# Patient Record
Sex: Male | Born: 1965 | Race: White | Hispanic: No | Marital: Married | Smoking: Light tobacco smoker
Health system: Southern US, Community
[De-identification: ages and names within clinical notes are randomized; demographics above are authoritative.]

## PROBLEM LIST (undated history)

## (undated) ENCOUNTER — Emergency Department: Admission: EM | Disposition: A | Discharge status: Still In Hospital | Payer: Self-pay

## (undated) DIAGNOSIS — E119 Type 2 diabetes mellitus without complications: Secondary | ICD-10-CM

## (undated) DIAGNOSIS — J302 Other seasonal allergic rhinitis: Secondary | ICD-10-CM

## (undated) DIAGNOSIS — N179 Acute kidney failure, unspecified: Secondary | ICD-10-CM

## (undated) DIAGNOSIS — Z452 Encounter for adjustment and management of vascular access device: Secondary | ICD-10-CM

## (undated) DIAGNOSIS — J329 Chronic sinusitis, unspecified: Secondary | ICD-10-CM

## (undated) DIAGNOSIS — G8929 Other chronic pain: Secondary | ICD-10-CM

## (undated) DIAGNOSIS — R519 Headache, unspecified: Secondary | ICD-10-CM

## (undated) DIAGNOSIS — E785 Hyperlipidemia, unspecified: Secondary | ICD-10-CM

## (undated) HISTORY — DX: Acute kidney failure, unspecified: N17.9

## (undated) HISTORY — DX: Type 2 diabetes mellitus without complications: E11.9

## (undated) HISTORY — DX: Headache, unspecified: R51.9

---

## 1997-10-27 DIAGNOSIS — Z8669 Personal history of other diseases of the nervous system and sense organs: Secondary | ICD-10-CM

## 1997-10-27 HISTORY — DX: Personal history of other diseases of the nervous system and sense organs: Z86.69

## 1998-10-27 DIAGNOSIS — G4730 Sleep apnea, unspecified: Secondary | ICD-10-CM

## 1998-10-27 HISTORY — DX: Sleep apnea, unspecified: G47.30

## 2013-09-30 ENCOUNTER — Other Ambulatory Visit: Payer: Self-pay

## 2013-09-30 ENCOUNTER — Inpatient Hospital Stay: Payer: BLUE CROSS/BLUE SHIELD | Admitting: Anesthesiology

## 2013-09-30 ENCOUNTER — Encounter: Payer: Self-pay | Admitting: Anesthesiology

## 2013-09-30 ENCOUNTER — Inpatient Hospital Stay: Payer: BLUE CROSS/BLUE SHIELD | Admitting: Critical Care Medicine

## 2013-09-30 ENCOUNTER — Encounter
Admission: AD | Disposition: A | Discharge status: Rehabilitation Facility | Payer: Self-pay | Source: Other Acute Inpatient Hospital | Attending: Critical Care Medicine

## 2013-09-30 ENCOUNTER — Ambulatory Visit: Payer: Self-pay

## 2013-09-30 ENCOUNTER — Ambulatory Visit: Payer: BLUE CROSS/BLUE SHIELD

## 2013-09-30 ENCOUNTER — Inpatient Hospital Stay
Admission: AD | Admit: 2013-09-30 | Discharge: 2013-10-07 | DRG: 023 | Disposition: A | Discharge status: Rehabilitation Facility | Payer: BLUE CROSS/BLUE SHIELD | Source: Other Acute Inpatient Hospital | Attending: Internal Medicine | Admitting: Internal Medicine

## 2013-09-30 DIAGNOSIS — G4733 Obstructive sleep apnea (adult) (pediatric): Secondary | ICD-10-CM | POA: Diagnosis present

## 2013-09-30 DIAGNOSIS — A4901 Methicillin susceptible Staphylococcus aureus infection, unspecified site: Secondary | ICD-10-CM | POA: Diagnosis present

## 2013-09-30 DIAGNOSIS — J322 Chronic ethmoidal sinusitis: Secondary | ICD-10-CM | POA: Diagnosis present

## 2013-09-30 DIAGNOSIS — G935 Compression of brain: Secondary | ICD-10-CM

## 2013-09-30 DIAGNOSIS — IMO0001 Reserved for inherently not codable concepts without codable children: Secondary | ICD-10-CM | POA: Diagnosis present

## 2013-09-30 DIAGNOSIS — G9340 Encephalopathy, unspecified: Secondary | ICD-10-CM

## 2013-09-30 DIAGNOSIS — E785 Hyperlipidemia, unspecified: Secondary | ICD-10-CM

## 2013-09-30 DIAGNOSIS — G936 Cerebral edema: Secondary | ICD-10-CM

## 2013-09-30 DIAGNOSIS — R001 Bradycardia, unspecified: Secondary | ICD-10-CM

## 2013-09-30 DIAGNOSIS — G911 Obstructive hydrocephalus: Secondary | ICD-10-CM | POA: Diagnosis present

## 2013-09-30 DIAGNOSIS — J0120 Acute ethmoidal sinusitis, unspecified: Secondary | ICD-10-CM | POA: Diagnosis present

## 2013-09-30 DIAGNOSIS — J9600 Acute respiratory failure, unspecified whether with hypoxia or hypercapnia: Secondary | ICD-10-CM

## 2013-09-30 DIAGNOSIS — R079 Chest pain, unspecified: Secondary | ICD-10-CM

## 2013-09-30 DIAGNOSIS — F17200 Nicotine dependence, unspecified, uncomplicated: Secondary | ICD-10-CM | POA: Diagnosis present

## 2013-09-30 DIAGNOSIS — J0121 Acute recurrent ethmoidal sinusitis: Secondary | ICD-10-CM

## 2013-09-30 DIAGNOSIS — G060 Intracranial abscess and granuloma: Principal | ICD-10-CM

## 2013-09-30 DIAGNOSIS — E119 Type 2 diabetes mellitus without complications: Secondary | ICD-10-CM

## 2013-09-30 DIAGNOSIS — Z880 Allergy status to penicillin: Secondary | ICD-10-CM

## 2013-09-30 DIAGNOSIS — G988 Other disorders of nervous system: Secondary | ICD-10-CM

## 2013-09-30 DIAGNOSIS — Z794 Long term (current) use of insulin: Secondary | ICD-10-CM

## 2013-09-30 HISTORY — DX: Other seasonal allergic rhinitis: J30.2

## 2013-09-30 HISTORY — PX: CRANIOTOMY, REMOVAL HEMATOMA, SUBDURAL: SHX3519

## 2013-09-30 HISTORY — DX: Type 2 diabetes mellitus without complications: E11.9

## 2013-09-30 LAB — URINALYSIS WITH MICROSCOPIC
Bilirubin, UA: NEGATIVE
Blood, UA: NEGATIVE
Glucose, UA: >1000 — AB
Ketones UA: 10
Leukocyte Esterase, UA: NEGATIVE
Nitrite, UA: NEGATIVE
Specific Gravity UA: >1.035 — AB (ref 1.001–1.035)
Urine pH: 7 (ref 5.0–8.0)
Urobilinogen, UA: NORMAL mg/dL

## 2013-09-30 LAB — CBC
Hematocrit: 44.8 % (ref 42.0–52.0)
Hgb: 15.1 g/dL (ref 13.0–17.0)
MCH: 29.8 pg (ref 28.0–32.0)
MCHC: 33.7 g/dL (ref 32.0–36.0)
MCV: 88.4 fL (ref 80.0–100.0)
MPV: 10.3 fL (ref 9.4–12.3)
Nucleated RBC: 0 (ref 0–1)
Platelets: 197 (ref 140–400)
RBC: 5.07 (ref 4.70–6.00)
RDW: 13 % (ref 12–15)
WBC: 15.92 — ABNORMAL HIGH (ref 3.50–10.80)

## 2013-09-30 LAB — CVOR1 AGNKC GL THGB CLWBL
Arterial Total CO2: 23.1 mEq/L — ABNORMAL LOW (ref 24.0–30.0)
Base Excess, Arterial: 0.1 mEq/L (ref <=2.0)
Calcium, Ionized: 2.28 mEq/L — ABNORMAL LOW (ref 2.30–2.58)
Chloride, WB: 107 mEq/L — ABNORMAL HIGH (ref 98–106)
HCO3, Arterial: 22.2 mEq/L — ABNORMAL LOW (ref 23.0–29.0)
Hematocrit Total Calculated: 48.6 % (ref 40.0–54.0)
Hemoglobin Total: 15.9 g/dL (ref 13.0–17.0)
O2 Sat, Arterial: 99.9 % (ref 95.0–100.0)
Temperature: 37
Whole Blood Glucose: 298 mg/dL — ABNORMAL HIGH (ref 70–100)
Whole Blood Potassium: 4.1 mEq/L (ref 3.5–5.3)
Whole Blood Sodium: 141 mEq/L (ref 136–146)
pCO2, Arterial: 30.5 — ABNORMAL LOW (ref 35.0–45.0)
pH, Arterial: 7.475 — ABNORMAL HIGH (ref 7.350–7.450)
pO2, Arterial: 258 — ABNORMAL HIGH (ref 80.0–90.0)

## 2013-09-30 LAB — BLOOD GAS, ARTERIAL
Arterial Total CO2: 21.5 mEq/L — ABNORMAL LOW (ref 24.0–30.0)
Base Excess, Arterial: 0 mEq/L (ref <=2.0)
FIO2: 100 %
HCO3, Arterial: 20.7 mEq/L — ABNORMAL LOW (ref 23.0–29.0)
Minute Ventilation: 12.7
O2 Sat, Arterial: 100 % (ref 95.0–100.0)
PEEP: 5
Pressure Control: 25
Rate: 18
Temperature: 37.5
Tidal vol.: 660
pCO2, Arterial: 25.3 — ABNORMAL LOW (ref 35.0–45.0)
pH, Arterial: 7.526 — ABNORMAL HIGH (ref 7.350–7.450)
pO2, Arterial: 271 — ABNORMAL HIGH (ref 80.0–90.0)

## 2013-09-30 LAB — BASIC METABOLIC PANEL
BUN: 24 mg/dL — ABNORMAL HIGH (ref 9.0–21.0)
CO2: 19 mEq/L — ABNORMAL LOW (ref 22–29)
Calcium: 9 mg/dL (ref 8.5–10.5)
Chloride: 101 mEq/L (ref 98–107)
Creatinine: 0.8 mg/dL (ref 0.7–1.3)
Glucose: 362 mg/dL — ABNORMAL HIGH (ref 70–100)
Potassium: 4.8 mEq/L (ref 3.5–5.1)
Sodium: 134 mEq/L — ABNORMAL LOW (ref 136–145)

## 2013-09-30 LAB — GLUCOSE WHOLE BLOOD - POCT
Whole Blood Glucose POCT: 316 mg/dL — ABNORMAL HIGH (ref 70–100)
Whole Blood Glucose POCT: 300 mg/dL — ABNORMAL HIGH (ref 70–100)

## 2013-09-30 LAB — CK: Creatine Kinase (CK): 54 U/L (ref 30–200)

## 2013-09-30 LAB — PT/INR
PT INR: 1 (ref 0.9–1.1)
PT: 12.9 (ref 12.6–15.0)

## 2013-09-30 LAB — TYPE AND SCREEN
AB Screen Gel: NEGATIVE
ABO Rh: A NEG

## 2013-09-30 LAB — TROPONIN I: Troponin I: 0.02 ng/mL (ref 0.00–0.09)

## 2013-09-30 LAB — GFR: EGFR: >60

## 2013-09-30 SURGERY — CRANIOTOMY, REMOVAL HEMATOMA, SUBDURAL
Anesthesia: Anesthesia General | Wound class: Dirty or Infected

## 2013-09-30 MED ORDER — LIDOCAINE-EPINEPHRINE 1 %-1:100000 IJ SOLN
INTRAMUSCULAR | Status: DC | PRN
Start: 2013-09-30 — End: 2013-09-30
  Administered 2013-09-30: 10 mL

## 2013-09-30 MED ORDER — SODIUM CHLORIDE 0.9 % IV SOLN
INTRAVENOUS | Status: DC | PRN
Start: 2013-09-30 — End: 2013-09-30

## 2013-09-30 MED ORDER — DEXAMETHASONE SODIUM PHOSPHATE 4 MG/ML IJ SOLN (WRAP)
6.0000 mg | Freq: Four times a day (QID) | INTRAMUSCULAR | Status: DC
Start: 2013-09-30 — End: 2013-10-02
  Administered 2013-09-30 – 2013-10-02 (×8): 6 mg via INTRAVENOUS
  Filled 2013-09-30 (×8): qty 2

## 2013-09-30 MED ORDER — VECURONIUM BROMIDE 10 MG IV SOLR
INTRAVENOUS | Status: DC | PRN
Start: 2013-09-30 — End: 2013-09-30
  Administered 2013-09-30 (×2): 10 mg via INTRAVENOUS

## 2013-09-30 MED ORDER — CHLORHEXIDINE GLUCONATE 0.12 % MT SOLN
15.0000 mL | Freq: Two times a day (BID) | OROMUCOSAL | Status: DC
Start: 2013-09-30 — End: 2013-10-03
  Administered 2013-10-01: 15 mL via OROMUCOSAL
  Filled 2013-09-30: qty 15

## 2013-09-30 MED ORDER — FENTANYL CITRATE 0.05 MG/ML IJ SOLN
INTRAMUSCULAR | Status: AC
Start: 2013-09-30 — End: ?
  Filled 2013-09-30: qty 4

## 2013-09-30 MED ORDER — SODIUM CHLORIDE 0.9 % IV SOLN
INTRAVENOUS | Status: DC
Start: 2013-09-30 — End: 2013-10-02

## 2013-09-30 MED ORDER — VECURONIUM BROMIDE 10 MG IV SOLR
INTRAVENOUS | Status: AC
Start: 2013-09-30 — End: ?
  Filled 2013-09-30: qty 10

## 2013-09-30 MED ORDER — SODIUM CHLORIDE 0.9 % IJ SOLN
INTRAMUSCULAR | Status: AC
Start: 2013-09-30 — End: ?
  Filled 2013-09-30: qty 10

## 2013-09-30 MED ORDER — SODIUM CHLORIDE 0.9 % IR SOLN
Status: DC | PRN
Start: 2013-09-30 — End: 2013-09-30
  Administered 2013-09-30: 2000 mL

## 2013-09-30 MED ORDER — PROPOFOL INFUSION 10 MG/ML
3.0000 mg/kg/h | INTRAVENOUS | Status: DC
Start: 2013-09-30 — End: 2013-10-01
  Administered 2013-10-01 (×2): 3 mg/kg/h via INTRAVENOUS
  Filled 2013-09-30 (×3): qty 100

## 2013-09-30 MED ORDER — SODIUM CHLORIDE 0.9 % IV MBP
2.0000 g | Freq: Three times a day (TID) | INTRAVENOUS | Status: DC
Start: 2013-09-30 — End: 2013-10-03
  Administered 2013-09-30 – 2013-10-03 (×8): 2 g via INTRAVENOUS
  Filled 2013-09-30 (×10): qty 2

## 2013-09-30 MED ORDER — SIMVASTATIN 40 MG PO TABS
40.0000 mg | ORAL_TABLET | Freq: Every day | ORAL | Status: DC
Start: 2013-09-30 — End: 2013-10-07
  Administered 2013-09-30 – 2013-10-07 (×8): 40 mg via OROGASTRIC
  Filled 2013-09-30 (×8): qty 1

## 2013-09-30 MED ORDER — VANCOMYCIN HCL 1000 MG IV SOLR
2250.0000 mg | Freq: Two times a day (BID) | INTRAVENOUS | Status: DC
Start: 2013-09-30 — End: 2013-10-03
  Administered 2013-09-30 – 2013-10-02 (×5): 2250 mg via INTRAVENOUS
  Filled 2013-09-30 (×6): qty 2250

## 2013-09-30 MED ORDER — SODIUM CHLORIDE 0.9 % IV SOLN
0.0000 [IU]/h | INTRAVENOUS | Status: DC
Start: 2013-09-30 — End: 2013-10-01
  Administered 2013-09-30 – 2013-10-01 (×2): 4 [IU]/h via INTRAVENOUS
  Administered 2013-10-01: 6 [IU]/h via INTRAVENOUS
  Filled 2013-09-30 (×2): qty 1

## 2013-09-30 MED ORDER — BACITRACIN 50000 UNITS IM SOLR
INTRAMUSCULAR | Status: DC | PRN
Start: 2013-09-30 — End: 2013-09-30
  Administered 2013-09-30: 50000 [IU]

## 2013-09-30 MED ORDER — PROPOFOL 10 MG/ML IV EMUL
INTRAVENOUS | Status: AC
Start: 2013-09-30 — End: ?
  Filled 2013-09-30: qty 100

## 2013-09-30 MED ORDER — PROPOFOL 10 MG/ML IV EMUL
INTRAVENOUS | Status: AC
Start: 2013-09-30 — End: 2013-09-30
  Administered 2013-09-30: 2.999 mg/kg/h via INTRAVENOUS
  Filled 2013-09-30: qty 100

## 2013-09-30 MED ORDER — ONDANSETRON HCL 4 MG/2ML IJ SOLN
4.0000 mg | Freq: Four times a day (QID) | INTRAMUSCULAR | Status: DC | PRN
Start: 2013-09-30 — End: 2013-10-07

## 2013-09-30 MED ORDER — NICARDIPINE IV BOLUS SYRINGE (ANESTHESIA)
INTRAVENOUS | Status: AC
Start: 2013-09-30 — End: ?
  Filled 2013-09-30: qty 5

## 2013-09-30 MED ORDER — AZTREONAM 1 G IJ SOLR
1.0000 g | Freq: Three times a day (TID) | INTRAMUSCULAR | Status: DC
Start: 2013-09-30 — End: 2013-09-30

## 2013-09-30 MED ORDER — DEXTROSE 50 % IV SOLN
50.0000 mL | INTRAVENOUS | Status: DC | PRN
Start: 2013-09-30 — End: 2013-10-07

## 2013-09-30 MED ORDER — VANCOMYCIN 1250 MG IN 250 ML NS (CNR)
1250.0000 mg | Freq: Two times a day (BID) | Status: DC
Start: 2013-09-30 — End: 2013-09-30

## 2013-09-30 MED ORDER — INSULIN REGULAR HUMAN 100 UNIT/ML IJ SOLN
0.0000 [IU] | Freq: Once | INTRAMUSCULAR | Status: AC
Start: 2013-09-30 — End: 2013-09-30
  Administered 2013-09-30: 5 [IU] via INTRAVENOUS
  Filled 2013-09-30: qty 10

## 2013-09-30 MED ORDER — METRONIDAZOLE IN NACL 500 MG/100 ML IV SOLN
500.0000 mg | Freq: Three times a day (TID) | INTRAVENOUS | Status: DC
Start: 2013-09-30 — End: 2013-10-03
  Administered 2013-09-30 – 2013-10-03 (×8): 500 mg via INTRAVENOUS
  Filled 2013-09-30 (×7): qty 100

## 2013-09-30 MED ORDER — FENTANYL CITRATE 0.05 MG/ML IJ SOLN
50.0000 ug | INTRAMUSCULAR | Status: DC | PRN
Start: 2013-09-30 — End: 2013-10-01
  Administered 2013-09-30 (×2): 50 ug via INTRAVENOUS
  Administered 2013-09-30: 100 ug via INTRAVENOUS
  Administered 2013-09-30 (×4): 50 ug via INTRAVENOUS

## 2013-09-30 MED ORDER — GELATIN ABSORBABLE 100 EX MISC
CUTANEOUS | Status: DC | PRN
Start: 2013-09-30 — End: 2013-09-30
  Administered 2013-09-30: 1 via TOPICAL

## 2013-09-30 MED ORDER — MICROFIBRILLAR COLL HEMOSTAT EX PADS
MEDICATED_PAD | CUTANEOUS | Status: DC | PRN
Start: 2013-09-30 — End: 2013-09-30
  Administered 2013-09-30: 1 via TOPICAL

## 2013-09-30 MED ORDER — PROPOFOL INFUSION 10 MG/ML
INTRAVENOUS | Status: DC | PRN
Start: 2013-09-30 — End: 2013-09-30
  Administered 2013-09-30: 50 mg via INTRAVENOUS

## 2013-09-30 MED ORDER — LUBRIFRESH P.M. OP OINT
TOPICAL_OINTMENT | OPHTHALMIC | Status: AC
Start: 2013-09-30 — End: ?
  Filled 2013-09-30: qty 3.5

## 2013-09-30 MED ORDER — THROMBIN 5000 UNITS EX SOLR
CUTANEOUS | Status: DC | PRN
Start: 2013-09-30 — End: 2013-09-30
  Administered 2013-09-30: 5000 [IU] via TOPICAL

## 2013-09-30 MED ORDER — PHENYLEPHRINE 100 MCG/ML IV BOLUS (ANESTHESIA)
PREFILLED_SYRINGE | INTRAVENOUS | Status: AC
Start: 2013-09-30 — End: ?
  Filled 2013-09-30: qty 5

## 2013-09-30 MED ORDER — PROPOFOL 10 MG/ML IV EMUL
INTRAVENOUS | Status: AC
Start: 2013-09-30 — End: ?
  Filled 2013-09-30: qty 20

## 2013-09-30 SURGICAL SUPPLY — 51 items
BLADE SURGICAL DISPOSABLE (Blade) ×2 IMPLANT
CATH BARDEX FOLEY 2WAY 16FR (Catheter Urine) ×2 IMPLANT
CLIP EXTERNAL CARTRIDGE SERRATE EDGE JAW (Clips) ×3
CLIP EXTERNAL CARTRIDGE SERRATE EDGE JAW HEAVY CLAMP FORCE ACRACLIP (Clips) ×3 IMPLANT
CLIP XTRN ACRACLIP STRL CRTDG SRR EDG (Clips) ×3
CONTAINER SPEC 8OZ NS SNPON LID TRNLU (Suction) ×2 IMPLANT
COTTON BALL STERILE .75IN (Dressing) ×2 IMPLANT
COTTONOID 1/2X1" 80-1402" (Dressing) ×2 IMPLANT
COTTONOID 1X1" 80-1403" (Dressing) ×2 IMPLANT
COVER BUR HL TI MATRIXNEURO 12MM CRNL (Cover) ×4 IMPLANT
COVER BURR HOLE CRANIAL OD12 MM SYNTHES (Cover) ×4 IMPLANT
COVER BURR HOLE CRANIAL OD12 MM TITANIUM BLUE (Cover) ×4 IMPLANT
DRAPE LIECA MICROSCOPE 54X150 (Drape) ×2 IMPLANT
DRESSING FLEXZAN 4X4 (Dressing) ×2 IMPLANT
ELECTRODE BLADE (Cautery) ×2 IMPLANT
ELECTRODE ELECTROSURGICAL BLADE L4 IN (Cautery) ×1
ELECTRODE ELECTROSURGICAL BLADE L4 IN OD3/32 IN EDGE L.2 IN INSULATE (Cautery) ×1 IMPLANT
ELECTRODE ESURG BLDE EDG 3/32IN 4IN LF (Cautery) ×1
GAUZE KERLIX 4.5X4YDS (Dressing) ×2 IMPLANT
GLOVE BIOGEL SURG PF SZ 8.5 (Glove) ×4 IMPLANT
GLOVE SRG NTR RBR 8 INDCTR BGL 299X103MM (Glove) ×2
GLOVE SURGICAL 8 INDICATOR BIOGEL POWDER (Glove) ×2
GLOVE SURGICAL 8 INDICATOR BIOGEL POWDER FREE SMOOTH BEAD CUFF (Glove) ×2 IMPLANT
GOWN SMART IMPERVIOUS LARGE (Gown) ×4 IMPLANT
KIT CRANIOTOMY FFX (Tray) ×2 IMPLANT
NEEDLE 25GA 1 1/2 (Needles) ×2 IMPLANT
NEEDLE REG BEVEL 19GX1.5IN (Needles) ×6 IMPLANT
PAD ELECTROSRG GRND REM W CRD (Procedure Accessories) ×2 IMPLANT
PATTIES 1X3 USE LAWSON 2442 (Sponge) ×2 IMPLANT
PATTIES COTTONOID 1/2X2 (Sponge) ×2 IMPLANT
SCREW BN TI MATRIXNEURO 1.5MM 2.5MM 4MM (Screw) ×20 IMPLANT
SCREW L4 MM OD1.5 MM ODSEC2.5 MM (Screw) ×20 IMPLANT
SCREW L4 MM OD1.5 MM ODSEC2.5 MM TITANIUM CRANIOMAXILLOFACIAL SELF (Screw) ×20 IMPLANT
SLEEVE SEQUEN COMP KNEE REG (Procedure Accessories) ×2 IMPLANT
SOL NACL INJ 0.9% 30ML BACTER (IV Solutions) ×2 IMPLANT
SOLUTION IRR 0.9% NACL 1000ML LF STRL (Irrigation Solutions) ×1
SOLUTION IRRIGATION 0.9% SODIUM CHLORIDE (Irrigation Solutions) ×1
SOLUTION IRRIGATION 0.9% SODIUM CHLORIDE 1000 ML PLASTIC POUR BOTTLE (Irrigation Solutions) ×1 IMPLANT
SPNG ABSORBABLE GELATIN (Hemostat) ×2 IMPLANT
SPONGE GAUZE 12PLY STRL 4X4IN (Sponge) ×2 IMPLANT
SUTURE ETHILON BLACK 3-0 PS-1 L18 IN (Suture) ×2
SUTURE ETHILON BLACK 3-0 PS-1 L18 IN MONOFILAMENT NONABSORBABLE (Suture) ×2 IMPLANT
SUTURE NABSB 3-0 PS1 ETH MTPS 18IN MFL (Suture) ×2
SUTURE VICRYL 3-0 CP2 8X18IN (Suture) ×12 IMPLANT
SYRINGE 20 ML BD LUER-LOK MEDICAL (Syringes, Needles) ×2 IMPLANT
SYRINGE MED 20ML LL LF STRL (Syringes, Needles) ×4
TOOL DISSECTING L9 CM ACORN FLUTE OD7.5 (Burr) ×1
TOOL DISSECTING L9 CM ACORN FLUTE OD7.5 MM MIDAS REX LEGEND (Burr) ×1 IMPLANT
TOOL DSCT ACRN MDSRX LGND 7.5MM 9CM STRL (Burr) ×1
TOWEL STERILE REUSABLE 8PK (Procedure Accessories) ×4 IMPLANT
TRAY PH SKIN SCRUB GEL TRAY (Tray) ×2 IMPLANT

## 2013-09-30 NOTE — Brief Op Note (Signed)
NEUROSURGERY BRIEF OP NOTE    Date Time: 09/30/2013 11:13 PM    Patient Name:   [name] Brown    Date of Operation:   09/30/2013    Providers Performing:   Surgeon(s):  Madaline Savage, MD    Assistant (s):    Operative Procedure:   Procedure(s):  CRANIOTOMY, REMOVAL HEMATOMA, SUBDURAL    Preoperative Diagnosis:   Pre-Op Diagnosis Codes:     * Abscess of brain [324.0]    Postoperative Diagnosis:   * No post-op diagnosis entered *    Anesthesia:   General    Estimated Blood Loss:        Implants:     Implant Name Type Inv. Item Serial No. Manufacturer Lot No. LRB No. Used Action   SCREW MATRX SLF DRLG TITN - [id number] Screw SCREW MATRX SLF DRLG TITN  SYNTH[employer]  N/A 20 Implanted   BUR HL COVER TITN - [id number] Cover BUR HL COVER TITN   SYNTH[employer] TRAUMA   N/A 4 Implanted       Drains:   Drains: none    Specimens:   Cultures for bacterial, fungal, TB  and permanent sent     SPECIMENS (last 24 hours)      Pathology Specimens     Row Name 09/30/13 2200             Additional Information    Send final report to: J. Georgi Tuel     Specimen Information    Specimen Testing Required Routine Pathology     Specimen ID  [id number]     Specimen Description RIGHT FRONTAL BRAIN ABSCESS WALL         Findings:   Yellowish purulent fluid drained and cultured.    Complications:   none    Condition:   stable      Signed by: Madaline Savage, MD                                                                              [facility]

## 2013-09-30 NOTE — Anesthesia Preprocedure Evaluation (Addendum)
Anesthesia Evaluation    AIRWAY           CARDIOVASCULAR    cardiovascular exam normal, regular and normal       DENTAL           PULMONARY    pulmonary exam normal and clear to auscultation     OTHER FINDINGS    Allergies:   -- Morphine    -- Penicillins   All Rx:  Scheduled Meds:     Continuous Infusions:     PRN Meds:.     Problem List:  Patient Active Problem List    Brain abscess         Date Noted: 09/30/2013      History:  Past Medical History:    Seasonal allergic rhinitis                                    Diabetes mellitus                                           No past surgical history on file.  Labs    WBC      15.92   09/30/2013  HGB       15.1   09/30/2013  HCT       44.8   09/30/2013  PLT        197   09/30/2013  NA         134   09/30/2013  K          4.8   09/30/2013  CL         101   09/30/2013  CO2         19   09/30/2013  CREAT      0.8   09/30/2013  BUN       24.0   09/30/2013  PT        12.9   09/30/2013  INR        1.0   09/30/2013  GLU        362   09/30/2013      Enzo Bi, MD                    Anesthesia Plan    ASA 3     general                     intravenous and inhalational induction   Detailed anesthesia plan: general endotracheal  Monitors/Adjuncts: arterial line    Post Op: ICU      informed consent obtained    Plan discussed with CRNA.

## 2013-09-30 NOTE — Consults (Signed)
NEUROSURGERY CONSULTATION    Date Time: 09/30/2013 5:43 PM  Patient Name: [name] Brown  Requesting Physician: Dinescu, Doristine Bosworth, MD  Consulting Physician: Dr. Vivia Budge  Covered By: Neurosurgery PA/pager [phone]          Reason for Consultation:   Large right frontal abscess    History:   [name] Brown is a 47 y.o. unknown handed male who presents to the hospital on 09/30/2013 with a large right frontal abscess with history of multiple sinus surgeries and recent sinus infection per report. Patient presented to [facility] today with complaints of AMS x 1 day. HCT obtained shows a large right frontal abscess with ventricular effacement and 2 cm anterior leftward midline shift. Neurosurgical consultation requested.   Patient was intubated prior to transport, limited neuro exam. Wife is en route. History obtained per report and review of [facility] ED records.    Past Medical History:   No past medical history on file.    Past Surgical History:   No past surgical history on file.    Family History:   No family history on file.    Social History:     History     Social History   . Marital Status: N/A     Spouse Name: N/A     Number of Children: N/A   . Years of Education: N/A     Social History Main Topics   . Smoking status: Not on file   . Smokeless tobacco: Not on file   . Alcohol Use: Not on file   . Drug Use: Not on file   . Sexually Active: Not on file     Other Topics Concern   . Not on file     Social History Narrative   . No narrative on file       Allergies:   Penicillins    Medications:     Current Facility-Administered Medications   Medication Dose Route Frequency   . aztreonam  1 g Intramuscular Q8H SCH   . dexamethasone  6 mg Intravenous Q6H SCH   . metroNIDAZOLE  500 mg Intravenous Q8H SCH   . vancomycin  1,250 mg Intravenous Q12H       Review of Systems:   A comprehensive review of systems was: unobtainable    Physical Exam:     Filed Vitals:    09/30/13 1738   SpO2: 100%       Intake and Output Summary (Last 24  hours) at Date Time  No intake or output data in the 24 hours ending 09/30/13 1743      Neuro exam:  Intubated, propofol held for exam  No eye opening  PERRL 3R brisk  Following commands BUE: grip, thumbs up bilaterally  BUE brisk localization  BLE brisk TF  Spontaneous extensor posturing BUE noted  Toes down-going bilaterally  Neg Clonus      GCS: E1 V1-T M6 = 8-T       Labs Reviewed:     No results found for this basename: WBC, HGB, HCT, MCV, PLT     No results found for this basename: NA, K, CL, CO2     No results found for this basename: INR, PT       Rads:   HCT obtained at [facility] - Large right frontal lesion measuring 4.7 x 5.9 cm with large amount of air and fluid layering suggestive of abscess with history of acute-on-chronic sinusitis and multiple prior sinus surgeries, cerebral edema with  anterior leftward midline shift of 2 cm with lateral ventricular effacement and uncal herniation.    Assessment:   47 yo male presented with AMS x 1 day found to have a large right frontal lesion measuring 4.7 x 5.9 cm with large amount of air and fluid layering suggestive of abscess with history of acute-on-chronic sinusitis and multiple prior sinus surgeries, cerebral edema with anterior leftward midline shift of 2 cm with lateral ventricular effacement and uncal herniation.    Plan:   Patient has been started on Flagyl, Vancomycin, Aztreonam, Decadron, received Mannitol 100 mg per [facility]  Dr. Jaynie Collins is aware of patient, recs pending  Pre-op labs ordered in anticipation of OR  NPO      Signed by: Karma Lew PA-C  Date/Time: 09/30/2013 5:43 PM      Further history obtained per discussion with Wife  Recently moved here from [place]. Last sinus surgery was performed in [place] in February 2014 due to chronic sinusitis.  Patient has history of chronic headaches. Complained of severe headache last Saturday 09/24/13. They presented to ED at that time, presumed sinusitis, prescribed Abx, steroid and pain medication.  Headache had improved on Sunday. He returned to work on Tuesday. Has complained of lethargy since.  Yesterday morning he noted difficulty walking which became progressively worse. AMS noted this morning.    ID has consulted, Aztreonam discontinued, changed to Cefepime  Urgent ENT consult requested  Plan for OR likely tonight, appreciate ENT input, may perform surgery with abscess drainage in conjunction with ENT pending their recs  Pre-op labs have been reviewed, T&S pending  Dr. Jaynie Collins will come evaluate patient in NSICU  Fine cut facial CT with coronal recon    Melanie Crazier, PA-C

## 2013-09-30 NOTE — Consults (Signed)
oCONSULTATION    Date Time: 09/30/2013 6:22 PM  Patient Name: [name]  Requesting Physician: Dinescu, Doristine Bosworth, MD       Reason for Consultation:   Brain abscess    Problem List:   Acute Problem List:   Brain abscess; CT with large  Frontal brain abscess with midline shift  Hx multiple sinus surgeries craniotomy in 2003; 2008 for sinusitis with purulence  Allergy PCN but not known by wife what type    Chronic:  DM  HLD  Sleep apnea      Assessment:   Large brain abscess; will need drainage and cultures; Neurosurgery to see    Recommendations:   Adjust antibiotics; Continue Vancomycin but stop Aztreonam; Start Cefepime 2 gm IV Q 8 hr; Continue Flagyl  Await Neurosurgery recommendations; likely grainage tonight    D/W wife at bedside  D/ W Dr Jearld Shines in NSICU    _______________________________________________________________________  I have discussed my thoughts with the patient and with relevant medical team members I have considered the potential drug interactions between antimicrobial agents I have recommended and other medications required by the patient and adjusted appropriately for renal function   Thank you for allowing me to participate in the care of this very interesting and pleasant patient    History:   [name] Brown is a 47 y.o. male who presents to the hospital on 09/30/2013 with a large frontal brain abscess. He was transferred from [facility] where he presented witn change in mental status , headache.  transferred to [facility] for surgical management and more intensive care.    Past Medical History:   No past medical history on file.    Past Surgical History:   No past surgical history on file.    Family History:   No family history on file.    Social History:     History     Social History   . Marital Status: N/A     Spouse Name: N/A     Number of Children: N/A   . Years of Education: N/A     Social History Main Topics   . Smoking status: Not on file   . Smokeless tobacco: Not on file   . Alcohol  Use: Not on file   . Drug Use: Not on file   . Sexually Active: Not on file     Other Topics Concern   . Not on file     Social History Narrative   . No narrative on file       Allergies:     Allergies   Allergen Reactions   . Morphine    . Penicillins        Lines:   No central lines    Medications:     Current Facility-Administered Medications   Medication Dose Route Frequency   . aztreonam  1 g Intramuscular Q8H SCH   . chlorhexidine  15 mL Mouth/Throat Q12H SCH   . dexamethasone  6 mg Intravenous Q6H SCH   . insulin regular  0-7 Units Intravenous Once   . metroNIDAZOLE  500 mg Intravenous Q8H   . simvastatin  40 mg per OG tube Daily with dinner   . vancomycin  1,250 mg Intravenous Q12H       Antibiotics:   #1 Vancomycin 1.25 gm IV Q 12 hr  #1 Flagyl 500 mg IV Q 8 hr  #1 Aztreonam 1 gm Iv Q 8 hr stop  #1 Cefepime 2 gm Iv Q  8 hr    Review of Systems:   Intubated; cannot obtain most history from chart and wife  NG tube  Mainly Headache/ lethargy/ change in behavior  No reported diarhea or abd pain  No dysuria  No cough/ SOB  No visual changes        Physical Exam:     Filed Vitals:    09/30/13 1738   SpO2: 100%       General Appearance: intubated; sedated  \HEENT: PERRL  Neck: supple, no significant adenopathy   Lungs: clear to auscultation, no wheezes, rales or rhonchi, symmetric air entry   Cardiac: normal rate, regular rhythm, normal S1, S2,   Abdomen: soft, non-tender, non-distended, normal active bowel sounds  Extremities: no pedal edema  Skin: no rash    Labs/Microbiology Reviewed:   WBC 15.92  BUN/ Cr nl    Rads:   Radiological Procedure reviewed.     Signed by: Dorena Cookey, MD

## 2013-09-30 NOTE — H&P (Signed)
Patient Type: I     ATTENDING PHYSICIAN: Leonette Monarch, MD     HISTORY OF PRESENT ILLNESS:  This is a 47 year old male sent from [facility] for brain abscess.   The patient has a past medical history of diabetes, multiple episodes of  sinus infection and sinus surgery, hyperlipidemia, and obstructive sleep  apnea.  He has been having sinus infection for the last couple of weeks,  and he has been treated as an outpatient with antibiotics and steroids.   Today, he presented to the outside hospital with changes in mental status  that started one day prior.  The patient was complaining of frontal  headache, not feeling well, seemed very fatigued.  He started becoming  increasingly lethargic with decreased responsiveness.  There are no reports  of any seizure activity as per the chart review.  Since in hospital, the  patient was intubated for airway protection.  A stat head CT shows a large  4 x 5 cm collection into the right frontal lobe with vasogenic edema, mass  effect about 2 cm of midline shift.  There is prominence of the temporal  course suggesting early mild hydrocephalus.  Also there is subfalcine  herniation and medial temporal lobe prominent suggesting early uncal  herniation.  There is compression of the suprasellar cistern.  There are no  signs of downward herniation.  The facial bone CT shows bilateral acute  ethmoidal sinusitis and fibrosis and chronic changes of the nose and  ethmoid and sphenoid sinuses, status post multiple infections and prior  surgeries.  There is destruction of the lamina papyracea.  The patient was  transferred to [facility] for further management.  The [facility]  service was consulted over the phone by the ER physician from the outside  hospital.  Due to the CAT scan findings, 100 g of mannitol and moderate  hyperventilation were recommended.  The patient already had received 1 dose  of vancomycin and aztreonam at the outside hospital.  We recommended the  addition of  Flagyl for extended anaerobic coverage.  The patient has  allergy to PENICILLIN.  The patient was transferred intubated and stable  hemodynamically to [facility].     REVIEW OF SYSTEMS:  Unavailable as the patient is intubated and encephalopathic.     PAST MEDICAL HISTORY:  As above.     PAST SURGICAL HISTORY:  As above, plus multiple sinus surgeries and question of resection of  frontal lobe during one of those surgeries.     ALLERGIES:  MORPHINE and PENICILLIN.     MEDICATIONS AT HOME:  Glyburide, Zocor, Cipro, dexamethasone 4 mg by mouth every 6 hours and  Percocet.     FAMILY HISTORY:  Noncontributory.     SOCIAL HISTORY:  He lives at home, married, no history of smoking, alcohol abuse, or IV drug  use.     PHYSICAL EXAMINATION:  GENERAL:  The patient intubated, off sedation, encephalopathic.  VITAL SIGNS:  Reviewed and stable.  He saturates 100% on 100% FIO2.    NEUROLOGIC:  The patient is not opening eyes to pain.  Pupils 3 to 4 mm and  reactive, positive cough, positive gag, positive corneals.  The patient is  following 2-step commands intermittently.  He is brisk localizing with left  upper extremity and less brisk with the right upper extremity.  He  withdraws to pain bilateral lower extremities.  HEAD AND NECK:  Thick neck, intubated, no JVD.  CARDIOVASCULAR:  Regular S1,  S2, no clicks or murmurs.  LUNGS:  Clear to auscultation.  ABDOMEN:  Soft, nontender, obese, distended, positive bowel sounds.  EXTREMITIES:  No edema, clubbing, or cyanosis.  SKIN:  No obvious rashes.     LABORATORY DATA:  Outside head CT was personally reviewed and reported as above.  The blood  work at [facility] is not available.  From the blood work  performed at the outside hospital, white count is 22.5.  Sodium is 136.   There is normal renal function and blood sugar was 368.  Chest x-ray  performed at [facility] shows no active infiltrate.     IMPRESSION AND PLAN:  1.  Right frontal brain  abscess with large amount of brain edema and  brain compression with subfalcine herniation and early uncal herniation.   The patient is admitted to neurologic intensive care unit.  We will start  him on steroids, Decadron 6 mg intravenous every 6 hours. We are going to  continue the broad-spectrum antibiotics, vancomycin, aztreonam, and Flagyl.   We will obtain ID consult.  Neurosurgery service was already consulted and  is evaluating the patient for abscess drainage.  Based on neurosurgery  recommendations the patient will need ENT evaluation for drainage of the  sphenoidale sinusitis.  We will keep the sodium within normal limits.   Blood pressure: less than 160, temperature less than 99.  2.  Cardiovascular:  The patient is stable hemodynamically.  Start him on  intravenous fluids.  We are going to rule out neurocardiogenic injury.  For  his hyperlipidemia, I will restart statin.  3.  Acute respiratory failure.  Check ABG and provide mild hyperventilation  to avoid worsening of the mass effect.  The goal pCO2 would be 30 to 35  until OR.   4.  Gastrointestinal:  Nothing by mouth and prophylaxis.  5.  Renal: Recheck BMP, daily sodium checks, keep sodium within normal  limits.  6.  Hematologic:  Follow up white count.  Check coagulations preoperative.  7.  Diabetes, uncontrolled start insulin drip.  8.  Deep venous thrombosis prophylaxis:  Sequential compression devices,  hold low molecular weight heparin due to possible emergent OR.  We are  going to resume 24 to 48 hours after the surgery.  9.  Disposition:  Full code, neurologic ICU admission for brain abscess,  brain edema, brain compression and encephalopathy, acute respiratory  failure, uncontrolled diabetes, life saving interventions, antibiotics,  steroids, mechanical ventilation and insulin drip.     Critical care time spent for this patient 75 minutes, excluding procedures.           D:  09/30/2013 18:13 PM by Dr. Doristine Bosworth. [place] MD ([id number])  T:   09/30/2013 19:25 PM by       Everlean Cherry: [id number]) (Doc ID: [id number])

## 2013-09-30 NOTE — Transfer of Care (Signed)
Anesthesia Transfer of Care Note    Patient: [name] Brown    Procedures performed: Procedure(s) with comments:  CRANIOTOMY, REMOVAL HEMATOMA, SUBDURAL -  BI-FRONTAL CRANIOTOMY, FOR  EVACUATION OF RIGHT FRONTAL ABSCESS    Anesthesia type: General ETT    Patient location:ICU    Last vitals:   Filed Vitals:    09/30/13 2350   BP: 120/65   Pulse: 79   Temp:    Resp: 18   SpO2: 98%       Post pain: intubated and sedated     Mental Status:unresponsive    Respiratory Function: intubated    Cardiovascular: stable    Nausea/Vomiting: intubated and sedated    Hydration Status: adequate    Post assessment: no apparent anesthetic complications and no reportable events

## 2013-09-30 NOTE — Consults (Signed)
Date Time: 09/30/2013 8:43 PM   Patient Name: [facility]   Requesting Physician: Dinescu, Doristine Bosworth, MD   Consulting Physician: Dr. Vivia Budge   Covered By: Neurosurgery PA/pager [phone]   Reason for Consultation:    Large right frontal abscess   History:    Patient is a 47 y.o. unknown handed male who was transferred from [facility] with a suspected large right frontal abscess. He has a history of multiple sinus surgeries and recent sinus infection per report. Patient presented to [facility] today with complaints of AMS x 1 day. HCT obtained shows a large right frontal abscess with ventricular effacement and 2 cm anterior leftward midline shift. Neurosurgical consultation requested.   Patient was intubated prior to transport, limited neuro exam. Wife is en route. History obtained per report and review of [facility] ED records.   Past Medical History:    No past medical history on file.   Past Surgical History:    No past surgical history on file.   Family History:    No family history on file.   Social History:      History      Social History    .  Marital Status:  N/A      Spouse Name:  N/A      Number of Children:  N/A    .  Years of Education:  N/A      Social History Main Topics    .  Smoking status:  Not on file    .  Smokeless tobacco:  Not on file    .  Alcohol Use:  Not on file    .  Drug Use:  Not on file    .  Sexually Active:  Not on file      Other Topics  Concern    .  Not on file      Social History Narrative    .  No narrative on file      Allergies:    Penicillins   Medications:      Current Facility-Administered Medications    Medication  Dose  Route  Frequency    .  aztreonam  1 g  Intramuscular  Q8H SCH    .  dexamethasone  6 mg  Intravenous  Q6H SCH    .  metroNIDAZOLE  500 mg  Intravenous  Q8H SCH    .  vancomycin  1,250 mg  Intravenous  Q12H      Review of Systems:    A comprehensive review of systems was: unobtainable        O:   Filed Vitals:    09/30/13 2000   BP: 126/76   Pulse: 70   Temp: 99.5  F (37.5 C)   Resp: 18   SpO2: 100%         PHYSICAL EXAM:  Intubated, propofol held for exam   No eye opening   PERRL 3R brisk   Following commands BUE: grip, thumbs up bilaterally   BUE brisk localization   BLE brisk TF   Spontaneous extensor posturing BUE noted   Toes down-going bilaterally   Neg Clonus      Head CT shows R anterior frontal lesion with air/fluid level and surrounding edema concerning for abscess. No frontal sinus is seen with metallic artifact just above the nasion.        A/P:   Neuro exam is compromised by intubation/sedation but appears to be nonfocal.  Given concerning CT  findings, however, we will proceed to OR.  I discussed case with Dr. Cheyenne Adas who is on call for ENT.  He agreed with my plan to proceed to OR.   He will see patient in the morning.   Consent was obtained from his wife.       Rella Larve, MD

## 2013-10-01 ENCOUNTER — Inpatient Hospital Stay: Payer: BLUE CROSS/BLUE SHIELD

## 2013-10-01 ENCOUNTER — Encounter: Payer: Self-pay | Admitting: Neurological Surgery

## 2013-10-01 DIAGNOSIS — G060 Intracranial abscess and granuloma: Principal | ICD-10-CM

## 2013-10-01 LAB — BLOOD GAS, ARTERIAL
Arterial Total CO2: 20.5 mEq/L — ABNORMAL LOW (ref 24.0–30.0)
Arterial Total CO2: 22.5 mEq/L — ABNORMAL LOW (ref 24.0–30.0)
Base Excess, Arterial: -1.1 mEq/L (ref <=2.0)
Base Excess, Arterial: -1.8 mEq/L (ref <=2.0)
FIO2: 50 %
FIO2: 40 %
HCO3, Arterial: 19.8 mEq/L — ABNORMAL LOW (ref 23.0–29.0)
HCO3, Arterial: 21.5 mEq/L — ABNORMAL LOW (ref 23.0–29.0)
Minute Ventilation: 12.7
Minute Ventilation: 12.7
O2 Sat, Arterial: 99.4 % (ref 95.0–100.0)
O2 Sat, Arterial: 99.4 % (ref 95.0–100.0)
PEEP: 5
PEEP: 5
Pressure Control: 23
Pressure Control: 23
Pressure Support: 5
Rate: 12
Temperature: 37.6
Temperature: 36.9
Tidal vol.: 660
Tidal vol.: 660
pCO2, Arterial: 24.7 — ABNORMAL LOW (ref 35.0–45.0)
pCO2, Arterial: 33.7 — ABNORMAL LOW (ref 35.0–45.0)
pH, Arterial: 7.516 — ABNORMAL HIGH (ref 7.350–7.450)
pH, Arterial: 7.42 (ref 7.350–7.450)
pO2, Arterial: 137 — ABNORMAL HIGH (ref 80.0–90.0)
pO2, Arterial: 157 — ABNORMAL HIGH (ref 80.0–90.0)

## 2013-10-01 LAB — GLUCOSE WHOLE BLOOD - POCT
Whole Blood Glucose POCT: 121 mg/dL — ABNORMAL HIGH (ref 70–100)
Whole Blood Glucose POCT: 124 mg/dL — ABNORMAL HIGH (ref 70–100)
Whole Blood Glucose POCT: 129 mg/dL — ABNORMAL HIGH (ref 70–100)
Whole Blood Glucose POCT: 171 mg/dL — ABNORMAL HIGH (ref 70–100)
Whole Blood Glucose POCT: 191 mg/dL — ABNORMAL HIGH (ref 70–100)
Whole Blood Glucose POCT: 235 mg/dL — ABNORMAL HIGH (ref 70–100)
Whole Blood Glucose POCT: 252 mg/dL — ABNORMAL HIGH (ref 70–100)
Whole Blood Glucose POCT: 281 mg/dL — ABNORMAL HIGH (ref 70–100)
Whole Blood Glucose POCT: 311 mg/dL — ABNORMAL HIGH (ref 70–100)

## 2013-10-01 LAB — BASIC METABOLIC PANEL
BUN: 25 mg/dL — ABNORMAL HIGH (ref 9.0–21.0)
CO2: 20 mEq/L — ABNORMAL LOW (ref 22–29)
Calcium: 8.5 mg/dL (ref 8.5–10.5)
Chloride: 110 mEq/L — ABNORMAL HIGH (ref 98–107)
Creatinine: 0.7 mg/dL (ref 0.7–1.3)
Glucose: 138 mg/dL — ABNORMAL HIGH (ref 70–100)
Potassium: 3.8 mEq/L (ref 3.5–5.1)
Sodium: 144 mEq/L (ref 136–145)

## 2013-10-01 LAB — GFR: EGFR: >60

## 2013-10-01 LAB — CBC
Hematocrit: 43 % (ref 42.0–52.0)
Hgb: 14.3 g/dL (ref 13.0–17.0)
MCH: 29.7 pg (ref 28.0–32.0)
MCHC: 33.3 g/dL (ref 32.0–36.0)
MCV: 89.4 fL (ref 80.0–100.0)
MPV: 10.5 fL (ref 9.4–12.3)
Nucleated RBC: 0 (ref 0–1)
Platelets: 374 (ref 140–400)
RBC: 4.81 (ref 4.70–6.00)
RDW: 13 % (ref 12–15)
WBC: 22.03 — ABNORMAL HIGH (ref 3.50–10.80)

## 2013-10-01 MED ORDER — INSULIN REGULAR HUMAN 100 UNIT/ML IJ SOLN
1.0000 [IU] | INTRAMUSCULAR | Status: DC | PRN
Start: 2013-10-01 — End: 2013-10-07
  Administered 2013-10-01: 7 [IU] via SUBCUTANEOUS
  Administered 2013-10-01: 10 [IU] via SUBCUTANEOUS
  Administered 2013-10-01 – 2013-10-02 (×5): 7 [IU] via SUBCUTANEOUS
  Administered 2013-10-03 (×2): 4 [IU] via SUBCUTANEOUS
  Administered 2013-10-03: 7 [IU] via SUBCUTANEOUS
  Administered 2013-10-03: 10 [IU] via SUBCUTANEOUS
  Administered 2013-10-04 (×4): 4 [IU] via SUBCUTANEOUS
  Administered 2013-10-05: 7 [IU] via SUBCUTANEOUS
  Administered 2013-10-05 (×2): 4 [IU] via SUBCUTANEOUS
  Administered 2013-10-06: 10 [IU] via SUBCUTANEOUS
  Administered 2013-10-06: 4 [IU] via SUBCUTANEOUS
  Administered 2013-10-06: 7 [IU] via SUBCUTANEOUS
  Administered 2013-10-06: 4 [IU] via SUBCUTANEOUS
  Filled 2013-10-01: qty 40
  Filled 2013-10-01 (×2): qty 70
  Filled 2013-10-01: qty 10
  Filled 2013-10-01: qty 100
  Filled 2013-10-01: qty 40
  Filled 2013-10-01: qty 70
  Filled 2013-10-01: qty 10
  Filled 2013-10-01: qty 40
  Filled 2013-10-01: qty 70
  Filled 2013-10-01: qty 40
  Filled 2013-10-01 (×2): qty 70
  Filled 2013-10-01: qty 40
  Filled 2013-10-01: qty 70
  Filled 2013-10-01: qty 10
  Filled 2013-10-01: qty 70
  Filled 2013-10-01: qty 40

## 2013-10-01 MED ORDER — GLUCOSE 40 % PO GEL
15.0000 g | ORAL | Status: DC | PRN
Start: 2013-10-01 — End: 2013-10-07

## 2013-10-01 MED ORDER — SENNOSIDES-DOCUSATE SODIUM 8.6-50 MG PO TABS
2.0000 | ORAL_TABLET | Freq: Every evening | ORAL | Status: DC
Start: 2013-10-01 — End: 2013-10-07
  Administered 2013-10-01 – 2013-10-06 (×6): 2 via ORAL
  Filled 2013-10-01 (×6): qty 2

## 2013-10-01 MED ORDER — HYDROMORPHONE HCL PF 1 MG/ML IJ SOLN
1.0000 mg | INTRAMUSCULAR | Status: DC | PRN
Start: 2013-10-01 — End: 2013-10-04
  Administered 2013-10-01 – 2013-10-04 (×15): 1 mg via INTRAVENOUS
  Filled 2013-10-01 (×15): qty 1

## 2013-10-01 MED ORDER — FUROSEMIDE 10 MG/ML IJ SOLN
20.0000 mg | Freq: Once | INTRAMUSCULAR | Status: AC
Start: 2013-10-01 — End: 2013-10-01

## 2013-10-01 MED ORDER — HYDROMORPHONE HCL PF 1 MG/ML IJ SOLN
0.5000 mg | INTRAMUSCULAR | Status: DC | PRN
Start: 2013-10-01 — End: 2013-10-01
  Administered 2013-10-01: 0.5 mg via INTRAVENOUS
  Filled 2013-10-01: qty 1

## 2013-10-01 MED ORDER — FENTANYL CITRATE 0.05 MG/ML IJ SOLN
50.0000 ug | INTRAMUSCULAR | Status: DC | PRN
Start: 2013-10-01 — End: 2013-10-02
  Administered 2013-10-01 (×7): 50 ug via INTRAVENOUS
  Filled 2013-10-01 (×7): qty 2

## 2013-10-01 MED ORDER — FAMOTIDINE 10 MG/ML IV SOLN (WRAP)
20.0000 mg | Freq: Two times a day (BID) | INTRAVENOUS | Status: DC
Start: 2013-10-01 — End: 2013-10-03
  Administered 2013-10-01 – 2013-10-02 (×5): 20 mg via INTRAVENOUS
  Filled 2013-10-01 (×5): qty 2

## 2013-10-01 MED ORDER — DEXTROSE 50 % IV SOLN
25.0000 mL | INTRAVENOUS | Status: DC | PRN
Start: 2013-10-01 — End: 2013-10-07

## 2013-10-01 MED ORDER — SALINE SPRAY 0.65 % NA SOLN
2.0000 | NASAL | Status: DC | PRN
Start: 2013-10-01 — End: 2013-10-07
  Filled 2013-10-01: qty 44

## 2013-10-01 MED ORDER — FUROSEMIDE 10 MG/ML IJ SOLN
INTRAMUSCULAR | Status: AC
Start: 2013-10-01 — End: 2013-10-01
  Administered 2013-10-01: 20 mg via INTRAVENOUS
  Filled 2013-10-01: qty 4

## 2013-10-01 MED ORDER — GLUCAGON HCL (RDNA) 1 MG IJ SOLR
1.0000 mg | INTRAMUSCULAR | Status: DC | PRN
Start: 2013-10-01 — End: 2013-10-07

## 2013-10-01 MED ORDER — HYDROMORPHONE HCL 1 MG/ML PO LIQD
1.0000 mg | Freq: Once | ORAL | Status: AC
Start: 2013-10-01 — End: 2013-10-01
  Administered 2013-10-01: 1 mg via ORAL

## 2013-10-01 NOTE — Plan of Care (Signed)
Patient went to OR 09/30/13 for frontal craniotomy abscess evacuation.      Neuro:  Off propofol when he returned from the OR:  Pupils 2/3 round and brisk bilaterally.  Positive gag, cough, corneal reactions. Localized to painful stimuli with bilateral upper extremities.  Withdraws with bilateral lower extremities.  At 0750, with propofol off for 20 minutes, patient followed commands with all extremities.  Moving thumbs and toes when asked.  Weak grip on left upper extremity.    CV:  NSR with HR consistently in the 70's.  TMax 99.7.    Respiratory:  Ventilator at this time with propofol sedation.      GI:  NPO    GU:  Foley, urine output q1h, 35-50 mL, clear/green tint.    Skin:  Scattered bruising from neuro assessments, otherwise intact.

## 2013-10-01 NOTE — Progress Notes (Signed)
Neurosurgery Progress Note  POD 1  S:   Doing better    O:   Filed Vitals:    10/01/13 1200   BP:    Pulse: 72   Temp:    Resp: 15   SpO2: 97%         PHYSICAL EXAM:  Extubated  Awake  Conversant  Dressing clean and intact  Moves all extremities well  Follows commands        A/P:   Neuro improved  Follow cultures  Antibiotics per ID      Rella Larve, MD

## 2013-10-01 NOTE — Consults (Signed)
Pt is a 7y M with hx of chronic sinusitis and underdeveloped sinuses who has had 2 FESS in the past.  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  Extubated this AM.        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  May require endonasal skull base repair at a later time.  Discussed case with Dr. Juanda Chance who will see pt this afternoon.  Anticipate referral to Dr. Thedore Mins for follow up.     Medications:     Current Facility-Administered Medications   Medication Dose Route Frequency   . cefepime  2 g Intravenous Q8H SCH   . chlorhexidine  15 mL Mouth/Throat Q12H SCH   . dexamethasone  6 mg Intravenous Q6H SCH   . famotidine  20 mg Intravenous Q12H SCH   . furosemide       . furosemide  20 mg Intravenous Once   . [COMPLETED] insulin regular  0-7 Units Intravenous Once   . metroNIDAZOLE  500 mg Intravenous Q8H   . simvastatin  40 mg per OG tube Daily with dinner   . vancomycin  2,250 mg Intravenous Q12H   . [DISCONTINUED] aztreonam  1 g Intramuscular Q8H SCH   . [DISCONTINUED] vancomycin  1,250 mg Intravenous Q12H       Review of Systems:   A comprehensive review of systems was: History obtained from wife  ENT ROS: positive for - headaches, nasal congestion and nasal discharge    Physical Exam:     Filed Vitals:    10/01/13 0904   BP:    Pulse:    Temp:    Resp:    SpO2: 98%       Intake and Output Summary (Last 24 hours) at Date Time    Intake/Output Summary (Last 24 hours) at 10/01/13 1610  Last data filed at 10/01/13 0800   Gross per 24 hour   Intake 2669.4 ml   Output   2855 ml   Net -185.6 ml       General appearance - alert, well appearing, and in no distress  and oriented to person, place, and time  Mental status - drowsy  Nose - normal and patent, no erythema, discharge or polyps  Mouth - mucous membranes moist, pharynx normal without lesions  Neck - supple, no significant adenopathy    Labs:     Results     Procedure Component Value Units Date/Time    AFB culture and smear [[id number]] Collected:09/30/13 2230    Specimen Information:Sputum / Abscess Updated:10/01/13 0933    Narrative:    ORDER#: [id number]                                    ORDERED BY: LIM, JAE  SOURCE: Abscess brain                                COLLECTED:  09/30/13 22:30  ANTIBIOTICS AT COLL.:  RECEIVED :  10/01/13 00:45  Stain, Acid Fast                           FINAL       10/01/13 09:33  10/01/13   No Acid Fast Bacillus Seen  Culture Acid Fast Bacillus (AFB)           PENDING      AFB culture and smear [[id number]] Collected:09/30/13 2230    Specimen Information:Sputum / Tissue Updated:10/01/13 0933    Narrative:    ORDER#: [id number]                                    ORDERED BY: LIM, JAE  SOURCE: Tissue right frontal brain abscess wal       COLLECTED:  09/30/13 22:30  ANTIBIOTICS AT COLL.:                                RECEIVED :  10/01/13 00:42  Stain, Acid Fast                           FINAL       10/01/13 09:33  10/01/13   No Acid Fast Bacillus Seen  Culture Acid Fast Bacillus (AFB)           PENDING      Culture Blood, Aerobic [[id number]] Collected:10/01/13 0834    Specimen Information:Blood / Blood, Venipuncture Updated:10/01/13 0931    Narrative:    8ml required    Culture Blood, Aerobic [[id number]] Collected:10/01/13 0834    Specimen Information:Blood / Blood, Venipuncture Updated:10/01/13 0931    Narrative:    8ml required    Respiratory Culture [[id number]] Collected:10/01/13 0834    Specimen Information:Sputum / Sputum, Suctioned Updated:10/01/13 0855    Glucose Whole Blood - POCT [[id number]]  (Abnormal) Collected:10/01/13 0755     POCT - Glucose Whole  blood 121 (H) mg/dL IRJJOAC:16/60/63 0160    Glucose Whole Blood - POCT [[id number]]  (Abnormal) Collected:10/01/13 0649     POCT - Glucose Whole blood 124 (H) mg/dL DUKGURK:27/06/23 7628    Glucose Whole Blood - POCT [[id number]]  (Abnormal) Collected:10/01/13 0542     POCT - Glucose Whole blood 129 (H) mg/dL VPXTGGY:69/48/54 6270    Blood gas, arterial [[id number]]  (Abnormal) Collected:10/01/13 0521    Specimen Information:Blood, Arterial Updated:10/01/13 0543     pH, Arterial 7.420      pCO2, Arterial 33.7 (L)      pO2, Arterial 157.0 (H)      HCO3, Arterial 21.5 (L) mEq/L      Arterial Total CO2 22.5 (L) mEq/L      Base Excess, Arterial -1.8 mEq/L      O2 Sat, Arterial 99.4 %      ABG CollectionSite Right Radl      Temperature 36.9      FIO2 40 %      O2 Delivery Ventilator      Rate 12      Mode: PRVC      Pressure Control 23      Minute Ventilation 12.7      PEEP 5      Tidal vol. 660     Basic Metabolic Panel [[id number]]  (Abnormal) Collected:10/01/13 0347    Specimen Information:Blood Updated:10/01/13  0529     Glucose 138 (H) mg/dL      BUN 91.4 (H) mg/dL      Creatinine 0.7 mg/dL      CALCIUM 8.5 mg/dL      Sodium 782 mEq/L      Potassium 3.8 mEq/L      Chloride 110 (H) mEq/L      CO2 20 (L) mEq/L     GFR [[id number]] Collected:10/01/13 0347     EGFR >60.0 Updated:10/01/13 0529    CBC without differential [[id number]]  (Abnormal) Collected:10/01/13 0347    Specimen Information:Blood / Blood Updated:10/01/13 0455     WBC 22.03 (H)      RBC 4.81      Hgb 14.3 g/dL      Hematocrit 52.8 %      MCV 89.4 fL      MCH 29.7 pg      MCHC 33.3 g/dL      RDW 13 %      Platelets 374      MPV 10.5 fL      Nucleated RBC 0     Glucose Whole Blood - POCT [[id number]]  (Abnormal) Collected:10/01/13 0242     POCT - Glucose Whole blood 171 (H) mg/dL UVOZDGU:44/03/47 4259    Wound culture & gram stain [[id number]] Collected:09/30/13 2230    Specimen Information:Wound / Tissue Updated:10/01/13 0153    Narrative:    ORDER#: [id number]                                     ORDERED BY: LIM, JAE  SOURCE: Tissue right frontal brain abscess wal       COLLECTED:  09/30/13 22:30  ANTIBIOTICS AT COLL.:                                RECEIVED :  10/01/13 00:42  Stain, Gram                                FINAL       10/01/13 01:53  10/01/13   Moderate WBCs             No Squamous epithelial cells seen             No organisms seen  Culture Wound                              PENDING      Wound culture & gram stain [[id number]] Collected:09/30/13 2230    Specimen Information:Wound / Abscess Updated:10/01/13 0151    Narrative:    ORDER#: [id number]                                    ORDERED BY: LIM, JAE  SOURCE: Abscess brain                                COLLECTED:  09/30/13 22:30  ANTIBIOTICS AT COLL.:  RECEIVED :  10/01/13 00:46  Stain, Gram                                FINAL       10/01/13 01:51  10/01/13   Many WBCs             No Squamous epithelial cells seen             Few Gram negative rods  Culture Wound                              PENDING      Blood gas, arterial [[id number]]  (Abnormal) Collected:10/01/13 0121    Specimen Information:Blood, Arterial Updated:10/01/13 0149     pH, Arterial 7.516 (H)      pCO2, Arterial 24.7 (L)      pO2, Arterial 137.0 (H)      HCO3, Arterial 19.8 (L) mEq/L      Arterial Total CO2 20.5 (L) mEq/L      Base Excess, Arterial -1.1 mEq/L      O2 Sat, Arterial 99.4 %      ABG CollectionSite Right Radl      Temperature 37.6      FIO2 50 %      O2 Delivery Ventilator      Mode: PRVC      Pressure Control 23      Minute Ventilation 12.7      PEEP 5      Pressure Support 5      Tidal vol. 660     Glucose Whole Blood - POCT [[id number]]  (Abnormal) Collected:10/01/13 0142     POCT - Glucose Whole blood 191 (H) mg/dL OZHYQMV:78/46/96 2952    Anaerobic culture [[id number]] Collected:09/30/13 2230    Specimen Information:Other / Abscess Updated:10/01/13 0046    Fungus culture [[id number]]  Collected:09/30/13 2230    Specimen Information:Other / Fluid Updated:10/01/13 0045    Anaerobic culture [[id number]] Collected:09/30/13 2230    Specimen Information:Other / Tissue Updated:10/01/13 0042    Fungus culture [[id number]] Collected:09/30/13 2230    Specimen Information:Other / Tissue Updated:10/01/13 0042    Glucose Whole Blood - POCT [[id number]]  (Abnormal) Collected:10/01/13 0002     POCT - Glucose Whole blood 235 (H) mg/dL YSAYTKZ:60/10/93 2355    CVOR1 AGNKC GL THGB CLWBL [[id number]]  (Abnormal) Collected:09/30/13 2212     pH, Arterial 7.475 (H) Updated:09/30/13 2223     pCO2, Arterial 30.5 (L)      pO2, Arterial 258.0 (H)      HCO3, Arterial 22.2 (L) mEq/L      Arterial Total CO2 23.1 (L) mEq/L      Base Excess, Arterial 0.1 mEq/L      O2 Sat, Arterial 99.9 %      ABG CollectionSite Other      Temperature 37.0      FIO2 OR %      Sodium Whole Blood 141 mEq/L      Potassium Whole Blood 4.1 mEq/L      Calcium, Ionized 2.28 (L) mEq/L      Glucose Whole Blood 298 (H) mg/dL      Chloride, WB 706 (H) mEq/L      Total Hemoglobin 15.9 g/dL      Total Hematocrit Calculated 48.6 %     MRSA culture [[id number]] Collected:09/30/13 1823    Specimen Information:Body Fluid /  Nasal/Throat Swab-ASC ICU Admission Updated:09/30/13 2044    Blood gas, arterial [[id number]]  (Abnormal) Collected:09/30/13 2014    Specimen Information:Blood, Arterial Updated:09/30/13 2027     pH, Arterial 7.526 (H)      pCO2, Arterial 25.3 (L)      pO2, Arterial 271.0 (H)      HCO3, Arterial 20.7 (L) mEq/L      Arterial Total CO2 21.5 (L) mEq/L      Base Excess, Arterial 0.0 mEq/L      O2 Sat, Arterial 100.0 %      ABG CollectionSite Right Radl      Temperature 37.5      FIO2 100 %      Rate 18      Mode: PRVC      Pressure Control 25      Minute Ventilation 12.7      PEEP 5      Tidal vol. 660     Urinalysis with microscopic [[id number]]  (Abnormal) Collected:09/30/13 1953    Specimen Information:Urine Updated:09/30/13 2016     Urine Type  Catheterized, F      Color, UA Yellow      Clarity, UA Clear      Specific Gravity UA >1.035 (A)      Urine pH 7.0      Leukocyte Esterase, UA Negative      Nitrite, UA Negative      Protein, UR Trace (A)      Glucose, UA >1000 (A)      Ketones UA 10      Urobilinogen, UA Normal mg/dL      Bilirubin, UA Negative      Blood, UA Negative      RBC, UA 0 - 5      WBC, UA 0 - 5     Glucose Whole Blood - POCT [[id number]]  (Abnormal) Collected:09/30/13 2007     POCT - Glucose Whole blood 300 (H) mg/dL YQMVHQI:69/62/95 2841    Type and Screen [[id number]] Collected:09/30/13 1824    Specimen Information:Blood Updated:09/30/13 1941     ABO Rh A NEG      AB Screen Gel NEG     Troponin I [[id number]] Collected:09/30/13 1823    Specimen Information:Blood Updated:09/30/13 1859     Troponin I 0.02 ng/mL     Creatine Kinase (CK) [[id number]] Collected:09/30/13 1824    Specimen Information:Blood Updated:09/30/13 1852     Creatine Kinase (CK) 54 U/L     Basic Metabolic Panel [[id number]]  (Abnormal) Collected:09/30/13 1824    Specimen Information:Blood Updated:09/30/13 1851     Glucose 362 (H) mg/dL      BUN 51.8 (H) mg/dL      Creatinine 0.8 mg/dL      CALCIUM 9.0 mg/dL      Sodium 841 (L) mEq/L      Potassium 4.8 mEq/L      Chloride 101 mEq/L      CO2 19 (L) mEq/L     GFR [[id number]] Collected:09/30/13 1824     EGFR >60.0 Updated:09/30/13 1851    Prothrombin time/INR [[id number]] Collected:09/30/13 1824    Specimen Information:Blood Updated:09/30/13 1846     PT 12.9      PT INR 1.0      PT Anticoag. Given Within 48 hrs. None     Glucose Whole Blood - POCT [[id number]]  (Abnormal) Collected:09/30/13 1834     POCT - Glucose Whole blood 316 (H) mg/dL CWCBJSE:83/15/17 6160    CBC without differential [[id number]]  (  Abnormal) Collected:09/30/13 1824    Specimen Information:Blood / Blood Updated:09/30/13 1840     WBC 15.92 (H)      RBC 5.07      Hgb 15.1 g/dL      Hematocrit 16.1 %      MCV 88.4 fL      MCH 29.8 pg      MCHC 33.7 g/dL      RDW  13 %      Platelets 197      MPV 10.3 fL      Nucleated RBC 0           Recent CBC   Recent Labs   Basename 10/01/13 0347    RBC 4.81    HGB 14.3    HCT 43.0    MCV 89.4    MCH 29.7    MCHC 33.3    RDW 13    MPV 10.5    LABPLAT --       Rads:   Radiological Procedure reviewed.    Signed by: Dillon Bjork

## 2013-10-01 NOTE — SLP Eval Note (Signed)
[facility]   Speech and Language Therapy Bedside Swallow Evaluation     Patient: [name] Brown    MRN#: [id number]     Consult received for [name] Brown for SLP Bedside Swallow Evaluation and Treatment.    Plan/Recommendations:   Diet Solid Recommendations: dysphagia/mechanically altered   Liquid Recommendations:  thin consistency  Administration of Medications: crushed;with liquid;with puree  Precautions/Compensations: Awake/alert;Upright 90 degrees for all oral intake;Eat/feed slowly  Aspiration Precautions posted at bedside: yes     Follow up treatments: diet tolerance monitoring;strategies training  Recommendation Discussed With: : Patient;Caregiver;Nurse;Posted bedside       SLP Frequency Recommended: 3x per week  SLP - Next Visit Recommended: 10/03/13    Assessment:   Pt presents with mild oropharyngeal dysphagia characterized by delayed oral manipulation and A/P transit with delayed swallow initiation, but once elicited, no s/s of aspiration and/or penetration. Laryngeal elevation adequate on palpation and voice remained clear throughout. Decreased ROM of oral articulators with low volume noted, but pt just had surgery and was recently extubated which contributed to fatigue. SLP will follow for swallow and will complete Speech/language eval.     History of Present Illness:   [name] Brown is a  31y M with hx of chronic sinusitis and underdeveloped sinuses who has had 2 FESS in the past. 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 Extubated this AM.     Medical Diagnosis: Abscess of brain [324.0]  Brain abscess [324.0] (Brain abscess)    Past Medical/Surgical History:  diabetes, multiple episodes of sinus infection and sinus surgery, hyperlipidemia, and obstructive sleep apnea.    SOCIAL HISTORY:   He lives at home, married, no history of smoking, alcohol abuse, or IV drug    use.    History/Current Status:  Respiratory Status: O2 via nasal cannula  Date extubated: 10/01/13  Behavior/Mental Status: Awake/alert;Able to follow directions;Cooperative;Requires prompting (delayed initiation; slow to respond. )  Nutrition: NPO    Subjective:   Patient is agreeable to participation in the therapy session. Family and/or guardian are agreeable to patient's participation in the therapy session. Nursing clears patient for therapy. Patient's medical condition is appropriate for Speech therapy intervention at this time.      Objective:   Observation of Patient/Vital Signs:  Patient is in bed with peripheral IV, O2 at 4 liters/minute via nasal cannula in place.    Oral Motor Skills:  Engineer, maintenance (IT) Skills: exceptions to [facility]  Oral Motor Impairments: ROM;strength (general weakness; surgery in early AM. Low volume)    Deglutition Skills:  Deglutition Skills  Position: upright 90 degrees  Food(s) Tested: ice chips;thin liquid;puree;solid  Oral Stage: chewing reduced, slow but effective;AP propulsion reduced  Pharyngeal Stage: delayed response;adequate  Esophageal Stage: appears within functional limits    Goals:  Pt will tolerate a dysphagia mech altered  diet with thin liquids with no s/s of aspiration x3-5 days.   Pt will masticate and clear regular trials with no s/s of aspiration 5/5xs.     Time of Treatment:  SLP Received On: 10/01/13  Start Time: 1135  Stop Time: 1155  Time Calculation (min): 20 min  Merton Border, M.A., CCC-SLP [phone]

## 2013-10-01 NOTE — Anesthesia Postprocedure Evaluation (Signed)
Anesthesia Post Evaluation    Patient: [name] Brown    Procedures performed: Procedure(s) with comments:  CRANIOTOMY, REMOVAL HEMATOMA, SUBDURAL -  BI-FRONTAL CRANIOTOMY, FOR  EVACUATION OF RIGHT FRONTAL ABSCESS    Anesthesia type: General ETT    Patient location:ICU    Last vitals:   Filed Vitals:    10/01/13 0700   BP: 117/63   Pulse: 72   Temp:    Resp: 12   SpO2: 99%       Post pain: Patient not complaining of pain, continue current therapy      Mental Status:sedated    Respiratory Function: intubated with 40% FiO2    Cardiovascular: stable    Nausea/Vomiting: patient not complaining of nausea or vomiting    Hydration Status: adequate    Post assessment: no apparent anesthetic complications

## 2013-10-01 NOTE — Op Note (Signed)
Procedure Date: 09/30/2013     Patient Type: I     SURGEON: Rella Larve MD  ASSISTANT:       PREOPERATIVE DIAGNOSES:  1.  Intracerebral abscess.  2.  Chronic sinusitis.     POSTOPERATIVE DIAGNOSES:  1.  Intracerebral abscess.  2.  Chronic sinusitis.     TITLE OF PROCEDURE:  1.  Reexploration of bicoronal exposure for craniotomy for evacuation of  right frontal intracerebral abscess.  2.  Intraoperative magnification.     ANESTHESIA:  General, local.     INDICATIONS:  The patient is a 47 year old male who presents with an acute change in  mental status to an outside hospital.  He was intubated and transferred to  [facility] for neurosurgical intervention after head CT scan showed  evidence of an intracerebral abscess with edema and mass effect.  The  patient had a history of chronic sinusitis with several surgeries in the  past.  Imaging studies revealed prior surgery at the anterior fossa floor  including probably colonization of his frontal sinus.  However, there is no  evidence of a craniotomy even though he did have a bicoronal incision.   Given his CT scan findings and neurologic examination, it was felt that  emergent operative intervention was indicated.  Risks, benefits, and  alternatives were discussed with the patient's wife, and she agreed to  proceed with surgery.  The case was also discussed with the ENT attending  on-call.     DESCRIPTION OF PROCEDURE:  After informed consent was obtained, the patient was emergently taken to  the operating room.  After general anesthesia was induced, he was placed on  the operating room table.  His head was placed in a neutral position in 3  point fixation using a [place] head holder.  Hair was shaven in order to  expose his prior bicoronal incision.  This was prepped and draped in the  usual sterile fashion.  His bicoronal incision was then reopened sharply.   Raney clips were used for galeal bleeding.  Temporalis muscle and fascia  were preserved.  I then  advanced the scalp flap with a 10 blade anteriorly  in order to dissect out the underlying pericranium.       Once the flap was exposed and held reflected anteriorly, the pericranium  was cut and dissected in a subperiosteal fashion and also held reflected  anteriorly.  We then used a high-speed drill to turn a right-sided frontal  bone flap.  Once the bone flap was lifted off, the dura was opened over the  anterior pole.  Underlying brain was extremely swollen and edematous.  The  corticectomy was then made in the anterior pole, and after dissection of  approximately 2 cm, we encountered the abscess cavity and yellowish  purulent material expressed itself under pressure.  This was cultured as  well as the surrounding abscess cavity.  It was sent for permanent and  culture as well.       After the purulent material was fully irrigated copiously, underlying brain  parenchyma became more relaxed.  Hemostasis was confirmed.  Because  overlying dura was friable, we laid the pericranial flap over the dural  opening and placed some retention sutures.  Bone flap was then resecured  using Synthes titanium mini plates.  Central tack ups were placed as well.   The scalp flap was then closed in layers.  Sterile dressings were applied.   The patient was kept intubated and  transferred to the neuro ICU in stable  condition.     ESTIMATED BLOOD LOSS:  100 mL.     SPECIMEN:    Sent include bacterial cultures as well as TB and fungal.           D:  10/01/2013 13:16 PM by Dr. Rella Larve, MD ([id number])  T:  10/01/2013 15:49 PM by       Everlean Cherry: [id number]) (Doc ID: [id number])

## 2013-10-01 NOTE — Progress Notes (Signed)
10/01/2013 7:45 AM  Brown,[name]      Updated problem List   Acute Problem List:   Frontal brain abscess with midline shift   --drainage of brain abscess 09/30/13  -------------------------------------------  Hx multiple sinus surgeries craniotomy in 2003; 2008 for sinusitis with purulence   Allergy PCN   --------------------------------------------  Chronic:   DM   HLD   Sleep apnea    Assessment:   Just 10 hours post drainage of brain abscess.  WBCs noted on gram stain with one sample showing gram negative rods.  This could be aerobic or anaerobic.  On good empiric IV antibiotic levels.  Good kidney function.  Vancomycin dosed aggressively.  Will want to watch his levels closely.  Leucocytosis post op.  Neuro function will be watched as he awakens.    Recommendations:   Continue antibiotics  Follow up on culture results  Watch vanco levels and renal function, especially if no MRSA is isolated from brain abscess    Antibiotics:   #2 Vancomycin 2.25 gm IV Q 12 hr   #2 Flagyl 500 mg IV Q 8 hr   #2 Cefepime 2 gm Iv Q 8 hr    IV lines:   R RAL 12/5    A risk-benefit assessment was entertained and the ongoing use of the catheter is warranted for IV access and the infusion of intravenous medications.    Family History:   No family history on file.    Social History:     History     Social History   . Marital Status: Married     Spouse Name: N/A     Number of Children: N/A   . Years of Education: N/A     Social History Main Topics   . Smoking status: Light Tobacco Smoker   . Smokeless tobacco: Never Used      Comment: Occasional Cigars   . Alcohol Use: 0.0 oz/week     4-5 Cans of beer per week   . Drug Use: No   . Sexually Active:      Other Topics Concern   . Not on file     Social History Narrative   . No narrative on file       Allergies:     Allergies   Allergen Reactions   . Morphine    . Penicillins        Review of Systems:   Intubated, still poorly responsive post op.  Sedation just stopped, making urine,  oxygenating well on 40% head wrapped, lines in place    Physical Exam:   BP 117/63  Pulse 72  Temp 98.5 F (36.9 C) (Oral)  Resp 12  Ht 1.778 m (5\' 10" )  Wt 124.7 kg (274 lb 14.6 oz)  BMI 39.45 kg/m2  SpO2 99%  Asleep after sedation just removed. Beginning to follow commands with all extremities, skin dry, line sites ok, lungs with coarse breath sounds, CV normal, abdomen obese soft, extrem normal  Select labs:   Brain abscess gram stain with WBC and few gram negative rods seen    Lab 10/01/13 0347   WBC 22.03*   HGB 14.3   HCT 43.0   PLT 374       Lab 10/01/13 0347   NA 144   K 3.8   CL 110*   CO2 20*   BUN 25.0*   CREAT 0.7   CA 8.5   ALB --   PROT --   BILITOTAL --  ALKPHOS --   ALT --   AST --   GLU 138*        Rads:   CXR- left basilar haziness    Attestations:     I certify that I personally reviewed the interim medical record, interviewed the patient, performed a physical examination, as documented above, reviewed the medications being actively administered, and reviewed the referenced laboratory and radiographic studies.  I have discussed my thoughts with the patient and with relevant medical team members.  I have given thought to the complex medical conditions present and have endeavored to balance the interventions required by the acute conditions with the potential toxicities of the medications and procedures on the patient's well being and on the status of the other chronic conditions. I have considered the potential drug interactions between anti-microbial agents we have recommended and other medications required by the patient.   I have documented my recommendations for the primary attending, the medical trainees and the other medical and/or surgical consultants involved in caring for the patient.  A total of 35  minutes were spent in the care of this patient.    Signed by: Guadalupe Maple, MD

## 2013-10-01 NOTE — Progress Notes (Signed)
Neurocritical Care Daily Progress Note    Patient Name: [name] Brown, [name] Brown  Room:F-7/.05 Date Time: 10/01/2013 12:04 AM   Admit Date: 09/30/2013    Hospital LOS# 1    24 hr Events: s/p craniotomy for drainage of abscess    Presentation/Hospital course:   47 yo M PMH DM, prior sinus infections and sinus surgery, HLD, OSA, on antibiotics and steroids for sinus infection admitted with MS change to [facility] on 12/5.  The patient c/o HA, fatigue, lethargy.  Head CT showed abscess R frontal lobe, vasogenic edema, mass effect, early mild hydrocephalus, and subfalcine herniation.  Facial bone CT showed bilateral acute ethmoidal sinusitis and chronic changes to the nose and ethmoid and sphenoid sinuses.    12/6: Craniotomy for drainage of yellow, purulent material      ASSESSMENT                                 PLAN  Frontal cerebral abscess  Vasogenic edema  Brain herniation  Chronic sinusitis  DM  HLD  OSA     NEURO: Frontal cerebral abscess s/p craniotomy for drainage.  Decadron  Broad spectrum abx  Will need to reexamine after vecuronium has worn off  Nsurg and ENT following    -Goals:   Perfusion goals: SBP < 160 mm Hg  Na+ goal: 140-145, will need to repeat labs  Sedation: propofol, fentanyl prn  EVD    PT/OT/Speech/swallow eval: ordered    CV: perfusion goals as above    PULM: respiratory alkalosis, will repeat ABG postop and adjust ventilation as required.  F/u daily CXR.  -Daily SBT    GI:   GI PPX: pepcid  Nutrition: npo    RENAL: maint IVF  I/O Goal: even    ID:  Appreciate ID recs.  On cefepime, flagyl, and vancomycin.  Cultures pending    HEME: repeat CBC pending, EBL minimal  SCD's  Pharmacologic ppx: not safe secondary to acute neurologic injury     ENDO/RHEUM: Hx DM  Glucose: goal 100 -180, on insulin gtt     Lines/Foley: arterial line, Foley  Daily labs/imaging: yes  Family Meeting: Wife at bedside and updated    VITALS, LINES, IMAGING, LABS, MICRO, MEDS, GOALS reviewed and d/w bedside nurse                    Code status Full Code  DISPO:ICU      Physical Exam:   Vitals reviewed in EPIC  Filed Vitals:    09/30/13 1900 09/30/13 2000 09/30/13 2350 09/30/13 2358   BP: 112/66 126/76 120/65    Pulse: 71 70 79    Temp:  99.5 F (37.5 C)     TempSrc:  Oral     Resp: 18 18 18     Height:    1.778 m (5\' 10" )   Weight:       SpO2: 100% 100% 98% 98%       General Appearance: intubated, sedated  Vent: PRVC       Lines:arterial line        EVD @  ICP     CSF  Drain:   Foley:yes       Surgical sites:     NEURO EXAM:  Mental Status:  Sedation: propofol held, vecuronium administered 1 hour prior                Opens eyes: none  Orientation: intubated          Language:  intubated          Cranial Nerves  Pupils  OS: size/reactivity: 2R              OD: size/reactivity: 2R                    EOM(III/IV/VI): unable to assess                               Visual field: unable to assess        Corneal(V/VII): intact        Face(VII):   Shoulder shrug/head turn(XI):          Cough/gag(IX/X): intact                     Uvula (IX/X):                  Tongue(XII):         Motor: does not f/c  UE   Deltoid  Bicep  Tricep  WE  WF  Grip    R  0         L 0           LE   Hip Flex  Hip Ext  Knee F  Knee E  DF  PF    R  0         L 0           Drift:     Sensory:   Cerebellar:   Reflexes/Babinki:    Other:   Bedside ultrasound:      Heart: regular nl S1 S2 no murmurs       Lungs:CTAB        Abd: soft NT ND nl bs        Ext: no edema, palp periph pulses        Medications:   Scheduled Meds: reviewed     Labs:   reviewed      Rads:   Radiological Imaging personally reviewed:    Condition(s) with high probability of imminent deterioration or death include:   Cerebral edema  Brain herniation    Life saving interventions include:  Intravenous blood pressure medication  Hyperosmolar therapy    I have personally reviewed the patient's history and 24 hour interval events, along with vitals, labs, radiology images and ventilator  settings. So far today I have spent ___47_____ minutes providing care for this patient excluding teaching and billable procedures, and not overlapping with any other providers.    Signed by: Frederik Pear, MD     Date/Time: 10/01/2013 12:04 AM      Neuro Critical Care.   Attending MD [phone] (24hr)  Midlevel Provider [phone] (7a-7p)

## 2013-10-02 ENCOUNTER — Inpatient Hospital Stay: Payer: BLUE CROSS/BLUE SHIELD

## 2013-10-02 DIAGNOSIS — I498 Other specified cardiac arrhythmias: Secondary | ICD-10-CM

## 2013-10-02 DIAGNOSIS — G936 Cerebral edema: Secondary | ICD-10-CM

## 2013-10-02 DIAGNOSIS — E785 Hyperlipidemia, unspecified: Secondary | ICD-10-CM

## 2013-10-02 DIAGNOSIS — G9340 Encephalopathy, unspecified: Secondary | ICD-10-CM

## 2013-10-02 DIAGNOSIS — R079 Chest pain, unspecified: Secondary | ICD-10-CM

## 2013-10-02 DIAGNOSIS — E119 Type 2 diabetes mellitus without complications: Secondary | ICD-10-CM

## 2013-10-02 LAB — BASIC METABOLIC PANEL
BUN: 28 mg/dL — ABNORMAL HIGH (ref 9.0–21.0)
CO2: 20 mEq/L — ABNORMAL LOW (ref 22–29)
Calcium: 8.6 mg/dL (ref 8.5–10.5)
Chloride: 107 mEq/L (ref 98–107)
Creatinine: 0.7 mg/dL (ref 0.7–1.3)
Glucose: 300 mg/dL — ABNORMAL HIGH (ref 70–100)
Potassium: 4.5 mEq/L (ref 3.5–5.1)
Sodium: 139 mEq/L (ref 136–145)

## 2013-10-02 LAB — CBC
Hematocrit: 42.6 % (ref 42.0–52.0)
Hgb: 13.9 g/dL (ref 13.0–17.0)
MCH: 29.4 pg (ref 28.0–32.0)
MCHC: 32.6 g/dL (ref 32.0–36.0)
MCV: 90.3 fL (ref 80.0–100.0)
MPV: 10.4 fL (ref 9.4–12.3)
Nucleated RBC: 0 (ref 0–1)
Platelets: 293 (ref 140–400)
RBC: 4.72 (ref 4.70–6.00)
RDW: 13 % (ref 12–15)
WBC: 22.05 — ABNORMAL HIGH (ref 3.50–10.80)

## 2013-10-02 LAB — VANCOMYCIN, TROUGH: Vancomycin Trough: 7.5 ug/mL — ABNORMAL LOW (ref 10.0–20.0)

## 2013-10-02 LAB — GLUCOSE WHOLE BLOOD - POCT
Whole Blood Glucose POCT: 289 mg/dL — ABNORMAL HIGH (ref 70–100)
Whole Blood Glucose POCT: 268 mg/dL — ABNORMAL HIGH (ref 70–100)
Whole Blood Glucose POCT: 286 mg/dL — ABNORMAL HIGH (ref 70–100)

## 2013-10-02 LAB — TROPONIN I: Troponin I: 0.01 ng/mL (ref 0.00–0.09)

## 2013-10-02 LAB — GFR: EGFR: >60

## 2013-10-02 MED ORDER — ALUM & MAG HYDROXIDE-SIMETH 200-200-20 MG/5ML PO SUSP
30.0000 mL | Freq: Once | ORAL | Status: AC
Start: 2013-10-02 — End: 2013-10-02
  Administered 2013-10-02: 30 mL via ORAL
  Filled 2013-10-02: qty 30

## 2013-10-02 MED ORDER — DEXAMETHASONE SODIUM PHOSPHATE 4 MG/ML IJ SOLN (WRAP)
4.0000 mg | Freq: Four times a day (QID) | INTRAMUSCULAR | Status: DC
Start: 2013-10-02 — End: 2013-10-03
  Administered 2013-10-02 – 2013-10-03 (×2): 4 mg via INTRAVENOUS
  Filled 2013-10-02 (×3): qty 1

## 2013-10-02 MED ORDER — OXYCODONE HCL 5 MG PO TABS
5.0000 mg | ORAL_TABLET | Freq: Four times a day (QID) | ORAL | Status: DC | PRN
Start: 2013-10-02 — End: 2013-10-04
  Administered 2013-10-02 – 2013-10-04 (×5): 5 mg via ORAL
  Filled 2013-10-02 (×5): qty 1

## 2013-10-02 MED ORDER — SODIUM CHLORIDE 0.9 % IV SOLN
25.0000 mg | Freq: Three times a day (TID) | INTRAVENOUS | Status: DC | PRN
Start: 2013-10-02 — End: 2013-10-07
  Administered 2013-10-03: 25 mg via INTRAVENOUS
  Filled 2013-10-02 (×4): qty 1

## 2013-10-02 MED ORDER — SODIUM CHLORIDE 0.9 % IV SOLN
INTRAVENOUS | Status: DC
Start: 2013-10-02 — End: 2013-10-03

## 2013-10-02 MED ORDER — LIDOCAINE VISCOUS 2 % MT SOLN
10.0000 mL | Freq: Once | OROMUCOSAL | Status: AC
Start: 2013-10-02 — End: 2013-10-02
  Administered 2013-10-02: 10 mL via OROMUCOSAL
  Filled 2013-10-02 (×2): qty 15

## 2013-10-02 MED ORDER — INSULIN NPH (HUMAN) (ISOPHANE) 100 UNIT/ML SC SUSP
15.0000 [IU] | Freq: Once | SUBCUTANEOUS | Status: DC
Start: 2013-10-02 — End: 2013-10-03
  Filled 2013-10-02: qty 0.15

## 2013-10-02 MED ORDER — INSULIN GLARGINE 100 UNIT/ML SC SOLN
20.0000 [IU] | Freq: Every evening | SUBCUTANEOUS | Status: DC
Start: 2013-10-02 — End: 2013-10-03
  Administered 2013-10-02: 20 [IU] via SUBCUTANEOUS
  Filled 2013-10-02: qty 200

## 2013-10-02 NOTE — Progress Notes (Signed)
Stiffness. MD notified, troponin+ EKG obtained,magic mouthwash administered

## 2013-10-02 NOTE — Progress Notes (Signed)
Neurosurgery Progress Note  POD 2  S:   Feels better but complains of headache    O:   Filed Vitals:    10/02/13 1000   BP: 125/64   Pulse: 62   Temp:    Resp: 20   SpO2: 95%         PHYSICAL EXAM:  Awake, alert  Dressing intact  Face is symmetric  Speech is fluent  Moves all extremities well        A/P:   Neuro improved  Continue medical management  OOB from my standpoint  May transfer out of ICU from my standpoint      Rella Larve, MD

## 2013-10-02 NOTE — Progress Notes (Signed)
Neurocritical Care Daily Progress Note    Patient Name: [name] Brown  Room:F-7/.05 Date Time: 10/02/2013 7:02 AM   Admit Date: 09/30/2013    Hospital LOS# 2    24 hr Events:  Wound cultures from 12/05 growing Staph Aureus. Exam stable, FU head CT today then transfer to floor    Presentation/Hospital course:   47 yo M PMH DM, prior sinus infections and sinus surgery, HLD, OSA, on antibiotics and steroids for sinus infection admitted with MS change to [facility] on 12/5.  The patient c/o HA, fatigue, lethargy.  Head CT showed abscess R frontal lobe, vasogenic edema, mass effect, early mild hydrocephalus, and subfalcine herniation.  Facial bone CT showed bilateral acute ethmoidal sinusitis and chronic changes to the nose and ethmoid and sphenoid sinuses.    12/6: Craniotomy for drainage of yellow, purulent material      ASSESSMENT                                 PLAN  Frontal cerebral abscess  Vasogenic edema  Brain herniation  Chronic sinusitis  DM  HLD  OSA  Bradycardia NEURO: Frontal cerebral abscess s/p craniotomy for drainage.  Decadron taper started  Broad spectrum abx  Nsurg and ENT following  FU head CT today    -Goals:   Perfusion goals: SBP < 160 mm Hg  Na+ goal: 140-145, will need to repeat labs  Sedation: PO pain meds and PRN dilaudid  EVD    PT/OT/Speech/swallow eval: advance activity    CV: bradycardic at times, Sinus Brady, stable BP  CP- troponin negative, EKG SB, gi cocktail x1    PULM: extubated, protecting airway  IS, uses bipap at night at home    GI: mechanical altered  GI PPX: pepcid  Nutrition: PO    RENAL: continue IVF due to hyperglycemia and risk of dehydration  I/O Goal: even, remove foley    ID:  Afebrile, stable leukocytosis  Appreciate ID recs.  On cefepime, flagyl, and vancomycin. Trough at 2130.  Cultures- staph aureus, sensitivities pending    HEME: start Lovenox today if head CT stable  SCD's  Pharmacologic ppx: not safe secondary to acute neurologic injury     ENDO/RHEUM:  hyperglycemia- uncontrolled   15units NPH now and start Lantus 20units, high SSI  Glucose: goal 100 -180,     Lines/Foley: remove Foley  Daily labs/imaging: yes  Family Meeting: Wife at bedside and updated    VITALS, LINES, IMAGING, LABS, MICRO, MEDS, GOALS reviewed and d/w bedside nurse                   Code status Full Code  DISPO:transfer to floor if CT stable    Physical Exam:   Vitals reviewed in EPIC  Filed Vitals:    10/02/13 0200 10/02/13 0300 10/02/13 0400 10/02/13 0500   BP:       Pulse: 48 47 61 58   Temp:       TempSrc:       Resp:   35 12   Height:       Weight:       SpO2: 96% 95% 94% 96%       General Appearance:   Vent:   Lines:  Foley:yes     - remove today  Surgical sites: c/d/i    NEURO EXAM:  Mental Status:  Sedation: none  Opens eyes: spont  Orientation: x3 FC  Language:  Fluent, knows place, time, president    Cranial Nerves  Pupils  OS: size/reactivity: 2R              OD: size/reactivity: 2R                    EOM(III/IV/VI): intact                          Visual field: full         Corneal(V/VII): intact        Face(VII): left facial droop  Shoulder shrug/head turn(XI):          Cough/gag(IX/X): intact                     Uvula (IX/X):      upgoing         Tongue(XII):     ML    Motor:    UE   Deltoid  Bicep  Tricep  WE  WF  Grip    R  5/5         L 5/5           LE   Hip Flex  Hip Ext  Knee F  Knee E  DF  PF    R  5/5         L 5/5           Drift:  none    Sensory:  Intact LT all ext  Cerebellar: no dysmetria  Reflexes/Babinki:    Other:   Bedside ultrasound:      Heart: regular nl S1 S2 no murmurs       Lungs:CTAB        Abd: soft NT ND nl bs        Ext: no edema, palp periph pulses        Medications:   Scheduled Meds: reviewed     Labs:   reviewed      Rads:   Radiological Imaging personally reviewed: head CT this am    Above plan reviewed with Dr Jackelyn Poling.    I have personally reviewed the patient's history and 24 hour interval events, along with vitals, labs, radiology  images and ventilator settings. So far today I have spent ___41_____ minutes providing care for this patient excluding teaching and billable procedures, and not overlapping with any other providers.    Signed by: Renita Papa, MPAS, PA-C     Date/Time: 10/02/2013 7:02 AM      Neuro Critical Care.   Attending MD [phone] (24hr)  Midlevel Provider [phone] (7a-7p)

## 2013-10-02 NOTE — Consults (Signed)
Peripherally Inserted Central Catheter (PICC) PROCEDURE NOTE    Roop,Edan  10/02/2013    INDICATIONS: Compromised venous status    CONSENT: The procedure, risks, benefits and alternatives were discussed with the patient.  All questions were answered, and informed written consent was obtained from the patient.  Informed consent and Procedure Boarding Pass were completed.     PROCEDURE DETAILS:      Ultrasound was used to confirm patency of the Right Basilic vein prior to obtaining venous access. The area to be punctured was  cleansed with chlorhexidine 2% for more than 30 seconds and the site draped using maximal sterile barrier precautions.    After 1% lidocaine injected followed by puncture of the Right Basilic with a 21-gauge single-wall needle under direct sonographic guidance. Guidewire was advanced and the needle was removed and a peel-away sheath was placed. The catheter was then trimmed and advanced through the peel-away sheath using electronic navigation.  The sheath was removed, brisk blood return confirmed, the catheter was flushed with normal saline and capped.      Maximal sterile barrier was used: cap, mask, sterile gloves, sterile gown and body drape.  All guide wires were removed with tip intact by visual inspection.  The catheter was stabilized on the skin using a securement device.  Antimicrobial disc and sterile transparent occlusive dressing applied using aseptic technique.     Patient did tolerate procedure well.        Catheter Type: Power picc 84f double lumen  Insertion Site (vein): Right Basilic  Total Length: 50 cm  Internal Length: 49 cm  External Length: 1 cm  UAC: 38 cm    PICC Reference #: [id number]  PICC Kit Lot #: [id number]  PICC Kit expiration date: 12/2014    FINDINGS/CONCLUSIONS:   No signs of bleeding or symptoms of nerve irritation noted.  Final tip location was confirmed utilizing ECG tip confirmation.  PICC is ready for use  PICC education / instructions provided to  patient.  Ping-Chin Dora Sims, RN

## 2013-10-02 NOTE — Progress Notes (Signed)
10/02/2013 8:19 AM  Brown,[name]      Updated problem List   Acute Problem List:   Frontal brain abscess with midline shift   --drainage of brain abscess 09/30/13   -------------------------------------------   Hx multiple sinus surgeries craniotomy in 2003; 2008 for sinusitis with purulence   Allergy PCN   --------------------------------------------   Chronic:   DM   HLD   Sleep apnea    Assessment:   Improving neurologically as he has awoken from drainage of brain abscess.  The abscess fluid is growing S aureus. Nasal MRSA swab is negative, but we must provide presumptive coverage for MRSA in abscess until we have sensitivity results back.  Tolerating current combination of meds.  Renal function stable.    Recommendations:   Continue current antibiotics  We will design an antibiotic regimen for him, for long term administration, after sensitivity results are back.  Check vanco trough level prior to evening dose today    Antibiotics:   #3 Vancomycin 2.25 gm IV Q 12 hr   #3 Flagyl 500 mg IV Q 8 hr   #3 Cefepime 2 gm Iv Q 8 hr    IV lines:   PIV.    Family History:   No family history on file.    Social History:     History     Social History   . Marital Status: Married     Spouse Name: N/A     Number of Children: N/A   . Years of Education: N/A     Social History Main Topics   . Smoking status: Light Tobacco Smoker   . Smokeless tobacco: Never Used      Comment: Occasional Cigars   . Alcohol Use: 0.0 oz/week     4-5 Cans of beer per week   . Drug Use: No   . Sexually Active:      Other Topics Concern   . Not on file     Social History Narrative   . No narrative on file       Allergies:     Allergies   Allergen Reactions   . Morphine    . Penicillins        Review of Systems:   Eyes open, cooperative, but little conversation, admits to pain in head and sinuses, Denies dental pain, dysphagia, dyspnea, diarrhea, dyspepsia, dysuria, dermatitis, dystonia, distress    Physical Exam:   BP 114/60  Pulse 51  Temp 97.9 F  (36.6 C) (Oral)  Resp 12  Ht 1.778 m (5\' 10" )  Wt 124.7 kg (274 lb 14.6 oz)  BMI 39.45 kg/m2  SpO2 95%  Awake alert, cooperative but paucity of speech or movement, able to move all extrem well., head with clean dressing, face symmetric, lungs clear, CV normal, abdomen obese, soft notender, extrem normal  Select labs:   Abscess fluid growing S aureus (S pending)  AFB and fungal smears negative  Nasal MRSA swab negative    Lab 10/02/13 0434   WBC 22.05*   HGB 13.9   HCT 42.6   PLT 293       Lab 10/02/13 0434   NA 139   K 4.5   CL 107   CO2 20*   BUN 28.0*   CREAT 0.7   CA 8.6   ALB --   PROT --   BILITOTAL --   ALKPHOS --   ALT --   AST --   GLU 300*  Rads:   none    Attestations:     I certify that I personally reviewed the interim medical record, interviewed the patient, performed a physical examination, as documented above, reviewed the medications being actively administered, and reviewed the referenced laboratory and radiographic studies.  I have discussed my thoughts with the patient and with relevant medical team members.  I have given thought to the complex medical conditions present and have endeavored to balance the interventions required by the acute conditions with the potential toxicities of the medications and procedures on the patient's well being and on the status of the other chronic conditions. I have considered the potential drug interactions between anti-microbial agents we have recommended and other medications required by the patient.   I have documented my recommendations for the primary attending, the medical trainees and the other medical and/or surgical consultants involved in caring for the patient.  A total of 35  minutes were spent in the care of this patient.    Signed by: Guadalupe Maple, MD

## 2013-10-02 NOTE — Progress Notes (Signed)
Pt cont to refuse activity despite freq and stern encouragement

## 2013-10-03 ENCOUNTER — Inpatient Hospital Stay: Payer: BLUE CROSS/BLUE SHIELD

## 2013-10-03 DIAGNOSIS — G935 Compression of brain: Secondary | ICD-10-CM

## 2013-10-03 LAB — CBC
Hematocrit: 38.8 % — ABNORMAL LOW (ref 42.0–52.0)
Hgb: 12.4 g/dL — ABNORMAL LOW (ref 13.0–17.0)
MCH: 28.8 pg (ref 28.0–32.0)
MCHC: 32 g/dL (ref 32.0–36.0)
MCV: 90.2 fL (ref 80.0–100.0)
MPV: 10.7 fL (ref 9.4–12.3)
Nucleated RBC: 0 (ref 0–1)
Platelets: 252 (ref 140–400)
RBC: 4.3 — ABNORMAL LOW (ref 4.70–6.00)
RDW: 13 % (ref 12–15)
WBC: 13.99 — ABNORMAL HIGH (ref 3.50–10.80)

## 2013-10-03 LAB — BASIC METABOLIC PANEL
BUN: 31 mg/dL — ABNORMAL HIGH (ref 9.0–21.0)
BUN: 30 mg/dL — ABNORMAL HIGH (ref 9.0–21.0)
CO2: 21 mEq/L — ABNORMAL LOW (ref 22–29)
CO2: 21 mEq/L — ABNORMAL LOW (ref 22–29)
Calcium: 7.5 mg/dL — ABNORMAL LOW (ref 8.5–10.5)
Calcium: 8.2 mg/dL — ABNORMAL LOW (ref 8.5–10.5)
Chloride: 109 mEq/L — ABNORMAL HIGH (ref 98–107)
Chloride: 109 mEq/L — ABNORMAL HIGH (ref 98–107)
Creatinine: 0.6 mg/dL — ABNORMAL LOW (ref 0.7–1.3)
Creatinine: 0.7 mg/dL (ref 0.7–1.3)
Glucose: 278 mg/dL — ABNORMAL HIGH (ref 70–100)
Glucose: 277 mg/dL — ABNORMAL HIGH (ref 70–100)
Potassium: 3.9 mEq/L (ref 3.5–5.1)
Potassium: 4.2 mEq/L (ref 3.5–5.1)
Sodium: 136 mEq/L (ref 136–145)
Sodium: 140 mEq/L (ref 136–145)

## 2013-10-03 LAB — I-STAT E3 CARTRIDGE
Hematocrit I-Stat: 37 % — ABNORMAL LOW (ref 42.0–52.0)
Hematocrit I-Stat: 39 % — ABNORMAL LOW (ref 42.0–52.0)
Hemoglobin I-Stat: 12.6 g/dL — ABNORMAL LOW (ref 13.0–17.0)
Hemoglobin I-Stat: 13.3 g/dL (ref 13.0–17.0)
Potassium I-Stat: 3.5 mEq/L (ref 3.5–5.3)
Potassium I-Stat: 3.9 mEq/L (ref 3.5–5.3)
Sodium I-Stat: 143 mEq/L (ref 136–146)
Sodium I-Stat: 157 mEq/L — ABNORMAL HIGH (ref 136–146)

## 2013-10-03 LAB — GLUCOSE WHOLE BLOOD - POCT
Whole Blood Glucose POCT: 265 mg/dL — ABNORMAL HIGH (ref 70–100)
Whole Blood Glucose POCT: 309 mg/dL — ABNORMAL HIGH (ref 70–100)
Whole Blood Glucose POCT: 243 mg/dL — ABNORMAL HIGH (ref 70–100)
Whole Blood Glucose POCT: 233 mg/dL — ABNORMAL HIGH (ref 70–100)

## 2013-10-03 LAB — ECG 12-LEAD
Atrial Rate: 44 {beats}/min
P Axis: 3 degrees
P-R Interval: 142 ms
Q-T Interval: 474 ms
QRS Duration: 106 ms
QTC Calculation (Bezet): 405 ms
R Axis: 5 degrees
T Axis: 18 degrees
Ventricular Rate: 44 {beats}/min

## 2013-10-03 LAB — C-REACTIVE PROTEIN: C-Reactive Protein: 2.9 mg/dL — ABNORMAL HIGH (ref 0.0–0.8)

## 2013-10-03 LAB — GFR
EGFR: >60
EGFR: >60

## 2013-10-03 LAB — SODIUM: Sodium: 150 mEq/L — ABNORMAL HIGH (ref 136–145)

## 2013-10-03 LAB — SEDIMENTATION RATE: Sed Rate: 32 — ABNORMAL HIGH (ref 0–10)

## 2013-10-03 MED ORDER — FAMOTIDINE 20 MG PO TABS
20.0000 mg | ORAL_TABLET | Freq: Two times a day (BID) | ORAL | Status: DC
Start: 2013-10-03 — End: 2013-10-07
  Administered 2013-10-03 – 2013-10-07 (×9): 20 mg via ORAL
  Filled 2013-10-03 (×9): qty 1

## 2013-10-03 MED ORDER — SODIUM CHLORIDE 3 % IV SOLN
INTRAVENOUS | Status: AC
Start: 2013-10-03 — End: 2013-10-03
  Filled 2013-10-03: qty 500

## 2013-10-03 MED ORDER — SODIUM CHLORIDE 3 % IV SOLN
INTRAVENOUS | Status: DC
Start: 2013-10-03 — End: 2013-10-04
  Administered 2013-10-03 – 2013-10-04 (×2): 30 mL/h via INTRAVENOUS
  Filled 2013-10-03: qty 500

## 2013-10-03 MED ORDER — SODIUM CHLORIDE 1 G PO TABS
2.0000 g | ORAL_TABLET | Freq: Three times a day (TID) | ORAL | Status: DC
Start: 2013-10-03 — End: 2013-10-06
  Administered 2013-10-03 – 2013-10-06 (×10): 2 g via ORAL
  Filled 2013-10-03 (×9): qty 2

## 2013-10-03 MED ORDER — ENOXAPARIN SODIUM 40 MG/0.4ML SC SOLN
40.0000 mg | Freq: Every day | SUBCUTANEOUS | Status: DC
Start: 2013-10-03 — End: 2013-10-07
  Administered 2013-10-03 – 2013-10-07 (×5): 40 mg via SUBCUTANEOUS
  Filled 2013-10-03 (×5): qty 0.4

## 2013-10-03 MED ORDER — SODIUM CHLORIDE 0.9 % IV BOLUS
1000.0000 mL | Freq: Once | INTRAVENOUS | Status: AC
Start: 2013-10-03 — End: 2013-10-03
  Administered 2013-10-03: 1000 mL via INTRAVENOUS

## 2013-10-03 MED ORDER — DEXAMETHASONE 4 MG PO TABS
4.0000 mg | ORAL_TABLET | Freq: Three times a day (TID) | ORAL | Status: DC
Start: 2013-10-03 — End: 2013-10-04
  Administered 2013-10-03 – 2013-10-04 (×3): 4 mg via ORAL
  Filled 2013-10-03 (×5): qty 1

## 2013-10-03 MED ORDER — INSULIN NPH (HUMAN) (ISOPHANE) 100 UNIT/ML SC SUSP
15.0000 [IU] | Freq: Once | SUBCUTANEOUS | Status: AC
Start: 2013-10-03 — End: 2013-10-03
  Administered 2013-10-03: 15 [IU] via SUBCUTANEOUS
  Filled 2013-10-03: qty 0.15

## 2013-10-03 MED ORDER — INSULIN GLARGINE 100 UNIT/ML SC SOLN
30.0000 [IU] | Freq: Every evening | SUBCUTANEOUS | Status: DC
Start: 2013-10-03 — End: 2013-10-04
  Administered 2013-10-03: 30 [IU] via SUBCUTANEOUS
  Filled 2013-10-03: qty 300

## 2013-10-03 MED ORDER — SODIUM CHLORIDE 0.9 % IV MBP
2.0000 g | Freq: Two times a day (BID) | INTRAVENOUS | Status: DC
Start: 2013-10-03 — End: 2013-10-07
  Administered 2013-10-03 – 2013-10-07 (×9): 2 g via INTRAVENOUS
  Filled 2013-10-03 (×2): qty 2000
  Filled 2013-10-03: qty 50
  Filled 2013-10-03 (×7): qty 2000

## 2013-10-03 MED ORDER — DEXAMETHASONE 4 MG PO TABS
4.0000 mg | ORAL_TABLET | Freq: Four times a day (QID) | ORAL | Status: DC
Start: 2013-10-03 — End: 2013-10-03
  Administered 2013-10-03: 4 mg via ORAL

## 2013-10-03 NOTE — Progress Notes (Signed)
[name] Brown  48 y.o.     male    NUTRITION:  Reason for assessment: Screen    Past Medical History   Diagnosis Date   . Seasonal allergic rhinitis    . Diabetes mellitus      Active Hospital Problems    Diagnosis   . Brain abscess       Social/Diet History:  GI Sxs: None noted at this time  Skin: Braden scale 17; head wound clean, dry . Intact    Pertinent Meds:  ceftriaxone; decadron; lovenox; insulin; zocor      Pertinent Labs:    Lab 10/03/13 0446   NA 136   K 3.9   CL 109*   CO2 21*   BUN 31.0*   CREAT 0.6*   CA 7.5*   ALB --   PROT --   BILITOTAL --   ALKPHOS --   ALT --   AST --   GLU 278*      CRP pending       Anthropometrics  Height: 177.8 cm (5\' 10" )  Weight: 124.7 kg (274 lb 14.6 oz)  Weight Change: 0   IBW/kg (Calculated) Male: 75.48 kg  IBW/kg (Calculated) Male: 68.16 kg  BMI (calculated): 39.5       Allergies   Allergen Reactions   . Morphine    . Penicillins          Current Diet Order  Diet dysphagia MECHANICALLY ALTERED Liquid consistency:: Thin    Learning Needs: Not at this time      Nutrition Diagnosis:     Clinical Swallowing Difficulty related to mild oropharyngeal dysphagia   as evidenced by SLP eval.      Intervention:  Continue to provide diet as ordered and tolerated. Adjust per SLP recs/ MD order. If po < 50-75% add nutrition supplement such as Glucerna.  Recommend checking HBA1C if not recently checked in out-pt setting.     Goals: Pt will tolerate >75 % of meals by next RD intervention.    M/E:    Remain available to monitor po; GI; SLP evals and intervene per clinical course.      Madison Hickman, MS RD CSO  [phone]

## 2013-10-03 NOTE — Progress Notes (Signed)
Pt has order for picc line placement. Pt had new picc placed yesterday 10/02/13. RN and attending MD aware. Picc order d/c'd.  Mickeal Skinner, RN

## 2013-10-03 NOTE — Progress Notes (Signed)
Neurocritical Care Daily Progress Note    Patient Name: JACKSTON, OAXACA  Room:F-7/.05 Date Time: 10/03/2013 7:33 AM   Admit Date: 09/30/2013    Hospital LOS# 3        Presentation/Hospital course:   47 yo M PMH DM, prior sinus infections and sinus surgery, HLD, OSA, on antibiotics and steroids for sinus infection admitted with MS change to [facility] on 12/5.  The patient c/o HA, fatigue, lethargy.  Head CT showed abscess R frontal lobe, vasogenic edema, mass effect, early mild hydrocephalus, and subfalcine herniation.  Facial bone CT showed bilateral acute ethmoidal sinusitis and chronic changes to the nose and ethmoid and sphenoid sinuses.    12/6: Craniotomy for drainage of yellow, purulent material  12/8: CSF with MSSA, Vancomycin d/c. CT with inc cerebral edema, 3% started      ASSESSMENT                                 PLAN  Frontal cerebral abscess  Vasogenic edema  Brain herniation  Chronic sinusitis  DM  HLD  OSA  Bradycardia           NEURO: Frontal cerebral abscess s/p craniotomy for drainage. Aside from affect, exam unremarkable. However, CT with sig mass effect from edema a/declining na.    -start 3% to get na to goal, salt tabs serial na  Decadron taper   CSF MSSA, antibiotics  Nsurg and ENT following  FU head CT - significant cerebral edema, small hemorrhage at surgical site    -Goals:   Perfusion goals: SBP < 160 mm Hg  Na+ goal: 140-145,   Sedation: PO pain meds and PRN dilaudid  EVD    PT/OT/Speech/swallow eval: advance activity    CV: bradycardic at times, Sinus Brady, stable BP    PULM:   IS, uses bipap at night at home    GI: mechanical altered  GI PPX: pepcid  Nutrition: PO    RENAL: IVF bolus for dehydration,   I/O Goal: even    ID:  Afebrile, stable leukocytosis  Appreciate ID recs.  Cultures- MSSA, de-escalated to Ceftriaxone 2g q12h    HEME: start Lovenox    SCD's  Pharmacologic ppx: not safe secondary to acute neurologic injury     ENDO/RHEUM: hyperglycemia-   Insulin am dose not given  yesterday by RN.   15units NPH now and increase Lantus 30units, high SSI  Glucose: goal 100 -180,     Lines/Foley: PICC 12/7  Daily labs/imaging: yes  Family Meeting: Wife at bedside and updated    VITALS, LINES, IMAGING, LABS, MICRO, MEDS, GOALS reviewed and d/w bedside nurse                   Code status Full Code  DISPO NSICU- keep patient in ICU due to significant cerebral  edema     Physical Exam:   Vitals reviewed in EPIC  Filed Vitals:    10/03/13 0200 10/03/13 0300 10/03/13 0400 10/03/13 0500   BP: 120/60 121/65 141/67 127/64   Pulse: 47 45 43 44   Temp:       TempSrc:       Resp:  9 10 9    Height:       Weight:       SpO2: 96% 96% 97% 95%       General Appearance: flat affect  Vent:   Lines: PICC 12/7  Foley:  Surgical sites: head wrap    NEURO EXAM:  Mental Status:  Sedation: none              Opens eyes: spont  Orientation: x3   Language:  Fluent,fc    Cranial Nerves  Pupils  OS: size/reactivity: 2R              OD: size/reactivity: 2R                    EOM(III/IV/VI): intact                          Visual field: full         Corneal(V/VII): intact        Face(VII): left facial droop  Shoulder shrug/head turn(XI):          Cough/gag(IX/X): intact                     Uvula (IX/X):      upgoing         Tongue(XII):     ML    Motor:    UE   Deltoid  Bicep  Tricep  WE  WF  Grip    R  5/5         L 5/5           LE   Hip Flex  Hip Ext  Knee F  Knee E  DF  PF    R  5/5         L 5/5           Drift:  none    Sensory:  Intact LT all ext  Cerebellar: no dysmetria  Reflexes/Babinki:    Other:   Bedside ultrasound:      Heart: regular nl S1 S2 no murmurs       Lungs:CTAB        Abd: soft NT ND nl bs        Ext: no edema, palp periph pulses        Medications:   Scheduled Meds: reviewed     Labs:   reviewed      Rads:   Radiological Imaging personally reviewed: head CT this am    Above plan reviewed with Dr Jackelyn Poling.    I have personally reviewed the patient's history and 24 hour interval events, along with  vitals, labs, radiology images and ventilator settings. So far today I have spent   47 minutes providing care for this patient excluding teaching and billable procedures, and not overlapping with any other providers.    Signed by: Renita Papa, MPAS, PA-C     Date/Time: 10/03/2013 7:33 AM      Neuro Critical Care.   Attending MD [phone] (24hr)  Midlevel Provider [phone] (7a-7p)

## 2013-10-03 NOTE — Plan of Care (Addendum)
Patient transfer put on hold due to repeat head CT. [facility] monitoring patient.  Patient is q2 neuro checks, vitals, and Is&Os.   Patient OOB to chair for most of the day. PT saw patient.     Will continue to monitor vital signs, neuro status, education, ability to transfer, intake and output, and safety.

## 2013-10-03 NOTE — Progress Notes (Signed)
NEUROSURGERY PROGRESS NOTE    Date Time: 10/03/2013 12:56 PM  Patient Name: [name] Brown  Consulting Attending Physician: Dr. Vivia Budge  Covered By: Neurosurgery [facility], [id number]    Interim History:   POD 3  Pt reports feeling better.  Reports minimal headache/incisional pain. No new neurological complaints.    Medications:     Current Facility-Administered Medications   Medication Dose Route Frequency   . [COMPLETED] alum & mag hydroxide-simethicone  30 mL Oral Once   . cefTRIAXone  2 g Intravenous Q12H SCH   . dexamethasone  4 mg Oral Q8H SCH   . enoxaparin  40 mg Subcutaneous Daily   . famotidine  20 mg Oral Q12H SCH   . insulin glargine  30 Units Subcutaneous QHS   . [COMPLETED] insulin NPH  15 Units Subcutaneous Once   . [COMPLETED] lidocaine viscous  10 mL Mouth/Throat Once   . senna-docusate  2 tablet Oral QHS   . simvastatin  40 mg per OG tube Daily with dinner   . [COMPLETED] sodium chloride  1,000 mL Intravenous Once   . sodium chloride  2 g Oral TID MEALS   . [DISCONTINUED] cefepime  2 g Intravenous Q8H SCH   . [DISCONTINUED] chlorhexidine  15 mL Mouth/Throat Q12H SCH   . [DISCONTINUED] dexamethasone  4 mg Intravenous Q6H SCH   . [DISCONTINUED] dexamethasone  4 mg Oral Q6H   . [DISCONTINUED] famotidine  20 mg Intravenous Q12H SCH   . [DISCONTINUED] insulin glargine  20 Units Subcutaneous QHS   . [DISCONTINUED] insulin NPH  15 Units Subcutaneous Once   . [DISCONTINUED] metroNIDAZOLE  500 mg Intravenous Q8H   . [DISCONTINUED] vancomycin  2,250 mg Intravenous Q12H         Physical Exam:     Filed Vitals:    10/03/13 1200   BP: 127/68   Pulse: 63   Temp: 98.2 F (36.8 C)   Resp: 17   SpO2: 96%       Intake and Output Summary (Last 24 hours) at Date Time    Intake/Output Summary (Last 24 hours) at 10/03/13 1256  Last data filed at 10/03/13 0800   Gross per 24 hour   Intake   1725 ml   Output    875 ml   Net    850 ml     Neuro exam:  Awake, alert, somewhat drowsy during exam, oriented x3  Speech fluent, follows  commands 100%  PERRL, OU 2mm, TML  Slight left facial droop.  MAE, BUE and BLE 5/5 strength  SILT  (-) pronator drift    Incision: head staples are c/d/i.     Labs:     Lab Results   Component Value Date    WBC 13.99* 10/03/2013    HGB 12.4* 10/03/2013    HCT 38.8* 10/03/2013    MCV 90.2 10/03/2013    PLT 252 10/03/2013     Lab Results   Component Value Date    NA 136 10/03/2013    K 3.9 10/03/2013    CL 109* 10/03/2013    CO2 21* 10/03/2013     Lab Results   Component Value Date    INR 1.0 09/30/2013    PT 12.9 09/30/2013       Rads:     Radiology Results (24 Hour)     Procedure Component Value Units Date/Time    CT Head WO Contrast [[id number]] Collected:10/03/13 0639    Order Status:Completed  Updated:10/03/13 5621  Narrative:    INDICATION:  Intracerebral abscess.  History of sinus surgery.    COMPARISON: No available prior study with which to compare.     TECHNIQUE:   Axial imaging of the brain was performed from base to  vertex. Scans were performed without IV contrast.     FINDINGS:  There is evidence of prior bicoronal craniotomy.  There are  postsurgical changes from recent right frontal craniotomy with scalp  soft tissue swelling, a scalp hematoma and subcutaneous emphysema. Deep  to the craniotomy site there is an extra axial collection containing  air, hemorrhage and fluid measuring 7 mm in greatest diameter. The  previously demonstrated air and fluid filled cavity within the medial  inferior right frontal lobe has decreased in size  now measuring approximately 4.4 x 3.0 cm with a small focus of air  located ventrally within the cavity measuring 1 cm.  There is extensive  surrounding hypoattenuation within the right frontal lobe extending into  the right basal ganglia as well as posteriorly abutting the corpus  callosum. There remains mass effect upon the frontal horn of the right  lateral ventricle as well as hypoattenuation within the medial left  frontal lobe.  There remains 5 mm of right-to-left midline shift  at the  level of the septum pellucidum ventrally.   There is diffuse cerebral  edema.           There is no detectable acute infarction.             The previously demonstrated air and fluid filled cavity within the  medial inferior right frontal lobe has decreased in size  now measuring approximately 4.4 x 3.0 cm with a small focus of air  located ventrally within the cavity measuring 1 cm.    There is evidence of prior extensive sinus surgery including septoplasty  and ethmoidectomy. Additionally, there is chronic mucoperiosteal  thickening of the maxillary sinuses.        Impression:      1.   Postsurgical changes from recent evacuation of right frontal  intracerebral abscess.   Evaluation is limited secondary to noncontrast  CT technique; however a residual collection containing air, fluid and  hemorrhage may be present within the right frontal lobe measuring  approximately 4.4 x 3.0 cm.       [name] Mc, MD   10/03/2013 6:52 AM          Assessment:   47 yo male presented with AMS x 1 day found to have a large right frontal lesion measuring 4.7 x 5.9 cm with large amount of air and fluid layering suggestive of abscess with history of acute-on-chronic sinusitis and multiple prior sinus surgeries, cerebral edema with anterior leftward midline shift of 2 cm with lateral ventricular effacement and uncal herniation s/p craniotomy for removal of SDH is now POD #3.    Head CT 12/7 showing post-surgical changes with expected cerebral edema.    Plan:   Neuro improved   May change dressing with crani cap.   Continue medical management   OOB as tolerated  May transfer out of ICU       Signed by: Rolinda Roan PA-C  Date/Time: 10/03/2013 12:56 PM

## 2013-10-03 NOTE — Progress Notes (Signed)
Updated problem List    Acute Problem List:   Frontal brain abscess with midline shift   --drainage of brain abscess 09/30/13 ; Culture +MSSA  -------------------------------------------   Hx multiple sinus surgeries craniotomy in 2003; 2008 for sinusitis with purulence   Allergy PCN   --------------------------------------------   Chronic:   DM   HLD   Sleep apnea   Assessment:    Improving neurologically as he has awoken from drainage of brain abscess. The abscess fluid is +MSSA  Recommendations:    Antibiotics streamlined to Ceftriaxone 2 gm IV Q12H  PCN allergic but tolerating Cefepime for days  Will require lengthy course; duration to be determined  Antibiotics:    #4 Vancomycin 2.25 gm IV Q 12 hr :  D/C  #4 Flagyl 500 mg IV Q 8 hr :  D/C  #4 Cefepime 2 gm Iv Q 8H  : D/C  D#4 (D#1 Ceftriaxone 2 gm IV Q12H)  Baseline ESR/CRP ordered  IV lines:    R PICC ; 10/02/13  Family History:    No family history on file.   Social History:      History      Social History    .  Marital Status:  Married      Spouse Name:  N/A      Number of Children:  N/A    .  Years of Education:  N/A      Social History Main Topics    .  Smoking status:  Light Tobacco Smoker    .  Smokeless tobacco:  Never Used       Comment: Occasional Cigars    .  Alcohol Use:  0.0 oz/week      4-5 Cans of beer per week    .  Drug Use:  No    .  Sexually Active:       Other Topics  Concern    .  Not on file      Social History Narrative    .  No narrative on file      Allergies:      Allergies    Allergen  Reactions    .  Morphine     .  Penicillins       Review of Systems:    Eyes open, cooperative, but little conversation, admits to pain in head and sinuses, Denies dental pain, dysphagia, dyspnea, diarrhea, dyspepsia, dysuria, dermatitis, dystonia, distress    Exam/Data:  Arouseable, moving all extremities  Afebrile, WBC decreased (22-13K), Cr=0.6  Brain abscess culture + MSSA  Sputum Culture + S. Aureus  MRSA Screen negative  Blood Cultures  negative  Lengthy D/W Wife at the bedside with many questions answered to her satisfaction    Will plan lengthy course of Ceftriaxone        35 minutes of Critical decision making required today

## 2013-10-03 NOTE — PT Eval Note (Signed)
[name]   Physical Therapy Evaluation   Patient: [name]    MRN#: [id number]   Unit: [facility]  Bed: F-7/.05    Discharge Recommendations:   Discharge Recommendation:  (home with assist as needed)   DME Recommendation: DME Recommended for Discharge:  (ongoing assessment)        Assessment:   [name] Brown is a 47 y.o. male admitted 09/30/2013.  Pt presents with flat affect and delay vs behavioral with following commands.  Exhibits good strength, but needs much encouragement for minimal mobility.  May benefit from PT to maximize functional independence.      Impairments: Assessment: Decreased endurance/activity tolerance;Decreased safety/judgement during functional mobility;Decreased functional mobility;Gait impairment.     Therapy Diagnosis: decreased independence with functional mobility and gait      Rehabilitation Potential: Prognosis: Good    Treatment Activities: functional mobility and gait training  Educated the patient to role of physical therapy, plan of care, goals of therapy and safety with mobility and ADLs.    Plan:   Treatment/Interventions: Exercise;Gait training;Functional transfer training;Patient/family training;Bed mobility     PT Frequency: 3-4x/wk   Risks/Benefits/POC Discussed with Pt/Family: With patient        Precautions and Contraindications:   Weight Bearing Status: no restrictions  Other Precautions: Fall    Consult received for Odette Horns for PT Evaluation and Treatment.  Patient's medical condition is appropriate for Physical therapy intervention at this time.    Medical Diagnosis: Abscess of brain [324.0]  Brain abscess [324.0] (Brain abscess)      History of Present Illness:   [name] Brown is a 47 y.o. male admitted on 09/30/2013 with AMS x 1 day prior to admission.  Found large R frontal abscess.  Underwent crani for removal of brain abscess 12/5.        Past Medical/Surgical History:  DM  Multiple sinus infections  HLD  Sleep  apnea    X-Rays/Tests/Labs:      CT head from outside hospital:  large   4 x 5 cm collection into the right frontal lobe with vasogenic edema, mass   effect about 2 cm of midline shift. There is prominence of the temporal   course suggesting early mild hydrocephalus. Also there is subfalcine   herniation and medial temporal lobe prominent suggesting early uncal   herniation. There is compression of the suprasellar cistern. There are no   signs of downward herniation. The facial bone CT shows bilateral acute   ethmoidal sinusitis and fibrosis and chronic changes of the nose and   ethmoid and sphenoid sinuses, status post multiple infections and prior   surgeries. There is destruction of the lamina papyracea      CT head post op:  IMPRESSION:   1. Postsurgical changes from recent evacuation of right frontal   intracerebral abscess. Evaluation is limited secondary to noncontrast   CT technique; however a residual collection containing air, fluid and   hemorrhage may be present within the right frontal lobe measuring   approximately 4.4 x 3.0 cm.     Social History:   Prior Level of Function:  Prior level of function: Ambulates / Performs ADL's independently  Baseline Activity Level: Community ambulation  DME Currently at Home:  (none)    Home Living Arrangements:  Living Arrangements: Spouse/significant other  Type of Home:  (currently in hotel with no stairs to maneuver)  DME Currently at Home:  (none)  Home Living - Notes / Comments: attempting to  purchase 3 level home    Subjective:   Patient is agreeable to participation in the therapy session. Family and/or guardian are agreeable to patient's participation in the therapy session. Nursing clears patient for therapy.     Patient Goal: to sleep    Pain Assessment  Pain Assessment: Numeric Scale (0-10)  Pain Score: 7-severe pain (headache, RN medicated)    Objective:   Observation of Patient/Vital Signs:  Patient is in bed with dressings, telemetry and peripheral IV,  PICC line in place.    Observation of Patient/Vital signs:  Inspection/Posture: scalp dressed    Cognition  Arousal/Alertness:  (Flat affect with delayed response to commands)  Attention Span: Attends to task with redirection  Following Commands: Follows one step commands with increased time;Follows one step commands with repetition    Neuro Status  Coordination: intact (grossly, but states L more difficult)  Cognitive Deficit(s):  (O x 3)  Hand Dominance: right handed      Sensation: denies numbness/tingling    Musculoskeletal Examination:  Gross ROM  Right Upper Extremity ROM: within functional limits  Left Upper Extremity ROM: within functional limits  Right Lower Extremity ROM: within functional limits  Left Lower Extremity ROM: within functional limits    Gross Strength  Right Upper Extremity Strength: within functional limits  Left Upper Extremity Strength: within functional limits  Right Lower Extremity Strength: within functional limits  Left Lower Extremity Strength: within functional limits    Tone  Tone: within functional limits    Functional Mobility:  Supine to Sit:  (CGA)  Scooting:  (CGA)  Sit to Stand: Min assist  Stand to Sit:  (close CGA)    Transfers  Bed to Chair: Min assist to right    Ambulation:  Ambulation: minimal assistance (without device)  Ambulation Distance (Feet): 3 Feet  Pattern:  (decreased step length)     Balance:  Balance:  (grossly, single leg stance Min A)    Participation and Activity Tolerance:  Participation Effort: fair (need significant encouragement)  Endurance:  (HR in high 40s (has been pt's norm per RN))      Patient left with call bell within reach, all needs met and all questions answered, RN notified of session outcome and patient response.      Goals:   Goals  Goal Formulation: With patient/family  Time for Goal Acheivement: 3 visits  Goals: Select goal  Pt Will Go Supine To Sit: Independently  Pt Will Transfer Bed/Chair: Independently  Pt Will Ambulate: > 200  feet;Independently  Pt Will Go Up / Down Stairs: 3-5 stairs;With stand by assist       Felipa Furnace, PT, DPT  Pager [phone]      Time of treatment:   PT Received On: 10/03/13  Start Time: 0830  Stop Time: 0915  Time Calculation (min): 45 min

## 2013-10-04 LAB — BASIC METABOLIC PANEL
BUN: 29 mg/dL — ABNORMAL HIGH (ref 9.0–21.0)
CO2: 21 mEq/L — ABNORMAL LOW (ref 22–29)
Calcium: 8.3 mg/dL — ABNORMAL LOW (ref 8.5–10.5)
Chloride: 109 mEq/L — ABNORMAL HIGH (ref 98–107)
Creatinine: 0.7 mg/dL (ref 0.7–1.3)
Glucose: 230 mg/dL — ABNORMAL HIGH (ref 70–100)
Potassium: 4.1 mEq/L (ref 3.5–5.1)
Sodium: 141 mEq/L (ref 136–145)

## 2013-10-04 LAB — CBC
Hematocrit: 41.8 % — ABNORMAL LOW (ref 42.0–52.0)
Hgb: 13.6 g/dL (ref 13.0–17.0)
MCH: 29.1 pg (ref 28.0–32.0)
MCHC: 32.5 g/dL (ref 32.0–36.0)
MCV: 89.3 fL (ref 80.0–100.0)
MPV: 10.7 fL (ref 9.4–12.3)
Nucleated RBC: 0 (ref 0–1)
Platelets: 300 (ref 140–400)
RBC: 4.68 — ABNORMAL LOW (ref 4.70–6.00)
RDW: 13 % (ref 12–15)
WBC: 17.42 — ABNORMAL HIGH (ref 3.50–10.80)

## 2013-10-04 LAB — I-STAT E3 CARTRIDGE
Hematocrit I-Stat: 40 % — ABNORMAL LOW (ref 42.0–52.0)
Hematocrit I-Stat: 33 % — ABNORMAL LOW (ref 42.0–52.0)
Hemoglobin I-Stat: 13.6 g/dL (ref 13.0–17.0)
Hemoglobin I-Stat: 11.2 g/dL — ABNORMAL LOW (ref 13.0–17.0)
Potassium I-Stat: 3.9 mEq/L (ref 3.5–5.3)
Potassium I-Stat: 3.6 mEq/L (ref 3.5–5.3)
Sodium I-Stat: 143 mEq/L (ref 136–146)
Sodium I-Stat: 143 mEq/L (ref 136–146)

## 2013-10-04 LAB — GFR: EGFR: >60

## 2013-10-04 LAB — GLUCOSE WHOLE BLOOD - POCT
Whole Blood Glucose POCT: 186 mg/dL — ABNORMAL HIGH (ref 70–100)
Whole Blood Glucose POCT: 220 mg/dL — ABNORMAL HIGH (ref 70–100)
Whole Blood Glucose POCT: 250 mg/dL — ABNORMAL HIGH (ref 70–100)
Whole Blood Glucose POCT: 244 mg/dL — ABNORMAL HIGH (ref 70–100)

## 2013-10-04 LAB — LAB USE ONLY - HISTORICAL SURGICAL PATHOLOGY

## 2013-10-04 MED ORDER — FENTANYL CITRATE 0.05 MG/ML IJ SOLN
INTRAMUSCULAR | Status: AC
Start: 2013-10-04 — End: 2013-10-05
  Filled 2013-10-04: qty 2

## 2013-10-04 MED ORDER — DEXAMETHASONE 4 MG PO TABS
4.0000 mg | ORAL_TABLET | Freq: Two times a day (BID) | ORAL | Status: DC
Start: 2013-10-04 — End: 2013-10-06
  Administered 2013-10-04 – 2013-10-06 (×4): 4 mg via ORAL
  Filled 2013-10-04 (×4): qty 1

## 2013-10-04 MED ORDER — OXYCODONE HCL 5 MG PO TABS
10.0000 mg | ORAL_TABLET | Freq: Four times a day (QID) | ORAL | Status: DC | PRN
Start: 2013-10-04 — End: 2013-10-05
  Administered 2013-10-04 – 2013-10-05 (×4): 10 mg via ORAL
  Filled 2013-10-04 (×4): qty 2

## 2013-10-04 MED ORDER — FENTANYL CITRATE 0.05 MG/ML IJ SOLN
25.0000 ug | Freq: Once | INTRAMUSCULAR | Status: AC
Start: 2013-10-04 — End: 2013-10-04
  Administered 2013-10-04: 25 ug via INTRAVENOUS

## 2013-10-04 MED ORDER — ACETAMINOPHEN 500 MG PO TABS
500.0000 mg | ORAL_TABLET | ORAL | Status: DC | PRN
Start: 2013-10-04 — End: 2013-10-07

## 2013-10-04 MED ORDER — INSULIN GLARGINE 100 UNIT/ML SC SOLN
40.0000 [IU] | Freq: Every morning | SUBCUTANEOUS | Status: DC
Start: 2013-10-05 — End: 2013-10-06
  Administered 2013-10-05 – 2013-10-06 (×2): 40 [IU] via SUBCUTANEOUS
  Filled 2013-10-04 (×2): qty 400

## 2013-10-04 MED ORDER — BUTALBITAL-APAP-CAFFEINE 50-325-40 MG PO TABS
2.0000 | ORAL_TABLET | Freq: Four times a day (QID) | ORAL | Status: DC | PRN
Start: 2013-10-04 — End: 2013-10-07
  Administered 2013-10-04 – 2013-10-07 (×9): 2 via ORAL
  Filled 2013-10-04 (×9): qty 2

## 2013-10-04 NOTE — Plan of Care (Signed)
Problem: Pain  Goal: Patient's pain/discomfort is manageable  Outcome: Progressing   Educated family and patient.

## 2013-10-04 NOTE — Plan of Care (Signed)
Problem: Neurological Deficit  Goal: Neurological status is stable or improving  Outcome: Progressing   Please see assesment . conitnue to monitor.  Educated the family and patient about fluid restriction and pain management.

## 2013-10-04 NOTE — SLP Progress Note (Signed)
[facility]   SLP Treatment Note  Patient: [name] Brown    MRN#: [id number]     Treatment Type: dysphagia follow up    Recommendations/Plan:   - Diet Recommendations: mechanical soft/thins, crush meds  - Aspiration Precautions: Awake/alert;Upright 90 degrees for all oral intake;Eat/feed slowly      SLP Frequency Recommended: 2x per week  SLP - Next Visit Recommended: 10/07/13    Assessment:   Pt presents with slight improvement in swallow function since last seen. Pt presents with mild oral and suspected pharyngeal dysphagia. Recommend upgrading pt's diet to mechanical soft/thins with aspiration precautions. SLP will continue to follow for diet tolerance and candidacy for diet upgrade.     Subjective:   Pain: denies  Current Diet:mechanically altered/thins   Respiratory Status: room air  Precautions: aspiration  Resume orders received. RN cleared pt for tx. Daughter and wife present throughout session. RN present at end of session. Pt agreeable to PO trials. Decreased vocal intensity throughout session.   Objective:   Objective:   Pt accepted PO trials of ice, thins, puree and regular solids. Pt with prolonged oral phase. Prolonged mastication with bilateral oral residuals with regular solids. Residuals partially cleared with liquid wash. Increased AP transfer time. Weak throat clear intermittently following regular solids. No other instance of overt s/s of aspiration observed.       Patient Education: verbally educated pt/family members/RN. All verbalized understanding      Goals:   Pt will tolerate a dysphagia mech altered diet with thin liquids with no s/s of aspiration x3-5 days. Goal met  Pt will masticate and clear regular trials with no s/s of aspiration 5/5xs. ongoing  Pt will tolerate a mechanical soft/thin liquid diet x72 hours without overt s/s of aspiration. New goal   [name], [place] CCC-SLP, [id number]    Time of Treatment:  SLP Received On: 10/04/13  Start Time: 1150  Stop Time: 1218  Time  Calculation (min): 28 min

## 2013-10-04 NOTE — Progress Notes (Signed)
PROGRESS NOTE    Date Time: 10/04/2013 11:32 AM  Patient Name: [facility]      Problem List:    Acute Problem List:   Frontal brain abscess with midline shift   --drainage of brain abscess 09/30/13 ; Culture +MSSA  -------------------------------------------   Hx multiple sinus surgeries craniotomy in 2003; 2008 for sinusitis with purulence   Allergy PCN   --------------------------------------------   Chronic:   DM   HLD   Sleep apnea       Assessment:   Improving clinically.  On appropriate coverage.  WIll likely need a 4-6 week course of ABx.  The appearance and location of the abscess as well as Hx of sinus surgeries are suggestive of direct extension from a sinus source.  Would have ENT weigh in on need for additional imaging or procedures in the future to prevent further episodes.      Antibiotics:   D#5 (D#2 Ceftriaxone 2 gm IV Q12H)    Plan:   Continue Ceftriaxone, pt appears to be tolerating this well.  ENT input regarding the possibility of direct extension  Discussed with pt and wife in detail    Lines:   R PICC ; 10/02/13    *I have performed a risk-benefit analysis and the patient needs a central line for access and IV medications    Family History:   Non-contributory to current care    Social History:     History     Social History   . Marital Status: Married     Social History Main Topics   . Smoking status: Light Tobacco Smoker   . Smokeless tobacco: Never Used      Comment: Occasional Cigars   . Alcohol Use: 0.0 oz/week     4-5 Cans of beer per week   . Drug Use: No   . Sexually Active:        Allergies:     Allergies   Allergen Reactions   . Morphine    . Penicillins        Review of Systems:   General ROS: negative for - chills, fevers, night sweats, weight loss   HEENT: negative for - blurry vision, sore throat, thrush   Respiratory ROS: negative for cough, SOB  Cardiovascular ROS: negative for - chest pain, palpitations   Gastrointestinal ROS: negative for - abdominal pain, nausea, vomiting,  diarrhea  Genito-Urinary ROS: negative for - dysuria, urinary frequency/urgency   Musculoskeletal ROS: negative for - joint pain, joint stiffness or muscle pain   Dermatological ROS: negative for - rash and skin lesion changes   Neurological ROS: negative for - confusion, +headache, dizziness  Hematological ROS: negative for - bruising, bleeding   Psychological ROS: negative for - changes in mood    Physical Exam:     Filed Vitals:    10/04/13 1000   BP: 119/72   Pulse: 57   Temp: 98.0   Resp: 14   SpO2: 96%       General Appearance: alert and appropriate, non-toxic  Neuro: alert, oriented, normal speech, normal attention and cognition  HEENT: no scleral icterus, pupils round and reactive, OP clear, NCAT, EOMI.  Dressing over scalp  Lungs: clear to auscultation, no wheezes, rales or rhonchi, symmetric air entry   Cardiac: normal rate, regular rhythm, normal S1, S2, No m/r/g  Abdomen: soft, non-tender, non-distended, normal active bowel sounds, No masses or organomegaly  Extremities: no pedal edema, No c/c  Skin: no rash  Psych: normal  mood and affect    Labs/Microbiology:     Recent CBC   Recent Labs   Basename 10/04/13 0400    RBC 4.68*    HGB 13.6    HCT 41.8*    MCV 89.3    MCH 29.1    MCHC 32.5    RDW 13    MPV 10.7    LABPLAT --     Recent CMP   Recent Labs   Basename 10/04/13 0400    GLU 230*    BUN 29.0*    CREAT 0.7    NA 141    CK --    CO2 21*    CA 8.3*    ALB --    AST --    ALT --    GLOB --     WBC:  17.4    ORDER#: [id number] ORDERED BY: LIM, JAE  SOURCE: Abscess brain COLLECTED: 09/30/13 22:30  ANTIBIOTICS AT COLL.: RECEIVED : 10/01/13 00:46  Stain, Gram FINAL 10/01/13 01:51  10/01/13 Many WBCs  No Squamous epithelial cells seen  Few Gram negative rods  Culture Wound FINAL 10/03/13 16:39 +  10/03/13 Gram negative rods seen on original Gram stain are non-viable for  aerobic culture.  10/02/13 Moderate growth of Staphylococcus  aureus      _____________________________________________________________________________  S. aureus   ANTIBIOTICS MIC INTRP   _____________________________________________________________________________  Clindamycin <=0.5 S   Erythromycin <=0.5 S   Gentamicin <=2 S   Levofloxacin <=1 S   Oxacillin 0.5 S   Penicillin >1 R   Rifampin <=0.5 S   Tetracycline <=0.5 S   Trimethoprim/Sulfamethoxazole <=0.5/9 S   Vancomycin 1 S     Rads:   Radiological Procedure reviewed.    Signed by: Micheline Rough, MD

## 2013-10-04 NOTE — PT Progress Note (Signed)
[facility]   Physical Therapy Treatment  Patient:  [name] Brown MRN#:  [id number]  Unit: [facility]  Bed: F-7/.05    Discharge Recommendations:   D/C Recommendations:  (likely home with assist as needed)   DME Recommendations: ongoing assessment        Assessment:   Pt more interactive with therapist today, but continues with mild delay in following commands (vs behavioral). Cleared to ambulate in hallway with multiple rest breaks, Min A for staggered gait.  Light sensitive.     Treatment Activities: functional mobility, gait and balance training    Educated the patient to role of physical therapy, plan of care, goals of therapy and safety with mobility and ADLs.    Plan:   PT Frequency: 3-4x/wk    Continue plan of care.       Precautions and Contraindications:   Fall      Updated Medical Status/Imaging/Labs: chart reviewed    Subjective:   Patient's medical condition is appropriate for Physical Therapy intervention at this time.  Patient is agreeable to participation in the therapy session. Nursing clears patient for therapy.    Pain:   Scale: 6-7/10  Location: headache  Intervention: RN medicated after ambulation      Objective:   Patient received in bed with IV access, telemetry in place.    Cognition  A and O x 3. Follows commands with delay (processing vs behavioral).  Mildly impulsive with mobility, especially when fatigued.    Functional Mobility  Rolling: with bedrail  Supine to Sit: CGA to Min A  Scooting: CGA  Sit to Stand: Min A  Stand to Sit: Min A  Transfers: Min A    Ambulation  Level of Assistance required: Min A  Ambulation Distance: 2' with 8 standing rest breaks  Pattern: narrowed base of support, flexed trunk, staggered gait  Device Used: with HHA  Weightbearing Status: no restrictions      Balance  Static Sitting: good  Dynamic Sitting: good  Static Standing: fair  Dynamic Standing: fair-      Patient Participation: good  Patient Endurance: fair, multiple rest  breaks    Patient left with call bell within reach, all needs met and all questions answered, RN notified of session outcome and patient response.     Goals:  Goals  Goal Formulation: With patient/family  Time for Goal Acheivement: 3 visits  Goals: Select goal  Pt Will Go Supine To Sit: Independently  Pt Will Transfer Bed/Chair: Independently  Pt Will Ambulate: > 200 feet;Independently  Pt Will Go Up / Down Stairs: 3-5 stairs;With stand by assist      Felipa Furnace, PT, DPT  Pager [phone]      Time of Treatment:  PT Received On: 10/04/13  Start Time: 1230  Stop Time: 1255  Time Calculation (min): 25 min  Treatment # 1 out of 3 visits

## 2013-10-05 LAB — BASIC METABOLIC PANEL
BUN: 21 mg/dL (ref 9.0–21.0)
CO2: 25 mEq/L (ref 22–29)
Calcium: 8 mg/dL — ABNORMAL LOW (ref 8.5–10.5)
Chloride: 107 mEq/L (ref 98–107)
Creatinine: 0.7 mg/dL (ref 0.7–1.3)
Glucose: 230 mg/dL — ABNORMAL HIGH (ref 70–100)
Potassium: 4.2 mEq/L (ref 3.5–5.1)
Sodium: 139 mEq/L (ref 136–145)

## 2013-10-05 LAB — CBC
Hematocrit: 38.6 % — ABNORMAL LOW (ref 42.0–52.0)
Hgb: 12.7 g/dL — ABNORMAL LOW (ref 13.0–17.0)
MCH: 29.3 pg (ref 28.0–32.0)
MCHC: 32.9 g/dL (ref 32.0–36.0)
MCV: 88.9 fL (ref 80.0–100.0)
MPV: 10.4 fL (ref 9.4–12.3)
Nucleated RBC: 0 (ref 0–1)
Platelets: 241 (ref 140–400)
RBC: 4.34 — ABNORMAL LOW (ref 4.70–6.00)
RDW: 13 % (ref 12–15)
WBC: 11.97 — ABNORMAL HIGH (ref 3.50–10.80)

## 2013-10-05 LAB — GLUCOSE WHOLE BLOOD - POCT
Whole Blood Glucose POCT: 187 mg/dL — ABNORMAL HIGH (ref 70–100)
Whole Blood Glucose POCT: 210 mg/dL — ABNORMAL HIGH (ref 70–100)
Whole Blood Glucose POCT: 253 mg/dL — ABNORMAL HIGH (ref 70–100)

## 2013-10-05 LAB — SODIUM
Sodium: 138 mEq/L (ref 136–145)
Sodium: 139 mEq/L (ref 136–145)
Sodium: 136 mEq/L (ref 136–145)

## 2013-10-05 LAB — GFR: EGFR: >60

## 2013-10-05 MED ORDER — OXYCODONE HCL 5 MG PO TABS
10.0000 mg | ORAL_TABLET | ORAL | Status: DC | PRN
Start: 2013-10-05 — End: 2013-10-07
  Administered 2013-10-05 – 2013-10-07 (×6): 10 mg via ORAL
  Filled 2013-10-05 (×6): qty 2

## 2013-10-05 NOTE — Progress Notes (Signed)
Case Management Initial Discharge Planning Assessment    Psychosocial/Demographic Information   Name of interviewee: Wife: Kenneth Cuaresma - [phone]   Healthcare Decision Maker (HDM) (if other than the patient) Patient    HDM - Relationship to Patient Self    HDM - Contact Information [phone]   Pt lives with Wife    Type of residence where patient lives Renting a hotel at the moment    DME / Assistive devices at home Very old CPAP machine   Prior level of functioning (ambulation & ADLs) Fully independent and working    Correct Insurance listed on face sheet - verified with the patient/HDM FEP BCBS      Any additional emergency contacts? Wife: Mylo Choi - [phone]   Does the patient have an Advance Directive? Yes   Is the POA/Guardianship documentation in shadow chart? (if applicable)  No    Source of Income (SSDI. SSI. Social Security, pension, employment, Catering manager) Employed      E[employer]ist in [employer]  Name of Primary Care Physician verified in patient banner (update in patient banner if not listed).  If no PCP, call [phone] with patient in room to get them connected with a PCP. None currently as the patient just relocated from [place].     What DME does the patient currently own? (rolling walker, hospital bed, home O2, BiPAP/CPAP, bedside commode, cane, hoyer lift) None    Has the patient been to an Acute Rehab or SNF in the past?  If so, where? No    Does the patient currently have home health or hospice/palliative services in place?  If so, list agency name. No    Does the patient already have community dialysis set up?  If so, where? No      Readmission Assessment  Current LACE Score Reviewed? Yes/No Yes   Is this patient an inpatient to inpatient 30 day readmission? No    Does the patient have difficulty obtaining his/her medications? No    Does the patient have difficulty getting to his/her physician appointments? No      Anticipated Discharge Plan  Discussed Anticipated  Discharge Date and Discharge Disposition Possibilities with: _x_Patient   ___Healthcare Decision Maker  ___Other   Anticipated Disposition: Option A TBD: Likely home with home health    Anticipated Disposition: Option B    Who will transport the patient when ready for discharge? (offer wheelchair Zenaida Niece service if patient/family cannot identify transport plan) Family    If applicable, were SNF or Hospice choices provided? N/A   Palliative Care Consult needed? (if yes, contact attending MD)  N/A   Geriatrics Consult needed? (if yes, contact attending MD) N/A   Elderlink Referral needed? (if yes, refer through [facility]) N/A   TCM Referral needed? (if yes, refer through [facility]) N/A   PACE Referral needed? (if yes, refer through [facility]) N/A   Are there any potential barriers to discharge identified?      ___Lack of Insurance  ___Lack of Health Literacy  ___Undocumented  ___No resources for meds or medical care  ___Transportation issues  ___Language/Cultural/Spiritual  ___Cognitive level / capacity  ___Psychiatric or substance abuse issues  ___Co-morbidities  ___Potential abuse or neglect  ___Safety issues in the home  ___Potential placement issues  ___Pt / family disagreement with d/c plan  ___Lack of family support  ___Lack of extended family / friend support  ___Home Estate agent (multi-level home/access          issues)   _X_ NONE  Inpatient Medicare/Medicare HMO Patients Only  Was an initial IMM signed within 24 hours of admission?  (Look in Media Tab, Documents Table or Shadow Chart) None      Uninsured Patients Only  If patient has a spouse, does your spouse have insurance under his/her place of employment? N/A   Did the patient sign up for insurance through the Affordable Care Act? N/A     Baltazar Apo, MSW  Supervisee in Social Work   PRN Social Worker/Hospital Discharge Planner - Mining engineer [facility]  Ph. [phone]

## 2013-10-05 NOTE — Plan of Care (Signed)
SB, vss, afebrile throughout shift. HR 40s-60s, asymptomatic. OOB to chair and ambulated with PT today. FC, MAE. Pupils 3 round brisk. Vitals and neuro checks q4h- neuro floor boarder. RA, lung sounds diminished posteriorly. fluid restriction- see I&O. Accucheck ACHS, daily lantus, requiring SSI. RUE PICC SL. C/o headache relieved with fiorcet and oxycodone. Voids and toilet. Denies BM today. Transfer to general floor when available.

## 2013-10-05 NOTE — PT Progress Note (Signed)
[facility]   Physical Therapy Treatment  Patient:  [name] [name] MRN#:  [id number]  Unit: [facility]  Bed: F-7/.05    Discharge Recommendations:   D/C Recommendations:  (home with assist as needed) vs acute rehab given balance and cognitive issues  DME Recommendations: ongoing assessment      Assessment:   Pt with staggered gait and intermittent loss of balance with mobility. Flat affect with limited participation (question behavioral vs deficit).  Pt needs much encouragement to participate with therapist.  In discussion with wife, pt was working and independent with mobility and pleasant.  Currently very contrary and easily agitated.  Will continue mobility training.      Treatment Activities: gait training, balance training, functional mobility    Educated the patient to role of physical therapy, plan of care, goals of therapy and safety with mobility and ADLs.    Plan:   PT Frequency: 3-4x/wk    Continue plan of care.       Precautions and Contraindications:   Fall    Updated Medical Status/Imaging/Labs: chart reviewed    Subjective:   Patient's medical condition is appropriate for Physical Therapy intervention at this time.  Patient is agreeable to participation in the therapy session. Nursing clears patient for therapy.    Pain:   Scale: 7/10, increased with mobility  Location: headache  Intervention: RN to medicate      Objective:   Patient received reclined in bedside chair with IV access and telemetry in place.    Cognition  A and O x 3.  Flat affect and easily agitated.    Functional Mobility  Sit to Stand: Min A  Stand to Sit: Min A  Transfers: Min A    Ambulation  Level of Assistance required: Min A  Ambulation Distance: 54' x 2 with multiple standing rest breaks  Pattern: forward pitch, staggered gait  Device Used: no device  Weightbearing Status: no restrictions      Balance  Static Sitting: good  Dynamic Sitting: good  Static Standing: fair  Dynamic Standing: fair-    Therapeutic  Exercises  Worked on balance standing both static and dynamic    Patient Participation: fair  Patient Endurance: good    Patient left with call bell within reach, all needs met and all questions answered, RN notified of session outcome and patient response.     Goals:  Goals  Goal Formulation: With patient/family  Time for Goal Acheivement: 3 visits  Goals: Select goal  Pt Will Go Supine To Sit: Independently  Pt Will Transfer Bed/Chair: Independently  Pt Will Ambulate: > 200 feet;Independently  Pt Will Go Up / Down Stairs: 3-5 stairs;With stand by assist      Mitchel Honour) [place], PT, DPT  Pager [phone]      Time of Treatment:  PT Received On: 10/05/13  Start Time: 0925  Stop Time: 0950  Time Calculation (min): 25 min  Treatment # 2 out of 3 visits

## 2013-10-05 NOTE — Progress Notes (Signed)
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 CX-MSSA  # Vasogenic edema  # Brain herniation  # Chronic sinusitis  # DM  # HLD  # OSA      Plan:     -cont ceftriaxone 2g IV Q12, vanc d/c  -cont decadron taper at 4mg  Q12  -maintain SBP<160  -Na goal 140-145  -1500cc fluid restriction  -cont NaCl tabs  -nocturnal BiPAP ordered  -cont lantus 40 units (dose increased today) may need adjustment however decadron being tapered  -ISS with accuchecks QAC  -pt/ot  -transfer to neurotele      Safety Checklist:     DVT prophylaxis:  CHEST guideline (See page [id number]) Chemical   Foley:  [place] Rn Foley protocol Not present   IVs:  Central IV: Present; Indication:  Long term medication(s)   PT/OT: Ordered   Daily CBC & or Chem ordered:  SHM/ABIM guidelines (see #5) Yes, due to clinical and lab instability   Reference for approximate charges of common labs: CBC auto diff - $76  BMP - $99  Mg - $79    Lines:     Active PICC Line / CVC Line / PIV Line / Drain / Airway / Intraosseous Line / Epidural Line / ART Line / Line / Wound / Pressure Ulcer / NG/OG Tube     Name   Placement date   Placement time   Site   Days    PICC Double Lumen 10/02/13 Right Basilic  10/02/13   1743   Basilic   2    ETT  8 mm          --    Incision Site 09/30/13 Head  09/30/13   2300     4           Disposition:     Today's date: 10/05/2013  Length of Stay: 5  Anticipated medical stability for discharge: TBD  Reason for ongoing hospitalization: brain abscess  Anticipated discharge needs: tbd    Subjective     CC: brain abscess    HPI/Subjective: 47 yo M PMH DM, prior sinus infections and sinus surgery, HLD, OSA, on antibiotics and steroids for sinus infection admitted with MS change to [name] on 12/5. The patient c/o HA, fatigue, lethargy. Head CT showed abscess R frontal lobe, vasogenic  edema, mass effect, early mild hydrocephalus, and subfalcine herniation. Facial bone CT showed bilateral acute ethmoidal sinusitis and chronic changes to the nose and ethmoid and sphenoid sinuses. He underwent craniotomy on 12/6 and cx revealed MSSA and antibiotics narrowed to ceftriaxone.  Head CT revealed increased cerebral edema. On 12/9 3% d/c'd and started on free water restriction.      As per family, pt has been lethargic in the am and improved in the afternoon.  Denies HA, weakness, numbness, tingling.  PO intake fair.  Pt has a hx of OSA but has not been on CPAP during his hospitalization.      Review of Systems:     Negative except as per hpi    Physical Exam:     VITAL SIGNS PHYSICAL EXAM   Temp:  [97.8 F (36.6 C)-98.2 F (36.8 C)] 98.1 F (36.7 C)  Heart Rate:  [45-72] 66   Resp Rate:  [10-29] 29   BP: (98-159)/(57-85) 124/77  mmHg      Telemetry: NSR      Intake/Output Summary (Last 24 hours) at 10/05/13 1722  Last data filed at 10/05/13 1500   Gross per 24 hour   Intake    850 ml   Output      0 ml   Net    850 ml    Physical Exam  General: awake, alert X 3, NAD, slow to respond.  HEENT:  Sutures-c/d/i  Cardiovascular: regular rate and rhythm, no murmurs, rubs or gallops  Lungs: clear to auscultation bilaterally, without wheezing, rhonchi, or rales  Abdomen: soft, non-tender, non-distended; no palpable masses,  normoactive bowel sounds  Neuro: AO*3, motor 5/5, sensation intact  Extremities: no edema      RUE PICC       Meds:     Medications were reviewed:    Labs:     Labs (last 72 hours):      Lab 10/05/13 0431 10/04/13 0400   WBC 11.97* 17.42*   HGB 12.7* 13.6   HCT 38.6* 41.8*   PLT 241 300         Lab 09/30/13 1824   PT 12.9   INR 1.0   PTT --      Lab 10/05/13 1658 10/05/13 1039 10/05/13 0431 10/04/13 0400   NA 139 138 -- --   K -- -- 4.2 4.1   CL -- -- 107 109*   CO2 -- -- 25 21*   BUN -- -- 21.0 29.0*   CREAT -- -- 0.7 0.7   CA -- -- 8.0* 8.3*   ALB -- -- -- --   PROT -- -- -- --   BILITOTAL  -- -- -- --   ALKPHOS -- -- -- --   ALT -- -- -- --   AST -- -- -- --   GLU -- -- 230* 230*                   Microbiology, reviewed and are significant for:  12/5 wound cx-MSSA    Imaging, reviewed and are significant for:  CT head  Postsurgical changes from recent evacuation of right frontal   intracerebral abscess. Evaluation is limited secondary to noncontrast   CT technique; however a residual collection containing air, fluid and   hemorrhage may be present within the right frontal lobe measuring   approximately 4.4 x 3.0 cm.        Signed by: Verdene Lennert, MD

## 2013-10-05 NOTE — Progress Notes (Signed)
For outpatient follow up with ENT in 2-4 weeks after discharge:    Dr. Reina Fuse  [phone]    Wesley Desanctis, PGY5  Staff: Dr. Cheyenne Adas

## 2013-10-05 NOTE — Progress Notes (Signed)
Neurosurgery Progress Note  POD 5  S:   No new complaints    O:   Filed Vitals:    10/05/13 0600   BP: 112/72   Pulse: 51   Temp:    Resp: 11   SpO2: 96%         PHYSICAL EXAM:  Awake  Eyes openo  oriented x3 but response somewhat delayed  Speech fluent, follows commands 100%   PERRL, OU 2mm, TML   Slight left facial droop.   MAE, BUE and BLE 5/5 strength      Head CT yesterday shows postoperative changes with persistent edema and local mass effect    A/P:   Neuro stable  Continue medical treatment  Mobilize        Rella Larve, MD

## 2013-10-05 NOTE — Progress Notes (Signed)
Neurocritical Care Daily Progress Note    Patient Name: [facility]  Room:F-7/.05 Date Time: 10/05/2013 12:34 PM   Admit Date: 09/30/2013    Hospital LOS# 5    24 hr Events: No events overnight. Can likely transfer to floor today.     Presentation/Hospital course:   47 yo M PMH DM, prior sinus infections and sinus surgery, HLD, OSA, on antibiotics and steroids for sinus infection admitted with MS change to [facility] on 12/5.  The patient c/o HA, fatigue, lethargy.  Head CT showed abscess R frontal lobe, vasogenic edema, mass effect, early mild hydrocephalus, and subfalcine herniation.  Facial bone CT showed bilateral acute ethmoidal sinusitis and chronic changes to the nose and ethmoid and sphenoid sinuses.    12/6: Craniotomy for drainage of yellow, purulent material  12/8: CSF with MSSA, Vanco d/c'd. CT with inc cerebral edema.   12/9: 3% d/c'd. 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  CSF with MSSA- IV ceftriazone at meningitis dose  Vanc d/c'd  Decadron taper started, decreased to 4mg  Q12h  Nsurg and ENT following  Follow up ENT outpatient.  FU head CT with significant cerebral edema and small hemorrhage at surgical site    -Goals:   Perfusion goals: SBP < 160 mm Hg  Na+ goal: 140-145. 3% d/c'd'. Salt tabs and free water restriction  Sedation: PO pain meds and PRN dilaudid        PT/OT/Speech/swallow eval: advance activity    CV: bradycardic at times, Sinus Brady, stable BP    PULM: IS, uses bipap at night at home    GI: mechanical altered, tolerating well  GI PPX: pepcid  Nutrition: PO    RENAL: 150cc/day free water restriction  I/O Goal: even, no foley    ID:  Afebrile, leukocytosis, down trending  Appreciate ID recs.  Cultures- MSSA, ceftriaxone 2g  Q12h    HEME:  SCD's  Pharmacologic ppx: Lovenox    ENDO/RHEUM: hyperglycemia- Lantus 40 units, high SSI  Improving with steroid taper and increased lantus  Glucose: goal 100 -180,     Lines/Foley: PICC 12/7  Daily labs/imaging: yes  Family Meeting: Wife at bedside and updated    VITALS, LINES, IMAGING, LABS, MICRO, MEDS, GOALS reviewed and d/w bedside nurse                   Code status Full Code  DISPO: transfer to floor this afternoon.     Physical Exam:   Vitals reviewed in EPIC  Filed Vitals:    10/05/13 0600 10/05/13 0800 10/05/13 1017 10/05/13 1200   BP: 112/72 117/72 117/57 127/73   Pulse: 51 45 59 61   Temp:  98.2 F (36.8 C)     TempSrc:  Oral     Resp: 11 11 21 14    Height:       Weight:       SpO2: 96% 96% 96% 96%       General Appearance:   Vent:   Lines: PICC 12/7  Foley: no  Surgical sites: c/d/i  NEURO EXAM:  Mental Status:  Sedation: none              Opens eyes: spont  Orientation: AAOX3 but slow to respond  Language:  Fluent, knows place, time, president    Cranial Nerves  Pupils  OS: size/reactivity: 2R              OD: size/reactivity: 2R                    EOM(III/IV/VI): intact                          Visual field: full         Corneal(V/VII): intact        Face(VII): mild left facial droop  Shoulder shrug/head turn(XI): intact         Cough/gag(IX/X): intact                     Uvula (IX/X):      upgoing         Tongue(XII):     ML    Motor:    UE   Deltoid  Bicep  Tricep  WE  WF  Grip    R  5/5         L 5/5           LE   Hip Flex  Hip Ext  Knee F  Knee E  DF  PF    R  5/5         L 5/5           Drift:  none    Sensory:  Intact LT all ext  Cerebellar: no dysmetria  Reflexes/Babinki:    Other:   Bedside ultrasound:      Heart: regular nl S1 S2 no murmurs       Lungs:CTAB        Abd: soft NT ND nl bs        Ext: no edema, palp periph pulses        Medications:   Scheduled Meds: reviewed     Labs:   reviewed      Rads:   Radiological Imaging personally reviewed:No new imaging    Above  plans reviewed with [facility] attending Dr. Donn Pierini     I have spent 40 minutes in evaluation, review, and implementation of the above plans excluding procedures.        Signed by: Ether Griffins, MPAS, PA-C     Date/Time: 10/05/2013 12:34 PM

## 2013-10-05 NOTE — Progress Notes (Signed)
10/05/2013 8:43 AM  [name],[name]      Updated problem List   Acute Problem List:   Frontal brain abscess with midline shift   --drainage of brain abscess 09/30/13 ; Culture +MSSA   -------------------------------------------   Hx multiple sinus surgeries craniotomy in 2003; 2008 for sinusitis with purulence   Allergy PCN   --------------------------------------------   Chronic:   DM   HLD   Sleep apnea     Assessment:   Tolerating IV ceftriaxone, seems to be recovering from surgery ok. Affect is flat, hard to know how much is baseline vs. Post op changes.  Infection issues seem to be coming under control. Tolerating simplified IV antibiotics.  Will continue several weeks, with guidance by clinical follow up and head imaging.    Recommendations:   Continue ceftriaxone at meningitis level dosing  Can follow with [place] through office, when ready to go home    Antibiotics:   D#6 (D#3 Ceftriaxone 2 gm IV Q12H)    IV lines:   R PICC ; 10/02/13    A risk-benefit assessment was entertained and the ongoing use of the catheter is warranted for IV access and the infusion of intravenous medications.    Family History:   No family history on file.    Social History:     History     Social History   . Marital Status: Married     Spouse Name: N/A     Number of Children: N/A   . Years of Education: N/A     Social History Main Topics   . Smoking status: Light Tobacco Smoker   . Smokeless tobacco: Never Used      Comment: Occasional Cigars   . Alcohol Use: 0.0 oz/week     4-5 Cans of beer per week   . Drug Use: No   . Sexually Active:      Other Topics Concern   . Not on file     Social History Narrative   . No narrative on file       Allergies:     Allergies   Allergen Reactions   . Morphine    . Penicillins        Review of Systems:   General ROS: negative for - chills, fevers, night sweats, weight loss   HEENT: negative for - blurry vision, sore throat, thrush, admits to headache   Respiratory ROS: negative for cough,  SOB  Cardiovascular ROS: negative for - chest pain, palpitations   Gastrointestinal ROS: negative for - abdominal pain, nausea, vomiting, diarrhea  Genito-Urinary ROS: negative for - dysuria, urinary frequency/urgency   Musculoskeletal ROS: negative for - joint pain, joint stiffness or muscle pain   Dermatological ROS: negative for - rash and skin lesion changes   Neurological ROS: slow movements, headache  Hematological ROS: negative for - bruising, bleeding   Psychological ROS: negative for - withdrawn    Physical Exam:   BP 112/72  Pulse 51  Temp 97.8 F (36.6 C) (Axillary)  Resp 11  Ht 1.778 m (5\' 10" )  Wt 124.7 kg (274 lb 14.6 oz)  BMI 39.45 kg/m2  SpO2 96%  Awake, alert, but withdrawn, slow speech, flat affect, craniotomy incision clean and closed, no drainage or cellulitis. Lungs clear, CV normal, abdomen obese, soft nontender, extrem ok, 1+ edema, line site ok  Select labs:       Lab 10/05/13 0431   WBC 11.97*   HGB 12.7*   HCT 38.6*  PLT 241       Lab 10/05/13 0431   NA 139   K 4.2   CL 107   CO2 25   BUN 21.0   CREAT 0.7   CA 8.0*   ALB --   PROT --   BILITOTAL --   ALKPHOS --   ALT --   AST --   GLU 230*        Rads:   none    Attestations:     I certify that I personally reviewed the interim medical record, interviewed the patient, performed a physical examination, as documented above, reviewed the medications being actively administered, and reviewed the referenced laboratory and radiographic studies.  I have discussed my thoughts with the patient and with relevant medical team members.  I have given thought to the complex medical conditions present and have endeavored to balance the interventions required by the acute conditions with the potential toxicities of the medications and procedures on the patient's well being and on the status of the other chronic conditions. I have considered the potential drug interactions between anti-microbial agents we have recommended and other medications  required by the patient.   I have documented my recommendations for the primary attending, the medical trainees and the other medical and/or surgical consultants involved in caring for the patient.  A total of35  minutes were spent in the care of this patient.    Signed by: Guadalupe Maple, MD

## 2013-10-06 LAB — BASIC METABOLIC PANEL
BUN: 22 mg/dL — ABNORMAL HIGH (ref 9.0–21.0)
CO2: 27 mEq/L (ref 22–29)
Calcium: 8 mg/dL — ABNORMAL LOW (ref 8.5–10.5)
Chloride: 104 mEq/L (ref 98–107)
Creatinine: 0.7 mg/dL (ref 0.7–1.3)
Glucose: 253 mg/dL — ABNORMAL HIGH (ref 70–100)
Potassium: 4 mEq/L (ref 3.5–5.1)
Sodium: 136 mEq/L (ref 136–145)

## 2013-10-06 LAB — GLUCOSE WHOLE BLOOD - POCT
Whole Blood Glucose POCT: 280 mg/dL — ABNORMAL HIGH (ref 70–100)
Whole Blood Glucose POCT: 206 mg/dL — ABNORMAL HIGH (ref 70–100)
Whole Blood Glucose POCT: 234 mg/dL — ABNORMAL HIGH (ref 70–100)
Whole Blood Glucose POCT: 261 mg/dL — ABNORMAL HIGH (ref 70–100)
Whole Blood Glucose POCT: 313 mg/dL — ABNORMAL HIGH (ref 70–100)

## 2013-10-06 LAB — CBC
Hematocrit: 39.4 % — ABNORMAL LOW (ref 42.0–52.0)
Hgb: 13.2 g/dL (ref 13.0–17.0)
MCH: 29.7 pg (ref 28.0–32.0)
MCHC: 33.5 g/dL (ref 32.0–36.0)
MCV: 88.7 fL (ref 80.0–100.0)
MPV: 10.6 fL (ref 9.4–12.3)
Nucleated RBC: 0 (ref 0–1)
Platelets: 230 (ref 140–400)
RBC: 4.44 — ABNORMAL LOW (ref 4.70–6.00)
RDW: 13 % (ref 12–15)
WBC: 13.3 — ABNORMAL HIGH (ref 3.50–10.80)

## 2013-10-06 LAB — SODIUM: Sodium: 136 mEq/L (ref 136–145)

## 2013-10-06 LAB — GFR: EGFR: >60

## 2013-10-06 MED ORDER — METFORMIN HCL 500 MG PO TABS
1000.0000 mg | ORAL_TABLET | Freq: Two times a day (BID) | ORAL | Status: DC
Start: 2013-10-06 — End: 2013-10-07
  Administered 2013-10-06 – 2013-10-07 (×3): 1000 mg via ORAL
  Filled 2013-10-06 (×3): qty 2

## 2013-10-06 MED ORDER — GLIPIZIDE 5 MG PO TABS
2.5000 mg | ORAL_TABLET | Freq: Two times a day (BID) | ORAL | Status: DC
Start: 2013-10-06 — End: 2013-10-07
  Administered 2013-10-06: 2.5 mg via ORAL
  Filled 2013-10-06: qty 1

## 2013-10-06 MED ORDER — DEXAMETHASONE 1 MG PO TABS
2.0000 mg | ORAL_TABLET | Freq: Two times a day (BID) | ORAL | Status: DC
Start: 2013-10-06 — End: 2013-10-07
  Administered 2013-10-06 – 2013-10-07 (×2): 2 mg via ORAL
  Filled 2013-10-06 (×2): qty 2

## 2013-10-06 MED ORDER — HYDROMORPHONE HCL PF 1 MG/ML IJ SOLN
0.5000 mg | INTRAMUSCULAR | Status: DC | PRN
Start: 2013-10-06 — End: 2013-10-07
  Administered 2013-10-06: 0.5 mg via INTRAVENOUS
  Filled 2013-10-06: qty 1

## 2013-10-06 MED ORDER — SODIUM CHLORIDE 1 G PO TABS
1.0000 g | ORAL_TABLET | Freq: Three times a day (TID) | ORAL | Status: DC
Start: 2013-10-06 — End: 2013-10-07
  Administered 2013-10-06: 1 g via ORAL
  Filled 2013-10-06: qty 1

## 2013-10-06 MED ORDER — INSULIN GLARGINE 100 UNIT/ML SC SOLN
30.0000 [IU] | Freq: Every morning | SUBCUTANEOUS | Status: DC
Start: 2013-10-07 — End: 2013-10-07

## 2013-10-06 MED ORDER — OXYCODONE HCL ER 10 MG PO T12A
10.0000 mg | EXTENDED_RELEASE_TABLET | Freq: Two times a day (BID) | ORAL | Status: DC
Start: 2013-10-06 — End: 2013-10-07
  Administered 2013-10-06 – 2013-10-07 (×3): 10 mg via ORAL
  Filled 2013-10-06 (×3): qty 1

## 2013-10-06 NOTE — Progress Notes (Deleted)
[facility] Inpatient Rehab Note:  Following patient for acute inpatient rehab at [facility]  Will follow patient for rehab appropriateness and medical stability.    Interested, but need more information.     CM/SW notified Bayard Hugger, RN  [phone]

## 2013-10-06 NOTE — Progress Notes (Signed)
[facility] HOSPITALIST PROGRESS NOTE    Date Time: 10/06/2013 2:07 PM  Patient Name: [facility]  Attending Physician: Ignacia Felling, MD      Assessment:     Patient Active Problem List   Diagnosis   . Brain abscess   history of sinusitis  DM  OSA  HLD    Plan:   R frontal brain abscess, MSSA s/p craniotomy, removal of SDH. Will need ENT f/u. Will continue to taper steroids. Na tablet taper    Headache- start oxycontin, prn fioricet    Uncontrolled DM- states he was on 2 po meds at home, continue lantus. Will start Metformin and glipizide    OSA- will order home CPAP    ppx- Lovenox    Discussed with pt and RN. Attempted to call wife Lawson Fiscal but went to voice mail    Anticipated discharge disposition and date: 1 day to acute rehab. Will need TCM appointment      CC: brain abscess    Interval History/24 hour events: flat affect. Has been having severe HA when he tries to do anything up to 6-7/10. States he has brown sugar from his pores      Physical Exam:     Filed Vitals:    10/06/13 1106   BP: 121/68   Pulse: 68   Temp: 97 F (36.1 C)   Resp: 16   SpO2: 96%     Intake and Output Summary (Last 24 hours) at Date Time    Intake/Output Summary (Last 24 hours) at 10/06/13 1407  Last data filed at 10/06/13 0528   Gross per 24 hour   Intake    250 ml   Output      0 ml   Net    250 ml       General: awake, alert, ; no acute distress.  HEENT: perrla, eomi, sclera anicteric  oropharynx clear without lesions, mucous membranes moist  Neck: supple  Cardiovascular: regular rate and rhythm, no murmurs, rubs or gallops  Lungs: clear to auscultation bilaterally, without wheezing, rhonchi, or rales  Abdomen: soft, non-tender, non-distended; no palpable masses, no hepatosplenomegaly, normoactive bowel sounds, no rebound or guarding  Extremities: no clubbing, cyanosis, or edema  Neuro: cranial nerves grossly intact, speech fluent, normal tone, strength 5/5 in upper and lower extremities, sensation intact,   Skin: staples c/d/i    Meds:      Current Facility-Administered Medications   Medication Dose Route Frequency   . cefTRIAXone  2 g Intravenous Q12H SCH   . dexamethasone  4 mg Oral Q12H   . enoxaparin  40 mg Subcutaneous Daily   . famotidine  20 mg Oral Q12H SCH   . insulin glargine  40 Units Subcutaneous QAM   . senna-docusate  2 tablet Oral QHS   . simvastatin  40 mg per OG tube Daily with dinner   . sodium chloride  2 g Oral TID MEALS       PRN MEDICATIONS:  acetaminophen, butalbital-acetaminophen-caffeine, chlorproMAZINE IVPB, dextrose, dextrose, dextrose, glucagon (rDNA), HYDROmorphone, insulin regular, ondansetron, oxyCODONE, saline, [DISCONTINUED] oxyCODONE      Labs:     Results     Procedure Component Value Units Date/Time    Sodium [[id number]] Collected:10/06/13 1118    Specimen Information:Blood Updated:10/06/13 1148     Sodium 136 mEq/L     Glucose Whole Blood - POCT [[id number]]  (Abnormal) Collected:10/06/13 1104     POCT - Glucose Whole blood 234 (H) mg/dL NWGNFAO:13/08/65 7846  Anaerobic culture [[id number]] Collected:09/30/13 2230    Specimen Information:Other / Tissue Updated:10/06/13 1112    Narrative:    ORDER#: [id number]                                    ORDERED BY: LIM, JAE  SOURCE: Tissue right frontal brain abscess wal       COLLECTED:  09/30/13 22:30  ANTIBIOTICS AT COLL.:                                RECEIVED :  10/01/13 00:42  Culture Anaerobic                          PRELIM      10/06/13 11:12   +  10/03/13   No growth to date, final report to follow  10/06/13   Very light growth of Anaerobic Gram negative rod               Refer to culture # [id number]        Anaerobic culture [[id number]] Collected:09/30/13 2230    Specimen Information:Other / Abscess Updated:10/06/13 1052    Narrative:    ORDER#: [id number]                                    ORDERED BY: LIM, JAE  SOURCE: Abscess brain                                COLLECTED:  09/30/13 22:30  ANTIBIOTICS AT COLL.:                                RECEIVED :   10/01/13 00:46  Culture Anaerobic                          PRELIM      10/06/13 10:51   +  10/03/13   No growth to date, final report to follow  10/06/13   Light growth of Anaerobic Gram negative rod               Identification to follow        Glucose Whole Blood - POCT [[id number]]  (Abnormal) Collected:10/06/13 0744     POCT - Glucose Whole blood 206 (H) mg/dL VZDGLOV:56/43/32 9518    Basic Metabolic Panel [[id number]]  (Abnormal) Collected:10/06/13 0525    Specimen Information:Blood Updated:10/06/13 0620     Glucose 253 (H) mg/dL      BUN 84.1 (H) mg/dL      Creatinine 0.7 mg/dL      CALCIUM 8.0 (L) mg/dL      Sodium 660 mEq/L      Potassium 4.0 mEq/L      Chloride 104 mEq/L      CO2 27 mEq/L     GFR [[id number]] Collected:10/06/13 0525     EGFR >60.0 Updated:10/06/13 0620    CBC without differential [[id number]]  (Abnormal) Collected:10/06/13 0525    Specimen Information:Blood / Blood Updated:10/06/13 0606     WBC 13.30 (H)      RBC 4.44 (L)  Hgb 13.2 g/dL      Hematocrit 54.0 (L) %      MCV 88.7 fL      MCH 29.7 pg      MCHC 33.5 g/dL      RDW 13 %      Platelets 230      MPV 10.6 fL      Nucleated RBC 0     Glucose Whole Blood - POCT [[id number]]  (Abnormal) Collected:10/06/13 0321     POCT - Glucose Whole blood 280 (H) mg/dL GNFAOZH:08/65/78 4696    Sodium [[id number]] Collected:10/05/13 2322    Specimen Information:Blood Updated:10/05/13 2356     Sodium 136 mEq/L     Sodium [[id number]] Collected:10/05/13 1658    Specimen Information:Blood Updated:10/05/13 1715     Sodium 139 mEq/L     Glucose Whole Blood - POCT [[id number]]  (Abnormal) Collected:10/05/13 1618     POCT - Glucose Whole blood 253 (H) mg/dL QQVZDGL:87/56/43 3295    Culture Blood, Aerobic [[id number]] Collected:10/01/13 0834    Specimen Information:Blood / Blood, Venipuncture Updated:10/05/13 1415    Narrative:    ORDER#: [id number]                                    ORDERED BY: Royetta Asal  SOURCE: Blood, Venipuncture blvp                      COLLECTED:  10/01/13 08:34  ANTIBIOTICS AT COLL.:                                RECEIVED :  10/01/13 13:24  Culture Blood                              PRELIM      10/05/13 14:15  10/02/13   No Growth after 1 day/s of incubation.  10/03/13   No Growth after 2 day/s of incubation.  10/04/13   No Growth after 3 day/s of incubation.  10/05/13   No Growth after 4 day/s of incubation.      Culture Blood, Aerobic [[id number]] Collected:10/01/13 0834    Specimen Information:Blood / Blood, Venipuncture Updated:10/05/13 1415    Narrative:    ORDER#: [id number]                                    ORDERED BY: Royetta Asal  SOURCE: Blood, Venipuncture blvp                     COLLECTED:  10/01/13 08:34  ANTIBIOTICS AT COLL.:                                RECEIVED :  10/01/13 13:24  Culture Blood                              PRELIM      10/05/13 14:15  10/02/13   No Growth after 1 day/s of incubation.  10/03/13   No Growth after 2 day/s of incubation.  10/04/13   No Growth after 3 day/s of incubation.  10/05/13  No Growth after 4 day/s of incubation.              Rads:     Imaging personally reviewed, including:   Radiology Results (24 Hour)     ** No Results found for the last 24 hours. **            All pertinent labs, medications, radiology studies, vitals reviewed.          Signed by: Ignacia Felling, MD  [phone]

## 2013-10-06 NOTE — Progress Notes (Signed)
NEUROSURGERY PROGRESS NOTE    Date Time: 10/06/2013 12:47 PM  Patient Name: [name] Brown  Consulting Attending Physician: Dr. Vivia Budge  Covered By: [facility] [place] [id number]    Interim History:   POD 6  C/o headache 7/10 but no nausea, vomiting, changes in vision. No new neurological complaints.    Medications:     Current Facility-Administered Medications   Medication Dose Route Frequency   . cefTRIAXone  2 g Intravenous Q12H SCH   . dexamethasone  4 mg Oral Q12H   . enoxaparin  40 mg Subcutaneous Daily   . famotidine  20 mg Oral Q12H SCH   . insulin glargine  40 Units Subcutaneous QAM   . senna-docusate  2 tablet Oral QHS   . simvastatin  40 mg per OG tube Daily with dinner   . sodium chloride  2 g Oral TID MEALS         Physical Exam:     Filed Vitals:    10/06/13 1106   BP: 121/68   Pulse: 68   Temp: 97 F (36.1 C)   Resp: 16   SpO2: 96%       Intake and Output Summary (Last 24 hours) at Date Time    Intake/Output Summary (Last 24 hours) at 10/06/13 1247  Last data filed at 10/06/13 0528   Gross per 24 hour   Intake    250 ml   Output      0 ml   Net    250 ml     Neuro exam:  Awake, alert, eating lunch with wife at bedside  oriented x3 but response somewhat delayed   Speech fluent, follows commands 100%   PERRL, OU 2mm, TML   Slight left facial droop.   MAE, BUE and BLE 5/5 strength    Incision: Crani staples are c/d/i    Labs:     Lab Results   Component Value Date    WBC 13.30* 10/06/2013    HGB 13.2 10/06/2013    HCT 39.4* 10/06/2013    MCV 88.7 10/06/2013    PLT 230 10/06/2013     Lab Results   Component Value Date    NA 136 10/06/2013    K 4.0 10/06/2013    CL 104 10/06/2013    CO2 27 10/06/2013     Lab Results   Component Value Date    INR 1.0 09/30/2013    PT 12.9 09/30/2013       Rads:     Radiology Results (24 Hour)     ** No Results found for the last 24 hours. **          Assessment:   47 yo male presented with AMS x 1 day found to have a large right frontal lesion measuring 4.7 x 5.9 cm with large amount  of air and fluid layering suggestive of abscess with history of acute-on-chronic sinusitis and multiple prior sinus surgeries, cerebral edema with anterior leftward midline shift of 2 cm with lateral ventricular effacement and uncal herniation s/p craniotomy for removal of SDH is now POD #6      Plan:   Neuro stable   Continue medical treatment   Mobilize    Signed by: Rolinda Roan PA-C  Date/Time: 10/06/2013 12:47 PM

## 2013-10-06 NOTE — Progress Notes (Addendum)
Pt recommended for acute rehab. Referrals placed into [facility]. Will discuss with pt.      Discharge Clinic Appointment for: Monday, October 31, 2012 9:30am    Bonna Gains, LGSW  Social Worker, Stroke Unit  [phone]

## 2013-10-06 NOTE — SLP Progress Note (Signed)
[facility]   SLP Treatment Note  Patient: [name] Brown    MRN#: [id number]     Treatment Type: dysphagia follow up    Recommendations/Plan:   - Diet Recommendations: regular/thins, crush meds  - Aspiration Precautions: Awake/alert;Upright 90 degrees for all oral intake;Eat/feed slowly      SLP Frequency Recommended: 2x per week  SLP - Next Visit Recommended: 10/11/13    Assessment:   Pt presents with slight improvement in swallow function since last seen. Pt presents with mild oral and suspected pharyngeal dysphagia. Recommend upgrading pt's diet to regular/thins with aspiration precautions. SLP will continue to follow for diet tolerance.     Subjective:   Pain: pt reported head pain near staple, RN notified.   Current Diet:mechanically altered/thins   Respiratory Status: room air  Precautions: aspiration  RN cleared pt for tx. Wwife present throughout session. Pt agreeable to PO trials. Decreased vocal intensity throughout session.   Objective:   Objective:   Pt accepted PO trials of ice, thins, puree and regular solids. Pt with prolonged oral phase. Prolonged mastication with bilateral oral residuals with regular solids. Residuals cleared with liquid wash. Increased AP transfer time. Weak throat clear x1 following sequential swallows of thins. No other instance of overt s/s of aspiration observed.        Patient Education: verbally educated pt/wife/RN. All verbalized understanding      Goals:   Pt will tolerate a dysphagia mech altered diet with thin liquids with no s/s of aspiration x3-5 days. Goal met  Pt will masticate and clear regular trials with no s/s of aspiration 5/5xs. Goal met  Pt will tolerate a mechanical soft/thin liquid diet x72 hours without overt s/s of aspiration. Goal met  Pt will tolerate regular/thin liquid diet x72 hours without overt s/s of aspiration. New goal  [name], [place] CCC-SLP, [id number]    Time of Treatment:  SLP Received On: 10/06/13  Start Time: 1255  Stop Time:  1320  Time Calculation (min): 25 min

## 2013-10-06 NOTE — Plan of Care (Signed)
Pt alert and oriented x 3,follows command, denies any cp or sob.  C/o headache, tolerates meds and diet.  No new complaints at this time. Wife at bedside.  Pt ambulates with assist.   Pt resting in bed,call bell within reach.  Will continue to monitor pt and pt's safety.

## 2013-10-06 NOTE — Progress Notes (Signed)
PROGRESS NOTE    Date Time: 10/06/2013 5:35 PM  Patient Name: [facility]    Updated problem List      Acute Problem List:   Frontal brain abscess with midline shift   --drainage of brain abscess 09/30/13 ; Culture +MSSA   -------------------------------------------   Hx multiple sinus surgeries craniotomy in 2003; 2008 for sinusitis with purulence   Allergy PCN   --------------------------------------------   Chronic:   DM   HLD   Sleep apnea     Allergies:      Allergies    Allergen  Reactions    .  Morphine     .  Penicillins       Review of Systems:    General ROS: negative for - chills, fevers, night sweats, weight loss   HEENT: negative for - blurry vision, sore throat, thrush, admits to headache   Respiratory ROS: negative for cough, SOB   Cardiovascular ROS: negative for - chest pain, palpitations   Gastrointestinal ROS: negative for - abdominal pain, nausea, vomiting, diarrhea   Genito-Urinary ROS: negative for - dysuria, urinary frequency/urgency   Musculoskeletal ROS: negative for - joint pain, joint stiffness or muscle pain   Dermatological ROS: negative for - rash and skin lesion changes   Neurological ROS: slow movements, headache   Hematological ROS: negative for - bruising,       Assessment:   C/O headache    Flat affect    WBC 13,300    Plan:   IV Ceftriaxone at CNS dosage for several weeks which can be continued as an outpatient post discharge    Subjective:   Headache    Antibiotics:   Day 7 total    Day 4 Ceftriaxone 2 Grams q 12 h IV    All other medications reviewed    Lines:   R PICC ; 10/02/13      Physical Exam:     Filed Vitals:    10/06/13 1703   BP: 123/76   Pulse: 65   Temp: 97.5 F (36.4 C)   Resp: 16   SpO2: 97%     Afebrile;  Alert and talkative  Flat affect  Cranial incision clean  Neck supple  Lungs clear  Heart with regular rhythm and no murmur  Abdomen  Soft with active bowel sounds  No leg edema  No skin lesions      Labs:     Results     Procedure Component Value Units Date/Time     Glucose Whole Blood - POCT [[id number]]  (Abnormal) Collected:10/06/13 1704     POCT - Glucose Whole blood 261 (H) mg/dL XBJYNWG:95/62/13 0865    Culture Blood, Aerobic [[id number]] Collected:10/01/13 0834    Specimen Information:Blood / Blood, Venipuncture Updated:10/06/13 1615    Narrative:    ORDER#: [id number]                                    ORDERED BY: Royetta Asal  SOURCE: Blood, Venipuncture blvp                     COLLECTED:  10/01/13 08:34  ANTIBIOTICS AT COLL.:                                RECEIVED :  10/01/13 13:24  Culture Blood  FINAL       10/06/13 16:15  10/06/13   No growth after 5 days of incubation.      Culture Blood, Aerobic [[id number]] Collected:10/01/13 0834    Specimen Information:Blood / Blood, Venipuncture Updated:10/06/13 1615    Narrative:    ORDER#: [id number]                                    ORDERED BY: Royetta Asal  SOURCE: Blood, Venipuncture blvp                     COLLECTED:  10/01/13 08:34  ANTIBIOTICS AT COLL.:                                RECEIVED :  10/01/13 13:24  Culture Blood                              FINAL       10/06/13 16:15  10/06/13   No growth after 5 days of incubation.      Sodium [[id number]] Collected:10/06/13 1118    Specimen Information:Blood Updated:10/06/13 1148     Sodium 136 mEq/L     Glucose Whole Blood - POCT [[id number]]  (Abnormal) Collected:10/06/13 1104     POCT - Glucose Whole blood 234 (H) mg/dL GEXBMWU:13/24/40 1027    Anaerobic culture [[id number]] Collected:09/30/13 2230    Specimen Information:Other / Tissue Updated:10/06/13 1112    Narrative:    ORDER#: [id number]                                    ORDERED BY: LIM, JAE  SOURCE: Tissue right frontal brain abscess wal       COLLECTED:  09/30/13 22:30  ANTIBIOTICS AT COLL.:                                RECEIVED :  10/01/13 00:42  Culture Anaerobic                          PRELIM      10/06/13 11:12   +  10/03/13   No growth to date, final report to  follow  10/06/13   Very light growth of Anaerobic Gram negative rod               Refer to culture # [id number]        Anaerobic culture [[id number]] Collected:09/30/13 2230    Specimen Information:Other / Abscess Updated:10/06/13 1052    Narrative:    ORDER#: [id number]                                    ORDERED BY: LIM, JAE  SOURCE: Abscess brain                                COLLECTED:  09/30/13 22:30  ANTIBIOTICS AT COLL.:  RECEIVED :  10/01/13 00:46  Culture Anaerobic                          PRELIM      10/06/13 10:51   +  10/03/13   No growth to date, final report to follow  10/06/13   Light growth of Anaerobic Gram negative rod               Identification to follow        Glucose Whole Blood - POCT [[id number]]  (Abnormal) Collected:10/06/13 0744     POCT - Glucose Whole blood 206 (H) mg/dL YSAYTKZ:60/10/93 2355    Basic Metabolic Panel [[id number]]  (Abnormal) Collected:10/06/13 0525    Specimen Information:Blood Updated:10/06/13 0620     Glucose 253 (H) mg/dL      BUN 70.6 (H) mg/dL      Creatinine 0.7 mg/dL      CALCIUM 8.0 (L) mg/dL      Sodium 237 mEq/L      Potassium 4.0 mEq/L      Chloride 104 mEq/L      CO2 27 mEq/L     GFR [[id number]] Collected:10/06/13 0525     EGFR >60.0 Updated:10/06/13 0620    CBC without differential [[id number]]  (Abnormal) Collected:10/06/13 0525    Specimen Information:Blood / Blood Updated:10/06/13 0606     WBC 13.30 (H)      RBC 4.44 (L)      Hgb 13.2 g/dL      Hematocrit 26.9 (L) %      MCV 88.7 fL      MCH 29.7 pg      MCHC 33.5 g/dL      RDW 13 %      Platelets 230      MPV 10.6 fL      Nucleated RBC 0     Glucose Whole Blood - POCT [[id number]]  (Abnormal) Collected:10/06/13 0321     POCT - Glucose Whole blood 280 (H) mg/dL JKKXFGH:82/99/37 1696    Sodium [[id number]] Collected:10/05/13 2322    Specimen Information:Blood Updated:10/05/13 2356     Sodium 136 mEq/L             Microbiology:   Reviewed    Rads:   Radiological Procedure  reviewed.    Signed by: Kathrine Haddock, MD

## 2013-10-06 NOTE — PT Progress Note (Signed)
[facility]   Physical Therapy Treatment  Patient:  [name] Brown MRN#:  [id number]  Unit: [facility]  Bed: F-6/.23    Discharge Recommendations:   D/C Recommendations: Acute Rehab   DME Recommendations: ongoing assessment    If Acute Rehab recommended discharge disposition is not available, patient will need hands on assist for all functional mobility, wheeled walker equipment, and  HHPT.        Assessment:   Pt continues with staggered gait, decreased balance, dizziness with head turns, impulsivity and mild intermittent agitation.  Given that pt was previously independent with all aspects of daily life, recommend acute rehab at d/c.    Treatment Activities: neuromuscular re ed, gait training, balance training    Educated the patient to role of physical therapy, plan of care, goals of therapy and HEP, safety with mobility and ADLs.    Plan:   PT Frequency: 3-4x/wk    Continue plan of care.  Goals extended 3 visits       Precautions and Contraindications:   Fall    Updated Medical Status/Imaging/Labs: chart reviewed    Subjective:   Patient's medical condition is appropriate for Physical Therapy intervention at this time.  Patient is agreeable to participation in the therapy session. Nursing clears patient for therapy.    Pain:   Scale: 7/10  Location: headache  Intervention: RN medicated      Objective:   Patient received in bed with IV access, and telemetry in place.    Cognition  A and O x 3.  Follows commands with delay.  Intermittent agitation, impulsivity.  Pt states issues with word finding.    Functional Mobility  Rolling: independent  Supine to Sit: CGA  Scooting: CGA  Sit to Stand: close CGA to Min A  Stand to Sit: CGA to Min A  Transfers: Min A    Ambulation  Level of Assistance required: Min A  Ambulation Distance: 61' x 3 with multiple standing rest breaks  Pattern: staggered gait both L and R, and especially with head turns.  Device Used: none  Weightbearing Status: no  restrictions      Balance  Static Sitting: good  Dynamic Sitting: good  Static Standing: fair  Dynamic Standing: fair-    Therapeutic Exercises  Worked on dynamic balance with narrowed base of support and changing positions of head.    Patient Participation: good  Patient Endurance: good    Patient left with call bell within reach, all needs met and all questions answered, RN notified of session outcome and patient response.     Goals:  Goals  Goal Formulation: With patient/family  Time for Goal Acheivement: 3 visits  Goals: Select goal  Pt Will Go Supine To Sit: Independently  Pt Will Transfer Bed/Chair: Independently  Pt Will Ambulate: > 200 feet;Independently  Pt Will Go Up / Down Stairs: 3-5 stairs;With stand by assist      Mitchel Honour) [place], PT, DPT  Pager [phone]      Time of Treatment:  PT Received On: 10/06/13  Start Time: 1200  Stop Time: 1230  Time Calculation (min): 30 min  Treatment # 3 out of 3 visits  Extended goals for 3 visits

## 2013-10-06 NOTE — Consults (Signed)
CT personally reviewed and surgery discussed with my neurosurgery colleague Dr. Jaynie Collins.  Plan to fu with our Rhinologist/Skullbase Surgeon to address ongoing sinus issues.  Dr. Reina Fuse [phone], is already aware of this gentleman.

## 2013-10-07 ENCOUNTER — Inpatient Hospital Stay: Payer: BLUE CROSS/BLUE SHIELD | Admitting: Physical Medicine & Rehabilitation

## 2013-10-07 ENCOUNTER — Inpatient Hospital Stay
Admission: RE | Admit: 2013-10-07 | Discharge: 2013-10-18 | DRG: 945 | Disposition: A | Discharge status: Home Health | Payer: BLUE CROSS/BLUE SHIELD | Source: Other Acute Inpatient Hospital | Attending: Physical Medicine & Rehabilitation | Admitting: Physical Medicine & Rehabilitation

## 2013-10-07 DIAGNOSIS — J309 Allergic rhinitis, unspecified: Secondary | ICD-10-CM | POA: Diagnosis present

## 2013-10-07 DIAGNOSIS — Z794 Long term (current) use of insulin: Secondary | ICD-10-CM

## 2013-10-07 DIAGNOSIS — G4733 Obstructive sleep apnea (adult) (pediatric): Secondary | ICD-10-CM | POA: Diagnosis present

## 2013-10-07 DIAGNOSIS — R1310 Dysphagia, unspecified: Secondary | ICD-10-CM | POA: Diagnosis present

## 2013-10-07 DIAGNOSIS — Z5189 Encounter for other specified aftercare: Principal | ICD-10-CM

## 2013-10-07 DIAGNOSIS — R51 Headache: Secondary | ICD-10-CM | POA: Diagnosis present

## 2013-10-07 DIAGNOSIS — G9340 Encephalopathy, unspecified: Secondary | ICD-10-CM

## 2013-10-07 DIAGNOSIS — E119 Type 2 diabetes mellitus without complications: Secondary | ICD-10-CM

## 2013-10-07 DIAGNOSIS — IMO0002 Reserved for concepts with insufficient information to code with codable children: Secondary | ICD-10-CM | POA: Diagnosis present

## 2013-10-07 DIAGNOSIS — Z6839 Body mass index (BMI) 39.0-39.9, adult: Secondary | ICD-10-CM

## 2013-10-07 DIAGNOSIS — E785 Hyperlipidemia, unspecified: Secondary | ICD-10-CM | POA: Diagnosis present

## 2013-10-07 DIAGNOSIS — G060 Intracranial abscess and granuloma: Secondary | ICD-10-CM | POA: Diagnosis present

## 2013-10-07 DIAGNOSIS — R4189 Other symptoms and signs involving cognitive functions and awareness: Secondary | ICD-10-CM | POA: Diagnosis present

## 2013-10-07 DIAGNOSIS — A4901 Methicillin susceptible Staphylococcus aureus infection, unspecified site: Secondary | ICD-10-CM | POA: Diagnosis present

## 2013-10-07 LAB — CBC
Hematocrit: 40.6 % — ABNORMAL LOW (ref 42.0–52.0)
Hgb: 13.4 g/dL (ref 13.0–17.0)
MCH: 29.6 pg (ref 28.0–32.0)
MCHC: 33 g/dL (ref 32.0–36.0)
MCV: 89.6 fL (ref 80.0–100.0)
MPV: 11 fL (ref 9.4–12.3)
Nucleated RBC: 0 (ref 0–1)
Platelets: 233 (ref 140–400)
RBC: 4.53 — ABNORMAL LOW (ref 4.70–6.00)
RDW: 13 % (ref 12–15)
WBC: 14 — ABNORMAL HIGH (ref 3.50–10.80)

## 2013-10-07 LAB — GLUCOSE WHOLE BLOOD - POCT
Whole Blood Glucose POCT: 220 mg/dL — ABNORMAL HIGH (ref 70–100)
Whole Blood Glucose POCT: 270 mg/dL — ABNORMAL HIGH (ref 70–100)
Whole Blood Glucose POCT: 241 mg/dL — ABNORMAL HIGH (ref 70–100)
Whole Blood Glucose POCT: 207 mg/dL — ABNORMAL HIGH (ref 70–100)

## 2013-10-07 LAB — SODIUM: Sodium: 138 mEq/L (ref 136–145)

## 2013-10-07 LAB — HEMOGLOBIN A1C: Hemoglobin A1C: 9.8 % — ABNORMAL HIGH (ref 0.0–6.0)

## 2013-10-07 MED ORDER — DEXTROSE 50 % IV SOLN
25.0000 mL | INTRAVENOUS | Status: DC | PRN
Start: 2013-10-07 — End: 2013-10-18

## 2013-10-07 MED ORDER — INSULIN ASPART 100 UNIT/ML SC SOLN
1.0000 [IU] | SUBCUTANEOUS | Status: DC | PRN
Start: 2013-10-07 — End: 2013-10-18
  Administered 2013-10-08 (×2): 4 [IU] via SUBCUTANEOUS
  Administered 2013-10-08: 7 [IU] via SUBCUTANEOUS
  Administered 2013-10-09: 2 [IU] via SUBCUTANEOUS
  Administered 2013-10-09 (×2): 4 [IU] via SUBCUTANEOUS
  Administered 2013-10-09 – 2013-10-11 (×8): 2 [IU] via SUBCUTANEOUS
  Administered 2013-10-12: 4 [IU] via SUBCUTANEOUS
  Administered 2013-10-12 – 2013-10-13 (×3): 2 [IU] via SUBCUTANEOUS
  Administered 2013-10-13: 4 [IU] via SUBCUTANEOUS
  Administered 2013-10-13: 7 [IU] via SUBCUTANEOUS
  Administered 2013-10-14 – 2013-10-18 (×6): 2 [IU] via SUBCUTANEOUS
  Filled 2013-10-07: qty 40
  Filled 2013-10-07 (×7): qty 20
  Filled 2013-10-07 (×2): qty 40
  Filled 2013-10-07: qty 20
  Filled 2013-10-07: qty 40
  Filled 2013-10-07 (×2): qty 20
  Filled 2013-10-07: qty 70
  Filled 2013-10-07: qty 40
  Filled 2013-10-07: qty 20
  Filled 2013-10-07: qty 70
  Filled 2013-10-07 (×3): qty 20
  Filled 2013-10-07: qty 40
  Filled 2013-10-07 (×5): qty 20

## 2013-10-07 MED ORDER — GLUCOSE 40 % PO GEL
15.0000 g | ORAL | Status: DC | PRN
Start: 2013-10-07 — End: 2013-10-07

## 2013-10-07 MED ORDER — SIMVASTATIN 40 MG PO TABS
40.0000 mg | ORAL_TABLET | Freq: Every day | ORAL | Status: DC
Start: 2013-10-08 — End: 2013-10-18
  Administered 2013-10-08 – 2013-10-17 (×10): 40 mg via ORAL
  Filled 2013-10-07 (×10): qty 1

## 2013-10-07 MED ORDER — METFORMIN HCL 500 MG PO TABS
1000.0000 mg | ORAL_TABLET | Freq: Two times a day (BID) | ORAL | Status: DC
Start: 2013-10-08 — End: 2013-10-18
  Administered 2013-10-08 – 2013-10-18 (×21): 1000 mg via ORAL
  Filled 2013-10-07 (×21): qty 2

## 2013-10-07 MED ORDER — OXYCODONE HCL ER 10 MG PO T12A
10.0000 mg | EXTENDED_RELEASE_TABLET | Freq: Two times a day (BID) | ORAL | Status: DC
Start: 2013-10-07 — End: 2013-10-17

## 2013-10-07 MED ORDER — INSULIN GLARGINE 100 UNIT/ML SC SOLN
35.0000 [IU] | Freq: Every morning | SUBCUTANEOUS | Status: DC
Start: 2013-10-08 — End: 2013-10-14
  Administered 2013-10-08 – 2013-10-14 (×7): 35 [IU] via SUBCUTANEOUS
  Filled 2013-10-07 (×7): qty 350

## 2013-10-07 MED ORDER — SALINE SPRAY 0.65 % NA SOLN
2.0000 | NASAL | Status: DC | PRN
Start: 2013-10-07 — End: 2013-10-18
  Filled 2013-10-07: qty 44

## 2013-10-07 MED ORDER — OXYCODONE HCL ER 10 MG PO T12A
10.0000 mg | EXTENDED_RELEASE_TABLET | Freq: Two times a day (BID) | ORAL | Status: DC
Start: 2013-10-07 — End: 2013-10-08
  Administered 2013-10-08 (×2): 10 mg via ORAL
  Filled 2013-10-07 (×2): qty 1

## 2013-10-07 MED ORDER — GLUCOSE 40 % PO GEL
15.0000 g | ORAL | Status: DC | PRN
Start: 2013-10-07 — End: 2013-10-18

## 2013-10-07 MED ORDER — INSULIN ASPART 100 UNIT/ML SC SOLN
1.0000 [IU] | SUBCUTANEOUS | Status: DC | PRN
Start: 2013-10-07 — End: 2013-10-07
  Administered 2013-10-07: 7 [IU] via SUBCUTANEOUS
  Administered 2013-10-07: 4 [IU] via SUBCUTANEOUS

## 2013-10-07 MED ORDER — DOCUSATE SODIUM 100 MG PO CAPS
100.0000 mg | ORAL_CAPSULE | Freq: Two times a day (BID) | ORAL | Status: DC
Start: 2013-10-07 — End: 2013-10-18
  Administered 2013-10-08 – 2013-10-18 (×20): 100 mg via ORAL
  Filled 2013-10-07 (×24): qty 1

## 2013-10-07 MED ORDER — SODIUM CHLORIDE 1 G PO TABS
1.0000 g | ORAL_TABLET | Freq: Two times a day (BID) | ORAL | Status: AC
Start: 2013-10-07 — End: 2013-10-07
  Administered 2013-10-07 (×2): 1 g via ORAL
  Filled 2013-10-07 (×2): qty 1

## 2013-10-07 MED ORDER — BISACODYL 10 MG RE SUPP
10.0000 mg | Freq: Every day | RECTAL | Status: DC | PRN
Start: 2013-10-07 — End: 2013-10-18

## 2013-10-07 MED ORDER — OXYCODONE HCL 5 MG PO TABS
10.0000 mg | ORAL_TABLET | ORAL | Status: DC | PRN
Start: 2013-10-07 — End: 2013-10-08
  Filled 2013-10-07: qty 2

## 2013-10-07 MED ORDER — SODIUM CHLORIDE 0.9 % IV MBP
2.0000 g | Freq: Two times a day (BID) | INTRAVENOUS | Status: DC
Start: 2013-10-07 — End: 2013-10-18
  Administered 2013-10-08 – 2013-10-18 (×22): 2 g via INTRAVENOUS
  Filled 2013-10-07 (×24): qty 2000

## 2013-10-07 MED ORDER — CHLORPROMAZINE HCL 25 MG PO TABS
25.0000 mg | ORAL_TABLET | Freq: Three times a day (TID) | ORAL | Status: DC | PRN
Start: 2013-10-07 — End: 2013-10-18
  Filled 2013-10-07 (×3): qty 1

## 2013-10-07 MED ORDER — INSULIN ASPART 100 UNIT/ML SC SOLN
1.0000 [IU] | SUBCUTANEOUS | Status: DC | PRN
Start: 2013-10-07 — End: 2014-07-08

## 2013-10-07 MED ORDER — GLUCAGON HCL (RDNA) 1 MG IJ SOLR
1.0000 mg | INTRAMUSCULAR | Status: DC | PRN
Start: 2013-10-07 — End: 2013-10-18

## 2013-10-07 MED ORDER — SENNA 8.6 MG PO TABS
17.2000 mg | ORAL_TABLET | Freq: Every evening | ORAL | Status: DC
Start: 2013-10-07 — End: 2013-10-18
  Administered 2013-10-08 – 2013-10-17 (×9): 17.2 mg via ORAL
  Filled 2013-10-07 (×12): qty 2

## 2013-10-07 MED ORDER — FAMOTIDINE 20 MG PO TABS
20.0000 mg | ORAL_TABLET | Freq: Two times a day (BID) | ORAL | Status: DC
Start: 2013-10-07 — End: 2014-07-08

## 2013-10-07 MED ORDER — INSULIN GLARGINE 100 UNIT/ML SC SOLN
35.0000 [IU] | Freq: Every morning | SUBCUTANEOUS | Status: DC
Start: 2013-10-07 — End: 2013-10-07
  Administered 2013-10-07: 35 [IU] via SUBCUTANEOUS

## 2013-10-07 MED ORDER — ONDANSETRON 4 MG PO TBDP
4.0000 mg | ORAL_TABLET | Freq: Three times a day (TID) | ORAL | Status: DC | PRN
Start: 2013-10-07 — End: 2013-10-18

## 2013-10-07 MED ORDER — ENOXAPARIN SODIUM 40 MG/0.4ML SC SOLN
40.0000 mg | Freq: Every day | SUBCUTANEOUS | Status: DC
Start: 2013-10-08 — End: 2013-10-18
  Administered 2013-10-08 – 2013-10-18 (×11): 40 mg via SUBCUTANEOUS
  Filled 2013-10-07 (×11): qty 0.4

## 2013-10-07 MED ORDER — DEXTROSE 50 % IV SOLN
25.0000 mL | INTRAVENOUS | Status: DC | PRN
Start: 2013-10-07 — End: 2013-10-07

## 2013-10-07 MED ORDER — CEFTRIAXONE SODIUM 2 G IJ SOLR
2.0000 g | Freq: Two times a day (BID) | INTRAMUSCULAR | Status: DC
Start: 2013-10-07 — End: 2014-07-08

## 2013-10-07 MED ORDER — ZOLPIDEM TARTRATE 5 MG PO TABS
2.5000 mg | ORAL_TABLET | Freq: Every evening | ORAL | Status: DC | PRN
Start: 2013-10-07 — End: 2013-10-18

## 2013-10-07 MED ORDER — FAMOTIDINE 20 MG PO TABS
20.0000 mg | ORAL_TABLET | Freq: Two times a day (BID) | ORAL | Status: DC
Start: 2013-10-07 — End: 2013-10-18
  Administered 2013-10-08 – 2013-10-18 (×22): 20 mg via ORAL
  Filled 2013-10-07 (×22): qty 1

## 2013-10-07 MED ORDER — GLUCAGON HCL (RDNA) 1 MG IJ SOLR
1.0000 mg | INTRAMUSCULAR | Status: DC | PRN
Start: 2013-10-07 — End: 2013-10-07

## 2013-10-07 MED ORDER — INSULIN GLARGINE 100 UNIT/ML SC SOLN
35.0000 [IU] | Freq: Every morning | SUBCUTANEOUS | Status: DC
Start: 2013-10-07 — End: 2013-10-17

## 2013-10-07 MED ORDER — BUTALBITAL-APAP-CAFFEINE 50-325-40 MG PO TABS
2.0000 | ORAL_TABLET | Freq: Four times a day (QID) | ORAL | Status: DC | PRN
Start: 2013-10-07 — End: 2013-10-18
  Administered 2013-10-08 – 2013-10-12 (×6): 2 via ORAL
  Filled 2013-10-07 (×2): qty 2
  Filled 2013-10-07 (×2): qty 1
  Filled 2013-10-07 (×3): qty 2

## 2013-10-07 MED ORDER — SIMVASTATIN 40 MG PO TABS
40.0000 mg | ORAL_TABLET | Freq: Every day | ORAL | Status: DC
Start: 2013-10-07 — End: 2013-10-17

## 2013-10-07 MED ORDER — SALINE SPRAY 0.65 % NA SOLN
2.0000 | NASAL | Status: DC | PRN
Start: 2013-10-07 — End: 2014-07-08

## 2013-10-07 MED ORDER — ALUM & MAG HYDROXIDE-SIMETH 200-200-20 MG/5ML PO SUSP
30.0000 mL | Freq: Four times a day (QID) | ORAL | Status: DC | PRN
Start: 2013-10-07 — End: 2013-10-18

## 2013-10-07 MED ORDER — ALTEPLASE 2 MG IJ SOLR
2.0000 mg | Freq: Once | INTRAMUSCULAR | Status: AC
Start: 2013-10-07 — End: 2013-10-07
  Administered 2013-10-07: 2 mg
  Filled 2013-10-07: qty 2

## 2013-10-07 MED ORDER — SENNOSIDES-DOCUSATE SODIUM 8.6-50 MG PO TABS
2.0000 | ORAL_TABLET | Freq: Every evening | ORAL | Status: DC
Start: 2013-10-07 — End: 2014-07-08

## 2013-10-07 MED ORDER — ACETAMINOPHEN 325 MG PO TABS
650.0000 mg | ORAL_TABLET | Freq: Four times a day (QID) | ORAL | Status: DC | PRN
Start: 2013-10-07 — End: 2013-10-18
  Administered 2013-10-09 – 2013-10-17 (×10): 650 mg via ORAL
  Filled 2013-10-07 (×10): qty 2

## 2013-10-07 MED ORDER — ENOXAPARIN SODIUM 40 MG/0.4ML SC SOLN
40.0000 mg | Freq: Every day | SUBCUTANEOUS | Status: DC
Start: 2013-10-07 — End: 2013-10-17

## 2013-10-07 MED ORDER — OXYCODONE HCL 5 MG PO TABS
5.0000 mg | ORAL_TABLET | ORAL | Status: DC | PRN
Start: 2013-10-07 — End: 2013-10-08

## 2013-10-07 NOTE — Progress Notes (Signed)
[facility] HOSPITALIST PROGRESS NOTE    Date Time: 10/07/2013 8:36 AM  Patient Name: [name] Brown  Attending Physician: Ignacia Felling, MD      Assessment:     Patient Active Problem List   Diagnosis   . Brain abscess   history of sinusitis  DM  OSA  HLD    Plan:   R frontal brain abscess, MSSA s/p craniotomy, removal of SDH. Will need ENT f/u. Will continue to taper steroids. Continue to taper salt tabs    Headache- started oxycontin, prn fioricet. Prn pain med use has decreased. Will need oxycontin taper    Uncontrolled DM- states he was on 2 po meds at home, continue lantus. FS still high after resuming PO meds and unable to take off insulin. Check A1C, increase lantus to 35units, d/c glipizide, continue Metformin    OSA-  CPAP at night ordered    ppx- Lovenox    Discussed with pt and RN.    Anticipated discharge disposition and date: today to acute rehab. TCM appt made      CC: brain abscess    Interval History/24 hour events: flat affect. Wife at bedside. HA seems better- not needing as much prn meds, states that his DM didn't seem well controlled- was living in [place] prior to this so doesn't have doctors here      Physical Exam:     Filed Vitals:    10/07/13 0810   BP: 127/81   Pulse: 58   Temp: 96.3 F (35.7 C)   Resp: 17   SpO2: 95%     Intake and Output Summary (Last 24 hours) at Date Time  No intake or output data in the 24 hours ending 10/07/13 0836    General: awake, alert, ; no acute distress.  HEENT: perrla, eomi, sclera anicteric  oropharynx clear without lesions, mucous membranes moist  Neck: supple  Cardiovascular: regular rate and rhythm, no murmurs, rubs or gallops  Lungs: clear to auscultation bilaterally, without wheezing, rhonchi, or rales  Abdomen: soft, non-tender, non-distended; no palpable masses, no hepatosplenomegaly, normoactive bowel sounds, no rebound or guarding  Extremities: no clubbing, cyanosis, or edema  Neuro: cranial nerves grossly intact, speech fluent, normal tone, strength 5/5 in  upper and lower extremities, sensation intact,   Skin: staples c/d/i    Meds:     Current Facility-Administered Medications   Medication Dose Route Frequency   . cefTRIAXone  2 g Intravenous Q12H SCH   . dexamethasone  2 mg Oral Q12H   . enoxaparin  40 mg Subcutaneous Daily   . famotidine  20 mg Oral Q12H SCH   . glipiZIDE  2.5 mg Oral BID Meals   . insulin glargine  30 Units Subcutaneous QAM   . metFORMIN  1,000 mg Oral BID Meals   . oxyCODONE  10 mg Oral Q12H SCH   . senna-docusate  2 tablet Oral QHS   . simvastatin  40 mg per OG tube Daily with dinner   . sodium chloride  1 g Oral TID MEALS   . [DISCONTINUED] dexamethasone  4 mg Oral Q12H   . [DISCONTINUED] insulin glargine  40 Units Subcutaneous QAM   . [DISCONTINUED] sodium chloride  2 g Oral TID MEALS       PRN MEDICATIONS:  acetaminophen, butalbital-acetaminophen-caffeine, chlorproMAZINE IVPB, dextrose, dextrose, dextrose, glucagon (rDNA), HYDROmorphone, insulin regular, ondansetron, oxyCODONE, saline      Labs:     Results     Procedure Component Value Units Date/Time  Glucose Whole Blood - POCT [[id number]]  (Abnormal) Collected:10/07/13 0806     POCT - Glucose Whole blood 220 (H) mg/dL ZHYQMVH:84/69/62 9528    Sodium [[id number]] Collected:10/07/13 0517    Specimen Information:Blood Updated:10/07/13 2725     Sodium 138 mEq/L     CBC without differential [[id number]]  (Abnormal) Collected:10/07/13 0517    Specimen Information:Blood / Blood Updated:10/07/13 0557     WBC 14.00 (H)      RBC 4.53 (L)      Hgb 13.4 g/dL      Hematocrit 42.5 (L) %      MCV 89.6 fL      MCH 29.6 pg      MCHC 33.0 g/dL      RDW 13 %      Platelets 233      MPV 11.0 fL      Nucleated RBC 0     Glucose Whole Blood - POCT [[id number]]  (Abnormal) Collected:10/06/13 2133     POCT - Glucose Whole blood 313 (H) mg/dL PPIRJJO:84/16/60 6301    Glucose Whole Blood - POCT [[id number]]  (Abnormal) Collected:10/06/13 1704     POCT - Glucose Whole blood 261 (H) mg/dL TDDUKGU:54/27/06 2376     Culture Blood, Aerobic [[id number]] Collected:10/01/13 0834    Specimen Information:Blood / Blood, Venipuncture Updated:10/06/13 1615    Narrative:    ORDER#: [id number]                                    ORDERED BY: Royetta Asal  SOURCE: Blood, Venipuncture blvp                     COLLECTED:  10/01/13 08:34  ANTIBIOTICS AT COLL.:                                RECEIVED :  10/01/13 13:24  Culture Blood                              FINAL       10/06/13 16:15  10/06/13   No growth after 5 days of incubation.      Culture Blood, Aerobic [[id number]] Collected:10/01/13 0834    Specimen Information:Blood / Blood, Venipuncture Updated:10/06/13 1615    Narrative:    ORDER#: [id number]                                    ORDERED BY: Royetta Asal  SOURCE: Blood, Venipuncture blvp                     COLLECTED:  10/01/13 08:34  ANTIBIOTICS AT COLL.:                                RECEIVED :  10/01/13 13:24  Culture Blood                              FINAL       10/06/13 16:15  10/06/13   No growth after 5 days of incubation.      Sodium [[id number]] Collected:10/06/13 1118    Specimen  Information:Blood Updated:10/06/13 1148     Sodium 136 mEq/L     Glucose Whole Blood - POCT [[id number]]  (Abnormal) Collected:10/06/13 1104     POCT - Glucose Whole blood 234 (H) mg/dL WUJWJXB:14/78/29 5621    Anaerobic culture [[id number]] Collected:09/30/13 2230    Specimen Information:Other / Tissue Updated:10/06/13 1112    Narrative:    ORDER#: [id number]                                    ORDERED BY: LIM, JAE  SOURCE: Tissue right frontal brain abscess wal       COLLECTED:  09/30/13 22:30  ANTIBIOTICS AT COLL.:                                RECEIVED :  10/01/13 00:42  Culture Anaerobic                          PRELIM      10/06/13 11:12   +  10/03/13   No growth to date, final report to follow  10/06/13   Very light growth of Anaerobic Gram negative rod               Refer to culture # [id number]        Anaerobic culture [[id number]]  Collected:09/30/13 2230    Specimen Information:Other / Abscess Updated:10/06/13 1052    Narrative:    ORDER#: [id number]                                    ORDERED BY: LIM, JAE  SOURCE: Abscess brain                                COLLECTED:  09/30/13 22:30  ANTIBIOTICS AT COLL.:                                RECEIVED :  10/01/13 00:46  Culture Anaerobic                          PRELIM      10/06/13 10:51   +  10/03/13   No growth to date, final report to follow  10/06/13   Light growth of Anaerobic Gram negative rod               Identification to follow                Rads:     Imaging personally reviewed, including:   Radiology Results (24 Hour)     ** No Results found for the last 24 hours. **            All pertinent labs, medications, radiology studies, vitals reviewed.          Signed by: Ignacia Felling, MD  [phone]

## 2013-10-07 NOTE — Progress Notes (Signed)
NEUROSURGERY PROGRESS NOTE    Date Time: 10/07/2013 1:12 PM  Patient Name: [name] Brown  Consulting Attending Physician: Dr. Vivia Budge  Covered By: Neurosurgery [facility], [id number]    Interim History:   POD 7  Pt c/o headache, improved compared to yesterday. No new neurological complaints.    Medications:     Current Facility-Administered Medications   Medication Dose Route Frequency   . alteplase  2 mg Intracatheter Once   . cefTRIAXone  2 g Intravenous Q12H SCH   . enoxaparin  40 mg Subcutaneous Daily   . famotidine  20 mg Oral Q12H SCH   . insulin glargine  35 Units Subcutaneous QAM   . metFORMIN  1,000 mg Oral BID Meals   . oxyCODONE  10 mg Oral Q12H SCH   . senna-docusate  2 tablet Oral QHS   . simvastatin  40 mg per OG tube Daily with dinner   . sodium chloride  1 g Oral BID Meals   . [DISCONTINUED] dexamethasone  2 mg Oral Q12H   . [DISCONTINUED] dexamethasone  4 mg Oral Q12H   . [DISCONTINUED] glipiZIDE  2.5 mg Oral BID Meals   . [DISCONTINUED] insulin glargine  30 Units Subcutaneous QAM   . [DISCONTINUED] insulin glargine  40 Units Subcutaneous QAM   . [DISCONTINUED] sodium chloride  1 g Oral TID MEALS   . [DISCONTINUED] sodium chloride  2 g Oral TID MEALS         Physical Exam:     Filed Vitals:    10/07/13 1202   BP: 108/60   Pulse: 70   Temp: 97.3 F (36.3 C)   Resp: 16   SpO2: 98%       Intake and Output Summary (Last 24 hours) at Date Time  No intake or output data in the 24 hours ending 10/07/13 1312    Neuro exam:  Awake, alert.    oriented x3 but response somewhat delayed and flat  Speech fluent, follows commands 100%   PERRL, OU 2mm, TML   Slight left facial droop.   MAE, BUE and BLE 5/5 strength    Incision: staples are c/d/i    Labs:     Lab Results   Component Value Date    WBC 14.00* 10/07/2013    HGB 13.4 10/07/2013    HCT 40.6* 10/07/2013    MCV 89.6 10/07/2013    PLT 233 10/07/2013     Lab Results   Component Value Date    NA 138 10/07/2013    K 4.0 10/06/2013    CL 104 10/06/2013    CO2 27 10/06/2013      Lab Results   Component Value Date    INR 1.0 09/30/2013    PT 12.9 09/30/2013       Rads:     Radiology Results (24 Hour)     ** No Results found for the last 24 hours. **          Assessment:   47 yo male presented with AMS x 1 day found to have a large right frontal lesion measuring 4.7 x 5.9 cm with large amount of air and fluid layering suggestive of abscess with history of acute-on-chronic sinusitis and multiple prior sinus surgeries, cerebral edema with anterior leftward midline shift of 2 cm with lateral ventricular effacement and uncal herniation s/p craniotomy for removal of SDH is now POD #7. Neurologically stable.      Plan:   Continue medical treatment   Mobilize  Taper decadron off  Follow-up with Dr. Duanne Moron office in 1-2 weeks for staple removal       Signed by: Rolinda Roan PA-C  Date/Time: 10/07/2013 1:12 PM

## 2013-10-07 NOTE — PT Progress Note (Signed)
[facility]   Physical Therapy Treatment  Patient:  [name] Brown MRN#:  [id number]  Unit: [facility]  Bed: F-6/.23    Discharge Recommendations:   D/C Recommendations: Acute Rehab   DME Recommendations:  (ongoing assessment)     If Acute Rehab recommended discharge disposition is not available, patient will need hands on assist for all mobility, no equipment, and HHPT.        Assessment:   Pt continues to demonstrate poor dynamic balance and decreased independence.    Assessment: Decreased endurance/activity tolerance;Decreased safety/judgement during functional mobility;Decreased functional mobility;Gait impairment  Progress: Progressing toward goals  Prognosis: Good  Risks/Benefits/POC Discussed with Pt/Family: With patient  Patient left without needs and call bell within reach. RN notified of session outcome.     Treatment Activities: gait training, sitting and standing balance tasks, pt ed    Educated the patient to role of physical therapy, plan of care, goals of therapy and safety with mobility and ADLs, home safety.    Plan:   Treatment/Interventions: Exercise;Gait training;Functional transfer training;Patient/family training;Bed mobility        PT Frequency: 3-4x/wk     Continue plan of care.       Precautions and Contraindications:   Precautions  Other Precautions: falls    Updated Medical Status/Imaging/Labs: none    Activity Orders:  As tolerated    Subjective:   Pain: moderate  Location: HA  Intervention: rest, reposition    Patient's medical condition is appropriate for Physical Therapy intervention at this time.  Patient is agreeable to participation in the therapy session. Nursing clears patient for therapy.    Objective:   Observation of Patient/Vital Signs:  Patient is in bed with PIV, tele in place.  Family at bedside.  Cognition: alert, Ox3, poor insight noted    Functional Mobility:  Sit to Stand: Min assist  Stand to Sit: Stand by assistance       Ambulation:  Ambulation: minimal  assistance  Ambulation Distance (Feet): 10 Feet  Pattern: shuffle;R foot decreased clearance;L foot decreased clearance     Neuro Re-Ed  Standing Balance: minimal assistance       Patient Participation: fair  Patient Endurance: good    Patient left with call bell within reach, all needs met and all questions answered, RN notified of session outcome and patient response.     Goals:  Goals  Goal Formulation: With patient/family  Time for Goal Acheivement: 3 visits  Goals: Select goal  Pt Will Go Supine To Sit: Independently  Pt Will Transfer Bed/Chair: Independently  Pt Will Ambulate: > 200 feet;Independently  Pt Will Go Up / Down Stairs: 3-5 stairs;With stand by assist    Time of Treatment  PT Received On: 10/07/13  Start Time: 0940  Stop Time: 1010  Time Calculation (min): 30 min  Treatment # 1 out of 3 visits    Kaylyn Layer, PT, DPT [phone] 10/07/2013 10:55 AM

## 2013-10-07 NOTE — Progress Notes (Signed)
Updated problem List    Acute Problem List:   Frontal brain abscess with midline shift   --drainage of brain abscess 09/30/13 ; Culture +MSSA   -------------------------------------------   Hx multiple sinus surgeries craniotomy in 2003; 2008 for sinusitis with purulence   Allergy PCN   --------------------------------------------   Chronic:   DM   HLD   Sleep apnea   Allergies:      Allergies    Allergen  Reactions    .  Morphine     .  Penicillins       Review of Systems:    General ROS: negative for - chills, fevers, night sweats, weight loss   HEENT: negative for - blurry vision, sore throat, thrush, admits to headache   Respiratory ROS: negative for cough, SOB   Cardiovascular ROS: negative for - chest pain, palpitations   Gastrointestinal ROS: negative for - abdominal pain, nausea, vomiting, diarrhea   Genito-Urinary ROS: negative for - dysuria, urinary frequency/urgency   Musculoskeletal ROS: negative for - joint pain, joint stiffness or muscle pain   Dermatological ROS: negative for - rash and skin lesion changes   Neurological ROS: slow movements, headache   Hematological ROS: negative for - bruising,   Assessment:      Flat affect     Plan:    IV Ceftriaxone at CNS dosage for several weeks which can be continued as an outpatient post discharge   Subjective:    Headache   Antibiotics:    Day#8 total  (Day #5  Ceftriaxone 2 Grams q 12 h IV)  All other medications reviewed   Lines:    R PICC ; 10/02/13    Exam/Data:  Afebrile, WBC~14K, Cr=0.7  No new culture data  No new imaging data  Notes seen re: possible transfer to [facility] rehab  Steroid taper continues    Continuing antibiotics as ordered    Long D/W Patient and Wife at the bedside with many questions answered at their request

## 2013-10-07 NOTE — Progress Notes (Signed)
Patient discharged to [facility] by wheelchair transport at [phone]. All belongings taken by Sister with discharge. Report called to accepting facility. Patient stated dizziness and HA just before getting in wheelchair. Orthostatic VS were negative, and Oxycodone 10mg  given. Patient stated feeling well enough to leave and was escorted to lobby in wheelchair by Staff.

## 2013-10-07 NOTE — Discharge Instructions (Signed)
You will be on antibiotics for weeks. Follow up with the Infectious Disease doctor in 1 week to monitor progress and determine when you can stop the antibiotics  Remove staples on around 10/15/13 or follow up with Dr. Jaynie Collins in the office    [facility]    An appointment has been scheduled for you at the  [facility] for:      Monday, January 5th @ 9:30AM    At Location    [address]  [address]  [place], [place] [address]  [phone]      Please bring the following with you to your appointment:    Medications in their original bottles    Glucometer/blood sugar log (if diabetic)    Weight log (if heart failure)    Proof of income (to enroll in medication assistance programs-last tax return or last 2 months of pay stubs)    If this is your initial appointment, please come 15 minutes prior to scheduled appointment time to complete initial appointment forms.

## 2013-10-07 NOTE — Progress Notes (Addendum)
SW spoke with pt wife and she stated that she would like to d/c to [facility]. Per conversation with Damian Leavell of [facility] [phone], pt has a bed. Sw notified Okey Regal of [place] to start auth [phone]. Clinicals faxed.    Room# 532 B2   Report# [phone]    Bonna Gains, LGSW  Social Worker, Stroke Unit  [phone]

## 2013-10-07 NOTE — Rehab Liaison Note ([facility]) (Signed)
[name]  MRN: [id number]  Account: [id number]  Session Start: 10/07/2013 12:00:00 AM  Session Stop: 10/07/2013 12:00:00 AM    Clinical Liaison  Inpatient Rehabilitation Pre Admission Screen    Medical Diagnosis:  Intracerebral abscess, S/P  Craniotomy for evacuation of  Right Frontal Intracerebral Abscess  Rehab Diagnosis: Gait Abnormality  Probable Impairment Group Code: 02.9 Other brain  Demographics:   Age: 47Y   Gender: Unknown   Name, phone and relationship of contact person:  Contact Name: [name] Brown  Phone:   [phone]  Relationship:   Spouse.  Contact Name: [name] Brown  Phone:   [phone]  Relationship:   Child   Guardian/Power of Attorney:   It is unknown whether the patient has a Advertising account planner.   [name] Medical Record Number: [id number]  Past Medical History: Past Medical History:  No past medical history on file      PAST SURGICAL HISTORY:  As above, plus multiple sinus surgeries and question of resection of  frontal lobe during one of those surgeries.    Prior Level of Functioning:  Mobility:   Independent with functional mobility  Activities of Daily Living:  Independent with ADL'S  Cognition:  Intact  Communication:  Intact  Swallowing:  Intact    Social History:  Marital Status: married  Children: 1 dtr 1son          Reside:  both are out of the area.  Employment Status:  Recreational Activities/Hobbies:  100 % independent with functional mobility  Pre-hospital Living Environment: The pt and his wife have just relocated to the  area 2 months ago. The are staying in a hotel with no stairs .The discharge plan  is for the pt to return to the hotel. The pt's 22 yr old son will come to assist  after rehab. The pt's wife do work during the day, but her job is very close to  the hotel, and she will be able to stop by if needed.  Language:  English    The following information was gathered for consideration and maintenance in the  medical record to substantiate  medical necessity for an IRF level of care.    This assessment is being completed by Gerrit Halls , CRRN . Patient is currently  at [facility] room 6-23, unit # [id number], cm Versie Starks [phone].    The patient is being referred and recommended by their physician, Dr. Fabio Bering,  [place], to be assessed both medically and functionally in regard to their  premorbid functional capacity to determine whether they can benefit from a  rehabilitation level of care offered by our facility.    The following is information regarding the medical complexity and clinical risk  factors that needs to be considered for the appropriate management of the  patient's care and recovery.  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 CX-MSSA  # Vasogenic edema  # Brain herniation  # Chronic sinusitis  # DM  # HLD  # OSA  History of Present Illness:  [name] Brown is a 47 y.o. unknown handed male who  presents to the hospital on 09/30/2013 with a large right frontal abscess with  history of multiple sinus surgeries and recent sinus infection per report.  Patient presented to [facility] today with complaints of AMS x 1 day. HCT  obtained shows a large right frontal abscess with ventricular effacement and 2  cm anterior leftward midline shift. The  pt was transfered to [facility] on 09/30/13. He  underwent Reexploration of bicoronal exposure for craniotomy for evacuation of  right frontal intracerebral abscess by Dr. Madaline Savage on 09/30/13. As of  10/06/13 the plan of care per the medicine team:  Plan:    R frontal brain abscess, MSSA s/p craniotomy, removal of SDH. Will need ENT f/u.  Will continue to taper steroids. Na tablet taper    Headache- start oxycontin, prn fioricet    Uncontrolled DM- states he was on 2 po meds at home, continue lantus. Will start  Metformin and glipizide    OSA- will order home CPAP    ppx- Lovenox    Anticipated discharge disposition and date: 1 day to acute rehab. Will need  TCM  appointment  Per the medicine team providing the pt remain hemodynamically stable, the pt  will be ready for discharge in the am.   Date of Onset: 09/30/13   Date Admitted to Acute:  09/30/13  Current Precautions:   Risk for falls.   Risk of aspiration/swallowing.  Food Allergies  No known food allergies.  Medication Allergies   Morphine: Unknown reaction.   Penicillin: Unknown reaction.  Other Allergies Not applicable.    Present Systems Summary:  Maximum Temperature (last 48 hrs):  97 degrees.  Pulse:  68 beats per minute.  Respirations:  16 breaths per minute.  Blood Pressure:   121/68 mmHg.  Pulse Ox: 96% ra  Height:   5 ft  Weight:   274 lbs  Hearing: No current issues  Vision: No current issues.  DIET: Type: Diabetic.  Current Diet Texture: Dysphagia Mechanically Altered  Current Liquid Consistency: Thin liquids.  Bladder: Patient is continent of bladder.  Bowel: Patient does not have an ostomy.  Date of Last Bowel Movement:  10/04/13 Patient is continent of bowel.  Integumentary:   Surgical Site. head  Cardiopulmonary:   Room Air.  Dialysis: Patient currently is not receiving dialysis.  Current Medication(s): 10/06/13     cefTRIAXone   2 g  Intravenous  Q12H SCH    dexamethasone   4 mg  Oral  Q12H    enoxaparin   40 mg  Subcutaneous  Daily    famotidine   20 mg  Oral  Q12H SCH    insulin glargine   40 Units  Subcutaneous  QAM    senna-docusate   2 tablet  Oral  QHS    simvastatin   40 mg  per OG tube  Daily with dinner    sodium chloride   2 g  Oral  TID MEALS  Current Alcohol Use:  Patient does not consume alcohol  Current Tobacco Use:  No, patient does not use tobacco  Current Drug Use: No, patient does not use recreational drugs.  Pain: Patient currently without complaints of pain.    Additional medical documentation contributing to the expected care of this  patient may be noted in the following areas:  Laboratory-Chemistry/Hematology:   10/06/2013      Glucose 253 (H) 70 - 100 mg/dL    BUN 66.4 (H) 9.0  - 21.0 mg/dL    Creatinine 0.7 0.7 - 1.3 mg/dL    CALCIUM 8.0 (L) 8.5 - 10.5 mg/dL    Sodium 403 474 - 259 mEq/L    Potassium 4.0 3.5 - 5.1 mEq/L    Chloride 104 98 - 107 mEq/L    CO2 27 22 - 29 mEq/L    10/06/2013  WBC 13.30 (H) 3.50 - 10.80    RBC 4.44 (L) 4.70 - 6.00    Hgb 13.2 13.0 - 17.0 g/dL    Hematocrit 16.1 (L) 42.0 - 52.0 %  Cultures:   Not applicable.  Radiology:   Chest X-Ray: XR Chest AP Portable (QAM  times 2 days) [[id number]] (Order  [id number])   Status: Final result  12/614      Study Result    CLINICAL INFORMATION: Postop craniotomy.        IMPRESSION:   Low lung volumes with essentially stable bilateral  atelectatic type opacities.   CT: CT Head WO Contrast [[id number]] (Order [id number])   Status: Final result  10/03/13    Study Result    INDICATION:  Intracerebral abscess.  History of sinus surgery.      IMPRESSION:    1.   Postsurgical changes from recent evacuation of right frontal  intracerebral abscess.   Evaluation is limited secondary to noncontrast  CT technique; however a residual collection containing air, fluid and  hemorrhage may be present within the right frontal lobe measuring  approximately 4.4 x 3.0 cm.  IVs:  IV Access: No IV access.    Is patient presently participating in rehabilitation? Yes  Adjustment to Present Illness: Patient is coping adequately.   Patient is accepting limitations adequately.   Patient's expectations are realistic.   Patient is motivated.  Activity Tolerance:  Fair. Fair. Fair.  SPECIAL NEEDS: None.    Current Functional Status  Weight-bearing Status:   No Restrictions  Mobility: 10/06/13    Cognition  A and O x 3.  Follows commands with delay.  Intermittent agitation, impulsivity.   Pt states issues with word finding.    Functional Mobility  Rolling: independent  Supine to Sit: CGA  Scooting: CGA  Sit to Stand: close CGA to Min A  Stand to Sit: CGA to Min A  Transfers: Min A    Ambulation  Level of Assistance required: Min A  Ambulation Distance: 71' x 3  with multiple standing rest breaks  Pattern: staggered gait both L and R, and especially with head turns.  Device Used: none  Weightbearing Status: no restrictions    Balance  Static Sitting: good  Dynamic Sitting: good  Static Standing: fair  Dynamic Standing: fair-    Patient Participation: good  Patient Endurance: good    Activities of Daily Living: 10/07/13    Cognitive Status and Neuro Exam:  Cognition  Arousal/Alertness: Delayed responses to stimuli  Attention Span: Attends to task with redirection  Orientation Level: Oriented X4  Following Commands: maximal verbal instruction  Safety Awareness: moderate verbal instruction  Insights: Decreased awareness of deficits (unable to state deficits)  Problem Solving: Assistance required to generate solutions;Assistance required  to implement solutions  Decreased volition      Neuro Status  Behavior: aggitated;distractable;impulsive;flat affect  Motor Planning: decreased processing speed    Activities of Daily Living  Self-care and Home Management  Eating: stand by assistance;Setup  Grooming: stand by assistance  Bathing: stand by assistance  UE Dressing: stand by assistance  LE Dressing: minimal assistance  Functional Transfers: minimal assistance  Additional Comments:  (cues required to attend, initiate, complete task, )    Functional Mobility:  Mobility and Transfers  Sit to Stand: Min assist  Bed to Chair: Min assist  Functional Mobility/Ambulation: Min assist    Balance  Balance  Static Sitting Balance: with supervision  Dyanamic Sitting Balance: with supervision  Static Standing Balance:  (  CGa)  Dynamic Standing Balance: with minimal assist    Participation and Activity Tolerance  Participation and Endurance  Participation Effort: good  Endurance: Tolerates 10 - 20 min exercise with multiple rests    Cognition: 10/06/13 Alert/Oriented. A and O x 3.  Follows commands with delay.  Intermittent agitation, impulsivity.  Pt states issues with word  finding.  Impulsivity.    Communication: 10/06/13 None noted at this time.    Swallowing:  10/06/13   Pt presents with mild oral and suspected pharyngeal dysphagia      Other Impairments: Gait abnormality    Patient is able to understand and make Brown decisions  No, discussed  information with his wife [name] Brown Source: Level of care will be discussed with the following payer sources  if/when applicable.  Primary: BC FEP  Secondary: Not Applicable    Information and Case Discussion:   Rehabilitation risks/benefits were reviewed.  Patient/family/caregiver agrees to/accepts rehabilitation risks/benefits.   Rehab literature/brochure was provided to: 10/06/13 to the pt's wife. .  Case  will be discussed with Physician/Medical Director.    IRF Admission Approval/Non-Approval  Appropriateness for admission to the Inpatient Rehabilitation Facility:  The  patient's condition is sufficiently stable to allow active participation in an  intensive interdisciplinary inpatient rehabilitation program. 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 CX-MSSA  # Vasogenic edema  # Brain herniation  # Chronic sinusitis  # DM  # HLD  # OSA  Recommended services:  The recommended interdisciplinary team will be comprised of the following  services:   Medical Supervision.  24 Hour Rehabilitation Nursing.  Physical Therapy.  Occupational Therapy.  Speech Therapy.  Psychology.  Therapeutic  Recreation.  Registered Dietitian.  Patient's expected intensity and frequency of participation in the  interdisciplinary rehabilitation program: is 3 hours of therapy 5 days/week.  Prognosis and level of expected improvement with inpatient rehabilitation stay  is:   prognosis: good. By discaharge the pt will require mi/s with functional  mobility and ADL'S, will swallow the least restrictive diet safely, will  demonstrate safety measures, caregiver training to be completed.  Estimated date of admission to acute inpatient:   10/07/13  Estimated length of inpatient rehabilitation stay in order to achieve rehab  medical/functional goals:   7 to 10 days  Anticipated destination post discharge from inpatient rehabilitation is:   community discharge with assistance.    Anticipate patient will need the following services post discharge from  inpatient rehabilitation: Outpatient therapy.      Physician Approval Status of Admission: Admission Approval:  The patient's  condition is sufficiently stable to allow active participation in an intensive  interdisciplinary inpatient rehabilitation program. 47 yo M with acquired BI due  to MSSA brain abcess.  Will need intensive inpatient PT, OT, and SLP under close  medical supervision.  Requires medical management for infection and poorly  controlled DM. LTS  12/12  14:33    This assessment denotes that on admission to the inpatient rehabilitation  facility, our physician will provide documentation that demonstrates clinical  rehabilitation complications for which the patient is at risk and a specific  plan to avoid those risks. Further, the medical conditions present create  possible adverse conditions that predictably can be controlled through an  intensive rehabilitation plan of care to be outlined at admission.    Annotated by: Gerrit Halls  Annotated: 10/07/2013 2:26:00 PM  BC FEP: 7 days auth by Okey Regal [phone], cm Misty Stanley [phone], fax  [phone]    Signed by: Gerrit Halls, CRRN 10/07/2013 2:10:00 PM    Physician CoSigned By: Tama Gander 10/07/2013 14:33:18

## 2013-10-07 NOTE — Discharge Summary (Signed)
DISCHARGE SUMMARY    Date Time: 10/07/2013 10:25 AM  Patient Name: [facility]  Attending Physician: Ignacia Felling, MD    Date of Admission:   09/30/2013    Date of Discharge:   10/07/13    Reason for Admission:   Abscess of brain [324.0]  Brain abscess [324.0] (Brain abscess)    Problems:   Lists the present on admission hospital problems  Present on Admission:   . Brain abscess    Hospital Problems:  Principal Problem:   *Brain abscess  Active Problems:   DM (diabetes mellitus)   Encephalopathy      Problem Lists:  Patient Active Problem List   Diagnosis   . Brain abscess   . DM (diabetes mellitus)   . Encephalopathy           Discharge Dx:   1. R frontal brain abscess, growing MSSA  2. S/p Craniotomy  3. Headache  4. Diabetes, uncontrolled  5. OSA  6. Hyperlipidemia    Consultations:   Dr. Vivia Budge, Neurosurgery  Dr. Almira Coaster, Infectious Diseases  Dr. Merri Ray, ENT    Procedures performed:   09/30/13 Procedure(s):   CRANIOTOMY, REMOVAL HEMATOMA, SUBDURAL      Xr Abdomen Ap    09/30/2013   Enteric tube in the stomach.  Genelle Bal, MD  09/30/2013 6:54 PM     Ct Head Wo Contrast    10/03/2013   1.   Postsurgical changes from recent evacuation of right frontal intracerebral abscess.   Evaluation is limited secondary to noncontrast CT technique; however a residual collection containing air, fluid and hemorrhage may be present within the right frontal lobe measuring approximately 4.4 x 3.0 cm.    [name], MD  10/03/2013 6:52 AM     Xr Chest Ap Portable (qam  Times 2 Days)    10/01/2013   Low lung volumes with essentially stable bilateral atelectatic type opacities.  Annabell Sabal, MD  10/01/2013 8:02 AM     Xr Chest Ap Portable    09/30/2013   Right hilar and left basilar atelectasis  Genelle Bal, MD  09/30/2013 6:53 PM       Results     Procedure Component Value Units Date/Time    Hemoglobin A1C [[id number]] Collected:10/07/13 0517    Specimen Information:Blood Updated:10/07/13 1011    Glucose Whole  Blood - POCT [[id number]]  (Abnormal) Collected:10/07/13 0806     POCT - Glucose Whole blood 220 (H) mg/dL FAOZHYQ:65/78/46 9629    Sodium [[id number]] Collected:10/07/13 0517    Specimen Information:Blood Updated:10/07/13 0102     Sodium 138 mEq/L     CBC without differential [[id number]]  (Abnormal) Collected:10/07/13 0517    Specimen Information:Blood / Blood Updated:10/07/13 0557     WBC 14.00 (H)      RBC 4.53 (L)      Hgb 13.4 g/dL      Hematocrit 34.7 (L) %      MCV 89.6 fL      MCH 29.6 pg      MCHC 33.0 g/dL      RDW 13 %      Platelets 233      MPV 11.0 fL      Nucleated RBC 0     Glucose Whole Blood - POCT [[id number]]  (Abnormal) Collected:10/06/13 2133     POCT - Glucose Whole blood 313 (H) mg/dL FIEPPIR:51/88/41 6606    Glucose Whole Blood - POCT [[id number]]  (Abnormal) Collected:10/06/13  1704     POCT - Glucose Whole blood 261 (H) mg/dL ZOXWRUE:45/40/98 1191    Culture Blood, Aerobic [[id number]] Collected:10/01/13 0834    Specimen Information:Blood / Blood, Venipuncture Updated:10/06/13 1615    Narrative:    ORDER#: [id number]                                    ORDERED BY: Royetta Asal  SOURCE: Blood, Venipuncture blvp                     COLLECTED:  10/01/13 08:34  ANTIBIOTICS AT COLL.:                                RECEIVED :  10/01/13 13:24  Culture Blood                              FINAL       10/06/13 16:15  10/06/13   No growth after 5 days of incubation.      Culture Blood, Aerobic [[id number]] Collected:10/01/13 0834    Specimen Information:Blood / Blood, Venipuncture Updated:10/06/13 1615    Narrative:    ORDER#: [id number]                                    ORDERED BY: Royetta Asal  SOURCE: Blood, Venipuncture blvp                     COLLECTED:  10/01/13 08:34  ANTIBIOTICS AT COLL.:                                RECEIVED :  10/01/13 13:24  Culture Blood                              FINAL       10/06/13 16:15  10/06/13   No growth after 5 days of incubation.      Sodium [[id number]]  Collected:10/06/13 1118    Specimen Information:Blood Updated:10/06/13 1148     Sodium 136 mEq/L     Glucose Whole Blood - POCT [[id number]]  (Abnormal) Collected:10/06/13 1104     POCT - Glucose Whole blood 234 (H) mg/dL OVFIEPP:29/51/88 4166    Anaerobic culture [[id number]] Collected:09/30/13 2230    Specimen Information:Other / Tissue Updated:10/06/13 1112    Narrative:    ORDER#: [id number]                                    ORDERED BY: LIM, JAE  SOURCE: Tissue right frontal brain abscess wal       COLLECTED:  09/30/13 22:30  ANTIBIOTICS AT COLL.:                                RECEIVED :  10/01/13 00:42  Culture Anaerobic                          PRELIM  10/06/13 11:12   +  10/03/13   No growth to date, final report to follow  10/06/13   Very light growth of Anaerobic Gram negative rod               Refer to culture # [id number]        Anaerobic culture [[id number]] Collected:09/30/13 2230    Specimen Information:Other / Abscess Updated:10/06/13 1052    Narrative:    ORDER#: [id number]                                    ORDERED BY: LIM, JAE  SOURCE: Abscess brain                                COLLECTED:  09/30/13 22:30  ANTIBIOTICS AT COLL.:                                RECEIVED :  10/01/13 00:46  Culture Anaerobic                          PRELIM      10/06/13 10:51   +  10/03/13   No growth to date, final report to follow  10/06/13   Light growth of Anaerobic Gram negative rod               Identification to follow        Glucose Whole Blood - POCT [[id number]]  (Abnormal) Collected:10/06/13 0744     POCT - Glucose Whole blood 206 (H) mg/dL GEXBMWU:13/24/40 1027    Basic Metabolic Panel [[id number]]  (Abnormal) Collected:10/06/13 0525    Specimen Information:Blood Updated:10/06/13 0620     Glucose 253 (H) mg/dL      BUN 25.3 (H) mg/dL      Creatinine 0.7 mg/dL      CALCIUM 8.0 (L) mg/dL      Sodium 664 mEq/L      Potassium 4.0 mEq/L      Chloride 104 mEq/L      CO2 27 mEq/L     GFR [[id number]]  Collected:10/06/13 0525     EGFR >60.0 Updated:10/06/13 0620    CBC without differential [[id number]]  (Abnormal) Collected:10/06/13 0525    Specimen Information:Blood / Blood Updated:10/06/13 0606     WBC 13.30 (H)      RBC 4.44 (L)      Hgb 13.2 g/dL      Hematocrit 32.9 (L) %      MCV 88.7 fL      MCH 29.7 pg      MCHC 33.5 g/dL      RDW 13 %      Platelets 230      MPV 10.6 fL      Nucleated RBC 0     Glucose Whole Blood - POCT [[id number]]  (Abnormal) Collected:10/06/13 0321     POCT - Glucose Whole blood 280 (H) mg/dL YTKZSWF:09/32/35 5732    Sodium [[id number]] Collected:10/05/13 2322    Specimen Information:Blood Updated:10/05/13 2356     Sodium 136 mEq/L     Sodium [[id number]] Collected:10/05/13 1658    Specimen Information:Blood Updated:10/05/13 1715     Sodium 139 mEq/L     Glucose Whole Blood - POCT [[id number]]  (Abnormal) Collected:10/05/13 1618  POCT - Glucose Whole blood 253 (H) mg/dL ZOXWRUE:45/40/98 1191    Glucose Whole Blood - POCT [[id number]]  (Abnormal) Collected:10/05/13 1152     POCT - Glucose Whole blood 210 (H) mg/dL HYQMVHQ:46/96/29 5284    Sodium [[id number]] Collected:10/05/13 1039    Specimen Information:Blood Updated:10/05/13 1104     Sodium 138 mEq/L     Glucose Whole Blood - POCT [[id number]]  (Abnormal) Collected:10/05/13 0737     POCT - Glucose Whole blood 187 (H) mg/dL HKVQQVZ:56/38/75 6433    Basic Metabolic Panel [[id number]]  (Abnormal) Collected:10/05/13 0431    Specimen Information:Blood Updated:10/05/13 0515     Glucose 230 (H) mg/dL      BUN 60.6 mg/dL      Creatinine 0.7 mg/dL      CALCIUM 8.0 (L) mg/dL      Sodium 301 mEq/L      Potassium 4.2 mEq/L      Chloride 107 mEq/L      CO2 25 mEq/L     GFR [[id number]] Collected:10/05/13 0431     EGFR >60.0 Updated:10/05/13 0515    CBC without differential [[id number]]  (Abnormal) Collected:10/05/13 0431    Specimen Information:Blood / Blood Updated:10/05/13 0501     WBC 11.97 (H)      RBC 4.34 (L)      Hgb 12.7 (L) g/dL      Hematocrit  27.0 (L) %      MCV 88.9 fL      MCH 29.3 pg      MCHC 32.9 g/dL      RDW 13 %      Platelets 241      MPV 10.4 fL      Nucleated RBC 0     Glucose Whole Blood - POCT [[id number]]  (Abnormal) Collected:10/04/13 2218     POCT - Glucose Whole blood 244 (H) mg/dL DVVOHYW:73/71/06 2694    i-Stat E3 CartrIDge [[id number]]  (Abnormal) Collected:10/04/13 1907     i-STAT Sodium 143 mEq/L Updated:10/04/13 1923     i-STAT Potassium 3.6 mEq/L      i-STAT Hematocrit 33.0 (L) %      i-STAT Hemoglobin 11.2 (L) g/dL     Glucose Whole Blood - POCT [[id number]]  (Abnormal) Collected:10/04/13 1713     POCT - Glucose Whole blood 250 (H) mg/dL HBZJIRC:78/93/81 0175    i-Stat E3 CartrIDge [[id number]]  (Abnormal) Collected:10/04/13 1304     i-STAT Sodium 143 mEq/L Updated:10/04/13 1344     i-STAT Potassium 3.9 mEq/L      i-STAT Hematocrit 40.0 (L) %      i-STAT Hemoglobin 13.6 g/dL     Glucose Whole Blood - POCT [[id number]]  (Abnormal) Collected:10/04/13 1130     POCT - Glucose Whole blood 220 (H) mg/dL WERXVQM:08/67/61 9509    Glucose Whole Blood - POCT [[id number]]  (Abnormal) Collected:10/04/13 0755     POCT - Glucose Whole blood 186 (H) mg/dL KDXIPJA:25/05/39 7673    Basic Metabolic Panel [[id number]]  (Abnormal) Collected:10/04/13 0400    Specimen Information:Blood Updated:10/04/13 0505     Glucose 230 (H) mg/dL      BUN 09.7 (H) mg/dL      Creatinine 0.7 mg/dL      CALCIUM 8.3 (L) mg/dL      Sodium 353 mEq/L      Potassium 4.1 mEq/L      Chloride 109 (H) mEq/L      CO2 21 (L) mEq/L     GFR [[id number]] Collected:10/04/13 0400  EGFR >60.0 Updated:10/04/13 0505    CBC without differential [[id number]]  (Abnormal) Collected:10/04/13 0400    Specimen Information:Blood / Blood Updated:10/04/13 0451     WBC 17.42 (H)      RBC 4.68 (L)      Hgb 13.6 g/dL      Hematocrit 78.2 (L) %      MCV 89.3 fL      MCH 29.1 pg      MCHC 32.5 g/dL      RDW 13 %      Platelets 300      MPV 10.7 fL      Nucleated RBC 0     i-Stat E3 CartrIDge [[id number]]   (Abnormal) Collected:10/03/13 2322     i-STAT Sodium 143 mEq/L Updated:10/03/13 2328     i-STAT Potassium 3.9 mEq/L      i-STAT Hematocrit 39.0 (L) %      i-STAT Hemoglobin 13.3 g/dL     i-Stat E3 CartrIDge [[id number]]  (Abnormal) Collected:10/03/13 2253     i-STAT Sodium 157 (H) mEq/L Updated:10/03/13 2328     i-STAT Potassium 3.5 mEq/L      i-STAT Hematocrit 37.0 (L) %      i-STAT Hemoglobin 12.6 (L) g/dL     Glucose Whole Blood - POCT [[id number]]  (Abnormal) Collected:10/03/13 2250     POCT - Glucose Whole blood 233 (H) mg/dL WNUUVOZ:36/64/40 3474    Basic Metabolic Panel [[id number]]  (Abnormal) Collected:10/03/13 1647    Specimen Information:Blood Updated:10/03/13 1720     Glucose 277 (H) mg/dL      BUN 64.3 (H) mg/dL      Creatinine 0.7 mg/dL      CALCIUM 8.2 (L) mg/dL      Sodium 329 mEq/L      Potassium 4.2 mEq/L      Chloride 109 (H) mEq/L      CO2 21 (L) mEq/L     GFR [[id number]] Collected:10/03/13 1647     EGFR >60.0 Updated:10/03/13 1720    Glucose Whole Blood - POCT [[id number]]  (Abnormal) Collected:10/03/13 1638     POCT - Glucose Whole blood 243 (H) mg/dL NATFTDD:22/02/54 2706    Wound culture & gram stain [[id number]] Collected:09/30/13 2230    Specimen Information:Wound / Abscess Updated:10/03/13 1640    Narrative:    ORDER#: [id number]                                    ORDERED BY: LIM, JAE  SOURCE: Abscess brain                                COLLECTED:  09/30/13 22:30  ANTIBIOTICS AT COLL.:                                RECEIVED :  10/01/13 00:46  Stain, Gram                                FINAL       10/01/13 01:51  10/01/13   Many WBCs             No Squamous epithelial cells seen             Few Gram negative rods  Culture  Wound                              FINAL       10/03/13 16:39   +  10/03/13   Gram negative rods seen on original Gram stain are non-viable for             aerobic culture.  10/02/13   Moderate growth of Staphylococcus  aureus      _____________________________________________________________________________                                   S. aureus      ANTIBIOTICS                     MIC  INTRP      _____________________________________________________________________________  Clindamycin                    <=0.5   S        Erythromycin                   <=0.5   S        Gentamicin                      <=2    S        Levofloxacin                    <=1    S        Oxacillin                       0.5    S        Penicillin                      >1     R        Rifampin                       <=0.5   S        Tetracycline                   <=0.5   S        Trimethoprim/Sulfamethoxazole <=0.5/9  S        Vancomycin                       1     S        _____________________________________________________________________________            S=SUSCEPTIBLE     I=INTERMEDIATE     R=RESISTANT      N/R=NOT REPORTED: Unable to determine S,I, and R based on            available MIC dilutions and/or CLSI guidelines.  _____________________________________________________________________________      Sedimentation rate (ESR) [[id number]]  (Abnormal) Collected:10/03/13 1521    Specimen Information:Blood Updated:10/03/13 1555     Sed Rate 32 (H)     Sodium [[id number]]  (Abnormal) Collected:10/03/13 1521    Specimen Information:Blood Updated:10/03/13 1549     Sodium 150 (H) mEq/L     Glucose Whole Blood - POCT [[id number]]  (Abnormal) Collected:10/03/13 1217     POCT - Glucose Whole blood  309 (H) mg/dL BJYNWGN:56/21/30 8657    Glucose Whole Blood - POCT [[id number]]  (Abnormal) Collected:10/03/13 0903     POCT - Glucose Whole blood 265 (H) mg/dL WUXLKGM:10/28/70 5366    Respiratory Culture [[id number]] Collected:10/01/13 0834    Specimen Information:Sputum / Sputum, Suctioned Updated:10/03/13 1039    Narrative:    ORDER#: [id number]                                    ORDERED BY: Royetta Asal  SOURCE: Sputum, Suctioned sp                          COLLECTED:  10/01/13 08:34  ANTIBIOTICS AT COLL.:                                RECEIVED :  10/01/13 10:25  Stain, Gram (Respiratory)                  FINAL       10/01/13 16:20  10/01/13   No Epithelial cells             Few WBC's             No organisms seen  Culture Respiratory                        FINAL       10/03/13 10:39   +  10/02/13   Light growth of mixed upper respiratory flora  10/02/13   Very light growth of Staphylococcus aureus               No Further Workup.            Hospital Course:   47 year old male sent from [facility] for brain abscess. The patient has a past medical history of diabetes, multiple episodes of sinus infection and sinus surgery, hyperlipidemia, and obstructive sleep apnea. He has been having sinus infection for the last couple of weeks, and he has been treated as an outpatient with antibiotics and steroids. Today, he presented to the outside hospital with changes in mental status that started one day prior. The patient was complaining of frontal headache, not feeling well, seemed very fatigued. He started becoming increasingly lethargic with decreased responsiveness. There are no reports of any seizure activity as per the chart review. Since in hospital, the patient was intubated for airway protection. A stat head CT shows a large 4 x 5 cm collection into the right frontal lobe with vasogenic edema, mass effect about 2 cm of midline shift. There is prominence of the temporal course suggesting early mild hydrocephalus. Also there is subfalcine herniation and medial temporal lobe prominent suggesting early uncal herniation. There is compression of the suprasellar cistern. There are no signs of downward herniation. The facial bone CT shows bilateral acute ethmoidal sinusitis and fibrosis and chronic changes of the nose and ethmoid and sphenoid sinuses, status post multiple infections and prior surgeries. There is destruction of the lamina papyracea. The patient was  transferred to [facility] for further management. The [facility] service was consulted over the phone by the ER physician from the outside hospital. Due to the CAT scan findings, 100 g of mannitol and moderate hyperventilation were recommended. The patient already had received 1 dose of vancomycin  and aztreonam at the outside hospital. We recommended the addition of Flagyl for extended anaerobic coverage. The patient has allergy to PENICILLIN. The patient was transferred intubated and stable hemodynamically to [facility]. He underwent craniotomy on 12/6 and cx revealed MSSA and antibiotics narrowed to ceftriaxone.    He was transferred out of the ICU 12/10. He had been seen by ID and ENT. Per ENT he will need outpatient follow up with an ENT Rhinologist on follow up, with Dr. Thedore Mins. He remained on ceftriaxone. A PICC was placed 12/7 and he will need weeks of antibiotics- end date not determined yet. He will need to follow up with ID in 1 week. He had some ongoing encephalopathy. His steroids were tapered off. He will need to follow up with Dr. Jaynie Collins in 1-2 weeks for stable removal.  He was started on oxycontin for persistent severe headache. This should be weaned as tolerated.     He had uncontrolled DM. This was previously managed in [place] and he had no PCP here.  He was referred for TCM and his wife is making PCP follow up. He was on lantus in the hospital for elevated blood sugars. Metformin was added. His sugars should be followed off of steroids and lantus weaned as tolerated. His Hemoglobin A1C was 9.8 so he may be able to be managed with PO meds only.    He was seen by PT/OT and acute rehab was recommended        Discharge Medications:        Medication List       As of 10/07/2013 10:30 AM      START taking these medications           cefTRIAXone 2 G injection    Commonly known as: ROCEPHIN    Inject 2,000 mg (2 g total) into the vein 2 (two) times daily.        enoxaparin 40 MG/0.4ML Soln     Commonly known as: LOVENOX    Inject 0.4 mLs (40 mg total) into the skin daily.        famotidine 20 MG tablet    Commonly known as: PEPCID    Take 1 tablet (20 mg total) by mouth every 12 (twelve) hours.        insulin aspart 100 UNIT/ML injection    Commonly known as: NovoLOG    Inject 1-12 Units into the skin as needed for High Blood Sugar.        insulin glargine 100 UNIT/ML injection    Commonly known as: LANTUS    Inject 35 Units into the skin every morning.        oxyCODONE 10 MG 12 hr tablet    Commonly known as: OxyCONTIN    Take 1 tablet (10 mg total) by mouth every 12 (twelve) hours.        saline 0.65 % Soln    Commonly known as: OCEAN NASAL SPRAY    2 sprays by Each Nare route as needed.        senna-docusate 8.6-50 MG per tablet    Commonly known as: PERICOLACE    Take 2 tablets by mouth nightly.        simvastatin 40 MG tablet    Commonly known as: ZOCOR    Take 1 tablet (40 mg total) by mouth daily with dinner.         CONTINUE taking these medications  metFORMIN 500 MG tablet    Commonly known as: GLUCOPHAGE        oxyCODONE-acetaminophen 5-325 MG per tablet    Commonly known as: PERCOCET         STOP taking these medications           ciprofloxacin 500 MG tablet    Commonly known as: CIPRO        methylPREDNISolone 4 MG tablet    Commonly known as: MEDROL DOSEPACK               Where to get your medications     These are the prescriptions that you need to pick up.    You may get these medications from any pharmacy.           cefTRIAXone 2 G injection             Information on where to get these meds is not yet available. Ask your nurse or doctor.           enoxaparin 40 MG/0.4ML Soln    famotidine 20 MG tablet    insulin aspart 100 UNIT/ML injection    insulin glargine 100 UNIT/ML injection    oxyCODONE 10 MG 12 hr tablet    saline 0.65 % Soln    senna-docusate 8.6-50 MG per tablet    simvastatin 40 MG tablet                   Discharge Instructions:     DISPOSITION:  Acute  rehab    INSTRUCTIONS:  You will be on antibiotics for weeks. Follow up with the Infectious Disease doctor in 1 week to monitor progress and determine when you can stop the antibiotics  Remove staples on around 10/15/13 or follow up with Dr. Jaynie Collins in the office    Diet:ADA    Condition:stable    Studies pending on Discharge:fungal operative cultures    Follow-up with TCM in as scheduled  Dr. Jaynie Collins in 1-2 weeks  Dr. Georgiana Spinner in 1 week  Dr. Thedore Mins (ENT) in 2-3 weeks      Signed by: Ignacia Felling, MD      Time spent on discharge was 35 minutes including discharge planning, reviewing plan, pertinent tests, and medications with the patient.

## 2013-10-07 NOTE — H&P (Signed)
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 year old male with extensive history of sinus problems was admitted to [facility] on 12/4 with altered mental status and headache.  After imaging demonstrated large brain abscess with extensive edema and shift, he was intubated, received mannitol, and transferred to [facility], where he underwent urgent craniotomy with drainage of abscess.  Cultures + for MSSA and he will receive a long course of high dose Ceftriaxone.  Hospital course notable for uncontrolled blood sugars, headaches, and mild dysphagia.    Refused to participate in PT this am, citing headaches.    Functional history:  Premorbidly, was independent with mobility and self-care.  At time of admission, requires min assist with sit to stands, transfers, gait, and ADLs.    Past Medical History:   Past Medical History   Diagnosis Date   . Seasonal allergic rhinitis    . Diabetes mellitus      Medications:      Scheduled Meds:  Current Facility-Administered Medications   Medication Dose Route Frequency   . cefTRIAXone  2 g Intravenous Q12H SCH   . docusate sodium  100 mg Oral BID   . enoxaparin  40 mg Subcutaneous Daily   . famotidine  20 mg Oral Q12H SCH   . insulin glargine  35 Units Subcutaneous QAM   . metFORMIN  1,000 mg Oral BID Meals   . oxyCODONE  10 mg Oral Q12H SCH   . Senna  17.2 mg Oral QHS   . simvastatin  40 mg Oral Daily with dinner     Continuous Infusions:   PRN Meds:.acetaminophen, alum & mag hydroxide-simethicone, bisacodyl, butalbital-acetaminophen-caffeine, chlorproMAZINE, dextrose, dextrose, glucagon (rDNA), insulin aspart, ondansetron, oxyCODONE, oxyCODONE, saline, zolpidem      Allergies:   Allergies   Allergen Reactions   . Morphine    . Penicillins        Social History:       Living arrangements: Lives with wife in a hotel - recently relocated to  this area  Son can assist/supervise on discharge        Family History: Noncontributory to abscess    Review of systems:  Pertinent positives [id number] and Abdominal pain  A full review of systems was obtained and was otherwise negative.    Physical Examination:   Filed Vitals:    10/08/13 0548   BP: 101/67   Pulse: 64   Temp: 96.8 F (36 C)   Resp: 18   SpO2: 95%     Estimated Body mass index is 39.45 kg/(m^2) as calculated from the following:    Height as of 09/30/13: 5\' 10" (1.778 m).    Weight as of 09/30/13: 274 lb 14.6 oz(124.7 kg).    General appearance: Obese. In visible discomfort.  Eyes:Anicteric sclerae. Conjunctivae non-injected.  HENT:Moist mucous membranes.  CV:+S1S2 Heart rate and rhythm are regular. No significant lower limb edema.  Pulm:Lungs are clear to auscultation bilaterally. No wheezes, rales, or rhonchi. Good respiratory effort.  JWJ:XBJY. Non-tender. Normoactive bowel sounds.  Skin:Incision is c/d/i.  Neuro: Bradykinetic. Sensation intact.        Manual muscle strength testing:  Muscle group Left Right   Shoulder abductors 5 5   Elbow flexors 5 5   Elbow extensors 5 5   Finger flexors 5 5   Hip flexors  5 5   Knee flexors 5 5   Knee extensors 5 5   Ankle dorsiflexors 5 5   Ankle plantarflexors 5 5   EHL 5 5     Psych:Alert; slowed deliberate speech, poor initiation    Labs:   Lab Results   Component Value Date    WBC 14.00* 10/07/2013    HGB 13.4 10/07/2013    HCT 40.6* 10/07/2013    MCV 89.6 10/07/2013    PLT 233 10/07/2013     Lab Results   Component Value Date    BUN 22.0* 10/06/2013     Lab Results   Component Value Date    CREAT 0.7 10/06/2013     Lab Results   Component Value Date    NA 138 10/07/2013    K 4.0 10/06/2013    CL 104 10/06/2013    CO2 27 10/06/2013     Lab Results   Component Value Date    ESR 32* 10/03/2013     Lab Results   Component Value Date    CRP 2.9* 10/03/2013     Assessment:   1. 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 Poorly controlled DM  3. Sinus  disease  4. OSA on CPAP  5. Obesity  6. Mild dysphagia    [x]  Requires an intensive inpatient rehabilitation program with multidisciplinary therapies, rehab nursing, and close physician management.    [x]  The following co-morbidities may complicate rehabilitation:  Dysphagia and Uncontrolled diabetes mellitus    [x]  Is at risk for the following complications:  Injurious falls, Venous thromboembolic disease and Hypoglycemia        Individualized Plan of Care:    - REHAB: Begin comprehensive and intensive inpatient rehab program, including:  Physical therapy 60-120 min daily, 5-6 times per week  Occupational therapy  60-120 min daily, 5-6 times per week  Speech therapy  60-120 min daily, 5-6 times per week  Therapeutic recreation  Psychology  Dietician consultation  Case management  Rehabilitation nursing    Will work in an interdisciplinary manner to address the following impairments and issues:   Mobility, ADLs, Cognitive impairments, Communication impairments, Dysphagia, Impaired strength, Impaired endurance, Caregiver training, Adjustment to disability, Community support and resources and Impaired coordination    Requires 24h rehabilitation nursing to address:  Glucose control, Medication teaching and Safety    Anticipate a discharge to:  Home with assistance    Estimated length of stay: 10-14 days      Goals for discharge:  Bed mobility Supervision   Transfers Supervision   Locomotion Supervision   Upper body dressing Supervision   Lower body dressing Supervision   Bathing Supervision   Toileting Supervision   Swallow Tolerate least restrictive oral diet without signs or symptoms of aspiration     Cognition Use compensatory strategies appropriately to compensate       Rehab potential: good    Prognosis: good    - Continue Rocephin  - Continue metformin and Lantus for DM  - Lovenox for DVT prophylaxis  - Nocturnal CPAP for OSA  - prn Fioricet for headaches; will increase roxi to 15 mg and oxycontin to q8h for  severe pain  - Zocor for dyslipidemia  - Will request ID consult for Monday    Review of Pre-admission assessment  [x]  I have reviewed the nurse liaison's pre-admission assessment in [employer].   I do not note any significant changes at this time and agree with patient's appropriateness for  intensive inpatient rehab program provided he agrees to participate in future sessions.

## 2013-10-07 NOTE — Plan of Care (Signed)
Pt a/o x 3 with periods of confusion. States pain level at acceptable level. No chest pain, sob or chest pain throughout shift. Fall precautions in place, bed alarm on, pt assisted oob to restroom as needed. No difficulty swallowing. Will continue to monitor.

## 2013-10-07 NOTE — OT Eval Note (Signed)
[facility]   Occupational Therapy Evaluation     Patient: [name] Brown    MRN#: [id number]   Unit: [facility]  Bed: F-6/.23                                     Discharge Recommendations:   Discharge Recommendation: Acute Rehab        If Acute Rehab recommended discharge disposition is not available, patient will need min-mod assist for self care and mobility, shower seat equipment, and bridge program.       Assessment:   [name] Brown is a 47 y.o. male admitted 09/30/2013 with decreased balance, decreased ability to modulate sensory stimulation, decreased insight and safety all limiting I with adl function    Impairments: Assessment: balance deficits;decreased independence with ADLs;sensory impairment;decreased safety awareness;decreased attention;decreased cognition    Therapy Diagnosis: decreased I with adl function    Rehabilitation Potential: Prognosis: Good;With continued OT s/p acute discharge     Treatment Activities: evaluation and neuro re education, with patient and wife educated re reducing stimulation and monitoring patients level of agitation to stimulation, reducing input to better be able to function  Educated the patient to role of occupational therapy, plan of care, goals of therapy and safety with mobility and ADLs, sensory stimulation.    Plan:   OT Frequency Recommended: 3-4x/wk     Treatment Interventions: ADL retraining;Functional transfer training;Endurance training;Patient/Family training;Equipment eval/education;Neuro muscular reeducation     Risks/benefits/POC discussed          Precautions and Contraindications:   Precautions  Weight Bearing Status: no restrictions  Other Precautions: falls      Consult received for [name] Brown for OT Evaluation and Treatment.  Patient's medical condition is appropriate for Occupational Therapy intervention at this time.    Admitting Diagnosis: Abscess of brain [324.0]  Brain abscess [324.0] (Brain abscess)      History of Present Illness:     [name] Brown is a 47 y.o. male admitted on 09/30/2013 with large right frontal abscess with history of multiple sinus surgeries and recent sinus infection per report. Patient presented to [facility] today with complaints of AMS x 1 day. HCT obtained shows a large right frontal abscess with ventricular effacement and 2 cm anterior leftward midline shift. Neurosurgical consultation requested.    Patient was intubated prior to transport, limited neuro exam. Wife is en route. History obtained per report and review of [facility] ED records.      TITLE OF PROCEDURE:  1.  Reexploration of bicoronal exposure for craniotomy for evacuation of  right frontal intracerebral abscess.  2.  Intraoperative magnification.      Past Medical/Surgical History:  DM  Multiple sinus infections  HLD  Sleep apnea      Imaging/Tests/Labs:  CT head post op:  IMPRESSION:    1. Postsurgical changes from recent evacuation of right frontal    intracerebral abscess. Evaluation is limited secondary to noncontrast    CT technique; however a residual collection containing air, fluid and    hemorrhage may be present within the right frontal lobe measuring    approximately 4.4 x 3.0 cm.       Social History:   Prior Level of Function:  Prior level of function: Ambulates / Performs ADL's independently  Baseline Activity Level: Community ambulation  [place]  Dressing - Upper Body: independent  Dressing -  Lower Body: independent  Feeding: independent  Bathing: independent  Grooming: independent  Toileting: independent  DME Currently at Home:  (none)    Home Living Arrangements:  Living Arrangements: Spouse/significant other  Type of Home:  (staying in a hotel at this time, purchasing a home)  DME Currently at Home:  (none)  Home Living - Notes / Comments: attempting to purchase 3 level home      Subjective:     Patient is agreeable to participation in the therapy session.     Patient states that I don't want to do anything right now,  however with decreased stimulation, time, and gentle encouragement patient completed all requested tasks  Pain:       Pain Assessment  Pain Assessment: Numeric Scale (0-10)  Pain Score: 4-moderate pain  POSS Score: Awake and Alert  Pain Location: Head  Pain Intervention(s): Repositioned      Objective:        Observation of Patient/Vital Signs:  Patient is in bedside chair with dressings in place.    Cognitive Status and Neuro Exam:  Cognition  Arousal/Alertness: Delayed responses to stimuli  Attention Span: Attends to task with redirection  Orientation Level: Oriented X4  Following Commands: maximal verbal instruction  Safety Awareness: moderate verbal instruction  Insights: Decreased awareness of deficits (unable to state deficits)  Problem Solving: Assistance required to generate solutions;Assistance required to implement solutions  Decreased volition    Neuro Status  Behavior: aggitated;distractable;impulsive;flat affect  Motor Planning: decreased processing speed         Musculoskeletal Examination  Gross ROM  Right Upper Extremity ROM: within functional limits  Left Upper Extremity ROM: within functional limits  Right Lower Extremity ROM: within functional limits    Gross Strength  Right Upper Extremity Strength: within functional limits  Left Upper Extremity Strength: within functional limits  Right Lower Extremity Strength: within functional limits  Left Lower Extremity Strength: within functional limits         Sensory/Oculomotor Examination  Sensory  Auditory: intact  Tactile - Light Touch: intact    Vision - Complex Assessment  Ocular Range of Motion: Within Functional Limits  Tracking: Able to track stimulus in all quads without difficulty  Additional Comments:  (decreased attention)    Activities of Daily Living  Self-care and Home Management  Eating: stand by assistance;Setup  Grooming: stand by assistance  Bathing: stand by assistance  UE Dressing: stand by assistance  LE Dressing: minimal  assistance  Functional Transfers: minimal assistance  Additional Comments:  (cues required to attend, initiate, complete task, )    Functional Mobility:  Mobility and Transfers  Sit to Stand: Min assist  Bed to Chair: Min assist  Functional Mobility/Ambulation: Min assist     Consulting civil engineer Sitting Balance: with supervision  Dyanamic Sitting Balance: with supervision  Static Standing Balance:  (CGa)  Dynamic Standing Balance: with minimal assist    Participation and Activity Tolerance  Participation and Endurance  Participation Effort: good  Endurance: Tolerates 10 - 20 min exercise with multiple rests    Patient left with call bell within reach, all needs met and all questions answered, RN notified of session outcome and patient response.      Goals:  Time For Goal Achievement: 5 visits  ADL Goals  Patient will dress lower body: with supervision  Mobility and Transfer Goals  Pt will perform functional transfers: with supervision  Neuro Re-Ed Goals  Pt will perform dynamic standing balance: with  supervision;to complete standing ADLs safely     Executive Fucntion Goals  Pt will demonstrate insight: to increase ability to complete ADLs                Patient left up in the chair and wife present ,RN aware     Time of treatment:   OT Received On: 10/07/13  Start Time: 1100  Stop Time: 1140  Time Calculation (min): 40 min            Luiz Iron OT [phone]

## 2013-10-08 LAB — GLUCOSE WHOLE BLOOD - POCT
Whole Blood Glucose POCT: 277 mg/dL — ABNORMAL HIGH (ref 70–100)
Whole Blood Glucose POCT: 140 mg/dL — ABNORMAL HIGH (ref 70–100)
Whole Blood Glucose POCT: 232 mg/dL — ABNORMAL HIGH (ref 70–100)
Whole Blood Glucose POCT: 149 mg/dL — ABNORMAL HIGH (ref 70–100)

## 2013-10-08 MED ORDER — OXYCODONE HCL 5 MG PO TABS
15.0000 mg | ORAL_TABLET | ORAL | Status: DC | PRN
Start: 2013-10-08 — End: 2013-10-18
  Administered 2013-10-08 – 2013-10-18 (×25): 15 mg via ORAL
  Filled 2013-10-08 (×26): qty 3

## 2013-10-08 MED ORDER — OXYCODONE HCL ER 10 MG PO T12A
10.0000 mg | EXTENDED_RELEASE_TABLET | Freq: Three times a day (TID) | ORAL | Status: DC
Start: 2013-10-08 — End: 2013-10-18
  Administered 2013-10-08 – 2013-10-18 (×30): 10 mg via ORAL
  Filled 2013-10-08 (×29): qty 1

## 2013-10-08 MED ORDER — OXYCODONE HCL 5 MG PO TABS
7.5000 mg | ORAL_TABLET | ORAL | Status: DC | PRN
Start: 2013-10-08 — End: 2013-10-18

## 2013-10-08 NOTE — Rehab Evaluation ([facility]) (Addendum)
Corrected 10/08/2013 11:28:56 AM    NAME: [name] Brown  MRN: [id number]  Account: [id number]  Session Start: 10/08/2013 9:00:00 AM  Session Stop: 10/08/2013 9:30:00 AM    Physical Therapy  Inpatient Rehabilitation Eval    Rehab Diagnosis: Gait Abnormality  Demographics:            Age: 47Y            Gender: Male  Primary Language: [place]  Past Medical History: Past Medical History:  No past medical history on file      PAST SURGICAL HISTORY:  As above, plus multiple sinus surgeries and question of resection of  frontal lobe during one of those surgeries.  History of Present Illness: [name] Brown is a 47 y.o. unknown handed male who  presents to the hospital on 09/30/2013 with a large right frontal abscess with  history of multiple sinus surgeries and recent sinus infection per report.  Patient presented to [facility] today with complaints of AMS x 1 day. HCT  obtained shows a large right frontal abscess with ventricular effacement and 2  cm anterior leftward midline shift. The pt was transfered to [facility] on 09/30/13. He  underwent Reexploration of bicoronal exposure for craniotomy for evacuation of  right frontal intracerebral abscess by Dr. Madaline Savage on 09/30/13. As of  10/06/13 the plan of care per the medicine team:  Plan:    R frontal brain abscess, MSSA s/p craniotomy, removal of SDH. Will need ENT f/u.  Will continue to taper steroids. Na tablet taper    Headache- start oxycontin, prn fioricet    Uncontrolled DM- states he was on 2 po meds at home, continue lantus. Will start  Metformin and glipizide    OSA- will order home CPAP    ppx- Lovenox   Date of Onset: 09/30/13   Date of Admission: 10/07/2013 8:46:00 PM  Premorbid Functional Level: Pt. was independent with mobility and self care.  Social/Education History: Per patient report, he is married and has several  children. Pt. just moved here from [place]. 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 is living in a hotel, as he just relocated here.    Medications and Allergies: Significant rehabilitation considerations:   Refer to Epic  Rehabilitation Precautions/Restrictions:   Risk for falls, Aspiration/swallowing    SUBJECTIVE  Patient/Caregiver Goals:  Patient's functional goals: "to be able to care for  self and not need any assistance when walking"  Pain: Patient currently has pain.  Location: Head ache  Type: Acute  Quality: Aching.  Pain Scale: Numeric.  Patient reports a pain level of 7 out of 10.  Patient's acceptable level of pain 0 out of 10.   Interferes with physical activity. Participation in therapy and participating  in conversation.  Pain is alleviated by: Medication  Pain is exacerbated by: Noise, movement Patient medicated.  Pain Reassessment:  Response to Pain Intervention: Pt. with no response from medication after half  hour of session and continues to refuse to participate due to pain.  Post Intervention Pain Quality:  Aching.  Patient Reports Post Intervention Pain Level of: 7 out of 10  Pain Acceptable: No: Pt. has received pain medication. Suggested patient rest or  lay down, but patient continues to sit at edge of bed reporting "I am resting.  This is me  resting."    OBJECTIVE  General Observations: Pt. received following OT evaluation. RN in room to  administer pain medication and hook patient up to IV antibiotics. Pt. with blunt  affect, apathetic in nature, and increased response time when answering  questions. Pt. with limited answers to questions not engaging in evaluation,  reporting pain is too high to do anything right now.  Understanding of Current Condition: A/O x3, understands information given but  slow to respond    Communication/Cognition: Pt. presents with decreased processing, requiring  increased time when responding in conversation. Pt. has decreased memory noted,  i.e. forgetting that he  had just been given a Lovenox shot 5 minutes prior. Pt.  is withdrawn and apathetic.  Sensation: Intact  Range of Motion: [facility]  Strength/Motor Control: Pt. with good strength in bilateral LEs noted with gross  strength test with resistance. Pt. did not complete formal assessment fully, as  he refused to continue with task. Pt. is slow moving and requires increased  time.  Balance: Patient with good sitting balance static and dynamic. Pt. refusing  mobility and standing during PT evaluation. Per report from OT, pt. able to  stand for ADL with stand by assist/set up. Per inpatient PT note, pt. able to  perform dynamic standing balance with Min A.    Therapeutic/Functional Mobility:            Bed Mobility: Pt. is able to go from sup<>sit without assistance.      Interventions: None provided today.    Interdisciplinary Educational Needs and Learning Preferences:       Learning Preference: The patient's preferred learning method is:  Explanation.  The patient's preferred learning method is: Demonstration.       Barriers to Learning: Desire and motivation.  Cognitive limitations.  Emotions.       Learning Needs: Pain management.  Plan of care.  Rehabilitation techniques and procedures.  Safety.  Nutrition/feeding.  Skin/wound care.  Functional activities/mobility.  Equipment.  Communication/cognition.  Swallowing.  Leisure/vocational.  Medical management.    Education Provided: Plan of care. Rehab techniques and procedures. Purpose of PT         Audience: Patient.       Mode: Explanation.       Response: Needs reinforcement.    ASSESSMENT  Summary of Deficits and Prognosis: Pt. presents with decreased motivation, pain,  and cognitive processing limiting his ability to participate in physical  activity. He has poor activity tolerance and balance limitations. Pt. has good  overall strength, but decreased speed of movement. Pt. will require further  mobility assessment when pain is lessened and pt. is able to participate  more.  Pt. would benefit from skilled physical therapy intervention to increase  activity tolerance and independence with functional mobility.  Rehab Potential: Good premorbid functional status, Good premorbid medical  status, Living in the community premorbidly  Barriers to Progress/Discharge: Poor activity tolerance, Poor pain tolerance,  Limited motivation, Reduced insight    Functional Measures      TRANSFERS BED/CHAIR/WHEELCHAIR: Bed/chair/wheelchair Transfer Score = 4.  Patient performs 75% or more of effort and minimal assistance (little/incidental  help/lifting of one limb/steadying) for transferring to and from the  bed/chair/wheelchair, requiring: Contact guard.  Patient requires the following assistive device(s): No assistive devices were  required. Pt. refused transfer during PT evaluation, but required Min A per  report from OT to stand up.    LOCOMOTION WHEELCHAIR:   Wheelchair did not occur because patient refused.  Pt. in too much pain.    LOCOMOTION WALK:   Walking did not occur because patient refused. Pt. in too much pain.    LOCOMOTION STAIRS: Stairs did not occur because patient refused. Pt. in too much  pain.    PAI Social Interaction (Correction):    SOCIAL INTERACTION: Social Interaction Score = 3, Moderate Direction. Patient  interacts appropriately 50-74% of the time.  Patient requires moderate/some  direction for the following behavior(s): Adjustment/coping, Withdrawn,  Cooperation, Social behaviors    MEMORY: Memory Score = 4, Minimal Prompting. Patient recognizes and remembers  75-90% of the time. Patient requires minimal/occasional prompting for memory for  the following: Remembering immediate information/instructions (after minutes)    COMPREHENSION: Auditory comprehension is the usual mode. Comprehension Score =  6, Modified Independence.  Patient comprehends complex/abstract information in  their primary language, requiring: Additional time.    PAI Expected Mode of Locomotion at  Discharge: The expected mode of most  frequently used locomotion, at discharge, is expected to be walking.    IRFPAI Goals    Bed, Chair, Wheelchair Transfers: 6  Mode of Locomotion: W  Walk: 5  Stairs: 5    Short Term Goals:  Time frame to achieve short term goal(s): 3-5 days from  initial evaluation on 10/08/13       1. Pt. will tolerate gait assessment       2. Pt. will tolerate stair assessment.  Long Term Goals:   Time frame to achieve long term goal(s): 10-14 days from  initial evaluation on 10/08/13       1. Pt. will perform functional transfers Mod I for safety in home  environment.       2. Pt. will ambulate >150 feet with supervision and LRAD in order to access  room in hotel that he is living in.       3. Pt. will negotiate flight of stairs for increased accessibility to  community with supervision.    Risks/Benefits of Rehabilitation Discussed with Patient/Caregiver: Yes.  Recommendations/Goals for Rehabilitation Discussed with Patient/Caregiver: Yes.    PLAN  Physical Therapy Plan: Physical Therapy is recommended.  Recommended Frequency/Duration/Intensity: 60-120 minutes per day; 5-6 days per  week for 10-14 days from initial evaluation on 10/08/13  Activities Contributing Toward Care Plan: transfer training, gait training,  stair training, balance training, ther ex, endurance training, neuromuscular  reeducation, DME procurement as needed, d/c planning, family training.    Team Care Plan  Please review Integrated Patient View Care Plan Flowsheet for Team identified  Problems, Interventions, and Goals    Identified problems from team documentation:      Identified problems from this assessment:     Mobility : Pt. will be able to transfer and ambulate >150 feet with supervision  to return to current living situation in hotel setting.    Discipline:  Physical Therapy    3 Hour Rule Minutes: 30 minutes of PT treatment this session count towards  intensity and duration of therapy requirement. Patient was  not seen for the full  scheduled time of PT treatment this session secondary to:   Patient not able to tolerate due to 7/10 headache pain. Pt. refusing to  participate in any functional tasks.  Will attempt to see patient tomorrow,  as scheduled    Signed by: Chauncey Cruel, PT 10/08/2013 9:30:00 AM

## 2013-10-08 NOTE — Rehab Progress Note ([facility]) (Signed)
NAMEHANNAN HUTMACHER  MRN: [id number]  Account: [id number]  Session Start: 10/08/2013 12:00:00 AM  Session Stop: 10/08/2013 12:00:00 AM    SEVERE SEPSIS SCREEN  INFECTION:  Patient is on antibiotic therapy (not prophylaxis).    SYSTEMATIC IMFLAMMATORY RESPONSE SYNDROME (SIRS):  Patient  has no indication of  systematic inflammatory response syndrome (SIRS). Negative Sepsis Screen.  If you are unable to assess a system's dysfunction because you do not have labs,  or the labs you have are not current (within 24 hours), call physician and  request and order for the lab tests needed.    Signed by: Arlyce Dice, RN 10/08/2013 8:15:00 AM

## 2013-10-08 NOTE — Rehab Progress Note ([facility]) (Signed)
[name] Brown  MRN: [id number]  Account: [id number]  Session Start: 10/08/2013 12:00:00 AM  Session Stop: 10/08/2013 12:00:00 AM    Rehabilitation Nursing  Inpatient Rehabilitation Shift Assessment    Rehab Diagnosis: Gait Abnormality  Demographics:            Age: 47Y            Gender: Male  Primary Language: English    Date of Onset:  09/30/13  Date of Admission: 10/07/2013 8:46:00 PM    Rehabilitation Precautions Restrictions:   Risk for falls, Aspiration/swallowing    Patient Report: My head hurts  Pain: Patient currently has pain.  Location: head  Type: Acute  Quality: Aching.  Pain Scale: Numeric.  Patient reports a pain level of 8 out of 10.  Patient's acceptable level of pain 5 out of 10.   Interferes with physical activity.  Pain is alleviated by: pain medicine  Pain is exacerbated by: movement Patient medicated. Schduled Oxycontin and  Oxycodon PRN admnistered  Pain Reassessment:  Response to Pain Intervention: mild relief reported  Post Intervention Pain Quality:  Aching.  Patient Reports Post Intervention Pain Level of: 4 out of 10  Pain Acceptable: Yes  Patient/Caregiver Goals:  "to be able to care for self"    NEURO  Orientation/Awareness: Alert and Oriented x3.   forgetful  Speech: Clear.  Behavior: Cooperative.  Uncooperative.  Impulsive.    MEDICATIONS  IV Access: Patient has IV access.     Type: Double Lumen PICC  Location: RUE Length of line:  Care Provided:   Flushed after medication with 5ml normal saline followed by 2.64ml (100u heparin  /ml). Using SASH method. Date Inserted:   12/7  Dialysis Access: Patient does not have dialysis access.      Elopement Risk Level Assessment Tool  Patient Criteria: Patient is not capable of leaving the unit.  Assessment is not  applicable.      RISK ASSESSMENT FOR FALLS/INJURY    MENTAL STATUS CRITERIA:   10 - Impaired Judgement/impulsive.   MENTAL STATUS TOTAL: 10    AGE CRITERIA:   21 - < 51 years old  AGE TOTAL: 0    ELIMINATION CRITERIA:   3 - Toileting  with Assistance.   ELIMINATION TOTAL: 3    HISTORY OF FALLS CRITERIA:   2 - Unknown History.  HISTORY OF FALLS TOTAL: 2    MEDICATIONS CRITERIA:   2 or more High Risk Medications (*see list below)   MEDICATIONS TOTAL: 2    PHYSICAL MOBILITY CRITERIA:   3 - Decreased balance reaction.   1 - Weakness/impaired physical mobility.   PHYSICAL MOBILITY TOTAL: 4    FALLS RISK ASSESSMENT TOTAL: 21    Patient's Fall Risk: TOTAL SCORE >10: High Risk    Falls Interventions: Clutter removed and clear path to BR.  Call bell, phone, glasses, etc within reach.  Hourly toileting/safety checks between 6am and 10pm, then every 2 hours.  Initiate Fall care plan and outcome.  Yellow "high risk" patient identification in place: wrist band, socks, chart  sticker, door sign.  Pt and family education.  Reoriented PRN.  Family at bedside.  Bed alarm    NUTRITION  Diet:  Type: Consistent carbohydrate.  Food Consistency: Regular.  Liquid Consistency: Thin.   Fluid restriction of 1500 mL free water daily; 310 mL PO fluid intake this  shift; encouraged increased intake.      CARDIOVASCULAR     Bilateral lower extremities  Nail  Bed Color: Pink.   No edema or redness present.  Pulses:   Apical Pulse: Regular. Strong. Rate is 64 .   Patient does not have a pacemaker.   Patient does not have a defibrillator.    CARDIOPULMONARY  Lung Sounds:   Upper lobes. Clear.   Lower lobes. Clear.  Type of Respirations: Regular.  Cough: No cough noted.  Respiratory Support: The patient does not require any respiratory support.  Respiratory Equipment: 95% oon RA    INTEGUMENTARY  Skin:  Temperature: Warm  Turgor: Normal for age  Moisture: Dry  Color of skin: Normal for Race/Ethnicity  Capillary Refill: Less than 3 seconds  Wounds/Incisions:     Surgical Incision: Cranial incision from ear to ear incision Length: 32  centimeter(s) with sutures.  Drainage: Incision without drainage.  Odor:  No  Incision Care: Per protocol.  Braden Scale for Predicting Pressure Sore  Risk: Sensory Perception: No  impairment  Moisture: Rarely moist  Activity: Walks occasionally  Mobility: Slightly limited  Nutrition: Adequate  Friction and Shear: Potential problem  Braden Score: 19  Level of Risk: No risk (19-23). Will reassess every shift.    GASTROINTESTINAL  Abdomen: Soft. Nontender.  Bowel Sounds:  Active bowel sounds audible in all four quadrants.  Date of Last Bowel Movement:  12/12   No Problems/Complaints with Bowel Elimination Assessed.    GENITOURINARY  Current Bladder Pattern: Continent  Color:  Yellow   Patient denies problems with urination and/or catheter.    MUSCULOSKELETAL  Upper Body: Generalized weakness  Lower Body: Gait abnromality and Generalized weakness    Functional Measures    EATING: Eating Score = 7. Patient is completely independent for eating.  There  are no activity limitations.    TOILETING: Patient requires no physical assistance for adjusting clothing before  using a toilet, commode, bedpan, or urinal. Patient requires no physical  assistance for hygiene. Patient requires moderate assistance for adjusting  clothing after using a toilet, commode, bedpan, or urinal. Patient performs  66.67 % of toileting tasks. Toileting Score = 3, Moderate Assistance.  Patient requires the following assistive device(s):  Grab bar.    BLADDER MANAGEMENT - LEVEL OF ASSIST: Bladder Score = 7. Patient is completely  independent for bladder management. There are no activity limitations.    BLADDER ACCIDENTS THIS SHIFT:  0 . Patient has not had an accident this  assessment and did not require medications or devices.    BOWEL MANAGEMENT - LEVEL OF ASSIST: Bowel Score = 6.  Patient is modified  independent for bowel management.  Patient did not have bowel movement.  Medication/intervention was provided.    BOWEL ACCIDENTS THIS SHIFT: 0 . Patient has not had an accident, but used a  stool softener.    TRANSFER TOILET: Toilet Transfer Score = 4.  Patient performs 75% or more of  effort and  minimal assistance (little/incidental help/steadying) for  transferring to and from the toilet/commode, requiring: Contact guard.  Patient requires the following assistive device(s):  Grab bars.    COMPREHENSION: Auditory comprehension is the usual mode. Comprehension Score =  6, Modified Independence.  Patient comprehends complex/abstract information in  their primary language, requiring: Additional time.    EXPRESSION: Vocal expression is the usual mode. Expression Score = 6, Modified  Independent.  Patient expresses complex/abstract information in their primary  language, requiring: Additional time.    SOCIAL INTERACTION: Social Interaction Score = 7, Independent. Patient is  completely independent for social interaction.  There are no activity  limitations.    PROBLEM SOLVING: Patient does not make appropriate decisions in order to solve  complex problems without assistance from a helper. Problem Solving Score = 5,  Supervision.  Patient makes appropriate decisions in order to solve routine  problems with directing only under stressful or unfamiliar conditions, but no  more than 10% of the time, for the following behavior(s): Attending to tasks,  Processing information, Inhibiting impulsivity, Recognizing risks    Education Provided:    Education Provided: Medication. Name and dosage. Administration. Purpose. Side  Effects. Pain meication.  Rehab schedule and paricipation .       Audience: Patient and significant other.       Mode: Explanation.       Response: Verbalized understanding.    Discharge: Patient is not being discharged at this time.    Long Term Goals:  Short Term Goals: Pt will consistently use call light for assistance prior to  getting OOB/toilet to prevent fall.  Pt will report pain control at/or<3/10 prior to  and during therapy session with  medications and alternative.  Pt will report any s/s of incision/wound infection in order to receive timely  medical intervention.  7 to 10 days from  10/07/13    PROGRESS TOWARD GOALS: Refused participation in therapy sessions this shift d/t  headache rated at level 8 of 10 relieved to 4 to 5 of 10 with scheduled 12 hour  Oxycontin and Oxycodone PRN.  At start of shift, refused bed alarm and non-skid  socks, but with education regarding fall prevention, agreed to both.  Called  appropriately this shift.    PLAN: Nursing Specific Interventions  Diabetic Management. Medical Condition Management. Medication Management. Pain  Management. Skin Management. Wound Management.  Continue with the current Nursing Plan of Care.    TEAM CARE PLAN  Identified problems from team documentation:  Problem: Impaired Mobility  Mobility: Primary Team Goal: Pt. will be able to transfer and ambulate >150 feet  with supervision to return to current living situation in hotel setting. [name]      Add/Update Problems from this assessment:  No updates at this time.    Please review Integrated Patient View Care Plan Flowsheet for Team identified  Problems, Interventions, and Goals.    Signed by: Arlyce Dice, RN 10/08/2013 6:00:00 PM

## 2013-10-08 NOTE — Rehab Progress Note ([facility]) (Signed)
[name]  MRN: [id number]  Account: [id number]  Session Start: 10/08/2013 11:15:00 AM  Session Stop: 10/08/2013 11:15:00 AM    Speech Language Pathology  Inpatient Rehabilitation Exception Note    Patient was unable to complete planned cognitive evaluation. Pt c/o severe  headache with pain 8 out of 10.  Nursing notified. Nursing reports pt received  pain meds approximately 25 minutes ago in addition to schedulled pain meds.  0 minutes of SLP treatment this session count towards intensity and duration of  therapy requirement. Patient was not seen for the full scheduled time of SLP  treatment this session secondary to: Patient refused.  Will attempt to see patient tomorrow,  as scheduled    Signed by: Tildon Husky, M.Ed., CCC-SLP 10/08/2013 11:15:00 AM

## 2013-10-08 NOTE — Rehab Evaluation ([facility]) (Signed)
[name] Brown  MRN: [id number]  Account: [id number]  Session Start: 10/08/2013 12:00:00 AM  Session Stop: 10/08/2013 12:00:00 AM    Nutrition  Inpatient Rehabilitation Initial Assessment    Rehab Diagnosis: Gait Abnormality  Demographics:            Age: 47Y            Gender: Male  Primary Language: English    Past Medical History: Past Medical History:  No past medical history on file      PAST SURGICAL HISTORY:  As above, plus multiple sinus surgeries and question of resection of  frontal lobe during one of those surgeries.  History of Present Illness: [name] Brown is a 47 y.o. unknown handed male who  presents to the hospital on 09/30/2013 with a large right frontal abscess with  history of multiple sinus surgeries and recent sinus infection per report.  Patient presented to [facility] today with complaints of AMS x 1 day. HCT  obtained shows a large right frontal abscess with ventricular effacement and 2  cm anterior leftward midline shift. The pt was transfered to [facility] on 09/30/13. He  underwent Reexploration of bicoronal exposure for craniotomy for evacuation of  right frontal intracerebral abscess by Dr. Madaline Savage on 09/30/13. As of  10/06/13 the plan of care per the medicine team:  Plan:    R frontal brain abscess, MSSA s/p craniotomy, removal of SDH. Will need ENT f/u.  Will continue to taper steroids. Na tablet taper    Headache- start oxycontin, prn fioricet    Uncontrolled DM- states he was on 2 po meds at home, continue lantus. Will start  Metformin and glipizide    OSA- will order home CPAP    ppx- Lovenox    Anticipated disc            Date of Onset: 09/30/13            Date of Admission: 10/07/2013 8:46:00 PM    Medications and Allergies: Significant rehabilitation considerations:   Allergic to morphine and penicillins  Rehabilitation Precautions/Restrictions:   Risk for falls, Aspiration/swallowing    SUBJECTIVE  Patient Reports: No, I don't need it ( refering to nutrition education )  Social  History:  Marital Status: married  Children: 1 dtr 1son          Reside:  both are out of the area.  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    OBJECTIVE  Labs:   Glucose in BMP and Serial Glucose Monitoring: 253   HbA1C: 9.8   BUN: 22  Weight: 260 pounds. 118.18 kilograms.  Height:  70 inches. 1.78 meters.  BMI: 37.3 kilogram per meters squared.  Weight Group:  Obese Class II  Admission Weight: 260    Usual Weight: UNknown pounds.  Ideal Body Weight: 166 pounds. 75.45 kilograms.  Percent Ideal Body Weight: Patient's current weight is 157 % of ideal body  weight.  Weight Change: Patient has had no weight change since admission.    Diet Intake Prior to Admission: Regular  Supplements Prior to Admission: None  Herbals/Vitamins Prior to Admission:  None    Food Allergies/Intolerances: No known food allergies.  Religious/Cultural Food Practices: None  Current Medical/Nutrition Therapy: Diet: 1800 Consistent Carbohydrate Calories.   diet with  thin liquids.    % of Meals Consumed: 75 % Greater than  Feeding Modality and Dentition: Oral, teeth intact  Current Problems/GI Symptoms: No Current GI Problems Noted.  Wounds/Incisions:  Surgical frontal head incision  Energy Needs Calculation: 1900-2200 cal    Estimated Nutrition and Fluids Needs:  1900-2200 calories (24-28 kcal/kg)  79-95 grams of Protein (1-1.2 grams/kg)  2765 ml fluids (35 ml/kg)    ASSESSMENT  Assessment of Nutritional Status:  Current Nutrition Intake: Adequate.  Current Nutrition Status: Moderately compromised.  Nutritional Risk Level:  Moderate  Nutrition Diagnosis:   NC-2.2 Altered Nutrition Related Laboratory Values: Related to non compliance  to diet as evidenced by HgbA1c of 9.8 .  Overall Assessment/Recommendations: >75% po intake;  refused nutrition education;  continue nutrition plan.    Barriers to Progress/Discharge: No potential barriers to progress.    Interventions Provided:   Assessment Completed.   Monitor Progress.  Pain Reassessment: Pain was not reassessed as no pain was reported.    Interdisciplinary Educational Needs and Learning Preferences:  Education not  assessed/provided this session. Pt refusededucation @ this time    Education Provided:  No education provided this session.    Long Term Goals:  Time frame to achieve long term goal(s): 3 weeks       1. Pt maintains 75% or mor epo intake for adequate nutrition  Short Term Goals:  Time frame to achieve short term goal(s): 1 visit       1. pt makes healthy food choices for better FBS control    PLAN  Recommendations for Follow-up: F/U @ 10/14/13  .75% po intake; refused nutrition education; continue nutrition plan.    Care Plan  Identified problems from team documentation:      Identified problems from this assessment:     No problems identified at this time.    Please review Integrated Patient View Care Plan Flowsheet for Team identified  Problems, Interventions, and Goals.    Signed by: Lurline Hare, M.S., RD 10/08/2013 10:56:00 AM

## 2013-10-08 NOTE — Rehab Progress Note ([facility]) (Signed)
NAMELAMARCO GUDIEL  MRN: [id number]  Account: [id number]  Session Start: 10/08/2013 4:15:00 PM  Session Stop: 10/08/2013 4:15:00 PM    Physical Therapy  Inpatient Rehabilitation Exception Note    Patient was unable to complete planned therapy session.  0 minutes of PT treatment this session count towards intensity and duration of  therapy requirement. Patient was not seen for the full scheduled time of PT  treatment this session secondary to:   S:  Pt refusing Rx 2/2 severe H/A  O:  Agreeable to cold wash cloth applied to forehead.  Nsg made aware  A:  Rx deferred 2/2 above  Will attempt to see patient tomorrow,  as scheduled    Signed by: Edrick Oh, PT 10/08/2013 4:15:00 PM

## 2013-10-08 NOTE — Rehab Evaluation ([facility]) (Signed)
[name] Brown  MRN: [id number]  Account: [id number]  Session Start: 10/07/2013 12:00:00 AM  Session Stop: 10/07/2013 12:00:00 AM    Rehabilitation Nursing  Inpatient Rehabilitation Admission Assessment    Rehab Diagnosis: Gait Abnormality  Demographics:            Age: 56Y            Gender: Male  Past Medical History: Past Medical History:  No past medical history on file      PAST SURGICAL HISTORY:  As above, plus multiple sinus surgeries and question of resection of  frontal lobe during one of those surgeries.  History of Infection:   None.  History of Present Illness: [name] Brown is a 47 y.o. unknown handed male who  presents to the hospital on 09/30/2013 with a large right frontal abscess with  history of multiple sinus surgeries and recent sinus infection per report.  Patient presented to [facility] today with complaints of AMS x 1 day. HCT  obtained shows a large right frontal abscess with ventricular effacement and 2  cm anterior leftward midline shift. The pt was transfered to [facility] on 09/30/13. He  underwent Reexploration of bicoronal exposure for craniotomy for evacuation of  right frontal intracerebral abscess by Dr. Madaline Savage on 09/30/13. As of  10/06/13 the plan of care per the medicine team:  Plan:    R frontal brain abscess, MSSA s/p craniotomy, removal of SDH. Will need ENT f/u.  Will continue to taper steroids. Na tablet taper    Headache- start oxycontin, prn fioricet    Uncontrolled DM- states he was on 2 po meds at home, continue lantus. Will start  Metformin and glipizide    OSA- will order home CPAP    ppx- Lovenox    Anticipated disc            Date of Onset: 09/30/13            Date of Admission: 10/07/2013 8:46:00 PM    Rehabilitation Precautions Restrictions:   Risk for falls, Aspiration/swallowing  Social History:  Marital Status: married  Children: 1 dtr 1son          Reside:  both are out of the area.  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 118.18 kilograms. Patient weighed using bed scale.  Height: 70 inches. 1.78 meters.  BMI: 37.3 kilogram per meters squared.    ORIENTATION  Instructions and information: Administered to: Patient and Spouse given  instructions and information on hospital orientation. Orientation was provided  for the following areas: Orientation checklist, Bed controls, Call light,  Chaplain, Daily routine, Meal times, Phone and phone numbers, Visiting hours  Primary Language: English  Armband: Correct armband in place.  Valuables/Personal Items:  Patient and family state that patient has valuables  and/or personal items present.  The following valuables and/or personal items are present: cell phone, clothing  The patient and family received information that facility is not responsible for  valuables brought in or acquired during hospital stay.  Understanding of Current Condition: A/O x3, understands information given but  slow to respond  Patient/Caregiver Goals:  Patient and family's functional goals for patient: "to  be able to care for self"    MEDICATIONS AND ALLERGIES  No B/P or Needle Sticks: Not applicable.  Personal Medications: Patient did not bring a personal supply of medications.  Vaccinations:  Influenza: Refused  IV Access: Patient has IV access.     Type: Double Lumen PICC  Location: [facility] Length of line:  Care Provided:   Flushed before and after medication with 10ml each of NS Date Inserted:  10/02/13  Dialysis Access: Patient does not have dialysis access.    Medication Allergies: Mophine, PCN  Food Allergies: No known food allergies  Other Allergies: None    Pain: Patient currently has pain.  Location: headache  Type: Acute  Quality: Aching.  Pain Scale: Numeric.  Patient reports a pain level of 6 out of 10.  Patient's acceptable level of pain 1 out of 10.   Interferes with sleep. physical activity.  Pain is alleviated by: Med  Pain  is exacerbated by: Patient medicated.  Pain Reassessment:  Response to Pain Intervention:  Post Intervention Pain Quality:  None.  Patient Reports Post Intervention Pain Level of: 0 out of 10  Pain Acceptable: Yes    Elopement Risk Level Assessment Tool  Patient Criteria: Patient is not capable of leaving the unit.  Assessment is not  applicable.      RISK ASSESSMENT FOR FALLS/INJURY    MENTAL STATUS CRITERIA:   10 - Impaired Judgement/impulsive.   MENTAL STATUS TOTAL: 10    AGE CRITERIA:   17 - < 27 years old  AGE TOTAL: 0    ELIMINATION CRITERIA:   3 - Toileting with Assistance.   ELIMINATION TOTAL: 3    HISTORY OF FALLS CRITERIA:   2 - Unknown History.  HISTORY OF FALLS TOTAL: 2    MEDICATIONS CRITERIA:   2 or more High Risk Medications (*see list below)   MEDICATIONS TOTAL: 2    PHYSICAL MOBILITY CRITERIA:   3 - Decreased balance reaction.   1 - Weakness/impaired physical mobility.   PHYSICAL MOBILITY TOTAL: 4    FALLS RISK ASSESSMENT TOTAL: 21    Patient's Fall Risk: TOTAL SCORE >10: High Risk    Falls Interventions: Clutter removed and clear path to BR.  Call bell, phone, glasses, etc within reach.  Hourly toileting/safety checks between 6am and 10pm, then every 2 hours.  Initiate Fall care plan and outcome.  Yellow "high risk" patient identification in place: wrist band, socks, chart  sticker, door sign.  Pt and family education.  Reoriented PRN.  Low bed with mat  Bed alarm      SEVERE SEPSIS SCREEN  INFECTION:  Patient has documented infection. Is on antibiotic therapy (not  prophylaxis).    SYSTEMATIC IMFLAMMATORY RESPONSE SYNDROME (SIRS):  Patient  has no indication of  systematic inflammatory response syndrome (SIRS). Negative Sepsis Screen.  If you are unable to assess a system's dysfunction because you do not have labs,  or the labs you have are not current (within 24 hours), call physician and  request and order for the lab tests needed.      Restraint Initial: Patient does not have any restraints at  this time.    NUTRITION/DIET  IRF-PAI Swallowing Status: Swallowing Status: Regular Food: solids and liquids  swallowed safely without supervision or modified food consistencies.  IRF-PAI Dehydration: Patient does not display clinical signs of dehydration.  Nutrition Screen: Patient's nutrition risk factors include: No risk factors  identified.  Diet: Type: Consistent carbohydrate.  Food Consistency: Regular.  Liquid Consistency: Thin.    PSYCHOSOCIAL  Psychosocial/Abuse Screen: No evidence of neglect/abuse.  Suicide Risk Screen: Patient does not have a primary or secondary behavioral  health diagnosis or complaint.  Current Alcohol Use:  Patient does not consume alcohol.  Current Tobacco Use:  No, patient does not use tobacco.  Current Drug Use: No, patient does not use recreational drugs.    REVIEW OF SYSTEMS  Eyes:   Right Eye:  Reactivity: Brisk  Sclera Color: Clear   Eye is not draining     Left Eye:  Reactivity: Brisk  Sclera Color: Clear   Eye is not draining  Ears: Bilateral Ears: Within normal limits.  Hearing/Communication Device: None.  Mouth: Gums: Moist. Pink.  Tongue: Pink  Teeth: Oral hygiene does not appear to be adequate.    NEURO  Orientation/Awareness: Alert and Oriented x3.   occasinally forgetful  Behavior: Cooperative.  Impulsive.  Speech: Clear.  Verbal.    CARDIOVASCULAR     Bilateral lower extremities  Nail Bed Color: Pink.   No edema or redness present.  Homan's Sign:   Negative bilateral lower extremities  Pulses:   Apical Pulse: Regular. Strong. Rate is 65 .   Patient does not have a pacemaker.   Patient does not have a defibrillator.    CARDIOPULMONARY  Lung Sounds:   Upper lobes. Clear.   Lower lobes. Clear.  Type of Respirations: Regular at rest.  Cough: No cough noted.  Respiratory Support: The patient does not require any respiratory support.  Respiratory Equipment: None. O2 sat 96%    INTEGUMENTARY  Skin:  Temperature: Warm  Turgor: Normal for age  Moisture: Dry  Color of skin:  Normal for Race/Ethnicity  Capillary Refill: Less than 3 seconds  Wound/Incisions:     Surgical Incision: Frontal head incision Length: 32 centimeter(s) with sutures.  No signs of infection.  Drainage: Incision without drainage.  Odor:  No  Incision Care: Per protocol.  Braden Scale for Predicting Pressure Sore Risk: Sensory Perception: No  impairment  Moisture: Rarely moist  Activity: Walks occasionally  Mobility: Slightly limited  Nutrition: Adequate  Friction and Shear: Potential problem  Braden Score: 19  Level of Risk: No risk (19-23). Will reassess every shift.    GENITOURINARY  Current Bladder Pattern: Continent  Color:  Yellow   Patient denies problems with urination and/or catheter.    Sexuality: The patient denies any concerns regarding sexuality.    GASTROINTESTINAL  Abdomen: Soft. Nontender. Obese.  Bowel Sounds:  Bowel sounds audible in all four quadrants.  Date of Last Bowel Movement:  10/07/13   No Problems/Complaints with Bowel Elimination Assessed.  The patient has normal bowel activity.  Bowel Movements: The patient has bowel movements every other day. Patient does  not use laxatives.    MUSCULOSKELETAL  Hand Dominance:  Right.  Upper Extremities  Impairments/Prosthesis/Devices: Generalized weakness  Lower Extremities  Impairments/Prosthesis/Devices: Gait abnromality and Generalized weakness    FUNCTIONAL MEASURES  Bladder Frequency/Number of Accidents (4 days prior to admission):  Bladder  accidents prior to admission:  0 Patient has not had an accident 4 days prior to  admission.  Bowel Frequency/Number of Accidents (4 days prior to admission):  Bowel  accidents prior to admission:  0 Patient has not had an accident, but used a  device/medication 4 days prior to admission requiring: Stool softner .    EATING: Eating Score = 7. Patient is completely independent for eating.  There  are no activity limitations.    GROOMING: Grooming Score = 4.  Patient requires minimal assistance  for  grooming,  requiring steadying for balance only.  Patient requires the following assistive device(s) No assistive devices were  required.    BATHING: Patient bathed in bed. Patient requires moderate assistance for  washing, rinsing, or drying the right arm. Patient requires moderate assistance  for washing, rinsing, or drying the left arm. Patient requires minimal  assistance for washing, rinsing, or drying the chest. Patient requires minimal  assistance for washing, rinsing, or drying the abdomen. Patient requires minimal  assistance for washing, rinsing, or drying the perineal area. Patient requires  moderate assistance for washing, rinsing, or drying the buttocks. Patient  requires moderate assistance for washing, rinsing, or drying the right upper  leg. Patient requires moderate assistance for washing, rinsing, or drying the  left upper leg. Patient requires moderate assistance for washing, rinsing, or  drying the right lower leg, including the foot. Patient requires moderate  assistance for washing, rinsing, or drying the left lower leg, including the  foot. Patient performs 0 -  24% of bathing tasks.  Bathing Score = 1, Total  Assistance.  Patient requires the following assistive device(s):    UPPER BODY DRESSING: Upper Body Dressing Score = 5. Patient is  supervision/set-up for upper body dressing, requiring: Gathering/setting out  clothes.  Patient requires the following assistive device(s):  No assistive devices were  required.    LOWER BODY DRESSING: Patient requires moderate assistance for donning and/or  doffing undergarment threading right leg. Patient requires moderate assistance  for donning and/or doffing undergarment threading left leg. Patient requires  minimal assistance for donning and/or doffing undergarment over hips and  adjusting fasteners. Patient requires moderate assistance for donning and/or  doffing pants/skirt threading right leg. Patient requires moderate assistance  for donning and/or doffing  pants/skirt threading left leg. Patient requires  minimal assistance for donning and/or doffing pants/skirt over hips and  adjusting fastener. Patient requires moderate assistance for donning and/or  doffing right sock. Patient requires moderate assistance for donning and/or  doffing left sock. Patient requires minimal assistance for donning and/or  doffing right shoe. Patient requires minimal assistance for donning and/or  doffing left shoe. Patient performs 60 % of lower body dressing tasks. Lower  Body Dressing Score = 3, Moderate Assistance.  Patient requires the following assistive device(s): No assistive devices were  required.    TOILETING: Toileting Score = 4.  Patient requires minimal assistance for  toileting, such as steadying for balance while cleansing or adjusting clothes.  Patient requires the following assistive device(s):  Grab bar.    BLADDER MANAGEMENT - LEVEL OF ASSIST: Bladder Score = 7. Patient is completely  independent for bladder management. There are no activity limitations.    BLADDER ACCIDENTS THIS SHIFT:  0 . Patient has not had an accident this  assessment and did not require medications or devices.    BOWEL MANAGEMENT - LEVEL OF ASSIST: Bowel Score = 7.  Patient is completely  independent for bowel management.  Patient did not have bowel movement. No  medication/intervention was provided.    BOWEL ACCIDENTS THIS SHIFT: 0 . Patient has not had an accident and did not  require medications or devices.    TRANSFERS BED/CHAIR/WHEELCHAIR: Bed/chair/wheelchair Transfer Score = 3.  Patient performs 50-74% of effort and requires moderate assistance (some  lifting)  for transferring to and from the bed/chair/wheelchair, including  assist lifting both legs.  Patient requires the following assistive device(s): Bed rails.  Elevated head of bed.    TRANSFER TOILET: Toilet Transfer Score = 3.  Patient performs 50-74% of effort  and requires moderate assistance (some lifting) for transferring to and  from the  toilet/commode.  Patient requires the following assistive device(s):  Grab bars.    COMPREHENSION: Both ( auditory and visual) modes of comprehension are used  equally. Patient does not comprehend complex/abstract information in their  primary language without assistance from a helper. Comprehension Score = 4,  Minimal Prompting. Patient comprehends basic daily needs or ideas 75-90% of the  time. Patient requires minimal/occasional prompting.  Patient has the following assistive device(s) or limitations: No assistive  devices were required.    EXPRESSION: Both ( vocal and non-vocal) modes of expression are used equally.  Expression Score = 6,  Modified Independent. Patient expresses complex/abstract  information in their primary language, requiring: Additional time.    SOCIAL INTERACTION: Social Interaction Score = 7, Independent. Patient is  completely independent for social interaction.  There are no activity  limitations.    PROBLEM SOLVING: Patient does not make appropriate decisions in order to solve  complex problems without assistance from a helper. Problem Solving Score = 5,  Supervision.  Patient makes appropriate decisions in order to solve routine  problems with directing only under stressful or unfamiliar conditions, but no  more than 10% of the time, for the following behavior(s): None    MEMORY: Memory Score = 4, Minimal Prompting. Patient recognizes and remembers  75-90% of the time. Patient requires minimal/occasional prompting for memory for  the following: Remembering immediate information/instructions (after minutes)    IRFPAI GOALS    Toileting: 5  Lower Body Dressing: 5  Bathing; 5    FUNCTION  Functional Screen: Patient's functional risk limitations include: Decreased use  of one or both lower extremities.  Requires assistance for ambulation.  Unable to perform Activities of Daily Living.  Generalized weakness. Therapy consults were ordered.  Discharge Planning Screen: Potential  discharge planning issues include: None  Social Services/Case Management consult was ordered.    Interdisciplinary Educational Needs and Learning Preferences:       Learning Preference: The patient's preferred learning method is:  Explanation.  The patient's preferred learning method is: Demonstration.  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       Barriers to Learning: No barriers.       Learning Needs: Precautions.  Pain management.  Plan of care.  Safety.  Skin/wound care.  Medical management.    Education Provided: Precautions. Pain management. Pain scale. Medication  options. Side effects. Plan of care. Safety issues and interventions. Fall  protocol. Impulsivity. Skin/wound care. Signs/symptoms of infection.       Audience: Patient.       Mode: Explanation.       Response: Verbalized understanding.  Needs reinforcement.    ASSESSMENT  Summary of Rehab Nursing-specific Deficits:   Pain (Acute, Chronic, Pain management)   Safety: Risk for fall   Self-care deficits (Feeding, Toileting, Bathing and Hygiene, Medication  management)   Skin Integrity  Rehab Potential: Good family/social support, Motivated  Barriers to Progress/Discharge: No potential barriers to progress.    Long Term Goals:   Not applicable.  Short Term Goals:  Time frame to achieve short term goal(s): 7 to 10 days from  10/07/13       1. Pt will consistently use call light for assistance prior to getting  OOB/toilet to prevent fall.       2. Pt will report pain control at/or<3/10 prior to  and during therapy  session with medications and alternative.       3. Pt will report any s/s  of incision/wound infection in order to receive  timely medical intervention.  Skin/wound, infection risk, fall risk, mobility, cognitive impairment, and  impaired strentgh    PLAN  Nursing-specific Interventions:   Medication Management:   Skin Management:   Diabetic Management:   Pain Management:   Wound Management:   Medical Condition(s)  Management in Collaboration with MD:    TEAM CARE PLAN  Identified problems from team documentation:      Identified problems from this assessment:     No problems identified at this time.    Please review Integrated Patient View Care Plan Flowsheet for Team identified  Problems, Interventions, and Goals.    Signed by: Jacqualyn Posey, RN 10/07/2013 10:45:00 PM

## 2013-10-08 NOTE — Rehab Evaluation ([facility]ilinks) (Addendum)
Corrected 10/08/2013 5:18:40 PM    NAME: [name] Brown  MRN: [id number]  Account: [id number]  Session Start: 10/08/2013 8:00:00 AM  Session Stop: 10/08/2013 9:00:00 AM    Occupational Therapy  Inpatient Rehabilitation Evaluation    Rehab Diagnosis: Gait Abnormality  Demographics:            Age: 47Y            Gender: Male  Primary Language: English    Past Medical History: Past Medical History:  No past medical history on file      PAST SURGICAL HISTORY:  As above, plus multiple sinus surgeries and question of resection of  frontal lobe during one of those surgeries.  History of Present Illness: [name] Brown is a 47 y.o. unknown handed male who  presents to the hospital on 09/30/2013 with a large right frontal abscess with  history of multiple sinus surgeries and recent sinus infection per report.  Patient presented to [facility] today with complaints of AMS x 1 day. HCT  obtained shows a large right frontal abscess with ventricular effacement and 2  cm anterior leftward midline shift. The pt was transfered to [facility] on 09/30/13. He  underwent Reexploration of bicoronal exposure for craniotomy for evacuation of  right frontal intracerebral abscess by Dr. Madaline Savage on 09/30/13. As of  10/06/13 the plan of care per the medicine team:  Plan:    R frontal brain abscess, MSSA s/p craniotomy, removal of SDH. Will need ENT f/u.  Will continue to taper steroids. Na tablet taper    Headache- start oxycontin, prn fioricet    Uncontrolled DM- states he was on 2 po meds at home, continue lantus. Will start  Metformin and glipizide    OSA- will order home CPAP    ppx- Lovenox    Anticipated disc   Date of Onset: 09/30/13   Date of Admission: 10/07/2013 8:46:00 PM  Premorbid Functional Level: per pt report, he was independent with his ADLs  prior to admission  Social/Educational History: Per patient report, he is married and has several  children. Pt. recently relocated [place] the [place] are from [place]. Pt reported  he will be  workig in [place] and plans on living in [place]. Pt works with  numbers.  Home Environment: Pt. is living in a hotel, as he just relocated here.    Medications and Allergies: Significant rehabilitation considerations:   Allergic to morphine and penicillins  Rehabilitation Precautions/Restrictions:   Risk for falls, Aspiration/swallowing    SUBJECTIVE  Patient Report: Pt received supine in bed. Pt stated he has a headache. Pt was  agreeable to OT session. Pt reported he did not want to get up due to his  headache, but later agreed to working with OT on edge of bed.  Patient/Caregiver Goals:  Patient's functional goals: "to be able to care for  self"  Pain: Patient currently has pain.  Location: head  Type: Acute  Quality: Sharp.  Pain Scale: Numeric.  Patient reports a pain level of 7 out of 10.  Patient's acceptable level of pain 4 out of 10.   Interferes with physical activity.  Pain is alleviated by: pain medication  Pain is exacerbated by: sitting up Patient medicated.  Pain Reassessment:  Response to Pain Intervention: pain remains high  Post Intervention Pain Quality:  Sharp.  Patient Reports Post Intervention Pain Level of: 7 out of 10  Pain Acceptable: No: pain medication and rest    OBJECTIVE  General Observation: OT orders received, and reviewed in Epics, notes reviewed  in Med[facility] prior to session. Pt presents with picc to right elbow. stitches  intact to his head, no drainage noted or signs of infection noted  Understanding of Current Condition: A/O x3, understands information given but  slow to respond    Cognition: Pt presents with OandA X 3- name place, situation. Pt attends to task  [facility], perseverates on pain and stated he doesn't want to do anything that causes  his pain to increase. Needs extra time to complete problem solving and cues for  safety  Vision:  WFL  Perception: WFL:pt was able to tell OTR about his sx    Upper Extremity Status   Tone: WFL   Range of Motion: WFL to bilateral UEs   Fine  Motor: WFL- pt anble to oppsose thumb to bilateral digits and button his  shirt   Other:    Strength/Motor Control: PT presents with decreased right UE strength, has  decreases strength and endurance. pt with good postural control, pt able to sit  EOB static and dynamic without asssistance  Sensation: per pt report, sensation was [facility] to bilateral UEs    Interventions: None provided today.    Interdisciplinary Educational Needs and Learning Preferences:       Learning Preference: The patient's preferred learning method is:  Explanation.  The patient's preferred learning method is: Demonstration.       Barriers to Learning: Cognitive limitations.       Learning Needs: Precautions.  Plan of care.  Rehabilitation techniques and procedures.  Functional activities/mobility.    Education Provided: Plan of care. Activities of daily living. Bed mobility.       Audience: Patient.       Mode: Explanation.  Demonstration.       Response: Needs practice.  Needs reinforcement.    ASSESSMENT  Summary of Deficits and Prognosis: PT is a 47 y/o RHD male who is s/p  craniotomy, s/p SDH. Pt presents with decreased motivation due to  pain/headaches. Also with decreased strength and endurace which impact his bed  mobility, functional mobility and ADLs. Pt can benefit from skilled OT  intervetion to address the above mentioned problems. pt is a good rehab  candidate. Cogntion and decreased motivation are barriers to learning  Rehab Potential: Able to participate in an intensive inpatient interdisciplinary  rehabilitation program  Barriers to Progress/Discharge: Architectural barriers in home, Poor activity  tolerance, Limited motivation    Functional Measures    EATING: Eating Score = 5. Patient is supervision/set-up for eating, requiring:  Stand by assistance.  Patient requires the following assistive device(s) No assistive devices were  required.    GROOMING: Grooming Score = 5. Patient is supervision/set-up for grooming,  requiring:  Setting out grooming equipment.  Patient requires the following assistive device(s) No assistive devices were  required.    BATHING: Bathing did not occur because patient refused.    UPPER BODY DRESSING: Upper Body Dressing Score = 5. Patient is  supervision/set-up for upper body dressing, requiring: Setting out upper body  dressing equipment.  Patient requires the following assistive device(s):  No assistive devices were  required.    LOWER BODY DRESSING: Lower Body Dressing Score = 4.  Patient requires minimal  assistance for lower body dressing, requiring steadying for balance only.  Patient requires the following assistive device(s): No assistive devices were  required.    TOILETING: Toileting did not occur because patient refused.  TRANSFERS BED/CHAIR/WHEELCHAIR: Bed/chair/wheelchair Transfer did not occur  because patient refused. .    TRANSFER TOILET: Toilet Transfer did not occur because patient refused. .    TRANSFER TUB: Tub Transfer did not occur because activity was unsafe for  patient.    TRANSFER SHOWER: Shower Transfer did not occur because patient refused.    IRFPAI Goals    Eating: 7  Grooming: 7  Bathing; 6  Upper Body Dressing: 7  Lower Body Dressing: 7  Toileting: 6  Bladder: 7  Bowel: 7  Bed, Chair, Wheelchair Transfers: 6  Mode of Bathing: S  Tub/Shower: 6  Toilet Transfers: 6    Short Term Goals:  Time frame to achieve short term goal(s):   in 3-5 days       1. Pt to complete LB dressing with set up       2. Pt to complete UB/LB bathing with min A       3. Pt to complete toileting task with supervision       4. pt to complete bed mobility with supervision  Long Term Goals:   Time frame to achieve long term goal(s):   in 5-10 days       1. pt to complete full am ADL routine( dressing/bathing), including  gathering ADL items with mod I       2. pt to complete toileting and toilet transfer with mod I       3. pt to complete all functional transfers with mod I    Risks/Benefits of Rehabilitation  Discussed with Patient/Caregiver: Yes.  Recommendations/Goals for Rehabilitation Discussed with Patient/Caregiver: Yes.    PLAN  Occupational Therapy Plan: Occupational Therapy is recommended.  Recommended Frequency/Duration/Intensity: pt to be seen in OT 5-6 days week for  60-120 min sessions individual or group settings  Activities Contributing Toward Care Plan: ADLs, functional transfers, strength,  endurance, cognitions, safety, energy conservation, pt/family education, AE/DME  needs/rec, d/c planning    Team Care Plan  Please review Integrated Patient View Care Plan Flowsheet for Team identified  Problems, Interventions, and Goals.    Identified problems from team documentation:  Problem: Impaired Mobility  Mobility: Primary Team Goal: Pt. will be able to transfer and ambulate >150 feet  with supervision to return to current living situation in hotel setting. [name]      Identified problems from this assessment:     Self Care Management : pt to complete his UB/LB dressing with mod I    Discipline:  Occupational Therapy    3 Hour Rule Minutes: 60 minutes of OT treatment this session count towards  intensity and duration of therapy requirement. Patient was seen for the full  scheduled time of OT treatment this session.    Signed by: Elliot Gurney, OT 10/08/2013 9:00:00 AM

## 2013-10-09 LAB — GLUCOSE WHOLE BLOOD - POCT
Whole Blood Glucose POCT: 190 mg/dL — ABNORMAL HIGH (ref 70–100)
Whole Blood Glucose POCT: 234 mg/dL — ABNORMAL HIGH (ref 70–100)
Whole Blood Glucose POCT: 182 mg/dL — ABNORMAL HIGH (ref 70–100)
Whole Blood Glucose POCT: 213 mg/dL — ABNORMAL HIGH (ref 70–100)

## 2013-10-09 LAB — URINALYSIS WITH MICROSCOPIC
Bilirubin, UA: NEGATIVE
Blood, UA: NEGATIVE
Glucose, UA: NEGATIVE
Ketones UA: NEGATIVE
Leukocyte Esterase, UA: NEGATIVE
Nitrite, UA: NEGATIVE
Protein, UR: NEGATIVE
Specific Gravity UA: 1.023 (ref 1.001–1.035)
Urine pH: 6 (ref 5.0–8.0)
Urobilinogen, UA: NEGATIVE mg/dL

## 2013-10-09 NOTE — Rehab Progress Note ([facility]ilinks) (Signed)
[name]  MRN: [id number]  Account: [id number]  Session Start: 10/08/2013 12:00:00 AM  Session Stop: 10/08/2013 12:00:00 AM    Rehabilitation Nursing  Inpatient Rehabilitation Shift Assessment    Rehab Diagnosis: Gait Abnormality  Demographics:            Age: 47Y            Gender: Male  Primary Language: English    Date of Onset:  09/30/13  Date of Admission: 10/07/2013 8:46:00 PM    Rehabilitation Precautions Restrictions:   Risk for falls, Aspiration/swallowing    Patient Report: I'm okay  Pain: Patient currently has pain.  Location: headache  Type: Acute  Quality: Aching.  Pain Scale: Numeric.  Patient reports a pain level of 8 out of 10.  Patient's acceptable level of pain 5 out of 10.   Interferes with physical activity.  Pain is alleviated by: pain medicine  Pain is exacerbated by: movement Patient medicated. Patient medicated.  Pain Reassessment:  Response to Pain Intervention: mild relief  Post Intervention Pain Quality:  None.  Patient Reports Post Intervention Pain Level of: 4 out of 10  Pain Acceptable: Yes  Patient/Caregiver Goals:  "to be able to care for self"    NEURO  Orientation/Awareness: Alert and Oriented x3.   slow to response, flat affect  Speech: Clear.  Behavior: Cooperative.  Impulsive.    MEDICATIONS  IV Access: Patient has IV access.     Type: Double Lumen PICC  Location: RUE Length of line:  Care Provided:   Flushed after medication with 5ml normal saline followed by 2.29ml (100u heparin  /ml). Using SASH method. Date Inserted:   10/02/13  Dialysis Access: Patient does not have dialysis access.      Elopement Risk Level Assessment Tool  Patient Criteria: Patient is not capable of leaving the unit.  Assessment is not  applicable.      RISK ASSESSMENT FOR FALLS/INJURY    MENTAL STATUS CRITERIA:   10 - Impaired Judgement/impulsive.   MENTAL STATUS TOTAL: 10    AGE CRITERIA:   16 - < 78 years old  AGE TOTAL: 0    ELIMINATION CRITERIA:   3 - Toileting with Assistance.   ELIMINATION  TOTAL: 3    HISTORY OF FALLS CRITERIA:   2 - Unknown History.  HISTORY OF FALLS TOTAL: 2    MEDICATIONS CRITERIA:   2 or more High Risk Medications (*see list below)   MEDICATIONS TOTAL: 2    PHYSICAL MOBILITY CRITERIA:   3 - Decreased balance reaction.   1 - Weakness/impaired physical mobility.   PHYSICAL MOBILITY TOTAL: 4    FALLS RISK ASSESSMENT TOTAL: 21    Patient's Fall Risk: TOTAL SCORE >10: High Risk    Falls Interventions: Clutter removed and clear path to BR.  Call bell, phone, glasses, etc within reach.  Hourly toileting/safety checks between 6am and 10pm, then every 2 hours.  Yellow "high risk" patient identification in place: wrist band, socks, chart  sticker, door sign.  Bed alarm    NUTRITION  Diet:  Type: Consistent carbohydrate.  Food Consistency: Regular.  Liquid Consistency: Thin.   1500 ml free water daily      CARDIOVASCULAR     Bilateral lower extremities  Nail Bed Color: Pink.   No edema or redness present.  Pulses:   Apical Pulse: Regular. Strong. Rate is .   Patient does not have a pacemaker.   Patient does not have a defibrillator.  CARDIOPULMONARY  Lung Sounds:   Upper lobes. Clear.   Lower lobes. Clear.  Type of Respirations: Regular.  Cough: No cough noted.  Respiratory Support: The patient does not require any respiratory support.  Respiratory Equipment: CPAP machine.    INTEGUMENTARY  Skin:  Temperature: Warm  Turgor: Normal for age  Moisture: Dry  Color of skin: Normal for Race/Ethnicity  Capillary Refill: Less than 3 seconds  Wounds/Incisions:     Surgical Incision: Cranial incision. Length: 32 centimeter(s) with sutures. No  signs of infection.  Drainage: Incision without drainage.  Odor:  No  Incision Care: Per protocol.  Braden Scale for Predicting Pressure Sore Risk: Sensory Perception: No  impairment  Moisture: Rarely moist  Activity: Walks occasionally  Mobility: Slightly limited  Nutrition: Adequate  Friction and Shear: Potential problem  Braden Score: 19  Level of Risk: No  risk (19-23). Will reassess every shift.    GASTROINTESTINAL  Abdomen: Soft.  Bowel Sounds:  Active bowel sounds audible in all four quadrants.  Date of Last Bowel Movement:  12/12   No Problems/Complaints with Bowel Elimination Assessed.    GENITOURINARY  Current Bladder Pattern: Continent  Color:  Yellow   Patient denies problems with urination and/or catheter.    MUSCULOSKELETAL  Upper Body: Generalized weakness  Lower Body: Gait abnromality and Generalized weakness    Functional Measures      BLADDER MANAGEMENT - LEVEL OF ASSIST: Bladder Score = 1. Patient performs less  than 25% of tasks and requires total assistance for bladder management. Helper  provides total assist to completely apply and remove brief.    BLADDER ACCIDENTS THIS SHIFT:  0 . Patient has not had an accident but used an  adult brief this assessment. 0    BOWEL MANAGEMENT - LEVEL OF ASSIST: Bowel Score = 6.  Patient is modified  independent for bowel management.  Patient did not have bowel movement.  Medication/intervention was provided.    BOWEL ACCIDENTS THIS SHIFT: 0 . Patient has not had an accident, but used a  stool softener.    Education Provided:    Education Provided: Precautions. Pain management. Plan of care.       Audience: Patient.       Mode: Explanation.       Response: Verbalized understanding.    Discharge: Patient is not being discharged at this time.    Long Term Goals:  Short Term Goals: Pt will consistently use call light for assistance prior to  getting OOB/toilet to prevent fall.  Pt will report pain control at/or<3/10 prior to  and during therapy session with  medications and alternative.  Pt will report any s/s of incision/wound infection in order to receive timely  medical intervention.  7 to 10 days from 10/07/13    PROGRESS TOWARD GOALS: Pt is slow to respond and needs frequent reminders and  cueing with ADLs.    PLAN: Nursing Specific Interventions  Diabetic Management. Medical Condition Management. Medication  Management. Pain  Management. Skin Management. Wound Management.  Continue with the current Nursing Plan of Care.    TEAM CARE PLAN  Identified problems from team documentation:  Problem: Impaired Mobility  Mobility: Primary Team Goal: Pt. will be able to transfer and ambulate >150 feet  with supervision to return to current living situation in hotel setting. [name]    Problem: Impaired Self-care Mgmt/ADL/IADL  Self Care: Primary Team Goal: pt to complete his UB/LB dressing with mod  I/Active    Add/Update Problems  from this assessment:  No updates at this time.    Please review Integrated Patient View Care Plan Flowsheet for Team identified  Problems, Interventions, and Goals.    Signed by: Jacinto Halim, RN 10/08/2013 11:00:00 PM

## 2013-10-09 NOTE — Rehab Progress Note ([facility]) (Signed)
NAMEDEMBA NIGH  MRN: [id number]  Account: [id number]  Session Start: 10/09/2013 12:00:00 AM  Session Stop: 10/09/2013 12:00:00 AM    SEVERE SEPSIS SCREEN  INFECTION:  Patient has documented infection. Is on antibiotic therapy (not  prophylaxis).    SYSTEMATIC IMFLAMMATORY RESPONSE SYNDROME (SIRS):  Patient  has no indication of  systematic inflammatory response syndrome (SIRS). Negative Sepsis Screen.  If you are unable to assess a system's dysfunction because you do not have labs,  or the labs you have are not current (within 24 hours), call physician and  request and order for the lab tests needed.    Signed by: Barrett Shell, RN 10/09/2013 10:00:00 AM

## 2013-10-09 NOTE — Rehab Progress Note ([facility]inks) (Signed)
[name] Brown  MRN: [id number]  Account: [id number]  Session Start: 10/09/2013 12:00:00 AM  Session Stop: 10/09/2013 12:00:00 AM    Rehabilitation Nursing  Inpatient Rehabilitation Shift Assessment    Rehab Diagnosis: Gait Abnormality  Demographics:            Age: 47Y            Gender: Male  Primary Language: English    Date of Onset:  09/30/13  Date of Admission: 10/07/2013 8:46:00 PM    Rehabilitation Precautions Restrictions:   Risk for falls, Aspiration/swallowing    Patient Report: I am fine  Pain: Patient currently has pain.  Location: Head  Type: Acute  Quality: Aching.  Pain Scale: Numeric.  Patient reports a pain level of 5 out of 10.  Patient's acceptable level of pain 0 out of 10.   Interferes with physical activity. sleep.  Pain is alleviated by: medictions  Pain is exacerbated by: Patient medicated.  Pain Reassessment: 30 min post medication  Patient/Caregiver Goals:  "to be able to care for self"    NEURO  Orientation/Awareness: Alert and Oriented x2.  Speech: Slurred.  Behavior: Uncooperative.    MEDICATIONS  IV Access: Patient has IV access.     Type: Double Lumen PICC  Location: RUE Length of line:  Care Provided:   Flushed after medication with 5ml normal saline followed by 2.25ml (100u heparin  /ml). Using SASH method. Date Inserted:  Dialysis Access: Patient does not have dialysis access.      Elopement Risk Level Assessment Tool  Patient Criteria: Patient is not capable of leaving the unit.  Assessment is not  applicable.      RISK ASSESSMENT FOR FALLS/INJURY    MENTAL STATUS CRITERIA:   10 - Impaired memory.   MENTAL STATUS TOTAL: 10    AGE CRITERIA:   48 - < 64 years old  AGE TOTAL: 0    ELIMINATION CRITERIA:   0 - None identified.  ELIMINATION TOTAL: 0    HISTORY OF FALLS CRITERIA:   2 - Unknown History.  HISTORY OF FALLS TOTAL: 2    MEDICATIONS CRITERIA:   2 or more High Risk Medications (*see list below)   MEDICATIONS TOTAL: 2    PHYSICAL MOBILITY CRITERIA:   3 - Decreased balance  reaction.   1 - Weakness/impaired physical mobility.   PHYSICAL MOBILITY TOTAL: 4    FALLS RISK ASSESSMENT TOTAL: 18    Patient's Fall Risk: TOTAL SCORE >10: High Risk    Falls Interventions: Clutter removed and clear path to BR.  Call bell, phone, glasses, etc within reach.  Hourly toileting/safety checks between 6am and 10pm, then every 2 hours.  Initiate Fall care plan and outcome.  Yellow "high risk" patient identification in place: wrist band, socks, chart  sticker, door sign.  Assistive devices at [facility].  Reoriented PRN.  Family at bedside.  Low bed with mat    NUTRITION  Diet:  Type: Regular.  Food Consistency: Regular.  Liquid Consistency: Thin.      CARDIOVASCULAR     Bilateral lower extremities  Nail Bed Color: Pink.   No edema or redness present.  Pulses:   All Pulses: Regular. Strong. Rate is .   Patient does not have a pacemaker.   Patient does not have a defibrillator.    CARDIOPULMONARY  Lung Sounds:  Type of Respirations: Regular.  Cough: No cough noted.  Respiratory Support: The patient does not require any respiratory support.  Respiratory  Equipment: CPAP machine.    INTEGUMENTARY  Skin:  Temperature: Warm  Turgor: Elastic  Moisture: Dry  Color of skin: Normal for Race/Ethnicity  Capillary Refill: Less than 3 seconds  Wounds/Incisions:     Surgical Incision: Cranial incision. Length: 32 centimeter(s) with sutures. No  signs of infection.  Drainage: Incision without drainage.  Odor:  No  Incision Care: Per protocol.  Braden Scale for Predicting Pressure Sore Risk: Sensory Perception: No  impairment  Moisture: Rarely moist  Activity: Walks occasionally  Mobility: Slightly limited  Nutrition: Adequate  Friction and Shear: No apparent problem  Braden Score: 20  Level of Risk: Moderate risk (13-14)   Assist patient to the bathroom every 2 hours    GASTROINTESTINAL  Abdomen: Distended. Soft. Obese.  Bowel Sounds:  Active bowel sounds audible in all four quadrants.  Date of Last Bowel Movement:  10/07/2013    No Problems/Complaints with Bowel Elimination Assessed.    GENITOURINARY  Current Bladder Pattern: Continent  Color:  Yellow Amber   Problems/Complaints with Urination: Retention.    MUSCULOSKELETAL  Upper Body: Generalized weakness  Lower Body: Gait abnromality and Generalized weakness    Functional Measures    EATING: Eating Score = 6. Patient is modified independent for eating, requiring:  More than reasonable time.    BLADDER MANAGEMENT - LEVEL OF ASSIST: Bladder Score = 5.  Patient is  supervision/set-up for bladder management, requiring: Verbal cuing, prompting,  or instructing.  Patient requires the following assistive device(s): Urinal.    BLADDER ACCIDENTS THIS SHIFT:  0 . Patient has not had an accident this  assessment and did not require medications or devices. Patient was not assessed.      Education Provided:    Education Provided: Pain management. Pain scale.       Audience: Patient.       Mode: Explanation.       Response: Verbalized understanding.    Discharge: Patient is not being discharged at this time.    Long Term Goals:  Short Term Goals: Pt will consistently use call light for assistance prior to  getting OOB/toilet to prevent fall.  Pt will report pain control at/or<3/10 prior to  and during therapy session with  medications and alternative.  Pt will report any s/s of incision/wound infection in order to receive timely  medical intervention.  7 to 10 days from 10/07/13    PROGRESS TOWARD GOALS:    PLAN: Nursing Specific Interventions  Diabetic Management. Medical Condition Management. Medication Management. Pain  Management. Skin Management. Wound Management.  Continue with the current Nursing Plan of Care.    TEAM CARE PLAN  Identified problems from team documentation:  Problem: Impaired Mobility  Mobility: Primary Team Goal: Pt. will be able to transfer and ambulate >150 feet  with supervision to return to current living situation in hotel setting. [name]    Problem: Impaired Self-care  Mgmt/ADL/IADL  Self Care: Primary Team Goal: pt to complete his UB/LB dressing with mod  I/Active    Add/Update Problems from this assessment:  No updates at this time.    Please review Integrated Patient View Care Plan Flowsheet for Team identified  Problems, Interventions, and Goals.    Signed by: Barrett Shell, RN 10/09/2013 10:00:00 AM

## 2013-10-09 NOTE — Progress Notes (Signed)
[facility] Physiatry Attending Face to Face Progress Note   Functional Status/Update:   I reviewed patient's therapy notes to assess functional status and ongoing need for therapies. Of note, refused to participate in therapy evals.  Mod assist with LB dressing.    Subjective:   Complains of continued severe headaches.  No other complaints.     Objective:   Filed Vitals:    10/08/13 2022 10/09/13 0126 10/09/13 0602 10/09/13 0603   BP: 114/77 128/71 118/71    Pulse: 63 68 57    Temp: 96.4 F (35.8 C) 97.9 F (36.6 C) 97 F (36.1 C)    TempSrc: Oral Oral Oral    Resp: 20  18    Weight:    117.6 kg (259 lb 4.2 oz)   SpO2: 97% 96% 98%        Physical Examination:   Appears well.  In no acute distress. Morbidly obese.   Conjunctivae non-injected.   Moist mucous membranes.   +S1S2 Heart rate and rhythm are regular. No significant lower limb edema.   Lungs are clear to auscultation bilaterally. No wheezes, rales, or rhonchi. Good respiratory effort.   Soft. Non-tender. Normoactive bowel sounds.   Alert Flat affect     New Labs:  Results     Procedure Component Value Units Date/Time    Urinalysis with microscopic [[id number]] Collected:10/09/13 0934    Specimen Information:Urine Updated:10/09/13 0934    Glucose Whole Blood - POCT [[id number]]  (Abnormal) Collected:10/09/13 0603     POCT - Glucose Whole blood 190 (H) mg/dL YQMVHQI:69/62/95 2841    Glucose Whole Blood - POCT [[id number]]  (Abnormal) Collected:10/08/13 2052     POCT - Glucose Whole blood 149 (H) mg/dL OZDGUYQ:03/47/42 5956    Glucose Whole Blood - POCT [[id number]]  (Abnormal) Collected:10/08/13 1623     POCT - Glucose Whole blood 232 (H) mg/dL RJJOACZ:66/06/30 1601    MRSA culture [[id number]] Collected:10/08/13 0722    Specimen Information:Body Fluid / Nares and Throat Updated:10/08/13 1548    Glucose Whole Blood - POCT [[id number]]  (Abnormal) Collected:10/08/13 1207     POCT - Glucose Whole blood 140 (H) mg/dL WCBJSEG:31/51/76 1607          Current medications:    Scheduled Meds:  Current Facility-Administered Medications   Medication Dose Route Frequency   . cefTRIAXone  2 g Intravenous Q12H SCH   . docusate sodium  100 mg Oral BID   . enoxaparin  40 mg Subcutaneous Daily   . famotidine  20 mg Oral Q12H SCH   . insulin glargine  35 Units Subcutaneous QAM   . metFORMIN  1,000 mg Oral BID Meals   . oxyCODONE  10 mg Oral Q8H SCH   . Senna  17.2 mg Oral QHS   . simvastatin  40 mg Oral Daily with dinner   . [DISCONTINUED] oxyCODONE  10 mg Oral Q12H SCH     PRN Meds:.acetaminophen, alum & mag hydroxide-simethicone, bisacodyl, butalbital-acetaminophen-caffeine, chlorproMAZINE, dextrose, dextrose, glucagon (rDNA), insulin aspart, ondansetron, oxyCODONE, oxyCODONE, saline, zolpidem, [DISCONTINUED] oxyCODONE, [DISCONTINUED] oxyCODONE    Assessment: 47 y.o. male with Brain abscess    Plan:   REHAB: Continue comprehensive and intensive inpatient rehab program, including:   Physical therapy 60-120 min daily, 5-6 times per week, Occupational therapy  60-120 min daily, 5-6 times per week, Speech therapy  60-120 min daily, 5-6 times per week, Case management and Rehabilitation nursing    Will continue to address the following impairments and issues:  Mobility,  ADLs and Cognitive impairments    - will ask rehab psych to see  - ID to consult during the week; ESR and CRP ordered for tomorrow  - continue high dose Rocephin for MSSA brain abscess  - try ice pack for pain in addition to opioid and Fioricet regimen

## 2013-10-09 NOTE — Rehab Progress Note ([facility]) (Signed)
NAMETORIE PRIEBE  MRN: [id number]  Account: [id number]  Session Start: 10/09/2013 12:00:00 AM  Session Stop: 10/09/2013 12:00:00 AM    Rehabilitation Nursing  Inpatient Rehabilitation Interaction Note    Rehab Diagnosis: Gait Abnormality  Demographics:            Age: 86Y            Gender: Male  Primary Language: English    Date of Onset:  09/30/13  Date of Admission: 10/07/2013 8:46:00 PM    Rehabilitation Precautions Restrictions:   Risk for falls, Aspiration/swallowing    Patient Report: No cathing, I will pee on my own    Education Provided:    Education Provided: Precautions. Plan of care. Bowel and bladder programs.  Intermittent catheterization. risks of urinary retention .       Audience: Patient.       Mode: Explanation.       Response: Verbalized understanding.    Interaction: Pt alert, delayed response, follows commands. Pt stated he needed  to urinate in the toilet. It took about 30 min for pt to get OOB, sit at the  edge of the bed and walked to the bathroom with a walker. When he got to the  bathroom, pt sat in the toilet with his pants on and didn't want to do anything.  He then stated that he felt dizzy and didn't want to urinate anymore. VS checked  and was WNL. Max assist with changing his clothing into a gown and pt walked  back to the bed with contact guard. Bladder scan showed , pt refused to be  cathed but risks are given to him and pt agreed to be cathed. When tech started  to insert the catheter, pt refused and said he will urinate in the urinal but pt  never did. Talked to the patient again that he needed to be cathed and educated  about risks of infection again and pt refused catherization completely and  stated that he will urinate on his own.    Signed by: Jacinto Halim, RN 10/09/2013 2:18:00 AM

## 2013-10-09 NOTE — Progress Notes (Signed)
Assumed care of patient from Thompson Caul, RN at 15:00.  Pt was resting comfortably in bed.  Reported pain at level 5 of 10, which he reported was acceptable.  Scheduled Oxycontin was administered, and pt refused PRN pain medicine.  BS at dinner was 182; 2 units of Novalog insulin was administered.  Pt's family brought in food for dinner.  PICC line x2 lumen to RUE was flushed with NS and had good blood return.  350 mL PO fluid intake from 15:00 to 17:00. Pt resting comfortably at end of shift.

## 2013-10-09 NOTE — Rehab Progress Note ([facility]) (Signed)
[name] Brown  MRN: [id number]  Account: [id number]  Session Start: 10/08/2013 12:00:00 AM  Session Stop: 10/08/2013 12:00:00 AM    SEVERE SEPSIS SCREEN  INFECTION:  Patient has documented infection. Is on antibiotic therapy (not  prophylaxis).    SYSTEMATIC IMFLAMMATORY RESPONSE SYNDROME (SIRS):  Patient  has white blood  count greater than or equal to 12000, less than or equal to 4000, or bands  greater than 10%. Negative Sepsis Screen.  If you are unable to assess a system's dysfunction because you do not have labs,  or the labs you have are not current (within 24 hours), call physician and  request and order for the lab tests needed.    Signed by: Jacinto Halim, RN 10/08/2013 8:00:00 PM

## 2013-10-09 NOTE — Progress Notes (Signed)
[name] Brown  MRN: [id number]  Account: [id number]  Session Start: 10/09/2013 12:00:00 AM  Session Stop: 10/09/2013 12:00:00 AM    Physical Medicine and Rehabilitation  Initial Individualized Interdisciplinary Plan Of Care    Rehab Diagnosis: Gait Abnormality  Demographics:            Age: 36Y            Gender: Male    Plan Of Care  Anticipated Discharge Date/Estimated Length of Stay: 10-14 days  Anticipated Discharge Destination: Community discharge with assistance  Discharge Plan : home  Other Team Recommendations: will need home IV antibiotics  Medical Necessity Expected Level Rationale: good potential to reach a  supervision level with mobility and self-care  Intensity and Duration: an average of 3 hours/5 days per week  Medical Supervision and 24 Hour Rehab Nursing: x  Physical Therapy: x  PT Intensity/Duration: 60-120 min daily, 5-6 times per week, approx 2 weeks  Occupational Therapy: x  OT Intensity/Duration: 60-120 min daily, 5-6 times per week, approx 2 weeks  Speech and Language Therapy: x  SLP Intensity/Duration: 60-120 min daily, 5-6 times per week, approx 2 weeks  Therapeutic Recreation: x  Psychology: x  Registered Dietician: x    The following is a list of patient problems that have been identified by the  interdisciplinary team:    Problem: Impaired Mobility  Team Identified Barrier to Discharge: Yes  Interventions:  Transfer training: Active  Gait training: Active  Mobility: Primary Team Goal/Status: Pt. will be able to transfer and ambulate  >150 feet with supervision to return to current living situation in hotel  setting.  / Active    Problem: Impaired Self-care Mgmt/ADL/IADL  Self Care: Primary Team Goal/Status: pt to complete his UB/LB dressing with mod  I / Active    Comments: Requires medical management for severe headaches and MSSA brain  abscess.    Signed by: Tama Gander, MD 10/09/2013 1:02:00 PM    Physician CoSigned By: Tama Gander 10/09/2013 13:02:34

## 2013-10-09 NOTE — Rehab Evaluation ([facility]) (Signed)
[name] Brown  MRN: [id number]  Account: [id number]  Session Start: 10/09/2013 1:05:00 PM  Session Stop: 10/09/2013 2:05:00 PM    Speech Language Pathology  Inpatient Rehabilitation Language Cognitive-Dysphagia Evaluation    Rehab Diagnosis: Gait Abnormality; cognitive-linguistic deficits after brain  abscess (frontal) with craniotomy  Demographics:            Age: 47Y            Gender: Male  Primary Language: [place]    Past Medical History: Past Medical History:  diabetes, multiple episodes of sinus infection and sinus surgery,  hyperlipidemia, and obstructive sleep apnea      PAST SURGICAL HISTORY:  As above, plus multiple sinus surgeries and question of resection of  frontal lobe during one of those surgeries.  History of Present Illness: [name] Brown is a 47 y.o. unknown handed male who  presents to the hospital on 09/30/2013 with a large right frontal abscess with  history of multiple sinus surgeries and recent sinus infection per report.  Patient presented to [facility] today with complaints of AMS x 1 day. HCT  obtained shows a large right frontal abscess with ventricular effacement and 2  cm anterior leftward midline shift. The pt was transfered to [facility] on 09/30/13. He  underwent Reexploration of bicoronal exposure for craniotomy for evacuation of  right frontal intracerebral abscess by Dr. Madaline Savage on 09/30/13. As of  10/06/13 the plan of care per the medicine team:  Plan:    R frontal brain abscess, MSSA s/p craniotomy, removal of SDH. Will need ENT f/u.  Will continue to taper steroids. Na tablet taper    Headache- start oxycontin, prn fioricet    Uncontrolled DM- states he was on 2 po meds at home, continue lantus. Will start  Metformin and glipizide    OSA- will order home CPAP    ppx- Lovenox    Anticipated disc   Date of Onset: 09/30/13   Date of Admission: 10/07/2013 8:46:00 PM  Premorbid Functional Level: Independent. 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 Family lives in hotel in [place]  with an offer on a 3 level townhouse pending.  Social/Educational History: Per patient report, he is married and has several  children. Pt. just moved here from [place]. 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 is living in a hotel, as he just relocated here.    Medications and Allergies: Significant rehabilitation considerations:   Allergic to morphine and penicillins  Rehabilitation Precautions/Restrictions:   Risk for falls, Aspiration/swallowing    SUBJECTIVE  Patient/Caregiver Goals:  Patient's functional goals: "to be able to care for  self"  Pain: Patient currently has pain.  Location: HA  Type: Chronic  Quality: Aching.  Pain Scale: Numeric.  Patient reports a pain level of 6 out of 10.  Patient's acceptable level of pain 0 out of 10.   Interferes with physical activity.  Pain is alleviated by: Meds given by RN  Pain is exacerbated by: Patient medicated. Oral; no difficulty swallowing.  Pain Reassessment:  Response to Pain Intervention: Improved pain but lethargy increased.  Post Intervention Pain Quality:  Aching.  Patient Reports Post Intervention Pain Level of: 4 out of 10  Pain Acceptable: Yes    OBJECTIVE  General Observation:  Pt lethargic but able and willing to participate; frequent  cues to maintain alertness and attention.  Understanding of Current Condition: A/O x3, understands information given but  slow to respond. requires repetition due to poor initiation.  Vision and Hearing: [facility]    Language/Cognitive Tests Administered: RIPA-2 portions; informal assessment.    Communication:   Oral Motor Exam:  SLow to respond but overall WFL. Speech is clear. Family and  pt report vocal quality back to B/L. Low volume at times which is more R/T cog  status.   Motor Speech: Slow movements due to decraesed initiation, but no  overt  asymmetry with adequate ROM. Speech is clear when enough volume produced. Pt  capabale of adequate volume but decreased initiation of this.   Auditory Comprehension:   Mod decreased processing for more complex information and multi-step commands.  Able to follow 1 and 2 step commands and answer basic y/n?s accurately, but  siginificant breakdown with increased complexity or abstract stimuli.   Reading Comprehension: Not tested.   Verbal Expression:  Pt able to express his wants and needs. No aphasia noted.  Speech is fluent and actually verbose at times. Verbal expression is functional  but lacks appropriate use due to cognitive deficits.   Pragmatic Language:  Mod-sev deficits with poor initiation, flat affect,  non-responsiveness , and inappropriate humor. Pt easily sistracted by family  members and displays reactions that indicate that he is "annoyed".   Written Expression:  Not tested.    Cognition:   Attention:       Mod-sev decreased attention; requires repetition and cues to  attend to SLP and not family. Lethargy interferes as well.   Processing: Moderate speecd and qulaity.   Behavior: Moderate: does not respond at times, expresses "annoyance" (mimicking  SLP towards end of session).   Memory:       Moderate: immediate and recent memory; poor recall for multi-step  directions despite repetition.   Problem Solving/Reasoning: mod-sev problem solving, organization and reasoning.  Lacks detail and accuracy.   Awareness/Judgment: mod-sev: doees not appear to have insight to deficits; no  self correction attempted and pt only aske for repetition x1.   Executive Functioning: Mod-sev. Marland Kitchen   [place] [employer] Scale*:  The patient?s cognitive functioning was assessed  using the [facility] Cognitive Scale. Score based on this assessment is:  Not Applicable.    Swallow:   Premorbid Swallow Function/Diet Texture: regular/thin   Current Diet: regular/thin (ADA)   Results of Previous Instrumental Study (if  applicable): none   Clinical Assessment: Pt previously had decreased swallow function in acute  setting but had been upgraded to regular diet with thin liquids. Today, pt  presnetd only with very mild oral dysphagia characterized by slow but effective  mastication. Pt able to transit A/P and elicit swallow response timely with  adequate laryngeal elevation on plapation and no s/s of aspiration and/or  penetration. Suspect delayed oral phase is more r/t cognitive deficits (poor  initiation) versus mechancis of swallow.   Recommended Diet: Regular diet. Thin liquids. SLP will monitor intermittently  to assure PO tolerance.   Swallow Precautions: Upright position, Small bite/sip    Interventions:    Interdisciplinary Educational Needs and Learning Preferences:       Learning Preference: The patient's preferred learning method is:  Explanation.       Barriers to Learning: Cognitive limitations.       Learning Needs: Plan of care.  Rehabilitation techniques and procedures.  Safety.  Communication/cognition.    Education Provided: Cognitive functioning.       Audience: Patient and significant other.       Mode: Explanation.       Response: Verbalized understanding.  Needs reinforcement.    ASSESSMENT  Summary of Deficits and Related Problems:  Pt presents with mod-severe cognitive  linguistic deficits in areas of complex attention, information processing,  memory. organization, and reasoning all of which impact functional problem  solving, ability to acquire and utilixe new information,  and executive  function. Pt noted to have a flat affect and lacks insight to deficits with no  evidence of self monitoring or self correction. deficits impact ability to  manage household and daily tasks effectively  as well as mdeications or safety  precautions. This is most impatced by poor initiation requiring reliance on  repetition from others as pt will not reposnd for an extended amount of time  unless prompted.  Skilled SLP  services are recommended to address above areas of deficit as pt was  independent and working for the [employer] as a Education officer, environmental prior to his  hospitalization.  Prognosis is considered good based upon recent onset, family  support and PLOF.  Rehab Potential: Good family/social support, Good premorbid functional status,  Living in the community premorbidly  Barriers to Progress/Discharge: Poor pain tolerance, Reduced insight    Functional Measures      COMPREHENSION: Auditory comprehension is the usual mode. Patient does not  comprehend complex/abstract information in their primary language without  assistance from a helper. Comprehension Score = 5, Supervision. Patient  comprehends basic daily needs or ideas greater than 90% of the time. Patient  requires stand by/rare prompting.  Patient has the following assistive device(s) or limitations: No assistive  devices were required., Auditory processing, Attention,  Initiation    EXPRESSION: Vocal expression is the usual mode. Patient does not express  complex/abstract information in their primary language without a helper.  Expression Score = 5, Stand By Prompting. Patient expresses basic daily needs or  ideas without prompting.  Patient has the following assistive device(s) or limitations: No assistive  devices were required    SOCIAL INTERACTION: Social Interaction Score = 3, Moderate Direction. Patient  interacts appropriately 50-74% of the time.  Patient requires moderate/some  direction for the following behavior(s): Initiation, Social behaviors,  Appropriateness    PROBLEM SOLVING: Patient does not make appropriate decisions in order to solve  complex problems without assistance from a helper. Problem Solving Score = 3,  Moderate Direction. Patient makes appropriate decisions in order to solve  routine problems 50-74% of the time. Patient requires moderate/some direction  for the following behavior(s): Attending to tasks, Processing information,  Initiating tasks,  Sequencing tasks, Recognizing presence or extent of  disability, Self correcting performance    MEMORY: Memory Score = 3, Moderate Prompting. Patient recognizes and remembers  50-74% of the time. Patient requires moderate/some prompting  for memory for the  following: Remembering immediate information/instructions (after minutes),  Remembering delayed information/instructions (after days), Recalling daily  routine, Using environmental cues    IRFPAI Goals    Mode of Comprehension: A  Comprehension: 5  Mode of Expression: V  Expression: 6  Problem Solving: 5  Social Interaction: 5  Memory: 6    Short Term Goals: Time frame to achieve short term goal(s):  One weeek from eval  on 10/09/2013       1. Pt will utilize memory log to recall daily schedule,  at elast 1 safety  precaution, and recall 1 tx activity per session with min cues/support.       2. Pt will improve attention, processing,  working memory in order to  complete simple functional problem solving tasks (simple self-cares, functional  reading, simple money management) with min-mod cues.       3. Pt will initiate task requested/presented with no more than 1 repetition  needed.  Long Term Goals:  Time frame to achieve long term goal(s):  3 weeks from eval on  10/09/13       1. Pt will utilize external memory aids to recall  daily schedule, tx  activities completed, precautions, medications including schedule, and goals  with supervision/occasional support.       2. Pt will use cognitive compensatory strategies to support attention,  memory and executive function in order to complete/direct routine self-care,  familiar home management and hierarchical cognitive tasks with  supervision/occasional assist.    Risks/Benefits of Rehabilitation Discussed with Patient/Caregiver: Yes.  Recommendations/Goals for Rehabilitation Discussed with Patient/Caregiver: Yes.    PLAN  Speech Pathology Plan: Speech Language Pathology is recommended to address:  Recommended  Frequency/Duration/Intensity: 1:1 and /or group skilled SLP services  for 60-120 minutes for three weeks  Activities Contributing Toward Care Plan: cognitive re-training with  hierarchical cognitive tasks and exercises, training in compensatory memory aids  and strategies, education on executive functions,  pt/family education, collaboration with team for d/c.    TEAM CARE PLAN  Please review Integrated Patient View Care Plan Flowsheet for Team identified  Problems, Interventions, and Goals.    Identified problems from team documentation:  Problem: Impaired Mobility  Mobility: Primary Team Goal: Pt. will be able to transfer and ambulate >150 feet  with supervision to return to current living situation in hotel setting. [name]    Problem: Impaired Self-care Mgmt/ADL/IADL  Self Care: Primary Team Goal: pt to complete his UB/LB dressing with mod  I/Active    Identified problems from this assessment:     Cognition : Pt will utilize external memory aids to follow daily schedule,  recall recent/ongoing events, precautions, medications  and learn compensatory  strategies with occasional cues/support.    Discipline:  SLP    3 Hour Rule Minutes: 60 minutes of SLP treatment this session count towards  intensity and duration of therapy requirement. Patient was seen for the full  scheduled time of SLP treatment this session.    * Original [place] [address] Cognitive Scale co-authored by Bonney Aid, Ph.D.,  Margot Chimes, M.A., Lanier Ensign, M.A., [facility], [place].  Revised 09/10/73 by Margot Chimes, M.A., and Vernie Shanks, O.T.R.    Signed by: Merton Border, CCC-SLP 10/09/2013 2:05:00 PM

## 2013-10-10 LAB — COMPREHENSIVE METABOLIC PANEL
ALT: 67 U/L — ABNORMAL HIGH (ref 0–55)
AST (SGOT): 35 U/L — ABNORMAL HIGH (ref 5–34)
Albumin/Globulin Ratio: 1.1 (ref 0.9–2.2)
Albumin: 3.5 g/dL (ref 3.5–5.0)
Alkaline Phosphatase: 80 U/L (ref 40–150)
BUN: 19 mg/dL (ref 7–21)
Bilirubin, Total: 0.5 mg/dL (ref 0.2–1.2)
CO2: 24 mEq/L (ref 22–29)
Calcium: 9.2 mg/dL (ref 8.5–10.5)
Chloride: 97 mEq/L — ABNORMAL LOW (ref 98–107)
Creatinine: 0.7 mg/dL (ref 0.7–1.3)
Globulin: 3.3 g/dL (ref 2.0–3.6)
Glucose: 231 mg/dL — ABNORMAL HIGH (ref 70–100)
Potassium: 4.5 mEq/L (ref 3.5–5.1)
Protein, Total: 6.8 g/dL (ref 6.0–8.3)
Sodium: 135 mEq/L — ABNORMAL LOW (ref 136–145)

## 2013-10-10 LAB — GFR: EGFR: >60

## 2013-10-10 LAB — CBC AND DIFFERENTIAL
Basophils Absolute Automated: 0.01 (ref 0.00–0.20)
Basophils Automated: 0 %
Eosinophils Absolute Automated: 0.21 (ref 0.00–0.70)
Eosinophils Automated: 2 %
Hematocrit: 42.8 % (ref 42.0–52.0)
Hgb: 14.6 g/dL (ref 13.0–17.0)
Immature Granulocytes Absolute: 0.05
Immature Granulocytes: 0 %
Lymphocytes Absolute Automated: 3.26 (ref 0.50–4.40)
Lymphocytes Automated: 26 %
MCH: 30.1 pg (ref 28.0–32.0)
MCHC: 34.1 g/dL (ref 32.0–36.0)
MCV: 88.2 fL (ref 80.0–100.0)
MPV: 10.9 fL (ref 9.4–12.3)
Monocytes Absolute Automated: 0.86 (ref 0.00–1.20)
Monocytes: 7 %
Neutrophils Absolute: 7.99 (ref 1.80–8.10)
Neutrophils: 65 %
Nucleated RBC: 0 (ref 0–1)
Platelets: 241 (ref 140–400)
RBC: 4.85 (ref 4.70–6.00)
RDW: 13 % (ref 12–15)
WBC: 12.33 — ABNORMAL HIGH (ref 3.50–10.80)

## 2013-10-10 LAB — C-REACTIVE PROTEIN: C-Reactive Protein: 5.6 mg/dL — ABNORMAL HIGH (ref 0.0–0.5)

## 2013-10-10 LAB — GLUCOSE WHOLE BLOOD - POCT
Whole Blood Glucose POCT: 189 mg/dL — ABNORMAL HIGH (ref 70–100)
Whole Blood Glucose POCT: 182 mg/dL — ABNORMAL HIGH (ref 70–100)
Whole Blood Glucose POCT: 179 mg/dL — ABNORMAL HIGH (ref 70–100)
Whole Blood Glucose POCT: 158 mg/dL — ABNORMAL HIGH (ref 70–100)

## 2013-10-10 LAB — SEDIMENTATION RATE: Sed Rate: 19 — ABNORMAL HIGH (ref 0–10)

## 2013-10-10 MED ORDER — GABAPENTIN 100 MG PO CAPS
100.0000 mg | ORAL_CAPSULE | Freq: Three times a day (TID) | ORAL | Status: DC
Start: 2013-10-10 — End: 2013-10-11
  Administered 2013-10-10 – 2013-10-11 (×4): 100 mg via ORAL
  Filled 2013-10-10 (×4): qty 1

## 2013-10-10 NOTE — Rehab Progress Note ([facility]) (Signed)
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  Location: insicion on the scalp  Type: Acute  Quality: Aching.  Pain Scale: Numeric.  Patient reports a pain level of 0 out of 10.  Patient's acceptable level of pain 0 out of 10.   Interferes with physical activity.  Pain is alleviated by: oral pain medication  Pain is exacerbated by: Mobility Patient medicated.  Pain Reassessment: Pain was not reassessed as no pain was reported.  Patient/Caregiver Goals:  "to be able to care for self"    NEURO  Orientation/Awareness: Alert and Oriented x3.  Speech: No deficits noted at this time.  Behavior: Cooperative.    MEDICATIONS  IV Access: No IV access.  Dialysis Access: Patient does not have dialysis access.      Elopement Risk Level Assessment Tool  Patient Criteria: Patient is not capable of leaving the unit.  Assessment is not  applicable.      RISK ASSESSMENT FOR FALLS/INJURY    MENTAL STATUS CRITERIA:   10 - Impaired memory.   MENTAL STATUS TOTAL: 10    AGE CRITERIA:   70 - < 39 years old  AGE TOTAL: 0    ELIMINATION CRITERIA:   3 - Toileting with Assistance.   ELIMINATION TOTAL: 3    HISTORY OF FALLS CRITERIA:   2 - Unknown History.  HISTORY OF FALLS TOTAL: 2    MEDICATIONS CRITERIA:   2 or more High Risk Medications (*see list below)   MEDICATIONS TOTAL: 2    PHYSICAL MOBILITY CRITERIA:   3 - Decreased balance reaction.   1 -  Weakness/impaired physical mobility.   PHYSICAL MOBILITY TOTAL: 4    FALLS RISK ASSESSMENT TOTAL: 21    Patient's Fall Risk: TOTAL SCORE >10: High Risk    Falls Interventions: Clutter removed and clear path to BR.  Call bell, phone, glasses, etc within reach.  Hourly toileting/safety checks between 6am and 10pm, then every 2 hours.  Yellow "high risk" patient identification in place: wrist band, socks, chart  sticker, door sign.  Pt and family education.  Assistive devices at [facility].  Reoriented PRN.  Pharmacy review of meds.  Bed alarm    NUTRITION  Diet:  Type: Consistent carbohydrate.  Food Consistency: Regular.  Liquid Consistency: Thin.      CARDIOVASCULAR     Bilateral lower extremities  Nail Bed Color: Pink.   No edema or redness present.  Pulses:   Apical Pulse: Regular. Strong. Rate is 80 .   Patient does not have a pacemaker.   Patient does not have a defibrillator.    CARDIOPULMONARY  Lung Sounds:   Upper lobes. Clear.   Lower lobes. Clear.  Type of Respirations: Regular.  Cough: No cough noted.  Respiratory Support: The patient does not require any respiratory support.  Respiratory Equipment:  None.    INTEGUMENTARY  Skin:  Temperature: Warm  Turgor: Normal for age  Moisture: Dry  Color of skin: Normal for Race/Ethnicity  Capillary Refill: Less than 3 seconds  Wounds/Incisions:     Surgical Incision: Cranial incision. Length: 32 centimeter(s) with sutures. No  signs of infection.  Drainage: Incision without drainage.  Odor:  No  Incision Care: Per protocol.  Braden Scale for Predicting Pressure Sore Risk: Sensory Perception: No  impairment  Moisture: Rarely moist  Activity: Chairfast  Mobility: Slightly limited  Nutrition: Adequate  Friction and Shear: Potential problem  Braden Score: 18  Level of Risk: At risk (15-18)   Pressure Relief every 30 minutes while in wheelchair   Turn patient every 2 hours    GASTROINTESTINAL  Abdomen: Obese.  Bowel Sounds:  Active bowel sounds audible in all four quadrants.  Date  of Last Bowel Movement:  12/13   No Problems/Complaints with Bowel Elimination Assessed.    GENITOURINARY  Current Bladder Pattern: Continent  Color:  Yellow   Patient denies problems with urination and/or catheter.    MUSCULOSKELETAL  Upper Body: Generalized weakness  Lower Body: Gait abnromality and Generalized weakness    Functional Measures    EATING: Eating Score = 6. Patient is modified independent for eating, requiring:  More than reasonable time.    BLADDER MANAGEMENT - LEVEL OF ASSIST: Bladder Score = 5.  Patient is  supervision/set-up for bladder management, requiring: Setting out equipment.  Emptying equipment.  Patient requires the following assistive device(s): Urinal.    BLADDER ACCIDENTS THIS SHIFT:  0 . Patient has not had an accident but used a  urinal this assessment.    BOWEL MANAGEMENT - LEVEL OF ASSIST: Bowel Score = 6.  Patient is modified  independent for bowel management.  Patient did not have bowel movement.  Medication/intervention was provided.    BOWEL ACCIDENTS THIS SHIFT: 0 . Patient has not had an accident, but used a  stool softener.    Education Provided:    Education Provided: Precautions. Pain management. Medication options. Side  effects. Clinical indicators of pain. Plan of care. Skin/wound care. Incision  care. Critical pressure areas. Prevention of skin breakdown. Medication. Name  and dosage. Administration. Purpose. Side Effects. Administration of Lovenox  injections.       Audience: Patient.       Mode: Explanation.  Demonstration.       Response: Applied knowledge.  Verbalized understanding.  Demonstrated skill.  Needs practice.  Needs reinforcement.    Discharge: Patient is not being discharged at this time.    Long Term Goals:  Short Term Goals: Pt will consistently use call light for assistance prior to  getting OOB/toilet to prevent fall.  Pt will report pain control at/or<3/10 prior to  and during therapy session with  medications and alternative.  Pt will report any  s/s of incision/wound infection in order to receive timely  medical intervention.  7 to 10 days from 10/07/13    PROGRESS TOWARD GOALS: Pt has complain of headache , took tylenol 650mg  and  Fioricet 2 tablets for headache pain relief. Incision site is OTA with sutures  no signs of infection. Needs more time to eat  has being alert and orientated X3  with needed a lilttle reminder for memory (slow retriving or poor memory)issue.  OBS.    PLAN: Nursing Specific Interventions  Diabetic Management. Medical Condition Management. Medication Management. Pain  Management. Skin Management. Wound Management.  Continue with the  current Nursing Plan of Care.    TEAM CARE PLAN  Identified problems from team documentation:  Problem: Impaired Cognition  Cognition: Primary Team Goal: Pt will utilize external memory aids to follow  daily schedule, recall recent/ongoing events, precautions, medications  and  learn compensatory strategies with occasional cues/support./    Problem: Impaired Mobility  Mobility: Primary Team Goal: Pt. will be able to transfer and ambulate >150 feet  with supervision to return to current living situation in hotel setting. [name]    Problem: Impaired Self-care Mgmt/ADL/IADL  Self Care: Primary Team Goal: pt to complete his UB/LB dressing with mod  I/Active    Add/Update Problems from this assessment:  No updates at this time.    Please review Integrated Patient View Care Plan Flowsheet for Team identified  Problems, Interventions, and Goals.    Signed by: Ginger Carne, RN 10/10/2013 9:30:00 AM

## 2013-10-10 NOTE — Rehab Progress Note ([facility]) (Signed)
[name] Brown  MRN: [id number]  Account: [id number]  Session Start: 10/10/2013 12:00:00 AM  Session Stop: 10/10/2013 12:00:00 AM    SEVERE SEPSIS SCREEN  INFECTION:  Patient has documented infection. Is on antibiotic therapy (not  prophylaxis).    SYSTEMATIC IMFLAMMATORY RESPONSE SYNDROME (SIRS):  Patient  has no indication of  systematic inflammatory response syndrome (SIRS). Negative Sepsis Screen.  If you are unable to assess a system's dysfunction because you do not have labs,  or the labs you have are not current (within 24 hours), call physician and  request and order for the lab tests needed.    Signed by: Ginger Carne, RN 10/10/2013 9:30:00 AM

## 2013-10-10 NOTE — Rehab Progress Note ([facility]) (Signed)
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    OBJECTIVE: seated BP104/77  Interventions:       Self Care/Home Management:  Pt completed seated oral hygiene and toilet  transfer during hour long session.  Pt with various delays to onset of  treatment, but did not appear to be doing so purposefully.  Pt required 9  minutes to initiate stand pivot toilet transfer.  Pt resistant to encouragement  or verbal cues to initiate movement (ie variations of counting to three). Pt  left on toilet in the care of his nurse due to time contraints.  Pt required SBA  for clothing mgmt and toilet transfer.    ASSESSMENT  Pt with poor processing and initiation.  These deficits are layered upon pain  which, per pt report, increases with movement and is a barrier to garnering  motivation for a task.  Family training will be necessary as it appears pt will  be unable to manage medical appointments, medication, finances, etc.  independently.    PLAN  Continued Occupational Therapy is recommended.  Recommended Frequency/Duration/Intensity: pt to be seen in OT 5-6 days week for  60-120 min sessions individual or group settings  Continued Activities Contributing Toward Care Plan: ADLs, functional transfers,  strength, endurance, cognitions, safety, energy conservation, pt/family  education, AE/DME  needs/rec, d/c planning    3 Hour Rule Minutes: 60 minutes of OT treatment this session count towards  intensity and duration of therapy requirement. Patient was seen for the full  scheduled time of OT treatment this session.    Signed by: Arvella Nigh, OTR/L 10/10/2013 2:00:00 PM

## 2013-10-10 NOTE — Rehab Progress Note ([facility]) (Signed)
NAMEBRAYLON Brown  MRN: [id number]  Account: [id number]  Session Start: 10/10/2013 12:00:00 AM  Session Stop: 10/10/2013 12:00:00 AM    SEVERE SEPSIS SCREEN  INFECTION:  Patient has documented infection. Is on antibiotic therapy (not  prophylaxis).    SYSTEMATIC IMFLAMMATORY RESPONSE SYNDROME (SIRS):  Patient  has no indication of  systematic inflammatory response syndrome (SIRS). Negative Sepsis Screen.  If you are unable to assess a system's dysfunction because you do not have labs,  or the labs you have are not current (within 24 hours), call physician and  request and order for the lab tests needed.    Signed by: Gretta Arab, RN 10/10/2013 8:30:00 PM

## 2013-10-10 NOTE — Rehab IRF Data Coll Rights Priv Act ([facility]) (Signed)
NAMEARMONDO Brown  MRN: [id number]  Account: [id number]  Session Start: 10/08/2013 12:00:00 AM  Session Stop: 10/08/2013 12:00:00 AM      IRFPPS Data Collection Rights and Rehab Privacy Act Notice: IRF-PAI Data  Collection Rights and Rehab Privacy Act Statement provided/given to patient and  family.    Signed by: Peggye Pitt, Rehab Tech 10/08/2013 8:00:00 AM

## 2013-10-10 NOTE — Rehab Progress Note ([facility]) (Signed)
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 I have a headache."  Pain: Patient currently has pain.  Patient reports a pain level of 6 out of 10. Patient medicated. Pt. given long  acting Oxycodone this morning and Tylenol.    OBJECTIVE  General Observations: Pt. received laying in bed. Requesting hair washing. RN  present to put dressing on head incision. Shampoo cap used on back off head,  avoiding wound.    Interventions:       Therapeutic Activities:  Pt. provided with shampoo cap and dependent on  use. Pt. requiring assist to don socks. Pt. transferred from sup>sit with Min A.  Pt. provided with 20x18 w/c for transport to therapy gym, due to poor activity  tolerance. Pt. transferred to w/c, ambulating 5 feet without assistive device  and CGA for stability. Pt. practiced gait training for 40 feet with RW and stand  by assist. Pt. with poor initiation and motivation, requiring excessive time to  initiate task, despite encouragement by therapist. Pt. with c/o HA, requesting  medication. Pt. returned to room and ambulated 5 feet with CGA to bed and  transferred from sitting EOB>sidelying with HOB raised with Min A.  Pain Reassessment:  Response to Pain Intervention: Increase in HA when ambulating.  Post Intervention Pain Quality:  Aching.  Patient Reports Post Intervention Pain Level of: 6 out of 10  Pain Acceptable: No: RN notified and pt. received Fioricet.    Previously Documented Mode of Locomotion at Discharge: Walk  PAI Expected Mode of Locomotion at Discharge  (Correction): No change to the  current documented expected, most frequently used mode of locomotion at  discharge  PAI Walk/Wheelchair (Correction):    LOCOMOTION WHEELCHAIR:   Wheelchair did not occur.    LOCOMOTION WALK:   Walk Distance Scale = 1.  Distance walked is less than 50 feet. Walk Score = 1.   Patient requires supervision or set up only. Patient walked a distance of 40  feet. Rolling walker. Pt. with decreased gait speed, decreased step length, and  wide base of support.      Education Provided:    Education Provided: Gait.       Audience: Patient.       Mode: Explanation.       Response: Demonstrated skill.  Needs reinforcement.    ASSESSMENT  Pt. with decreased motivation and initiation, requiring increased time to  complete all aspects of functional mobility. Pt. with improved participation  compared to initial evaluation, but continues to require excessive time and  encouragement.    PLAN  Continued Physical Therapy is recommended.  Recommended Frequency/Duration/Intensity: 60-120 minutes per day; 5-6 days per  week for 10-14 days from initial evaluation on 10/08/13  Activities Contributing Toward Care Plan: transfer training, gait training,  stair training, balance training, ther ex, endurance training, neuromuscular  reeducation, DME procurement as needed, d/c planning, family training.    3 Hour Rule Minutes: 60 minutes of PT treatment this session count  towards  intensity and duration of therapy requirement. Patient was seen for the full  scheduled time of PT treatment this session.    Signed by: Chauncey Cruel, PT 10/10/2013 11:00:00 AM

## 2013-10-10 NOTE — Rehab Progress Note ([facility]) (Signed)
NAMERANVEER WAHLSTROM  MRN: [id number]  Account: [id number]  Session Start: 10/09/2013 12:00:00 AM  Session Stop: 10/09/2013 12:00:00 AM    SEVERE SEPSIS SCREEN  INFECTION:  Patient has documented infection. Is on antibiotic therapy (not  prophylaxis).    SYSTEMATIC IMFLAMMATORY RESPONSE SYNDROME (SIRS):  Patient  has no indication of  systematic inflammatory response syndrome (SIRS). Negative Sepsis Screen.  If you are unable to assess a system's dysfunction because you do not have labs,  or the labs you have are not current (within 24 hours), call physician and  request and order for the lab tests needed.    Signed by: Jacinto Halim, RN 10/09/2013 8:00:00 PM

## 2013-10-10 NOTE — Rehab Progress Note ([facility]) (Signed)
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  Location: head  Type: Acute  Quality: Aching.  Pain Scale: Numeric.  Patient reports a pain level of 5 out of 10.  Patient's acceptable level of pain 1 out of 10.   Interferes with physical activity.  Pain is alleviated by: pain medicine  Pain is exacerbated by: movement Patient medicated.  Pain Reassessment:  Response to Pain Intervention: helped relieved pain  Post Intervention Pain Quality:  None.  Patient Reports Post Intervention Pain Level of: 1 out of 10  Pain Acceptable: Yes  Patient/Caregiver Goals:  "to be able to care for self"    NEURO  Orientation/Awareness: Alert and Oriented x3.   slow to response, flat affect  Speech: Clear.  Behavior: Cooperative.  Impulsive.  Poor Judgment.    MEDICATIONS  IV Access: Patient has IV access.     Type: Double Lumen PICC  Location: RUE Length of line:  Care Provided:   Flushed after medication with 5ml normal saline followed by 2.1ml (100u heparin  /ml). Using SASH method. Date Inserted:   10/02/13  Dialysis Access: Patient does not have dialysis access.      Elopement Risk Level Assessment Tool  Patient Criteria: Patient is not capable of leaving the unit.  Assessment is not  applicable.      RISK ASSESSMENT FOR FALLS/INJURY    MENTAL STATUS CRITERIA:   10 - Impaired Judgement/impulsive.   MENTAL STATUS TOTAL: 10    AGE CRITERIA:   65 - < 93 years old  AGE TOTAL:  0    ELIMINATION CRITERIA:   3 - Toileting with Assistance.   ELIMINATION TOTAL: 3    HISTORY OF FALLS CRITERIA:   2 - Unknown History.  HISTORY OF FALLS TOTAL: 2    MEDICATIONS CRITERIA:   2 or more High Risk Medications (*see list below)   MEDICATIONS TOTAL: 2    PHYSICAL MOBILITY CRITERIA:   3 - Decreased balance reaction.   PHYSICAL MOBILITY TOTAL: 3    FALLS RISK ASSESSMENT TOTAL: 20    Patient's Fall Risk: TOTAL SCORE >10: High Risk    Falls Interventions: Clutter removed and clear path to BR.  Call bell, phone, glasses, etc within reach.  Hourly toileting/safety checks between 6am and 10pm, then every 2 hours.  Bed alarm    NUTRITION  Diet:  Type: Consistent carbohydrate. Regular.  Food Consistency: Regular.  Liquid Consistency: Thin.      CARDIOVASCULAR     Bilateral lower extremities  Nail Bed Color: Pink.   No edema or redness present.  Pulses:   Apical Pulse: Regular. Strong. Rate is .   Patient does not have a pacemaker.   Patient does not have a defibrillator.    CARDIOPULMONARY  Lung Sounds:   Upper lobes. Clear.   Lower lobes. Clear.  Type  of Respirations: Regular.  Cough: No cough noted.  Respiratory Support: The patient does not require any respiratory support.  Respiratory Equipment: CPAP machine. cpap at hs    INTEGUMENTARY  Skin:  Temperature: Warm  Turgor: Normal for age  Moisture: Dry  Color of skin: Normal for Race/Ethnicity  Capillary Refill: Less than 3 seconds  Wounds/Incisions:     Surgical Incision: Cranial incision. Length: 32 centimeter(s) with sutures. No  signs of infection.  Drainage: Incision without drainage.  Odor:  No  Incision Care: Incision healing  Braden Scale for Predicting Pressure Sore Risk: Sensory Perception: No  impairment  Moisture: Rarely moist  Activity: Walks occasionally  Mobility: Slightly limited  Nutrition: Adequate  Friction and Shear: Potential problem  Braden Score: 19  Level of Risk: No risk (19-23). Will reassess every shift.    GASTROINTESTINAL  Abdomen:  Soft.  Bowel Sounds:  Active bowel sounds audible in all four quadrants.  Date of Last Bowel Movement:  10/08/13   No Problems/Complaints with Bowel Elimination Assessed.    GENITOURINARY  Current Bladder Pattern: Continent  Color:  Yellow   Patient denies problems with urination and/or catheter.    MUSCULOSKELETAL  Upper Body: Generalized weakness  Lower Body: Gait abnromality and Generalized weakness    Functional Measures      BLADDER MANAGEMENT - LEVEL OF ASSIST: Bladder Score = 5.  Patient is  supervision/set-up for bladder management, requiring: Verbal cuing, prompting,  or instructing.  Emptying equipment.  Patient requires the following assistive device(s): Urinal.    BLADDER ACCIDENTS THIS SHIFT:  0 . Patient has not had an accident but used a  urinal this assessment.    BOWEL MANAGEMENT - LEVEL OF ASSIST: Bowel Score = 6.  Patient is modified  independent for bowel management.  Patient did not have bowel movement.  Medication/intervention was provided.    BOWEL ACCIDENTS THIS SHIFT: 0 . Patient has not had an accident, but used a  stool softener.    Education Provided:    Education Provided: Precautions. Pain management. Pain scale. Medication  options. Side effects. Safety.       Audience: Patient.       Mode: Explanation.       Response: Applied knowledge.  Verbalized understanding.  Demonstrated skill.    Discharge: Patient is not being discharged at this time.    Long Term Goals:  Short Term Goals: Pt will consistently use call light for assistance prior to  getting OOB/toilet to prevent fall.  Pt will report pain control at/or<3/10 prior to  and during therapy session with  medications and alternative.  Pt will report any s/s of incision/wound infection in order to receive timely  medical intervention.  7 to 10 days from 10/07/13    PROGRESS TOWARD GOALS: Pt is slow to respond. Pt takes frequent breaks and takes  time when getting oob to ambulate from the bed to the bathroom with walker. Pt  stated,  "Thank you for not rushing me." Noted to have mood lability.    PLAN: Nursing Specific Interventions  Diabetic Management. Medical Condition Management. Medication Management. Pain  Management. Skin Management. Wound Management.  Continue with the current Nursing Plan of Care.    TEAM CARE PLAN  Identified problems from team documentation:  Problem: Impaired Cognition  Cognition: Primary Team Goal: Pt will utilize external memory aids to follow  daily schedule, recall recent/ongoing events, precautions, medications  and  learn compensatory strategies with occasional cues/support./    Problem:  Impaired Mobility  Mobility: Primary Team Goal: Pt. will be able to transfer and ambulate >150 feet  with supervision to return to current living situation in hotel setting. [name]    Problem: Impaired Self-care Mgmt/ADL/IADL  Self Care: Primary Team Goal: pt to complete his UB/LB dressing with mod  I/Active    Add/Update Problems from this assessment:  No updates at this time.    Please review Integrated Patient View Care Plan Flowsheet for Team identified  Problems, Interventions, and Goals.    Signed by: Jacinto Halim, RN 10/09/2013 10:00:00 PM

## 2013-10-10 NOTE — Rehab Progress Note ([facility]) (Signed)
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    OBJECTIVE  General Observations: Awake and cooperative. Flat affect with slow processing  and poor initiation.    Interventions:       Speech Treatment: Transfer bed-w/c: SBA for mobility, Mod cues for  initiation/sequencing.  Set up with log book / memory book: Mod cues overall to generate and syntehsize  rehab related information. Frequent distratction of cell phone - poor awareness  (or indifference) to socially appropriate response. Set h/w of discussing d/c  goals with therapists.  Pain Reassessment: Pain was not reassessed as no pain was reported.    Education Provided:    Education Provided: Cognitive functioning.       Audience: Patient.       Mode: Explanation.       Response: Verbalized understanding.  Needs reinforcement.    ASSESSMENT  Pt presents with mod-sevre cognitive deficits c/b markedly depressed initiation  and slow processing / attention deficits. Pt appears very flat, and  demonstrated periods of decreased social prgamatic skills today (eg phone use).    PLAN  Continued Speech Language Pathology is recommended to address:  Recommended Frequency/Duration/Intensity: 1:1 and /or group skilled SLP services  for 60-120 minutes for three weeks  Continued Activities Contributing Toward Care Plan: cognitive re-training with  hierarchical cognitive tasks and exercises, training in compensatory memory  aids  and strategies, education on executive functions,  pt/family education, collaboration with team for d/c.    3 Hour Rule Minutes: 60 minutes of SLP treatment this session count towards  intensity and duration of therapy requirement. Patient was seen for the full  scheduled time of SLP treatment this session.    Signed by: Lawson Fiscal, M.S., CCC/SLP 10/10/2013 12:00:00 PM

## 2013-10-10 NOTE — Progress Notes (Signed)
Full to follow. MSSA brain abscess. Keep CTx.

## 2013-10-10 NOTE — Progress Notes (Signed)
[facility] Physiatry Attending Brief Note  [x] No new events    Subjective:  Complains of continued unchanged headache.  Reports taking gabapentin previously for headache at home.    Objective:  Vitals: BP 113/76  Pulse 60  Temp 96.8 F (36 C) (Oral)  Resp 18  Wt 117.6 kg (259 lb 4.2 oz)  SpO2 97%    Appears well.  In no apparent distress.  Sclerae anicteric.   Moist mucous membranes.  No significant lower limb edema.  Blunted affect    New labs     Results     Procedure Component Value Units Date/Time    CBC and differential [[id number]]  (Abnormal) Collected:10/10/13 1408    Specimen Information:Blood / Blood Updated:10/10/13 1442     WBC 12.33 (H)      RBC 4.85      Hgb 14.6 g/dL      Hematocrit 95.2 %      MCV 88.2 fL      MCH 30.1 pg      MCHC 34.1 g/dL      RDW 13 %      Platelets 241      MPV 10.9 fL      Neutrophils 65 %      Lymphocytes Automated 26 %      Monocytes 7 %      Eosinophils Automated 2 %      Basophils Automated 0 %      Immature Granulocyte 0 %      Nucleated RBC 0      Neutrophils Absolute 7.99      Abs Lymph Automated 3.26      Abs Mono Automated 0.86      Abs Eos Automated 0.21      Absolute Baso Automated 0.01      Absolute Immature Granulocyte 0.05     Comprehensive metabolic panel [[id number]] Collected:10/10/13 1408    Specimen Information:Blood Updated:10/10/13 1426    Narrative:    Odette Horns is a nurse draw per sheet. 10/10/2013  08:07  Strange, Klark is a nurse draw per sheet. 10/10/2013  08:07    Sedimentation rate (ESR) [[id number]] Collected:10/10/13 1408    Specimen Information:Blood Updated:10/10/13 1426    C Reactive Protein [[id number]] Collected:10/10/13 1408    Specimen Information:Blood Updated:10/10/13 1426    Narrative:    Odette Horns is a nurse draw per sheet. 10/10/2013  08:07  Conry, Irvin is a nurse draw per sheet. 10/10/2013  08:07    Glucose Whole Blood - POCT [[id number]]  (Abnormal) Collected:10/10/13 1205     POCT - Glucose Whole blood 182 (H) mg/dL PIRJJOA:41/66/06  3016    Urine culture [[id number]] Collected:10/09/13 0932    Specimen Information:Urine / Urine, Clean Catch Updated:10/10/13 1117    Narrative:    ORDER#: [id number]                                    ORDERED BY: Tama Gander  SOURCE: Urine, Clean Catch                           COLLECTED:  10/09/13 09:32  ANTIBIOTICS AT COLL.:                                RECEIVED :  10/09/13 13:57  Culture Urine  FINAL       10/10/13 11:17  10/10/13   No growth of >1,000 CFU/ML, No further work      Glucose Whole Blood - POCT [[id number]]  (Abnormal) Collected:10/10/13 0537     POCT - Glucose Whole blood 189 (H) mg/dL WUJWJXB:14/78/29 5621    Glucose Whole Blood - POCT [[id number]]  (Abnormal) Collected:10/09/13 2108     POCT - Glucose Whole blood 213 (H) mg/dL NGEXBMW:41/32/44 0102    MRSA culture [[id number]] Collected:10/08/13 0722    Specimen Information:Body Fluid / Nasal/Throat ASC Admission Updated:10/09/13 1732    Narrative:    ORDER#: [id number]                                    ORDERED BY: Tama Gander  SOURCE: Nares and Throat                             COLLECTED:  10/08/13 07:22  ANTIBIOTICS AT COLL.:                                RECEIVED :  10/08/13 15:48  Culture MRSA Surveillance                  FINAL       10/09/13 17:32  10/09/13   No Methicillin resistant Staph aureus isolated.      Glucose Whole Blood - POCT [[id number]]  (Abnormal) Collected:10/09/13 1626     POCT - Glucose Whole blood 182 (H) mg/dL RJJOACZ:66/06/30 1601            Assessment/Plan: 47 y.o. male with Brain abscess    Continue comprehensive intensive inpatient rehab program.  Continue Ceftriaxone.  ID to consult.  Trial gabapentin for headache prophylaxis.

## 2013-10-11 LAB — GLUCOSE WHOLE BLOOD - POCT
Whole Blood Glucose POCT: 148 mg/dL — ABNORMAL HIGH (ref 70–100)
Whole Blood Glucose POCT: 198 mg/dL — ABNORMAL HIGH (ref 70–100)
Whole Blood Glucose POCT: 184 mg/dL — ABNORMAL HIGH (ref 70–100)
Whole Blood Glucose POCT: 183 mg/dL — ABNORMAL HIGH (ref 70–100)

## 2013-10-11 MED ORDER — POLYETHYLENE GLYCOL 3350 17 G PO PACK
17.0000 g | PACK | Freq: Every day | ORAL | Status: DC
Start: 2013-10-11 — End: 2013-10-18
  Administered 2013-10-11 – 2013-10-18 (×8): 17 g via ORAL
  Filled 2013-10-11 (×8): qty 1

## 2013-10-11 MED ORDER — GABAPENTIN 100 MG PO CAPS
200.0000 mg | ORAL_CAPSULE | Freq: Three times a day (TID) | ORAL | Status: DC
Start: 2013-10-11 — End: 2013-10-17
  Administered 2013-10-11 – 2013-10-17 (×17): 200 mg via ORAL
  Filled 2013-10-11 (×17): qty 2

## 2013-10-11 NOTE — Rehab Progress Note ([facility]) (Signed)
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    OBJECTIVE  General Observation: Seated in w/c on arrival.    Interventions:       Self Care/Home Management:  Pt engaged in functional grooming task from w/c  level at sink for completing shaving. Requires cueing to initiate task and  request needed materials, however completes with supervision and increased time  with no physical assistance. Pt is insistent upon independence with this task.  Pain Reassessment: Pain was not reassessed as no pain was reported.    Education Provided:    Education Provided: Activities of daily living.       Audience: Patient.       Mode: Explanation.  Demonstration.       Response: Demonstrated skill.  Needs practice.    ASSESSMENT  Pt demonstrates good endurance and sustained attention for functional rote task.  Requires increased time and cueing for initiating and requesting needed  materials, however remains on task and sequences with min VC's to resume with  mild perseveration noted.    PLAN  Continued Occupational Therapy is recommended.  Recommended Frequency/Duration/Intensity: pt to be seen in OT 5-6 days week for  60-120 min sessions individual or group settings  Continued Activities Contributing Toward Care Plan: ADLs, functional transfers,  strength, endurance, cognitions, safety, energy conservation, pt/family  education, AE/DME needs/rec, d/c planning    3 Hour Rule Minutes: 45  minutes of OT treatment this session count towards  intensity and duration of therapy requirement. Patient was seen for the full  scheduled time of OT treatment this session.    Signed by: Bobbye Morton, OTR/L 10/11/2013 4:00:00 PM

## 2013-10-11 NOTE — Rehab Progress Note ([facility]) (Signed)
[name] Brown  MRN: [id number]  Account: [id number]  Session Start: 10/11/2013 12:00:00 AM  Session Stop: 10/11/2013 12:00:00 AM    SEVERE SEPSIS SCREEN  INFECTION:  Patient has documented infection. Is on antibiotic therapy (not  prophylaxis). Is on antibiotic therapy (not prophylaxis).    SYSTEMATIC IMFLAMMATORY RESPONSE SYNDROME (SIRS):  Patient  has no indication of  systematic inflammatory response syndrome (SIRS). Negative Sepsis Screen.  If you are unable to assess a system's dysfunction because you do not have labs,  or the labs you have are not current (within 24 hours), call physician and  request and order for the lab tests needed.    Signed by: Margrett Rud, RN 10/11/2013 9:47:00 AM

## 2013-10-11 NOTE — Progress Notes (Signed)
[facility] Physiatry Attending Face to Face Progress Note   Functional Status/Update:   I reviewed patient's therapy notes to assess functional status and ongoing need for therapies. Of note, SBA with clothing management and toileting.    Subjective:   Complains of headache, although a little better. No other complaints.     Objective:   Filed Vitals:    10/09/13 2002 10/10/13 0611 10/10/13 1940 10/11/13 0525   BP: 111/61 113/76 133/63 119/71   Pulse: 73 60 71 63   Temp: 98.2 F (36.8 C) 96.8 F (36 C) 97 F (36.1 C) 96.3 F (35.7 C)   TempSrc: Oral Oral Oral Oral   Resp: 17 18 20 20    Weight:       SpO2: 97% 97% 97% 98%       Physical Examination:   Appears well.  In no acute distress. Obese.   Anicteric sclerae. Conjunctivae non-injected.   Moist mucous membranes.   +S1S2 Heart rate and rhythm are regular. No significant lower limb edema.   Lungs are clear to auscultation bilaterally. No wheezes, rales, or rhonchi. Good respiratory effort.   Soft. Non-tender. Normoactive bowel sounds.   Alert Flat affect     New Labs:  Results     Procedure Component Value Units Date/Time    Glucose Whole Blood - POCT [[id number]]  (Abnormal) Collected:10/11/13 0616     POCT - Glucose Whole blood 148 (H) mg/dL WUJWJXB:14/78/29 5621    Glucose Whole Blood - POCT [[id number]]  (Abnormal) Collected:10/10/13 2122     POCT - Glucose Whole blood 158 (H) mg/dL NGEXBMW:41/32/44 0102    Glucose Whole Blood - POCT [[id number]]  (Abnormal) Collected:10/10/13 1633     POCT - Glucose Whole blood 179 (H) mg/dL HKVQQVZ:56/38/75 6433    Sedimentation rate (ESR) [[id number]]  (Abnormal) Collected:10/10/13 1408    Specimen Information:Blood Updated:10/10/13 1518     Sed Rate 19 (H)     C Reactive Protein [[id number]]  (Abnormal) Collected:10/10/13 1408    Specimen Information:Blood Updated:10/10/13 1459     C-Reactive Protein 5.6 (H) mg/dL     Narrative:    Odette Horns is a nurse draw per sheet. 10/10/2013  08:07    GFR [[id number]] Collected:10/10/13 1408      EGFR >60.0 Updated:10/10/13 1459    Narrative:    Odette Horns is a nurse draw per sheet. 10/10/2013  08:07    Comprehensive metabolic panel [[id number]]  (Abnormal) Collected:10/10/13 1408    Specimen Information:Blood Updated:10/10/13 1459     Glucose 231 (H) mg/dL      BUN 19 mg/dL      Creatinine 0.7 mg/dL      Sodium 623 (L) mEq/L      Potassium 4.5 mEq/L      Chloride 97 (L) mEq/L      CO2 24 mEq/L      CALCIUM 9.2 mg/dL      Protein, Total 6.8 g/dL      Albumin 3.5 g/dL      AST (SGOT) 35 (H) U/L      ALT 67 (H) U/L      Alkaline Phosphatase 80 U/L      Bilirubin, Total 0.5 mg/dL      Globulin 3.3 g/dL      Albumin/Globulin Ratio 1.1     Narrative:    Milan, Perkins is a nurse draw per sheet. 10/10/2013  08:07    CBC and differential [[id number]]  (Abnormal) Collected:10/10/13 1408    Specimen Information:Blood / Blood  Updated:10/10/13 1442     WBC 12.33 (H)      RBC 4.85      Hgb 14.6 g/dL      Hematocrit 16.1 %      MCV 88.2 fL      MCH 30.1 pg      MCHC 34.1 g/dL      RDW 13 %      Platelets 241      MPV 10.9 fL      Neutrophils 65 %      Lymphocytes Automated 26 %      Monocytes 7 %      Eosinophils Automated 2 %      Basophils Automated 0 %      Immature Granulocyte 0 %      Nucleated RBC 0      Neutrophils Absolute 7.99      Abs Lymph Automated 3.26      Abs Mono Automated 0.86      Abs Eos Automated 0.21      Absolute Baso Automated 0.01      Absolute Immature Granulocyte 0.05     Glucose Whole Blood - POCT [[id number]]  (Abnormal) Collected:10/10/13 1205     POCT - Glucose Whole blood 182 (H) mg/dL WJXBJYN:82/95/62 1308    Urine culture [[id number]] Collected:10/09/13 0932    Specimen Information:Urine / Urine, Clean Catch Updated:10/10/13 1117    Narrative:    ORDER#: [id number]                                    ORDERED BY: Tama Gander  SOURCE: Urine, Clean Catch                           COLLECTED:  10/09/13 09:32  ANTIBIOTICS AT COLL.:                                RECEIVED :  10/09/13  13:57  Culture Urine                              FINAL       10/10/13 11:17  10/10/13   No growth of >1,000 CFU/ML, No further work            Current medications:   Scheduled Meds:  Current Facility-Administered Medications   Medication Dose Route Frequency   . cefTRIAXone  2 g Intravenous Q12H SCH   . docusate sodium  100 mg Oral BID   . enoxaparin  40 mg Subcutaneous Daily   . famotidine  20 mg Oral Q12H SCH   . gabapentin  100 mg Oral Q8H SCH   . insulin glargine  35 Units Subcutaneous QAM   . metFORMIN  1,000 mg Oral BID Meals   . oxyCODONE  10 mg Oral Q8H SCH   . polyethylene glycol  17 g Oral Daily   . Senna  17.2 mg Oral QHS   . simvastatin  40 mg Oral Daily with dinner     PRN Meds:.acetaminophen, alum & mag hydroxide-simethicone, bisacodyl, butalbital-acetaminophen-caffeine, chlorproMAZINE, dextrose, dextrose, glucagon (rDNA), insulin aspart, ondansetron, oxyCODONE, oxyCODONE, saline, zolpidem    Assessment: 47 y.o. male with Brain abscess    Plan:   REHAB: Continue comprehensive and intensive inpatient rehab program,  including:   Physical therapy 60-120 min daily, 5-6 times per week, Occupational therapy  60-120 min daily, 5-6 times per week, Speech therapy  60-120 min daily, 5-6 times per week, Case management and Rehabilitation nursing    Will continue to address the following impairments and issues:  Mobility, ADLs, Cognitive impairments, Caregiver training and Community support and resources    - Continue IV Ceftriaxone for MSSA abscess as per ID  - Continue gabapentin for headaches in addition to opioid regimen; will increase dose of the former

## 2013-10-11 NOTE — Rehab PPS CMG ([facility]) (Signed)
[name] Brown  MRN: [id number]  Account: [id number]  Session Start: 10/09/2013 12:00:00 AM  Session Stop: 10/09/2013 12:00:00 AM    PPS CMG Coordinator  Inpatient Rehabilitation Admission    IRF Admission Date:  10/07/13      Admission Class: Initial Rehab.  [facility]  Pre-Hospital Living: Home. Pre-Hospital Living  With: (2) Family/Relatives.  Payer Source: Primary: Not Listed.  Additional Information: [employer].  Secondary: Not Listed.  Additional Information: None.  Ethnic Group:  Caucasian.  Marital Status:  Marital Status: Married.    Impairment Group: 02.1 Non-traumatic  Etiologic Diagnosis Code:   Code      Description                                           Date of Onset  324.0     INTRACRANIAL ABSCESS                                  09/30/2013    Comorbidities:   Rank Code      Description    1    278.00    OBESITY NOS  2    250.02    DM2/NOS UNCOMP [facility]  3    787.20    DYSPHAGIA NOS      PAI Bladder Accidents:  0 - Accidents.    Patient used medications/device this  shift.  10/08/2013 11:00:00 PM  Bladder Score = 6. Patient has not had an accident, but uses a  device/medication.  PAI Bowel Accident: 0 -Accidents.  Patient used medications/device 4 days prior  to admission.  Patient used medications/device this shift.  10/08/2013 6:00:00  PM  Bowel Score = 6. Patient has no accidents, but uses a device/medications.    MEDICAL NEEDS  Height on Admission: 70 inches.  Weight on Admission: 260 pounds.  Swallowing Status: Swallowing Status: Regular Food: solids and liquids swallowed  safely without supervision or modified food consistencies.    QUALITY INDICATORS  Pressure Ulcers: Unhealed Pressure Ulcer(s) Present on Admission: No.  Pressure Ulcer Risk Condition on Admission: Peripheral Vascular Disease (PVD):  No.  Peripheral Arterial Disease (PAD): No.  Diabetes Mellitus (DM): Yes.  Diabetes Retinopathy: No.  Diabetes Nephropathy: No.  Diabetes Neuropathy: No.    Signed by:  Lane Hacker, OTR/L 10/09/2013 4:00:00 PM

## 2013-10-11 NOTE — Rehab Progress Note ([facility]) (Signed)
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  I don't have any therapy goals.  I just  don't want to be in pain."  Pain: Patient currently without complaints of pain.    OBJECTIVE  General Observation: Pt seated in front on uneaten lunch upon OT arrival.    Interventions:       Cognitive Skills:  Pt seen to improve his engagement in life activities  through improved identification of goals/desires.  Pt unable to verbalize any  goals outside of pain control through use of medications.  He reports no  interest in learning compensatory dressing strategies to avoid head movts and  thus changes of intracranial pressure.  Pt reported desire to shave, use the  toilet, and/or eat, but became resistant to any assist with beginning any of  these tasks.  Pt's spouse called in order to generate motivation to participate,  but she was unavailable at time of therapy.  She will be contacted later in the  day for insight into possible motivating factors for the pt.  Pain Reassessment: Pain was not reassessed as no pain was reported.    ASSESSMENT  Pt resistant to any therapy intervention this session, even assist to return to  bed to sleep.  Neuropsychologist contacted to recommend referral and any  possible assist to encourage participation.  Pt's resistance appears to have a  strong cognitive component, but pt's spouse will be contacted to assess the  influence of his personality and baseline status.    PLAN  Continued Occupational Therapy is  recommended.  Recommended Frequency/Duration/Intensity: pt to be seen in OT 5-6 days week for  60-120 min sessions individual or group settings  Continued Activities Contributing Toward Care Plan: ADLs, functional transfers,  strength, endurance, cognitions, safety, energy conservation, pt/family  education, AE/DME needs/rec, d/c planning    3 Hour Rule Minutes: 30 minutes of OT treatment this session count towards  intensity and duration of therapy requirement. Patient was not seen for the full  scheduled time of OT treatment this session secondary to: Pt refused all  intervention despite modifications and encouragement to participate.  Pt unable  to identify reason for refusal.  Will attempt to see patient later today, non-scheduled.    Signed by: [name] Brown, OTR/L 10/11/2013 2:00:00 PM

## 2013-10-11 NOTE — Rehab Progress Note ([facility]) (Signed)
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  Patient reports a pain level of 6 out of 10. Patient medicated. RN adminstered  Oxycodone 15 mg at start of session.    OBJECTIVE  General Observations: Pt. received laying in bed. Pt. with poor initiation and  taking a very long time to complete all mobility tasks throughout session.      Functional Measures      TRANSFERS BED/CHAIR/WHEELCHAIR: Bed/chair/wheelchair Transfer Score = 4.  Patient performs 75% or more of effort and minimal assistance (little/incidental  help/lifting of one limb/steadying) for transferring to and from the  bed/chair/wheelchair, requiring: Help to/from supine only.  Patient requires the following assistive device(s): No assistive devices were  required. Stand pivot to w/c.    LOCOMOTION WHEELCHAIR:   Wheelchair did not occur.    LOCOMOTION WALK:   Walk Distance Scale = 1.  Distance walked is less than 50 feet. Walk Score = 1.   Incidental assistance with lifting, contact guard or steadying was provided.  Patient performs 75% or more of effort and requires minimal contact assistance  for walking. Patient walked a distance of 40 feet. Rolling walker.    LOCOMOTION STAIRS: Stairs did not occur. Unable to assess stairs at this time,  as pt. took the whole session to initiate ambulation.    Interventions:       Therapeutic Activities:   Pt. able to sit up in bed from supine with Min A  and transferred to w/c with stand by assist. Pt. with poor initiation, taking up  to 5 minutes to complete transfer. Pt. brought to therapy gym and agreeable to  ambulation. Pt. took 25 minutes to start to initiate and another 5-10 minutes to  actually stand up. Pt. ambualted 40 feet with CGA. Pt. becoming argumentative  and upset with encouragement to initiate process. Pt. returned to room, sitting  up in w/c.  Pain Reassessment:  Response to Pain Intervention: Continues to have head ache  Post Intervention Pain Quality:  Aching.  Patient Reports Post Intervention Pain Level of: 6 out of 10  Pain Acceptable: No: Pt. returned to quiet room with lights dimmed to rest.  Equipment Provided: The patient has been provided with information and care of  equipment as needed. Pt. provided with RW for stability during ambulation.    Education Provided:    Education Provided: Functional transfers. Gait.       Audience: Patient.       Mode: Explanation.       Response: Needs reinforcement.    ASSESSMENT  Pt. presents with decreased initiation, requiring up to 30 minutes to follow  through with activity, despite encouragement from therapist. Pt. continuing to  request that he needs more time. Pt. with poor awareness of time, stating that  he thought  it had been 2 minutes when it had been 25 minutes.    Long Term Goals: Pt. will perform functional transfers Mod I for safety in home  environment.  Pt. will ambulate >150 feet with supervision and LRAD in order to access room in  hotel that he is living in.  Pt. will negotiate flight of stairs for increased accessibility to community  with supervision.  10-14 days from initial evaluation on 10/08/13  Short Term Goals: Pt. will tolerate gait assessment  Pt. will tolerate stair assessment.  3-5 days from initial evaluation on 10/08/13    Progress Towards Goals: SHORT TERM GOAL REVIEW:       1. Pt. will tolerate gait assessment -  Met  Time frame to achieve short term goal(s):  3-5 days from initial evaluation on  10/08/13    PLAN  Continued Physical Therapy is recommended.  Recommended Frequency/Duration/Intensity: 60-120 minutes per day; 5-6 days per  week for 10-14 days from initial evaluation on 10/08/13  Activities Contributing Toward Care Plan: transfer training, gait training,  stair training, balance training, ther ex, endurance training, neuromuscular  reeducation, DME procurement as needed, d/c planning, family training.    Care Plan  Identified problems from team documentation:  Problem: Impaired Cognition  Cognition: Primary Team Goal: Pt will utilize external memory aids to follow  daily schedule, recall recent/ongoing events, precautions, medications  and  learn compensatory strategies with occasional cues/support./    Problem: Impaired Mobility  Mobility: Primary Team Goal: Pt. will be able to transfer and ambulate >150 feet  with supervision to return to current living situation in hotel setting. [name]    Problem: Impaired Self-care Mgmt/ADL/IADL  Self Care: Primary Team Goal: pt to complete his UB/LB dressing with mod  I/Active    Add/Update Problems from this Treatment:  Update of existing problem:   Mobility: Pt transfers with Min A and ambulating 40 feet with RW and CGA. Pt.  with poor initiation for all functional mobility requiring increased time.    Discipline:  Physical Therapy    Please review Integrated Patient View Care Plan Flowsheet for Team identified  Problems, Interventions, and Goals.    3 Hour Rule Minutes: 60 minutes of PT treatment this session count towards  intensity and duration of therapy requirement. Patient was seen for the full  scheduled time of PT treatment this session.    Signed by: Chauncey Cruel, PT 10/11/2013 11:00:00 AM

## 2013-10-11 NOTE — Rehab Progress Note ([facility]) (Signed)
[name] Brown  MRN: [id number]  Account: [id number]  Session Start: 10/11/2013 12:00:00 AM  Session Stop: 10/11/2013 12:00:00 AM    SEVERE SEPSIS SCREEN  INFECTION:  Patient has documented infection. Is on antibiotic therapy (not  prophylaxis).    SYSTEMATIC IMFLAMMATORY RESPONSE SYNDROME (SIRS):  Patient  has no indication of  systematic inflammatory response syndrome (SIRS). Negative Sepsis Screen.  If you are unable to assess a system's dysfunction because you do not have labs,  or the labs you have are not current (within 24 hours), call physician and  request and order for the lab tests needed.    Signed by: Gretta Arab, RN 10/11/2013 8:15:00 PM

## 2013-10-11 NOTE — Rehab Progress Note ([facility]) (Signed)
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    OBJECTIVE  General Observations: Awake and cooperative. Flat affect with slow processing  and poor initiation.    Interventions:       Speech Treatment: Tx addressed attention, working memory, attention,  initiation, orientation and problem solving. Reviewed use of planner. Pt.  oriented to month and yr only. After 5 minute delay, pt.recalled name of  hospital. Discussed names of therapists and goals. Attempted to train strategies  to learn info but pt, unable to maintain attention. Pt. D for info after 5  minute delay. Immediate story: Pt. needed cues to maintain focus. Able to retell  about 60% immediately after hearing a 2nd time but confused story with internal  thoughts. Working [employer] fast streamer and mental flexibility tasks with short sentences was  at the Max a level. Functional problem solving: Pt. I with use of call bell.  Pain Reassessment: Pain was not reassessed as no pain was reported.    Education Provided:    Education Provided: Cognitive functioning.       Audience: Patient.       Mode: Explanation.       Response: Needs practice.  Needs reinforcement.    ASSESSMENT  Pt. participated in tx but needed multiple redirections due to decreased focused  attention and internal distractions. Pt. at the Max A level for orientation and  memory tasks.    PLAN  Continued Speech Language Pathology is recommended to  address:  Recommended Frequency/Duration/Intensity: 1:1 and /or group skilled SLP services  for 60-120 minutes for three weeks  Continued Activities Contributing Toward Care Plan: cognitive re-training with  hierarchical cognitive tasks and exercises, training in compensatory memory aids  and strategies, education on executive functions,  pt/family education, collaboration with team for d/c.    3 Hour Rule Minutes: 60 minutes of SLP treatment this session count towards  intensity and duration of therapy requirement. Patient was seen for the full  scheduled time of SLP treatment this session.    Signed by: Patrick Jupiter, M.S., CCC-SLP 10/11/2013 10:00:00 AM

## 2013-10-11 NOTE — Rehab Progress Note ([facility]) (Signed)
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  Location: head  Type: Acute  Quality: Aching.  Pain Scale: Numeric.  Patient reports a pain level of 4 out of 10.  Patient's acceptable level of pain 2 out of 10.   Interferes with physical activity.  Pain is alleviated by: pain medication  Pain is exacerbated by: physical activity Patient medicated. scheduled oxycontin  and prn 15 mg po oxy IR Patient medicated. Repositioned patient.  Pain Reassessment:  Response to Pain Intervention: 3/10  Post Intervention Pain Quality:  Aching.  Patient Reports Post Intervention Pain Level of: 3 out of 10  Pain Acceptable: Yes  Patient/Caregiver Goals:  "to be able to care for self"    NEURO  Orientation/Awareness: Alert and Oriented x3.  Speech: No deficits noted at this time.  Behavior: Cooperative.    MEDICATIONS  IV Access: Patient has IV access.     Type: Double Lumen PICC  Location: right upper arm double lumen Length of line:  Care Provided:   Flushed after medication with 5ml normal saline followed by 2.64ml (100u heparin  /ml). Using SASH method. Date Inserted:  Dialysis Access: Patient does not have dialysis access.      Elopement Risk Level Assessment Tool  Patient Criteria: Patient is not capable of leaving the unit.  Assessment is not  applicable.      RISK ASSESSMENT FOR FALLS/INJURY    MENTAL STATUS CRITERIA:   10 - Impaired  memory.   MENTAL STATUS TOTAL: 10    AGE CRITERIA:   2 - < 47 years old  AGE TOTAL: 0    ELIMINATION CRITERIA:   3 - Toileting with Assistance.   ELIMINATION TOTAL: 3    HISTORY OF FALLS CRITERIA:   2 - Unknown History.  HISTORY OF FALLS TOTAL: 2    MEDICATIONS CRITERIA:   2 or more High Risk Medications (*see list below)   MEDICATIONS TOTAL: 2    PHYSICAL MOBILITY CRITERIA:   3 - Decreased balance reaction.   1 - Weakness/impaired physical mobility.   PHYSICAL MOBILITY TOTAL: 4    FALLS RISK ASSESSMENT TOTAL: 21    Patient's Fall Risk: TOTAL SCORE >10: High Risk    Falls Interventions: Clutter removed and clear path to BR.  Call bell, phone, glasses, etc within reach.  Hourly toileting/safety checks between 6am and 10pm, then every 2 hours.  Initiate Fall care plan and outcome.  Yellow "high risk" patient identification in place: wrist band, socks, chart  sticker, door sign.  Low bed with mat  Bed alarm    NUTRITION  Diet:  Type: Consistent carbohydrate.  Food Consistency: Regular.  Liquid Consistency: Thin.      CARDIOVASCULAR     Bilateral lower extremities  Nail Bed Color: Pink.   No edema or redness present.  Pulses:  Apical Pulse: Regular. Strong. Rate is 63 .   Patient does not have a pacemaker.   Patient does not have a defibrillator.    CARDIOPULMONARY  Lung Sounds:   Upper lobes. Clear.   Lower lobes. Clear.  Type of Respirations: Regular.  Cough: No cough noted.  Respiratory Support: The patient does not require any respiratory support.  Respiratory Equipment: None. 98%    INTEGUMENTARY  Skin:  Temperature: Cool  Turgor: Normal for age  Moisture: Dry  Color of skin: Normal for Race/Ethnicity  Capillary Refill: Less than 3 seconds  Wounds/Incisions:     Surgical Incision: Cranial incision. Length: 32 centimeter(s) with sutures. No  signs of infection.  Drainage: Incision without drainage.  Odor:  No  Incision Care: Per protocol.  Braden Scale for Predicting Pressure Sore Risk: Sensory Perception:  No  impairment  Moisture: Occasionally moist  Activity: Chairfast  Mobility: Slightly limited  Nutrition: Adequate  Friction and Shear: Potential problem  Braden Score: 17  Level of Risk: At risk (15-18)   Pressure Relief every 30 minutes while in wheelchair   Turn patient every 2 hours    GASTROINTESTINAL  Abdomen: Soft.  Bowel Sounds:  Active bowel sounds audible in all four quadrants.  Date of Last Bowel Movement:  10/09/13   No Problems/Complaints with Bowel Elimination Assessed.    GENITOURINARY  Current Bladder Pattern: Continent  Color:  Yellow   Patient denies problems with urination and/or catheter.    MUSCULOSKELETAL  Upper Body: Generalized weakness  Lower Body: Gait abnromality and Generalized weakness    Functional Measures    EATING: Eating Score = 5. Patient is supervision/set-up for eating, requiring:  Opening containers.  Buttering bread.  Cutting meat.  Pouring liquids.  Patient requires the following assistive device(s) No assistive devices were  required.    BLADDER MANAGEMENT - LEVEL OF ASSIST: Bladder Score = 5.  Patient is  supervision/set-up for bladder management, requiring: Setting out equipment.  Emptying equipment.  Patient requires the following assistive device(s): Urinal.    BLADDER ACCIDENTS THIS SHIFT:  0 . Patient has not had an accident but used a  urinal this assessment.    BOWEL MANAGEMENT - LEVEL OF ASSIST: Bowel Score = 6.  Patient is modified  independent for bowel management.  Patient did not have bowel movement.  Medication/intervention was provided.    BOWEL ACCIDENTS THIS SHIFT: 0 . Patient has not had an accident, but used a  stool softener.    Education Provided:    Education Provided: Pain management. Pain scale. Medication options. Side  effects. Safety issues and interventions. Fall protocol.       Audience: Patient.       Mode: Explanation.       Response: Applied knowledge.  Needs practice.  Needs reinforcement.    Discharge: Patient is not being discharged at this  time.    Long Term Goals:  Short Term Goals: Pt will consistently use call light for assistance prior to  getting OOB/toilet to prevent fall.  Pt will report pain control at/or<3/10 prior to  and during therapy session with  medications and alternative.  Pt will report any s/s of incision/wound infection in order to receive timely  medical intervention.  7 to 10 days from 10/07/13    PROGRESS TOWARD GOALS: SHORT TERM GOAL REVIEW:       1. Pt will consistently use call light for assistance prior to getting  OOB/toilet to prevent fall. - Not Met: pt  forgets at times to call therefore  bedalarm in place       2. Pt will report pain control at/or<3/10 prior to  and during therapy  session with medications and alternative. - Not Met: pt requiring pain  medication prn  Time frame to achieve short term goal(s):  7 to 10 days from 10/07/13   LONG TERM GOAL REVIEW:  Timeframe to achieve long term goal(s):  3 weeks    PLAN: Nursing Specific Interventions  Diabetic Management. Medical Condition Management. Medication Management. Pain  Management. Skin Management. Wound Management.  Continue with the current Nursing Plan of Care.    TEAM CARE PLAN  Identified problems from team documentation:  Problem: Impaired Cognition  Cognition: Primary Team Goal: Pt will utilize external memory aids to follow  daily schedule, recall recent/ongoing events, precautions, medications  and  learn compensatory strategies with occasional cues/support./    Problem: Impaired Mobility  Mobility: Primary Team Goal: Pt. will be able to transfer and ambulate >150 feet  with supervision to return to current living situation in hotel setting. [name]    Problem: Impaired Self-care Mgmt/ADL/IADL  Self Care: Primary Team Goal: pt to complete his UB/LB dressing with mod  I/Active    Add/Update Problems from this assessment:  No updates at this time.    Please review Integrated Patient View Care Plan Flowsheet for Team identified  Problems, Interventions,  and Goals.    Signed by: Margrett Rud, RN 10/11/2013 12:06:00 PM

## 2013-10-11 NOTE — Rehab Progress Note ([facility]) (Signed)
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  Location: head around incision site  Type: Acute  Quality: Aching.  Pain Scale: Numeric.  Patient reports a pain level of 7 out of 10.  Patient's acceptable level of pain 0 out of 10.   Interferes with sleep. physical activity.  Pain is alleviated by: pain medication  Pain is exacerbated by: Patient medicated. Ice pack applied  Pain Reassessment:  Response to Pain Intervention: reports some relief  Post Intervention Pain Quality:  Aching.  Patient Reports Post Intervention Pain Level of: 3 out of 10  Pain Acceptable: Yes  Patient/Caregiver Goals:  "to be able to care for self"    NEURO  Orientation/Awareness: Alert and Oriented x3.  Speech: No deficits noted at this time.  Behavior: Cooperative.  Poor Judgment.  Poor Initiation.    MEDICATIONS  IV Access: Patient has IV access.     Type: Double Lumen PICC  Location: RUE Length of line:  Care Provided:   Flushed after medication with 5ml normal saline followed by 2.82ml (100u heparin  /ml). Using SASH method. Date Inserted:  Dialysis Access: Patient does not have dialysis access.      Elopement Risk Level Assessment Tool  Patient Criteria: Patient is not capable of leaving the unit.  Assessment is not  applicable.      RISK ASSESSMENT FOR FALLS/INJURY    MENTAL STATUS CRITERIA:   10 - Impaired Judgement/impulsive.   MENTAL  STATUS TOTAL: 10    AGE CRITERIA:   22 - < 47 years old  AGE TOTAL: 0    ELIMINATION CRITERIA:   3 - Toileting with Assistance.   ELIMINATION TOTAL: 3    HISTORY OF FALLS CRITERIA:   2 - Unknown History.  HISTORY OF FALLS TOTAL: 2    MEDICATIONS CRITERIA:   2 or more High Risk Medications (*see list below)   MEDICATIONS TOTAL: 2    PHYSICAL MOBILITY CRITERIA:   3 - Decreased balance reaction.   1 - Weakness/impaired physical mobility.   PHYSICAL MOBILITY TOTAL: 4    FALLS RISK ASSESSMENT TOTAL: 21    Patient's Fall Risk: TOTAL SCORE >10: High Risk    Falls Interventions: Clutter removed and clear path to BR.  Call bell, phone, glasses, etc within reach.  Hourly toileting/safety checks between 6am and 10pm, then every 2 hours.  Initiate Fall care plan and outcome.  Yellow "high risk" patient identification in place: wrist band, socks, chart  sticker, door sign.  Pt and family education.  Assistive devices at [facility].  Pharmacy review of meds.  Bed alarm    NUTRITION  Diet:  Type: Consistent carbohydrate.  Food Consistency: Regular.  Liquid Consistency: Thin.      CARDIOVASCULAR     Bilateral lower extremities  Nail Bed Color: Pink.  No edema or redness present.  Pulses:   Apical Pulse: Regular. Strong. Rate is 71 .   Patient does not have a pacemaker.   Patient does not have a defibrillator.    CARDIOPULMONARY  Lung Sounds:   Upper lobes. Clear.  Type of Respirations: Regular.  Cough: No cough noted.  Respiratory Support: The patient does not require any respiratory support.  Respiratory Equipment: CPAP machine. Pt refused CPAP and O2 via NC at hs. O2 sat  95-97%    INTEGUMENTARY  Skin:  Temperature: Warm  Turgor: Normal for age  Moisture: Dry  Color of skin: Normal for Race/Ethnicity  Capillary Refill: Less than 3 seconds  Wounds/Incisions:     Surgical Incision: Cranial incision. Length: 32 centimeter(s) with sutures. No  signs of infection.  Drainage: Incision without drainage.  Odor:  No  Incision Care: Incision  healing  Braden Scale for Predicting Pressure Sore Risk: Sensory Perception: No  impairment  Moisture: Rarely moist  Activity: Walks occasionally  Mobility: Slightly limited  Nutrition: Adequate  Friction and Shear: Potential problem  Braden Score: 19  Level of Risk: No risk (19-23). Will reassess every shift.    GASTROINTESTINAL  Abdomen: Soft. Nontender.  Bowel Sounds:  Active bowel sounds audible in all four quadrants.  Date of Last Bowel Movement:  10/08/13   No Problems/Complaints with Bowel Elimination Assessed.    GENITOURINARY  Current Bladder Pattern: Continent  Color:  Yellow   Patient denies problems with urination and/or catheter.    MUSCULOSKELETAL  Upper Body: Generalized weakness  Lower Body: Gait abnromality and Generalized weakness    Functional Measures      TOILETING: Toileting Score = 5.  Patient is supervision/set-up for toileting,  requiring: Stand by assistance.  Patient requires the following assistive device(s):  Adaptive device to maintain  balance.  Grab bar.    BLADDER MANAGEMENT - LEVEL OF ASSIST: Bladder Score = 5.  Patient is  supervision/set-up for bladder management, requiring: Setting out equipment.  Emptying equipment.  Patient requires the following assistive device(s): Urinal.    BLADDER ACCIDENTS THIS SHIFT:  0 . Patient has not had an accident but used a  urinal this assessment.    BOWEL MANAGEMENT - LEVEL OF ASSIST: Bowel Score = 6.  Patient is modified  independent for bowel management.  Patient did not have bowel movement.  Medication/intervention was provided.    BOWEL ACCIDENTS THIS SHIFT: 0 . Patient has not had an accident, but used a  stool softener.    TRANSFERS BED/CHAIR/WHEELCHAIR: Bed/chair/wheelchair Transfer Score = 5.  Patient is supervision/set-up for transferring to and from the  bed/chair/wheelchair, requiring: Stand by assistance.  Verbal cuing, prompting, or instructing.  Patient requires the following assistive device(s): Walker.  Bed rails.    TRANSFER  TOILET: Toilet Transfer Score = 5.  Patient is supervision/set-up for  transferring to and from the toilet/commode, requiring: Stand by assistance.  Verbal cuing, prompting, or instructing.  Patient requires the following assistive device(s):  Walker.  Grab bars.    Education Provided:    Education Provided: Precautions. Pain management. Pain scale. Medication  options. Plan of care. Skin/wound care. Incision care. Medication.       Audience: Patient.       Mode: Explanation.       Response: Applied knowledge.    Discharge: Patient is not being discharged at this time.    Long Term Goals:  Short Term Goals: Pt will consistently use call light for assistance prior  to  getting OOB/toilet to prevent fall.  Pt will report pain control at/or<3/10 prior to  and during therapy session with  medications and alternative.  Pt will report any s/s of incision/wound infection in order to receive timely  medical intervention.  7 to 10 days from 10/07/13    PROGRESS TOWARD GOALS: Pt c/o pain (headache) >4/10 and PRN Oxy IR 15mg  and  Fioricet 2 tabs given. Pt impulsive at times and gets OOB without using call  light for assistance of staff. Refused CPAP and O2 via NC at hs. O2 sat 95-97%    PLAN: Nursing Specific Interventions  Diabetic Management. Medical Condition Management. Medication Management. Pain  Management. Skin Management. Wound Management.  Continue with the current Nursing Plan of Care.    TEAM CARE PLAN  Identified problems from team documentation:  Problem: Impaired Cognition  Cognition: Primary Team Goal: Pt will utilize external memory aids to follow  daily schedule, recall recent/ongoing events, precautions, medications  and  learn compensatory strategies with occasional cues/support./    Problem: Impaired Mobility  Mobility: Primary Team Goal: Pt. will be able to transfer and ambulate >150 feet  with supervision to return to current living situation in hotel setting. [name]    Problem: Impaired Self-care  Mgmt/ADL/IADL  Self Care: Primary Team Goal: pt to complete his UB/LB dressing with mod  I/Active    Add/Update Problems from this assessment:  No updates at this time.    Please review Integrated Patient View Care Plan Flowsheet for Team identified  Problems, Interventions, and Goals.    Signed by: Gretta Arab, RN 10/10/2013 11:10:00 PM

## 2013-10-11 NOTE — Rehab Evaluation ([facility]) (Signed)
[name] Brown  MRN: [id number]  Account: [id number]  Session Start: 10/11/2013 11:00:00 AM  Session Stop: 10/11/2013 12:00:00 PM    Therapeutic Recreation  Inpatient Rehabilitation Initial Evaluation    Risks/Benefits of Rehabilitation Discussed with Patient/Caregiver: Yes.    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  History of Present Illness: [name] Brown is a 47 y.o. unknown handed male who  presents to the hospital on 09/30/2013 with a large right frontal abscess with  history of multiple sinus surgeries and recent sinus infection per report.  Patient presented to [facility] today with complaints of AMS x 1 day. HCT  obtained shows a large right frontal abscess with ventricular effacement and 2  cm anterior leftward midline shift. The pt was transfered to [facility] on 09/30/13. He  underwent Reexploration of bicoronal exposure for craniotomy for evacuation of  right frontal intracerebral abscess by Dr. Madaline Savage on 09/30/13. As of  10/06/13 the plan of care per the medicine team:  Plan:    R frontal brain abscess, MSSA s/p craniotomy, removal of SDH. Will need ENT f/u.  Will continue to taper steroids. Na tablet taper    Headache- start oxycontin, prn fioricet    Uncontrolled DM- states he was on 2 po meds at home, continue lantus. Will start  Metformin and glipizide    OSA- will order home CPAP    ppx- Lovenox    Anticipated disc            Date of Onset: 09/30/13            Date of Admission: 10/07/2013 8:46:00 PM    Medications and Allergies: Significant  rehabilitation considerations:   Allergic to morphine and penicillins  Rehabilitation Precautions/Restrictions:   Risk for falls, Aspiration/swallowing, severe processing delays    SUBJECTIVE  Premorbid Functional Level: The patient reported the premorbid level of function  was Pt. was independent with mobility and self care.  Understanding of Current Condition: A/O x3, understands information given but  slow to respond. requires repetition due to poor initiation.  Patient/Caregiver Goals:  Patient's functional goals: "to be able to care for  self"  Pain: Patient currently without complaints of pain.  Social History:  Marital Status: Married  Children: 1 dtr 1son          Reside:  both are out of the area.  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 Pt reports "I'm introspecting" when invited to  return to room during session. Pt smiled occassionally in reaction to peers  present in dayroom.    Leisure Interests:  Pt reports he enjoys time with his son, cards, drinking tea. Has a pet dog.  Pain Reassessment:  Response to Pain Intervention: Pt with comments of headache  Post Intervention Pain Quality:  "hurts"  Patient Reports Post Intervention Pain Level of: 4 out of 10  Pain Acceptable: Yes    Interdisciplinary Educational Needs and Learning Preferences:  Education not  assessed/provided this session.    ASSESSMENT  Summary of Deficits and Related Problems: Patient presents with the following  deficits: Decreased Functional Mobility. Decreased Leisure Independence. Flat  Affect. Decreased Activity Level.  These deficits are secondary to: Cognitive Deficits.  Impaired Strength. Impaired  Balance.  Rehab Potential: Able to participate in an intensive inpatient interdisciplinary  rehabilitation program, Good family/social support, Good premorbid functional  status, Good premorbid medical status, Living in the community premorbidly  Barriers to Progress/Discharge: No potential barriers to progress.    Long Term Goals: Time frame to achieve long term goal(s): 2-3 weeks       1. Pt will demonstrate increased initiation through participation in  leisure activities of interest with min VCs by discharge       2. Pt will demonstrate increased activity tolerance through participation  in a variety of leisure activities off the unit       3. Pt will participate in leisure activity of interest at mod I level in  order to return to premorbid leisure lifestyle  Short Term Goals: Not applicable.    Recommendations/Goals for Rehabilitation Discussed with Patient/Caregiver: Yes.    Pt received ready in WC in room. Pt agreeable to OOR participation in leisure  activity though demonstrating poor activity tolerance/low initiation throughout.  Pt oriented to TR dayroom, program, and activity options. Pt identifies cards,  computers, and time with son as leisure interests. Pt reports "I don't do  anything quickly" upon arrival to dayroom, preferring to observe peers and  comment occassionally on current events discussion rather than participate to  activity at hand. Pt observed with eyes closed throughout session. Pt returned  to room, remaining in [facility], call bell in reach, all needs met.    PLAN  Therapeutic Recreation services are recommended to address: activity tolerance,  cognition, endurance, leisure education and support, socialization, community  re-entry    Care Plan  Identified problems from team documentation:  Problem: Impaired Cognition  Cognition: Primary Team Goal: Pt will utilize external memory aids to follow  daily schedule, recall recent/ongoing events, precautions,  medications  and  learn compensatory strategies with occasional cues/support./    Problem: Impaired Mobility  Mobility: Primary Team Goal: Pt. will be able to transfer and ambulate >150 feet  with supervision to return to current living situation in hotel setting. [name]    Problem: Impaired Self-care Mgmt/ADL/IADL  Self Care: Primary Team Goal: pt to complete his UB/LB dressing with mod  I/Active    Identified problems from this assessment:     No problems identified at this time.    Please review Integrated Patient View Care Plan Flowsheet for Team identified  Problems, Interventions, and Goals.    Signed by: Conception Oms, CTRS 10/11/2013 12:00:00 PM

## 2013-10-11 NOTE — Rehab Progress Note ([facility]) (Signed)
[name] Brown  MRN: [id number]  Account: [id number]  Session Start: 10/11/2013 12:00:00 AM  Session Stop: 10/11/2013 12:00:00 AM    Occupational Therapy  Inpatient Rehabilitation Exception Note    Patient was unable to complete planned therapy session.  0 minutes of OT treatment this session count towards intensity and duration of  therapy requirement. Patient was not seen for the full scheduled time of OT  treatment this session secondary to: Patient refused.  Will attempt to see patient tomorrow,  as scheduled    Signed by: Fernande Boyden, OTR/L 10/11/2013 2:00:00 PM

## 2013-10-12 LAB — GLUCOSE WHOLE BLOOD - POCT
Whole Blood Glucose POCT: 153 mg/dL — ABNORMAL HIGH (ref 70–100)
Whole Blood Glucose POCT: 190 mg/dL — ABNORMAL HIGH (ref 70–100)
Whole Blood Glucose POCT: 136 mg/dL — ABNORMAL HIGH (ref 70–100)
Whole Blood Glucose POCT: 222 mg/dL — ABNORMAL HIGH (ref 70–100)

## 2013-10-12 NOTE — Rehab Progress Note ([facility]) (Signed)
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  Pain Reassessment: Pain was not reassessed as no pain was reported.  Patient/Caregiver Goals:  "to be able to care for self"    NEURO  Orientation/Awareness: Alert and Oriented x3.  Speech: Clear.  Uses Single Words.  Behavior: Cooperative.    MEDICATIONS  IV Access: Patient has IV access.     Type: Double Lumen PICC  Location: Rightt upper arm Length of line:  Care Provided:   Flushed after medication with 5ml normal saline followed by 2.110ml (100u heparin  /ml). Using SASH method. Date Inserted:  Dialysis Access: Patient does not have dialysis access.      Elopement Risk Level Assessment Tool  Patient Criteria: Patient is not capable of leaving the unit.  Assessment is not  applicable.      RISK ASSESSMENT FOR FALLS/INJURY    MENTAL STATUS CRITERIA:   10 - Impaired memory.   MENTAL STATUS TOTAL: 10    AGE CRITERIA:   48 - < 29 years old  AGE TOTAL: 0    ELIMINATION CRITERIA:   3 - Toileting with Assistance.   ELIMINATION TOTAL: 3    HISTORY OF FALLS CRITERIA:   2 - Unknown History.  HISTORY OF FALLS TOTAL: 2    MEDICATIONS CRITERIA:   2 or more High Risk Medications (*see list below)   MEDICATIONS TOTAL: 2    PHYSICAL MOBILITY CRITERIA:   3 - Decreased balance reaction.   1 - Weakness/impaired physical mobility.   PHYSICAL MOBILITY TOTAL: 4    FALLS RISK  ASSESSMENT TOTAL: 21    Patient's Fall Risk: TOTAL SCORE >10: High Risk    Falls Interventions: Clutter removed and clear path to BR.  Call bell, phone, glasses, etc within reach.  Hourly toileting/safety checks between 6am and 10pm, then every 2 hours.  Initiate Fall care plan and outcome.  Yellow "high risk" patient identification in place: wrist band, socks, chart  sticker, door sign.  Assistive devices at [facility].  Reoriented PRN.  Low bed with mat  Bed alarm    NUTRITION  Diet:  Type: Consistent carbohydrate.  Food Consistency: Regular.  Liquid Consistency: Thin.      CARDIOVASCULAR     Bilateral lower extremities  Nail Bed Color: Pink.   No edema or redness present.  Pulses:   Apical Pulse: Regular. Strong. Rate is 63 .   Patient does not have a pacemaker.   Patient does not have a defibrillator.    CARDIOPULMONARY  Lung Sounds:   Upper lobes. Clear.   Lower lobes. Clear.  Type of Respirations: Regular.  Cough: No cough noted.  Respiratory Support: The patient does not require any respiratory support.  Respiratory Equipment: CPAP machine. pt has sleep Apnea paient to use CPAP at  night patient  has been refusing to wear at night    INTEGUMENTARY  Skin:  Temperature: Cool  Turgor: Elastic  Moisture: Dry  Color of skin: Normal for Race/Ethnicity  Capillary Refill: Less than 3 seconds  Wounds/Incisions:     Surgical Incision: Cranial incision. Length: 32 centimeter(s) with sutures. No  signs of infection.  Drainage: Incision without drainage.  Odor:  No  Incision Care: Per protocol.  Braden Scale for Predicting Pressure Sore Risk: Sensory Perception: No  impairment  Moisture: Rarely moist  Activity: Walks occasionally  Mobility: Slightly limited  Nutrition: Adequate  Friction and Shear: Potential problem  Braden Score: 19  Level of Risk: No risk (19-23). Will reassess every shift.    GASTROINTESTINAL  Abdomen: Soft.  Bowel Sounds:  Active bowel sounds audible in all four quadrants.  Date of Last Bowel Movement:   10/11/13   No Problems/Complaints with Bowel Elimination Assessed.    GENITOURINARY  Current Bladder Pattern: Continent  Color:  Yellow   Patient denies problems with urination and/or catheter.    MUSCULOSKELETAL  Upper Body: Generalized weakness  Lower Body: Gait abnromality and Generalized weakness    Functional Measures    EATING: Eating Score = 5. Patient is supervision/set-up for eating, requiring:  Verbal cuing, prompting, or instructing.  Opening containers.  Buttering bread.  Cutting meat.  Pouring liquids.  Patient requires the following assistive device(s) No assistive devices were  required.    BLADDER MANAGEMENT - LEVEL OF ASSIST: Bladder Score = 5.  Patient is  supervision/set-up for bladder management, requiring: Verbal cuing, prompting,  or instructing.  Setting out equipment.  Emptying equipment.  Patient requires the following assistive device(s): Urinal.    BLADDER ACCIDENTS THIS SHIFT:  0 . Patient has not had an accident this  assessment and did not require medications or devices.    BOWEL MANAGEMENT - LEVEL OF ASSIST: Bowel Score = 6.  Patient is modified  independent for bowel management.  Patient did not have bowel movement.  Medication/intervention was provided.    BOWEL ACCIDENTS THIS SHIFT: 0 . Patient has not had an accident, but used a  stool softener.    COMPREHENSION: Both ( auditory and visual) modes of comprehension are used  equally. Comprehension Score = 6, Modified Independence.  Patient comprehends  complex/abstract information in their primary language, requiring: Additional  time.    Education Provided:    Education Provided: Pain management. Medication options. Side effects. Safety  issues and interventions. Fall protocol. Impulsivity.       Audience: Patient.       Mode: Demonstration.       Response: Applied knowledge.  Needs reinforcement.    Discharge: Patient is not being discharged at this time.    Long Term Goals: 3 weeks  Short Term Goals: Pt will consistently use call  light for assistance prior to  getting OOB/toilet to prevent fall. - Goal Not Met  Pt will report pain control at/or<3/10 prior to  and during therapy session with  medications and alternative. - Goal Not Met  Pt will report any s/s of incision/wound infection in order to receive timely  medical intervention.  7 to 10 days from 10/07/13    PROGRESS TOWARD GOALS: SHORT TERM GOAL REVIEW:       1. Pt will consistently use call light for assistance prior to getting  OOB/toilet to prevent fall. - Not Met: patient does no use the call light bed  alarm in place  Time frame to achieve  short term goal(s):  7 to 10 days from 10/07/13   LONG TERM GOAL REVIEW:  Timeframe to achieve long term goal(s):  3 weeks    PLAN: Nursing Specific Interventions  Diabetic Management. Medical Condition Management. Medication Management. Pain  Management. Skin Management. Wound Management.  Continue with the current Nursing Plan of Care.    TEAM CARE PLAN  Identified problems from team documentation:  Problem: Impaired Cognition  Cognition: Primary Team Goal: Pt will utilize external memory aids to follow  daily schedule, recall recent/ongoing events, precautions, medications  and  learn compensatory strategies with occasional cues/support./Active    Problem: Impaired Mobility  Mobility: Primary Team Goal: Pt. will be able to transfer and ambulate >150 feet  with supervision to return to current living situation in hotel setting. [name]    Problem: Impaired Self-care Mgmt/ADL/IADL  Self Care: Primary Team Goal: pt to complete his UB/LB dressing with mod  I/Active    Add/Update Problems from this assessment:  No updates at this time.    Please review Integrated Patient View Care Plan Flowsheet for Team identified  Problems, Interventions, and Goals.    Signed by: Margrett Rud, RN 10/12/2013 2:23:00 PM

## 2013-10-12 NOTE — Rehab Progress Note ([facility]) (Signed)
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  Pain: Patient currently without complaints of pain.    OBJECTIVE  General Observations: Awake and cooperative. Flat affect with slow processing  and poor initiation.    Interventions:       Speech Treatment: Log book review: Mod cues to utilize aide effectively,  and generate appropriate information for entry,  I-pad based attention drills for processing speed (time pressurized) and  initiation: Moderate difficulty, but improving accuracy over successive trials.  Pain Reassessment: Pain was not reassessed as no pain was reported.    Education Provided:  No education provided this session.    ASSESSMENT  Pt presents with mod-sevre cognitive deficits c/b markedly depressed initiation  and slow processing / attention deficits. A very flact affect continues.    PLAN  Continued Speech Language Pathology is recommended to address:  Recommended Frequency/Duration/Intensity: 1:1 and /or group skilled SLP services  for 60-120 minutes for three weeks  Continued Activities Contributing Toward Care Plan: cognitive re-training with  hierarchical cognitive tasks and exercises, training in compensatory memory aids  and strategies, education on executive functions,  pt/family education, collaboration with team for d/c.    3 Hour Rule Minutes: 60 minutes of SLP treatment this session count towards  intensity and duration of therapy requirement. Patient  was seen for the full  scheduled time of SLP treatment this session.    Signed by: Lawson Fiscal, M.S., CCC/SLP 10/12/2013 4:00:00 PM

## 2013-10-12 NOTE — Rehab Progress Note ([facility]) (Signed)
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  Pain: Patient currently has pain.  Patient reports a pain level of 7 out of 10. Patient medicated. Pt. called nurse  for Tylenol during session.    OBJECTIVE  General Observations: Pt. received laying in bed. Pt. with poor initiation and  taking a very long time to complete all mobility tasks throughout session.    Interventions:       Therapeutic Activities:  Pt. agreeable to ambulation. Pt. took 15 minutes  to sit at edge of bed with Min assist to initiate movement. Pt. continued to  take an additional 20-25 minutes to begin gait task. Pt. not responding well to  tactile or verbal cues to initiate movement. Pt. stating that he thought he  heard his parents and sister in the hallway and agreeable to ambulate out there.  Pt. ambulated 30 feet with RW and CS-CGA, requiring rest break due to HA. Pt.  ambulated 50 feet and 100 feet with RW and CGA. Pt. requiring less time to  initiate movement during these rest breaks. Pt. returned to room and returned to  side lying with supervision.  Pain Reassessment:  Response to Pain Intervention: No change in pain  Post Intervention Pain Quality:  Aching.  Patient Reports Post Intervention Pain Level of: 7 out of 10  Pain Acceptable: No: Pt. returned to room with low lighting and decreased noise  to rest.    Education Provided:    Education Provided: Initiation of movement .       Audience: Patient.       Mode:  Explanation.   Tactile and verbal cues       Response: Needs reinforcement.  No evidence of learning.    ASSESSMENT  Pt. continues to take an excessive amount of time to initiate functional tasks.  Pt. with improvements in initiation and continuation of task, once starting. Pt.  with decreased motivation and pain limiting his mobility.    PLAN  Continued Physical Therapy is recommended.  Recommended Frequency/Duration/Intensity: 60-120 minutes per day; 5-6 days per  week for 10-14 days from initial evaluation on 10/08/13  Activities Contributing Toward Care Plan: transfer training, gait training,  stair training, balance training, ther ex, endurance training, neuromuscular  reeducation, DME procurement as needed, d/c planning, family training.    3 Hour Rule Minutes: 60 minutes of PT treatment this session count towards  intensity and duration of therapy requirement. Patient was seen for the full  scheduled time of PT treatment this session.    Signed by: Chauncey Cruel, PT 10/12/2013 3:00:00 PM

## 2013-10-12 NOTE — Consults (Signed)
Service Date: 10/10/2013     Patient Type: I     CONSULTING PHYSICIAN: Johnney Killian MD     REFERRING PHYSICIAN:      REASON FOR CONSULTATION:  Dr. Nile Riggs requested we evaluate this patient with a brain abscess.     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  He was transferred to [facility] where he had emergent  craniotomy performed and drainage of his abscess that grew  methicillin-sensitive Staphylococcus aureus.  He was placed on high-dose  ceftriaxone, has been stabilized and has been transferred to [facility] for further rehabilitative care, and I have been asked to  help follow him on antibiotics.  The patient has had some occasional  headaches.  He seems confused at times, but no clear break in mental  status.  He seems to have tolerated the antibiotic well with no history of  diarrhea, rash, itching or pain elsewhere.     CURRENT ANTIBIOTICS:  Ceftriaxone 2 grams q.12 h.     ALLERGIES:  PENICILLIN and MORPHINE with unknown response.     PAST MEDICAL HISTORY:  Significant for diabetes and allergies.     PAST SURGICAL HISTORY:  Craniotomy.  He has had sinus surgeries as well.  He was apparently on  Cipro and dexamethasone at home prior to his admission.     FAMILY HISTORY:  Noncontributory in this case.     SOCIAL HISTORY:  There is no abuse of alcohol, tobacco, or IV drugs noted.     PHYSICAL EXAMINATION:  VITAL SIGNS:  He is afebrile and has been here since December 12.  Blood  pressure 130/60, pulse of around 70, respirations are 20 and unlabored.  SKIN:  No embolic sign.  HEENT:  Conjunctivae were pale without petechiae.  NECK:  Supple, without adenopathy.  HEENT:  The oropharynx shows no lesions.  LUNGS:  Clear after deep breath and cough.  HEART:  Regular rate and  rhythm without murmur.  ABDOMEN:  No organomegaly and it was nontender.     LABORATORY AND DIAGNOSTIC DATA:  White count of 12.3, hemoglobin of 14.6 and a platelet count of 241.   Creatinine of 0.7.  Liver enzymes show an AST of 35 and ALT of 67.  His  bilirubin was normal, as was his alkaline phosphatase.  Sedimentation rate  was 19.  C-reactive protein of 5.6.     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     RECOMMENDATIONS:  We will continue the anticipated long course of high-dose ceftriaxone with  monitoring of labs on a weekly basis, particularly hematologic effects of  this high dose with further workup as warranted.           D:  10/11/2013 20:32 PM by Dr. Raphael Gibney. Rebecca Eaton, MD [phone])  T:  10/12/2013 06:46 AM by [facility]      (Conf: [id number]) (Doc ID: [id number])

## 2013-10-12 NOTE — Rehab Progress Note ([facility]) (Signed)
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  Pain:Pt with inconsistent reports of pain and inconsistent requests for pain  medication. RN administered additional pain meds at end of session, but he was  premedicated approximately 45 minutes prior to start of OT session.    OBJECTIVE  General Observation: Session cotreated with Bonner Puna, SLP    Interventions:       Self Care/Home Management:  Pt assisted with morning routine with physical  and verbal cueing to initiate and to facilitate linking of activities.  Pt's  routine was marked by extended pauses and high levels of cognitive support  required to initiate tasks.  Physcially, pt completed all portions of grooming  and toileting with SBA/CGA for safety during standing portions.  Pants were  brought to pt's thighs dependently to avoid excessive forward flexion and  resulting increased intracranial pressure/pain.    ASSESSMENT  Pt's severe impairments in processing limit his engagement in life activities  and is a barrier to independence with even basic, routine tasks.  This morning,  he responded positively to high levels of structure, decreased distraction  within the environment, and physical cues for initiation of movement with pt's  consent.  Pt presents with some awareness of the detrimental effects of his  initiation deficit, but does not currently have the capacity  to use logic/reason  to overcome the impairment.    PLAN  Continued Occupational Therapy is recommended.  Recommended Frequency/Duration/Intensity: pt to be seen in OT 5-6 days week for  60-120 min sessions individual or group settings  Continued Activities Contributing Toward Care Plan: ADLs, functional transfers,  strength, endurance, cognitions, safety, energy conservation, pt/family  education, AE/DME needs/rec, d/c planning    3 Hour Rule Minutes: 60 minutes of OT treatment this session count towards  intensity and duration of therapy requirement. Patient was seen for the full  scheduled time of OT treatment this session.    Signed by: Arvella Nigh, OTR/L 10/12/2013 10:00:00 AM

## 2013-10-12 NOTE — Rehab Progress Note ([facility]) (Signed)
NAMEOMARIUS GRANTHAM  MRN: [id number]  Account: [id number]  Session Start: 10/12/2013 12:00:00 AM  Session Stop: 10/12/2013 12:00:00 AM    SEVERE SEPSIS SCREEN  INFECTION:  Patient has documented infection. Is on antibiotic therapy (not  prophylaxis).    SYSTEMATIC IMFLAMMATORY RESPONSE SYNDROME (SIRS):  Patient  has no indication of  systematic inflammatory response syndrome (SIRS). Negative Sepsis Screen.  If you are unable to assess a system's dysfunction because you do not have labs,  or the labs you have are not current (within 24 hours), call physician and  request and order for the lab tests needed.    Signed by: Margrett Rud, RN 10/12/2013 10:43:00 AM

## 2013-10-12 NOTE — Rehab Progress Note ([facility]) (Signed)
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  Location: head  Type: Acute  Quality: Aching.  Pain Scale: Numeric.  Patient reports a pain level of 5 out of 10.  Patient's acceptable level of pain 0 out of 10.   Interferes with sleep. physical activity.  Pain is alleviated by: pain medication and rest  Pain is exacerbated by: movements, bright lights, and noise Patient medicated.  Applied ice.  Pain Reassessment:  Response to Pain Intervention: reports relief  Post Intervention Pain Quality:  Tender.  Patient Reports Post Intervention Pain Level of: 2 out of 10  Pain Acceptable: Yes  Patient/Caregiver Goals:  "to be able to care for self"    NEURO  Orientation/Awareness: Alert and Oriented x3.  Speech: No deficits noted at this time.  Behavior: Cooperative.  Impulsive.  Poor Judgment.  Poor Initiation.    MEDICATIONS  IV Access: Patient has IV access.     Type: Double Lumen PICC  Location: RUE Length of line:  Care Provided:   Flushed after medication with 5ml normal saline followed by 2.17ml (100u heparin  /ml). Using SASH method. Date Inserted:  Dialysis Access: Patient does not have dialysis access.      Elopement Risk Level Assessment Tool  Patient Criteria: Patient is not capable of leaving the unit.  Assessment is not  applicable.      RISK ASSESSMENT FOR FALLS/INJURY    MENTAL STATUS CRITERIA:   10 - Impaired  Judgement/impulsive.   10 - Impaired memory.   MENTAL STATUS TOTAL: 20    AGE CRITERIA:   23 - < 68 years old  AGE TOTAL: 0    ELIMINATION CRITERIA:   3 - Toileting with Assistance.   ELIMINATION TOTAL: 3    HISTORY OF FALLS CRITERIA:   2 - Unknown History.  HISTORY OF FALLS TOTAL: 2    MEDICATIONS CRITERIA:   2 or more High Risk Medications (*see list below)   MEDICATIONS TOTAL: 2    PHYSICAL MOBILITY CRITERIA:   3 - Decreased balance reaction.   1 - Weakness/impaired physical mobility.   PHYSICAL MOBILITY TOTAL: 4    FALLS RISK ASSESSMENT TOTAL: 31    Patient's Fall Risk: TOTAL SCORE >10: High Risk    Falls Interventions: Clutter removed and clear path to BR.  Call bell, phone, glasses, etc within reach.  Hourly toileting/safety checks between 6am and 10pm, then every 2 hours.  Initiate Fall care plan and outcome.  Yellow "high risk" patient identification in place: wrist band, socks, chart  sticker, door sign.  Pt and family education.  Assistive devices at [facility].  Pharmacy review of meds.  Bed alarm    NUTRITION  Diet:  Type: Consistent carbohydrate.  Food Consistency: Regular.  Liquid Consistency: Thin.      CARDIOVASCULAR  Bilateral lower extremities  Nail Bed Color: Pink.   No edema or redness present.  Pulses:   Apical Pulse: Regular. Strong. Rate is 72 .   Patient does not have a pacemaker.   Patient does not have a defibrillator.    CARDIOPULMONARY  Lung Sounds:   Upper lobes. Clear.  Type of Respirations: Regular.  Cough: No cough noted.  Respiratory Support: The patient does not require any respiratory support.  Respiratory Equipment: None. Pt refused CPAP and O2 via NC at hs. O2 sat 95% RA    INTEGUMENTARY  Skin:  Temperature: Warm  Turgor: Normal for age  Moisture: Dry  Color of skin: Normal for Race/Ethnicity  Capillary Refill: Less than 3 seconds  Wounds/Incisions:     Surgical Incision: Cranial incision. Length: 32 centimeter(s) with sutures. No  signs of infection.  Drainage: Incision without  drainage.  Odor:  No  Incision Care: Incision healing  Braden Scale for Predicting Pressure Sore Risk: Sensory Perception: No  impairment  Moisture: Rarely moist  Activity: Walks occasionally  Mobility: Slightly limited  Nutrition: Adequate  Friction and Shear: No apparent problem  Braden Score: 20  Level of Risk: No risk (19-23). Will reassess every shift.    GASTROINTESTINAL  Abdomen: Soft. Nontender.  Bowel Sounds:  Active bowel sounds audible in all four quadrants.  Date of Last Bowel Movement:  10/11/13   No Problems/Complaints with Bowel Elimination Assessed.    GENITOURINARY  Current Bladder Pattern: Continent  Color:  Yellow   Patient denies problems with urination and/or catheter.    MUSCULOSKELETAL  Upper Body: Generalized weakness  Lower Body: Gait abnromality and Generalized weakness    Functional Measures      TOILETING: Toileting Score = 5.  Patient is supervision/set-up for toileting,  requiring: Stand by assistance.  Patient requires the following assistive device(s):  Adaptive device to maintain  balance.  Grab bar.    BLADDER MANAGEMENT - LEVEL OF ASSIST: Bladder Score = 5.  Patient is  supervision/set-up for bladder management, requiring: Setting out equipment.  Emptying equipment.  Patient requires the following assistive device(s): Urinal.    BLADDER ACCIDENTS THIS SHIFT:  0 . Patient has not had an accident but used a  urinal this assessment.    BOWEL MANAGEMENT - LEVEL OF ASSIST: Bowel Score = 6.  Patient is modified  independent for bowel management.  Patient did not have bowel movement.  Medication/intervention was provided.    BOWEL ACCIDENTS THIS SHIFT: 0 . Patient has not had an accident, but used a  stool softener.    Education Provided:    Education Provided: Precautions. Pain management. Pain scale. Medication  options. Plan of care. Safety issues and interventions. Fall protocol.  Impulsivity. Medication. Name and dosage. Purpose.       Audience: Patient.       Mode: Explanation.        Response: No evidence of learning.    Discharge: Patient is not being discharged at this time.    Long Term Goals: 3 weeks  Short Term Goals: Pt will consistently use call light for assistance prior to  getting OOB/toilet to prevent fall. - Goal Not Met  Pt will report pain control at/or<3/10 prior to  and during therapy session with  medications and alternative. - Goal Not Met  Pt will report any s/s of incision/wound infection in order to receive timely  medical intervention.  7 to 10 days from 10/07/13    PROGRESS TOWARD GOALS: Pt  continues to be impulsive at times and tries to get  OOB without assistance. Kept bed alarm on for safety with frequent rounding.  Refised CPAP and O2 via NC at hs.    PLAN: Nursing Specific Interventions  Diabetic Management. Medical Condition Management. Medication Management. Pain  Management. Skin Management. Wound Management.  Continue with the current Nursing Plan of Care.    TEAM CARE PLAN  Identified problems from team documentation:  Problem: Impaired Cognition  Cognition: Primary Team Goal: Pt will utilize external memory aids to follow  daily schedule, recall recent/ongoing events, precautions, medications  and  learn compensatory strategies with occasional cues/support./    Problem: Impaired Mobility  Mobility: Primary Team Goal: Pt. will be able to transfer and ambulate >150 feet  with supervision to return to current living situation in hotel setting. [name]    Problem: Impaired Self-care Mgmt/ADL/IADL  Self Care: Primary Team Goal: pt to complete his UB/LB dressing with mod  I/Active    Add/Update Problems from this assessment:  No updates at this time.    Please review Integrated Patient View Care Plan Flowsheet for Team identified  Problems, Interventions, and Goals.    Signed by: Gretta Arab, RN 10/11/2013 11:15:00 PM

## 2013-10-12 NOTE — Rehab Progress Note ([facility]) (Signed)
[name] Brown  MRN: [id number]  Account: [id number]  Session Start: 10/12/2013 12:00:00 AM  Session Stop: 10/12/2013 12:00:00 AM    Therapeutic Recreation  Inpatient Rehabilitation Exception Note    Patient was unable to complete planned session for the following reasons:   Pt received semi-reclined in bed, lunch tray present, TV on. Pt maintains low  initiation, declined participation in session despite encouragement and  invitiation to holiday party with peers, noted eyes closing. Pt unable to  tolerate this session, remained in bed, call bell in reach, all needs met.  Will attempt to see patient as appropriate.    Signed by: Conception Oms, CTRS 10/12/2013 1:30:00 PM

## 2013-10-12 NOTE — Rehab Progress Note ([facility]) (Addendum)
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  Pt with slow mobility throughout rx.  Pt sat at EOB x 10 min  with SBA.  Pt given repeated max encouragement to stand and ambulate; pt refused  repeatedly.  Pt performed B LE strengthening exercises in sitting x 5-8 reps  with verbal and tactile cues.  Pt left in supine with HOB up at end of rx with  call light in reach and bed alarm on.    3 Hour Rule Minutes: 15 minutes of PT treatment this session count towards  intensity and duration of therapy requirement. Patient was seen for the full  scheduled time of PT treatment this session.    Deleted by: Dan Humphreys, PT(Charting incorrect.)

## 2013-10-12 NOTE — Rehab Progress Note ([facility]) (Signed)
[name]  MRN: [id number]  Account: [id number]  Session Start: 10/12/2013 12:00:00 AM  Session Stop: 10/12/2013 12:00:00 AM    SEVERE SEPSIS SCREEN  INFECTION:  Patient has documented infection. Is on antibiotic therapy (not  prophylaxis).    SYSTEMATIC IMFLAMMATORY RESPONSE SYNDROME (SIRS):  Patient  has no indication of  systematic inflammatory response syndrome (SIRS). Negative Sepsis Screen.  If you are unable to assess a system's dysfunction because you do not have labs,  or the labs you have are not current (within 24 hours), call physician and  request and order for the lab tests needed.    Signed by: Alakai Macbride, RN 10/12/2013 8:00:00 PM

## 2013-10-12 NOTE — Progress Notes (Signed)
[facility] Physiatry Attending Face to Face Progress Note   Functional Status/Update:   Patient's progress will be discussed at length with the interdisciplinary team during our team conference today.  A note with detailed functional status, progress, and goals will be placed in [facility].    Subjective:   Complains of headache and difficulty sleeping (from CPAP mask; RN called resp therapy to evaluate)     Objective:   Filed Vitals:    10/10/13 1940 10/11/13 0525 10/11/13 2009 10/12/13 0607   BP: 133/63 119/71 121/74 118/65   Pulse: 71 63 72 65   Temp: 97 F (36.1 C) 96.3 F (35.7 C) 96.8 F (36 C) 97.3 F (36.3 C)   TempSrc: Oral Oral Oral Oral   Resp: 20 20 18 18    Weight:       SpO2: 97% 98% 95% 96%       Physical Examination:   Appears well.  In no acute distress. Obese.   Anicteric sclerae. Conjunctivae non-injected.   Moist mucous membranes.   +S1S2 Heart rate and rhythm are regular. No significant lower limb edema.   Lungs are clear to auscultation bilaterally. No wheezes, rales, or rhonchi. Good respiratory effort.   Soft. Non-tender. Normoactive bowel sounds.   Alert Pleasant and cooperative     New Labs:  Results     Procedure Component Value Units Date/Time    Glucose Whole Blood - POCT [[id number]]  (Abnormal) Collected:10/12/13 0515     POCT - Glucose Whole blood 153 (H) mg/dL WUJWJXB:14/78/29 5621    Glucose Whole Blood - POCT [[id number]]  (Abnormal) Collected:10/11/13 2152     POCT - Glucose Whole blood 183 (H) mg/dL NGEXBMW:41/32/44 0102    Glucose Whole Blood - POCT [[id number]]  (Abnormal) Collected:10/11/13 1654     POCT - Glucose Whole blood 184 (H) mg/dL HKVQQVZ:56/38/75 6433    Glucose Whole Blood - POCT [[id number]]  (Abnormal) Collected:10/11/13 1201     POCT - Glucose Whole blood 198 (H) mg/dL SAYTKZS:11/04/30 3557          Current medications:   Scheduled Meds:  Current Facility-Administered Medications   Medication Dose Route Frequency   . cefTRIAXone  2 g Intravenous Q12H SCH   . docusate  sodium  100 mg Oral BID   . enoxaparin  40 mg Subcutaneous Daily   . famotidine  20 mg Oral Q12H SCH   . gabapentin  200 mg Oral Q8H SCH   . insulin glargine  35 Units Subcutaneous QAM   . metFORMIN  1,000 mg Oral BID Meals   . oxyCODONE  10 mg Oral Q8H SCH   . polyethylene glycol  17 g Oral Daily   . Senna  17.2 mg Oral QHS   . simvastatin  40 mg Oral Daily with dinner   . 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    Plan:   REHAB: Continue comprehensive and intensive inpatient rehab program, including:   Physical therapy 60-120 min daily, 5-6 times per week, Occupational therapy  60-120 min daily, 5-6 times per week, Speech therapy  60-120 min daily, 5-6 times per week, Case management and Rehabilitation nursing    Will continue to address the following impairments and issues:  Mobility, ADLs, Cognitive impairments, Impaired endurance, Medication management and Community support and  resources    - Continue Rocephin for MSSA brain abscess  - Continue gabapentin and opioid regimen for severe headaches  - Continue Lovenox for DVT prophylaxis

## 2013-10-12 NOTE — Rehab Progress Note ([facility]) (Signed)
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  Pt with slow mobility throughout rx.  Pt sat at EOB x 10 min  with SBA.  Pt given max repeated encouragement to stand and ambulate; pt refused  repeatedly.  Pt performed B LE strengthening exercises in sitting x 5-8 reps  with verbal and tactile cues.  Pt left in supine with HOB up at end of rx with  call light in reach and bed alarm on.    3 Hour Rule Minutes: 15 minutes of PT treatment this session count towards  intensity and duration of therapy requirement. Patient was seen for the full  scheduled time of PT treatment this session.    Signed by: Dan Humphreys, PT 10/12/2013 11:00:00 AM

## 2013-10-13 LAB — GLUCOSE WHOLE BLOOD - POCT
Whole Blood Glucose POCT: 181 mg/dL — ABNORMAL HIGH (ref 70–100)
Whole Blood Glucose POCT: 231 mg/dL — ABNORMAL HIGH (ref 70–100)
Whole Blood Glucose POCT: 156 mg/dL — ABNORMAL HIGH (ref 70–100)
Whole Blood Glucose POCT: 276 mg/dL — ABNORMAL HIGH (ref 70–100)

## 2013-10-13 NOTE — Rehab Progress Note ([facility]) (Signed)
[name] Brown  MRN: [id number]  Account: [id number]  Session Start: 10/12/2013 12:00:00 AM  Session Stop: 10/12/2013 12:00:00 AM    Rehabilitation Nursing  Inpatient Rehabilitation Shift Assessment    Rehab Diagnosis: Gait Abnormality; cognitive-linguistic deficits after brain  abscess (frontal) with craniotomy  Demographics:            Age: 47Y            Gender: Male  Primary Language: English    Date of Onset:  09/30/13  Date of Admission: 10/07/2013 8:46:00 PM    Rehabilitation Precautions Restrictions:   Risk for falls, Aspiration/swallowing, severe processing delays    Patient Report: " I`m fine'  Pain: Patient currently without complaints of pain.  Pain Reassessment: Pain was not reassessed as no pain was reported.  Patient/Caregiver Goals:  "to be able to care for self"    NEURO  Orientation/Awareness: Alert and Oriented x3.  Speech: Clear.  Uses Single Words.  Behavior: Cooperative.    MEDICATIONS  IV Access: Patient has IV access.     Type: Double Lumen PICC  Location: RUE Length of line:  Care Provided:   Flushed with 10 cc normal saline after each used. Date Inserted:  Dialysis Access: Patient does not have dialysis access.      Elopement Risk Level Assessment Tool  Patient Criteria: Patient is not capable of leaving the unit.  Assessment is not  applicable.      RISK ASSESSMENT FOR FALLS/INJURY    MENTAL STATUS CRITERIA:   10 - Impaired memory.   MENTAL STATUS TOTAL: 10    AGE CRITERIA:   37 - < 16 years old  AGE TOTAL: 0    ELIMINATION CRITERIA:   3 - Toileting with Assistance.   ELIMINATION TOTAL: 3    HISTORY OF FALLS CRITERIA:   2 - Unknown History.  HISTORY OF FALLS TOTAL: 2    MEDICATIONS CRITERIA:   2 or more High Risk Medications (*see list below)   MEDICATIONS TOTAL: 2    PHYSICAL MOBILITY CRITERIA:   3 - Decreased balance reaction.   1 - Weakness/impaired physical mobility.   PHYSICAL MOBILITY TOTAL: 4    FALLS RISK ASSESSMENT TOTAL: 21    Patient's Fall Risk: TOTAL SCORE >10: High  Risk    Falls Interventions: Clutter removed and clear path to BR.  Call bell, phone, glasses, etc within reach.  Hourly toileting/safety checks between 6am and 10pm, then every 2 hours.  Yellow "high risk" patient identification in place: wrist band, socks, chart  sticker, door sign.  Pt and family education.  Assistive devices at [facility].  Reoriented PRN.  Pharmacy review of meds.  Bed alarm    NUTRITION  Diet:  Type: Consistent carbohydrate.  Food Consistency: Regular.  Liquid Consistency: Thin.      CARDIOVASCULAR     Bilateral lower extremities  Nail Bed Color: Pink.   No edema or redness present.  Pulses:   Left Brachial Pulse: Regular. Strong. Rate is 94% .   Patient does not have a pacemaker.   Patient does not have a defibrillator.    CARDIOPULMONARY  Lung Sounds:   Upper lobes. Clear.   Lower lobes. Clear.  Type of Respirations: Regular.  Cough: No cough noted.  Respiratory Support:   CPAP. Patient refused to wear. Encouraged to used O2 per Nasal cannula still  refused.  Respiratory Equipment: 94%    INTEGUMENTARY  Skin:  Temperature: Cool  Turgor: Elastic  Moisture: Dry  Color of skin: Normal for Race/Ethnicity  Capillary Refill: Less than 3 seconds  Wounds/Incisions:     Surgical Incision: Cranial incision. Length: 32 centimeter(s) with sutures. No  signs of infection.  Drainage: Incision without drainage.  Odor:  No  Incision Care: Per protocol.  Braden Scale for Predicting Pressure Sore Risk: Sensory Perception: No  impairment  Moisture: Rarely moist  Activity: Walks occasionally  Mobility: Slightly limited  Nutrition: Adequate  Friction and Shear: Potential problem  Braden Score: 19  Level of Risk: No risk (19-23). Will reassess every shift.    GASTROINTESTINAL  Abdomen: Soft. Obese.  Bowel Sounds:  Active bowel sounds audible in all four quadrants.  Date of Last Bowel Movement:  12/16   No Problems/Complaints with Bowel Elimination Assessed.    GENITOURINARY  Current Bladder Pattern: Continent  Color:   Yellow   Patient denies problems with urination and/or catheter.    MUSCULOSKELETAL  Upper Body: Generalized weakness  Lower Body: Gait abnromality and Generalized weakness    Functional Measures      BLADDER MANAGEMENT - LEVEL OF ASSIST: Bladder Score = 5.  Patient is  supervision/set-up for bladder management, requiring: Setting out equipment.  Emptying equipment.  Patient requires the following assistive device(s): Urinal.    BLADDER ACCIDENTS THIS SHIFT:  0 . Patient has not had an accident but used a  urinal this assessment.    BOWEL MANAGEMENT - LEVEL OF ASSIST: Bowel Score = 6.  Patient is modified  independent for bowel management.  Patient did not have bowel movement.  Medication/intervention was provided.    BOWEL ACCIDENTS THIS SHIFT: 0 . Patient has not had an accident, but used a  stool softener.    Education Provided:    Education Provided: Safety issues and interventions. Fall protocol. Supervision  requirements. Use of adaptive devices. Medication. Name and dosage.  Administration. Purpose. Side Effects. Interaction.       Audience: Patient.       Mode: Explanation.       Response: Needs reinforcement.    Discharge: Patient is not being discharged at this time.    Long Term Goals: 3 weeks  Short Term Goals: Pt will consistently use call light for assistance prior to  getting OOB/toilet to prevent fall. - Goal Not Met  Pt will report pain control at/or<3/10 prior to  and during therapy session with  medications and alternative. - Goal Not Met  Pt will report any s/s of incision/wound infection in order to receive timely  medical intervention.  7 to 10 days from 10/07/13    PROGRESS TOWARD GOALS: Patient needs constant cueing to used call light/ask for  assistance before getting up from bed/whelchair. Denies discomfort at this time.    PLAN: Nursing Specific Interventions  Diabetic Management. Medical Condition Management. Medication Management. Pain  Management. Skin Management. Wound  Management.  Continue with the current Nursing Plan of Care.    TEAM CARE PLAN  Identified problems from team documentation:  Problem: Impaired Cognition  Cognition: Primary Team Goal: Pt will utilize external memory aids to follow  daily schedule, recall recent/ongoing events, precautions, medications  and  learn compensatory strategies with occasional cues/support./Active    Problem: Impaired Mobility  Mobility: Primary Team Goal: Pt. will be able to transfer and ambulate >150 feet  with supervision to return to current living situation in hotel setting. [name]    Problem: Impaired Self-care Mgmt/ADL/IADL  Self Care: Primary Team Goal: pt to complete his UB/LB dressing with  mod  I/Active    Add/Update Problems from this assessment:  No updates at this time.    Please review Integrated Patient View Care Plan Flowsheet for Team identified  Problems, Interventions, and Goals.    Signed by: Treyten Monestime, RN 10/12/2013 11:55:00 PM

## 2013-10-13 NOTE — Rehab Progress Note (Medilinks) (Signed)
[name] Brown  MRN: [id number]  Account: [id number]  Session Start: 10/12/2013 12:00:00 AM  Session Stop: 10/12/2013 12:00:00 AM    Case Management  Inpatient Rehabilitation Team Conference    Conference Date/Time: 10/12/2013 11:00:00 AM    Demographics            Age: 61Y            Gender: Male    Admission Date: 10/07/2013 8:46:00 PM  Diagnosis: Gait Abnormality; cognitive-linguistic deficits after brain abscess  (frontal) with craniotomy  Comorbidities:    VITAL SIGNS  Blood Pressure: 118/65 mmHg  Temperature:  degrees  Pulse: 65 beats per minute  Respirations: 18 breaths per minute  Pain: 5/10    WEIGHT and NUTRITION  Admission Weight: 260 pounds; Current Weight: 260pounds  Weight Change since Admit: Patient has had no weight change since admission.  Food Consistency: Regular  Liquid Consistency:Thin    Plan Of Care  Team Members Attending : Dr. Nile Riggs MD, Donnal Moat CM, [facility] RN,  Lawson Fiscal SLP, Arvella Nigh OT, Chauncey Cruel PT, Meagan Jiles Prows TR, Dr.  Dede Query PhD  Anticipated Discharge Date/Estimated Length of Stay: 12/23  Anticipated Discharge Destination: Community discharge with assistance  Discharge Plan : Home with [facility]  Other Team Recommendations: will need home IV antibiotics  Medical Necessity Expected Level Rationale: good potential to reach a  supervision level with mobility and self-care  Intensity and Duration: an average of 3 hours/5 days per week  Medical Supervision and 24 Hour Rehab Nursing: x  Physical Therapy: x  PT Intensity/Duration: 60-120 min daily, 5-6 times per week, approx 2 weeks  Occupational Therapy: x  OT Intensity/Duration: 60-120 min daily, 5-6 times per week, approx 2 weeks  Speech and Language Therapy: x  SLP Intensity/Duration: 60-120 min daily, 5-6 times per week, approx 2 weeks  Therapeutic Recreation: x  Psychology: x  Registered Dietician: x    The following is a list of patient problems that have been identified by the  interdisciplinary  team:    Problem: Impaired Cognition  Cognition Status Update: Min-mod cues for external aide use. Slow processing /  extra time.  Team Identified Barrier to Discharge: Yes  Interventions:  Decrease environmental stimuli: Active  Safety awareness training: Active  Cognition: Primary Team Goal/Status: Pt will utilize external memory aids to  follow daily schedule, recall recent/ongoing events, precautions, medications  and learn compensatory strategies with occasional cues/support. / Active    Problem: Impaired Mobility  Mobility Status Update: Pt transfers with Min A and ambulating 40 feet with RW  and CGA. Pt. with poor initiation for all functional mobility requiring  increased time.  Team Identified Barrier to Discharge: Yes  Interventions:  Transfer training: Active  Gait training: Active  Mobility: Primary Team Goal/Status: Pt. will be able to transfer and ambulate  >150 feet with supervision to return to current living situation in hotel  setting.  / Active    Problem: Impaired Self-care Mgmt/ADL/IADL  Self Care/ADL/IADL Status Update: Pt requires min A with increased time and  verbal cues.  However, pt has refused showering thus far.  Team Identified Barrier to Discharge: Yes  Interventions:  Adaptive equipment training: Active  Compensatory strategies: Active  Encourage patient to participate in activities of daily living: Active  Self Care: Primary Team Goal/Status: pt to complete his UB/LB dressing with mod  I / Active    Fall Risk Assessment: TOTAL SCORE >10: High Risk    Functional Measures  Eating: 5  Grooming: 5  Bathing: 1  Upper Body Dressing: 5  Lower Body Dressing: 4  Toileting: 5  Bladder Level of Assistance: 5  Bowel Level of Assistance: 6  Bed/Chair/Wheelchair Transfer: 4  Toilet Transfer: 5  Tub Transfer: 0  Shower Transfer: 0  Wheelchair: 0  Walk: 1  Stairs: 0  Comprehension: 6  Expression: 5  Social Interaction: 3  Problem Solving: 3  Memory: 3  Bladder Frequency of Accidents - Admission  Period: IRF Score = 6 - No accidents;  uses device  Bowel Frequency of Accidents - Admission Period: IRF Score = 6 - No accidents;  uses device    KEY TO FIM LEVELS (General)  7 The patient completes the task or skill independently in a timely manner.  6 The patient completes the task or skill without a helper but uses an assistive  device, has mild difficulty or needs extra time.  5 The patient performs 100% of the task with supervision.  4 The patient performs 75-90% of the effort to complete the task or skill.  3 The patient performs 50-74% of the effort to complete the task or skill.  2 The patient performs 25-49% of the effort to complete the task or skill.  1 The patient performs 0-24% of the effort to complete the task or skill.  0 The task or skill does not occur because it is unsafe, contraindicated or the  patient refused.    Walk/Wheelchair Functional Measures scoring parameters:  7 Patient walks/propels a minimum of 150 feet independently.  6 Patient walks/propels a minimum of 150 feet without a helper but with a device  or safety concerns.  5 Patient walks/propels 50 feet independently OR walks a minimum of 150 feet  with supervision.  4 Patient performs 75-90% of the effort to walk/propel a minimum of 150 feet.  3 Patient performs 50-74% of the effort to walk/propel a minimum of 150 feet.  2 Patient walks/propels a minimum of 50 feet with any amount of assistance from  a helper.  1 Patient walks/propels less than 50 feet with any amount of assistance or needs  two helpers for any distance.    Stair Functional Measures scoring parameters:  7 Patient goes up and down 12 to 14 stairs independently.  6 Patient goes up and down 12 to 14 stairs with a device but without a helper.  5 Patient goes up and down 4 to 6 stairs independently OR 12 to 14 stairs with  supervision.  4 Patient performs 75-90% of the effort to go up and down 12 to 14 stairs.  3 Patient performs 50-74% of the effort to go up and down 12  to 14 stairs.  2 Patient goes up and down 4 to 6 stairs with any amount of assistance from a  helper.  1 Patient goes up and down less than 4 to 6 stairs, needs two helpers for any  number of stairs or uses a stair lift.    Comments:    Signed by: Donnal Moat, LCSW 10/12/2013 2:48:00 PM    Physician CoSigned By: Tama Gander 10/13/2013 21:09:03

## 2013-10-13 NOTE — Rehab PSY Consult ([facility]) (Addendum)
Corrected 10/13/2013 10:08:15 AM    NAME: [name] Brown  MRN: [id number]  Account: [id number]  Session Start: 10/13/2013 9:00:00 AM  Session Stop: 10/13/2013 10:00:00 AM    Psychology Services  Inpatient Rehabilitation Consultation    Rehab Diagnosis: Acquired brain injury; S/P craniotomy for evacuation of  brain  abscess  Demographics:            Age: 77Y            Gender: Male    Past Medical History: Diabetes, Multiple episodes of sinus infection and sinus  surgery, Hyperlipidemia, OSA    PAST SURGICAL HISTORY:  As above, plus multiple sinus surgeries and question of resection of  frontal lobe during one of those surgeries.  History of Present Illness: [name] Brown is a 47 y.o.male with extensive P MH of  sinus problems and surgeries admitted [facility] 09/29/13 with AMS and headache after  a recent sinus infection. 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 Transferred to [facility] where he underwent urgent craniotomy for evacuation of  intracerebral MSSA abscess. Other medical issues include uncontrolled DM;  was  on 2 po meds at home,            Date of Onset: 09/30/13            Date of Admission: 10/07/2013 8:46:00 PM    Medications and Allergies: Significant rehabilitation considerations:   Allergic to morphine and penicillins  Rehabilitation Precautions/Restrictions:   Risk for falls, Aspiration/swallowing, severe processing delays    SUBJECTIVE  Premorbid Functional Level: The patient reported the premorbid level of function  was independent with mobility and self care.  Understanding of Current Condition: A/O x3, understands information given but  slow to respond. requires repetition due to poor initiation.  Patient/Caregiver Goals:  Patient's functional goals: "to be able to care for  self"  Social History: Married; Several children living elsewhere, one in college.  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 Just recently  returned to [place] from assignment  in [place]. Wife is Tourist information centre manager.    OBJECTIVE    Behavioral Observations and Mental Status:  Pt seen for initial psychological  consultation to assess for adjustment to acquired brain injury and rehab  hospitalization. H and P reviewed. Attended his rehab team's conference  yesterday: ELOS 1 week. Major neurobehavioral issue noted by staff is  moderate-severe dysfunction of initiation. Pt sitting at side of bed after  completing his breakfast. Awake and alert.  Introduced myself and explained my  role. Reviewed events leading to current rehab admission. Reviewed other  relevant psychosocial history. Awake, alert, mild headache, oriented to person,  place, circumstances. Mental status: Cognitively [name] Brown presents with frontal  lobe consistent deficits. As noted in his SLP assessment he has moderate  cognitive linguistic deficits in complex attention, information processing,  memory, organization and reasoning all of which impact functional problem  solving and executive functions in general. Notable in this regard is his  difficulty in initiating mobility and functional activities. Attempts at verbal  prompts have not been particularly successful; may in fact prolong the delay in  his ability to respond. Affect is flat, verbal responsiveness is sparse.  He  reports that his sleep has been poor in the hospital but that his sleep at home  is also poor without using CPAP. The CPAP now seems to aggravate his headaches  so  he doesn't want to use it for now. Appetite is adequate; dependent on degree  of headache.    Interdisciplinary Educational Needs and Learning Preferences:       Learning Preference: The patient's preferred learning method is:  Explanation.       Barriers to Learning: Cognitive limitations.       Learning Needs:  Coping strategies    Education Provided: Coping strategies .       Audience: Patient.       Mode: Demonstration.       Response: Verbalized  understanding.    Interventions:   NPSY HLTH ASSESS INI EA 15 MIN    ASSESSMENT  Impressions:  Mr. Senske presents with frontal lobe consistent cognitive and  neurobehavioral deficits; most prominently impaired initiation and executive  functioning. He has been motivated to participate in his rehab therapies. He  feels his headaches present his biggest challenge which partly reflects impaired  insight into the totality of his impairments.  Potential to Benefit: Able to participate in an intensive inpatient  interdisciplinary rehabilitation program, Good premorbid functional status,  Living in the community premorbidly  Barriers to Progress/Discharge: Functional status, Medical condition, Reduced  insight    Short Term Goals:  Not applicable.  Long Term Goals:  Time frame to achieve long term goal(s): 1 week       1. Pt will demonstrate coping skills for adjusting to medical situation and  rehab hospitalization as reflected in a positive attitude in his therapy  participation    PLAN  Psychology services are recommended to address: Emotional support; Assistance  with coping skills  Recommendations:  Individual counseling as needed; Therapy cotreatments as  needed    Care Plan  Identified problems from team documentation:  Problem: Impaired Cognition  Cognition: Primary Team Goal: Pt will utilize external memory aids to follow  daily schedule, recall recent/ongoing events, precautions, medications  and  learn compensatory strategies with occasional cues/support./Active    Problem: Impaired Mobility  Mobility: Primary Team Goal: Pt. will be able to transfer and ambulate >150 feet  with supervision to return to current living situation in hotel setting. [name]    Problem: Impaired Self-care Mgmt/ADL/IADL  Self Care: Primary Team Goal: pt to complete his UB/LB dressing with mod  I/Active    Identified problems from this assessment:     Psychosocial : Pt will demonstrate coping skills for adjusting to medical  situation  and rehab hospitalization as reflected in a positive attitude in his  therapy participation    Discipline:  Neuropsychology/Psychology    Please review Integrated Patient View Care Plan Flowsheet for Team identified  Problems, Interventions, and Goals.    Signed by: Mordecai Rasmussen, Ph.D., LICENSED CLINICAL PSYCHOLOGIST 10/13/2013  10:00:00 AM

## 2013-10-13 NOTE — Rehab Progress Note ([facility]) (Signed)
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    OBJECTIVE  General Observation: Pt with flat affect and minimal verbalizations throughout  the session.    Interventions:       Self Care/Home Management:  Pt completed ADL routine including shower with  moderate cueing for initiation and safety.  All transfers were ambulatory with  CGA/SBA.  All tx was completed with low-stim environment.  Pain Reassessment: Pain was not reassessed as no pain was reported.  Inpatient Rehabilitation Facility - [facility]    Functional Measures      GROOMING: Grooming Score = 5. Patient is supervision/set-up for grooming,  requiring: Stand by assistance.  Initial preparation.  Patient requires the following assistive device(s) No assistive devices were  required. pt able to apply toothpaste for the first time.    BATHING: Patient bathed in shower. Patient requires no physical assistance for  washing, rinsing, and drying the right arm. Patient requires no physical  assistance for washing, rinsing, and drying the left arm. Patient requires no  physical assistance for washing, rinsing, and drying the chest. Patient requires  no physical assistance for washing, rinsing, and drying the abdomen. Patient  requires no physical assistance for washing, rinsing, and drying the perineal  area. Patient requires no physical assistance for washing, rinsing, and drying  the buttocks.  Patient requires no physical assistance for washing, rinsing, and  drying the right upper leg. Patient requires no physical assistance for washing,  rinsing, and drying the left upper leg. Patient requires minimal assistance for  washing, rinsing, or drying the right lower leg, including the foot. Patient  requires minimal assistance for washing, rinsing, or drying the left lower leg,  including the foot. Patient performs 80 % of bathing tasks. Bathing Score = 4,  Minimal Assistance.  Patient requires the following assistive device(s): Grab bar/arm rest to  maintain balance.  Hand held shower. mod cues    UPPER BODY DRESSING: Upper Body Dressing Score = 5. Patient is  supervision/set-up for upper body dressing, requiring: Gathering/setting out  clothes.  Patient requires the following assistive device(s):  No assistive devices were  required.    LOWER BODY DRESSING: Wearing underwear or an undergarment was not observed for  this patient. Patient requires moderate assistance for donning and/or doffing  pants/skirt threading right leg. Patient requires moderate assistance for  donning and/or doffing pants/skirt threading left leg. Patient requires no  physical assistance for donning and/or doffing pants/skirt over hips and  adjusting fastener. Patient requires maximal assistance for donning and/or  doffing right sock. Patient requires maximal assistance for donning and/or  doffing left sock. Donning and/or doffing right shoe was not observed for this  patient. Donning and/or doffing left shoe was not observed for this patient.  Patient performs 50 % of lower body dressing tasks. Lower Body Dressing Score =  3, Moderate Assistance.  Patient requires  the following assistive device(s): No assistive devices were  required.    TOILETING: Toileting Score = 5.  Patient is supervision/set-up for toileting,  requiring: Stand by assistance.  Verbal cuing, prompting, or instructing.  Patient requires the following assistive  device(s):  Grab bar.    TRANSFER TOILET: Toilet Transfer Score = 4.  Patient performs 75% or more of  effort and minimal assistance (little/incidental help/steadying) for  transferring to and from the toilet/commode, requiring: Contact guard.  Patient requires the following assistive device(s):  Grab bars.  Elevated height toilet. ambulatory without AD    TRANSFER SHOWER: Shower Transfer Score = 4. Patient performs 75% or more of  effort and minimal assistance (little/incidental help/lifting of one  limb/steadying) for transferring to and from the shower, requiring: Contact  guard. Grab bars.  Shower chair. ambulatory with CGA for safety    Education Provided:  No education provided this session.    ASSESSMENT  Pt with dramatic improvement this session in terms of initiation, completed  ambulatory bed>shower transfer, shower and dressing in 35 minutes compared to 25  minutes to stand in earlier sessions. Pt reports 0/10 headache which is likely a  contributing factor.  However, pt also presented with a flater affect and c/o  dizziness which were new to this Clinical research associate.    PLAN  Continued Occupational Therapy is recommended.  Recommended Frequency/Duration/Intensity: pt to be seen in OT 5-6 days week for  60-120 min sessions individual or group settings  Continued Activities Contributing Toward Care Plan: ADLs, functional transfers,  strength, endurance, cognitions, safety, energy conservation, pt/family  education, AE/DME needs/rec, d/c planning    3 Hour Rule Minutes: 60 minutes of OT treatment this session count towards  intensity and duration of therapy requirement. Patient was seen for the full  scheduled time of OT treatment this session.    Signed by: Arvella Nigh, OTR/L 10/13/2013 3:00:00 PM

## 2013-10-13 NOTE — Rehab Progress Note ([facility]) (Signed)
[name] Brown  MRN: [id number]  Account: [id number]  Session Start: 10/13/2013 12:00:00 AM  Session Stop: 10/13/2013 12:00:00 AM    SEVERE SEPSIS SCREEN  INFECTION:  Patient has documented infection. Is on antibiotic therapy (not  prophylaxis).    SYSTEMATIC IMFLAMMATORY RESPONSE SYNDROME (SIRS):  Patient  has no indication of  systematic inflammatory response syndrome (SIRS). Negative Sepsis Screen.  If you are unable to assess a system's dysfunction because you do not have labs,  or the labs you have are not current (within 24 hours), call physician and  request and order for the lab tests needed.    Signed by: Jaice Digioia, RN 10/13/2013 8:00:00 PM

## 2013-10-13 NOTE — Rehab Progress Note ([facility]) (Signed)
[name]  MRN: [id number]  Account: [id number]  Session Start: 10/13/2013 3:00:00 PM  Session Stop: 10/13/2013 4:00:00 PM    Speech Language Pathology  Inpatient Rehabilitation Progress Note - Brief    Rehab Diagnosis: Acquired brain injury; S/P craniotomy for evacuation of  brain  abscess  Demographics:            Age: 84Y            Gender: Male  Rehabilitation Precautions/Restrictions:   Risk for falls, Aspiration/swallowing, severe processing delays    SUBJECTIVE  Patient Report: "I'm a bit fed up with this 'go-button' ".  Pain: Patient currently without complaints of pain.    OBJECTIVE  General Observations: Awake and cooperative. Flat affect with slow processing  and poor initiation.    Interventions:       Speech Treatment: Initiation for tranfsers / mobility: Mod cues / support.  Longest delay was 3-4 mins despite cues.    I-pad based attention / Initiation drills: Active participant but moderate  difficulty due to slow porcessing / distraction.    Leisure activity (card game) at pt's request: mod-max cues/structure.  Pain Reassessment: Pain was not reassessed as no pain was reported.    Education Provided:  No education provided this session.    ASSESSMENT  Pt presents with mod-sevre cognitive deficits c/b markedly depressed initiation  and slow processing / attention deficits. A very flact affect continues. Insight  into range of deficits is emerging, but somewhat limited.    PLAN  Continued Speech Language Pathology is recommended to address:  Recommended Frequency/Duration/Intensity: 1:1 and /or group skilled SLP services  for 60-120 minutes for three weeks  Continued Activities Contributing Toward Care Plan: cognitive re-training with  hierarchical cognitive tasks and exercises, training in compensatory memory aids  and strategies, education on executive functions,  pt/family education, collaboration with team for d/c.    3 Hour Rule Minutes: 60 minutes of SLP treatment this session count  towards  intensity and duration of therapy requirement. Patient was seen for the full  scheduled time of SLP treatment this session.    Signed by: Lawson Fiscal, M.S., CCC/SLP 10/13/2013 4:00:00 PM

## 2013-10-13 NOTE — Progress Notes (Signed)
[facility] Physiatry Attending Face to Face Progress Note   Functional Status/Update:   I reviewed patient's therapy notes to assess functional status and ongoing need for therapies. Of note, close supervision to CGA with gait training.    Subjective:   Feels well and is without complaints. No headache.     Objective:   Filed Vitals:    10/11/13 2009 10/12/13 0607 10/12/13 1948 10/13/13 0549   BP: 121/74 118/65 117/61 103/49   Pulse: 72 65 89 52   Temp: 96.8 F (36 C) 97.3 F (36.3 C) 97.9 F (36.6 C) 97 F (36.1 C)   TempSrc: Oral Oral Oral Oral   Resp: 18 18 17 16    Weight:       SpO2: 95% 96% 94% 97%       Physical Examination:   Appears well.  In no acute distress. Obese.   Anicteric sclerae. Conjunctivae non-injected.   Moist mucous membranes.   +S1S2 Heart rate and rhythm are regular. No significant lower limb edema.   Lungs are clear to auscultation bilaterally. No wheezes, rales, or rhonchi. Good respiratory effort.   Soft. Non-tender. Normoactive bowel sounds.   Alert; apathetic with slow initiation     New Labs:  Results     Procedure Component Value Units Date/Time    Glucose Whole Blood - POCT [[id number]]  (Abnormal) Collected:10/13/13 0550     POCT - Glucose Whole blood 181 (H) mg/dL YQIHKVQ:25/95/63 8756    Glucose Whole Blood - POCT [[id number]]  (Abnormal) Collected:10/12/13 2125     POCT - Glucose Whole blood 222 (H) mg/dL CZYSAYT:01/60/10 9323    Glucose Whole Blood - POCT [[id number]]  (Abnormal) Collected:10/12/13 1619     POCT - Glucose Whole blood 136 (H) mg/dL KYHCWCB:76/28/31 5176    Glucose Whole Blood - POCT [[id number]]  (Abnormal) Collected:10/12/13 1120     POCT - Glucose Whole blood 190 (H) mg/dL YIRSWNI:62/70/35 0093          Current medications:   Scheduled Meds:  Current Facility-Administered Medications   Medication Dose Route Frequency   . cefTRIAXone  2 g Intravenous Q12H SCH   . docusate sodium  100 mg Oral BID   . enoxaparin  40 mg Subcutaneous Daily   . famotidine  20 mg Oral Q12H  SCH   . gabapentin  200 mg Oral Q8H SCH   . insulin glargine  35 Units Subcutaneous QAM   . metFORMIN  1,000 mg Oral BID Meals   . oxyCODONE  10 mg Oral Q8H SCH   . polyethylene glycol  17 g Oral Daily   . Senna  17.2 mg Oral QHS   . simvastatin  40 mg Oral Daily with dinner     PRN Meds:.acetaminophen, alum & mag hydroxide-simethicone, bisacodyl, butalbital-acetaminophen-caffeine, chlorproMAZINE, dextrose, dextrose, glucagon (rDNA), insulin aspart, ondansetron, oxyCODONE, oxyCODONE, saline, zolpidem    Assessment: 47 y.o. male with Brain abscess    Plan:   REHAB: Continue comprehensive and intensive inpatient rehab program, including:   Physical therapy 60-120 min daily, 5-6 times per week, Occupational therapy  60-120 min daily, 5-6 times per week, Speech therapy  60-120 min daily, 5-6 times per week, Case management and Rehabilitation nursing    Will continue to address the following impairments and issues:  Mobility, ADLs and Cognitive impairments    - Continue Rocephin for MSSA brain abscess  - Metformin and Lantus for DM  - Oxycontin and gabapentin for pain/headaches

## 2013-10-13 NOTE — Rehab Progress Note ([facility]) (Signed)
[name] Brown  MRN: [id number]  Account: [id number]  Session Start: 10/13/2013 10:00:00 AM  Session Stop: 10/13/2013 11:00:00 AM    Physical Therapy  Inpatient Rehabilitation Progress Note - Brief    Rehab Diagnosis: Acquired brain injury; S/P craniotomy for evacuation of  brain  abscess  Demographics:            Age: 14Y            Gender: Male  Rehabilitation Precautions/Restrictions:   Risk for falls, Aspiration/swallowing, severe processing delays    SUBJECTIVE  Pain: Patient currently has pain.  Location: Headache  Type: Acute  Quality: Aching.  Pain Scale: Numeric.  Patient reports a pain level of 5 out of 10.  Patient's acceptable level of pain 0 out of 10.   Interferes with physical activity.  Pain is alleviated by: Medication  Pain is exacerbated by: Activity, light, noise Patient medicated.    OBJECTIVE  General Observations: Pt. received sitting up at edge of bed. Pt. seen for  co-treatment with PT mentor.    Interventions:       Therapeutic Activities:  Pt. transferred from sit-stand with supervision  using RW, taking approx5 minutes from initial command. Pt. ambulated to bathroom with  supervision with RW and transferred to toilet with supervision. Pt. sat on  toilet attempting to have BM, but initiated getting up from toilet without cues.  Pt. requiring some verbal and assist initiating hand washing after toileting.  Pt. taking approx5 minutes to initiate ambulating back to bed with supervision. Pt.  transferred from sitting edge of bed with sidelying with supervision. Pt.  received craniosacral techniques for pain management and increasing blood flow.  Pt. reporting improvements in pain. Pt. requiring Max A of 2 people to boost up  in bed. Demonstrated to pt. chair mode on hospital bed for possible pain  management to help with his body adjusting to upright position in the AM, as he  reports increased pain when he first gets up in the morning.  Pain Reassessment:  Response to Pain Intervention:  Improved response with craniosacral techniques  Post Intervention Pain Quality:  Aching.  Patient Reports Post Intervention Pain Level of: 4 out of 10  Pain Acceptable: Yes    Education Provided:    Education Provided: Pain management.       Audience: Patient.       Mode: Explanation.       Response: Verbalized understanding.    ASSESSMENT  Pt. with improved initiation noted during today's session, taking less time to  get up from bed and initiating mobility with less cues. Pt. continues to be  limited by HA, which increases with mobility. Pt. not requiring much physical  assistance, unless assisting with initiating movement.    PLAN  Continued Physical Therapy is recommended.  Recommended Frequency/Duration/Intensity: 60-120 minutes per day; 5-6 days per  week for 10-14 days from initial evaluation on 10/08/13  Activities Contributing Toward Care Plan: transfer training, gait training,  stair training, balance training, ther ex, endurance training, neuromuscular  reeducation, DME procurement as needed, d/c planning, family training.    3 Hour Rule Minutes: 60 minutes of PT treatment this session count towards  intensity and duration of therapy requirement. Patient was seen for the full  scheduled time of PT treatment this session.    Signed by: Chauncey Cruel, PT 10/13/2013 11:00:00 AM

## 2013-10-14 LAB — GLUCOSE WHOLE BLOOD - POCT
Whole Blood Glucose POCT: 163 mg/dL — ABNORMAL HIGH (ref 70–100)
Whole Blood Glucose POCT: 170 mg/dL — ABNORMAL HIGH (ref 70–100)
Whole Blood Glucose POCT: 139 mg/dL — ABNORMAL HIGH (ref 70–100)
Whole Blood Glucose POCT: 141 mg/dL — ABNORMAL HIGH (ref 70–100)

## 2013-10-14 MED ORDER — INSULIN GLARGINE 100 UNIT/ML SC SOLN
38.0000 [IU] | Freq: Every morning | SUBCUTANEOUS | Status: DC
Start: 2013-10-15 — End: 2013-10-18
  Administered 2013-10-15 – 2013-10-18 (×4): 38 [IU] via SUBCUTANEOUS
  Filled 2013-10-14 (×4): qty 380

## 2013-10-14 NOTE — Progress Notes (Signed)
[facility] Physiatry Attending Face to Face Progress Note   Functional Status/Update:   I reviewed patient's therapy notes to assess functional status and ongoing need for therapies. Of note, supervision with functional mobility.  Remains limited by poor initiation.    Subjective:   Complains of headache "so so". Pain is controlled with current meds. No other complaints.     Objective:   Filed Vitals:    10/13/13 0549 10/13/13 1204 10/13/13 2020 10/14/13 0528   BP: 103/49 134/75 99/53 110/62   Pulse: 52 73 84 66   Temp: 97 F (36.1 C)  98.6 F (37 C) 97.3 F (36.3 C)   TempSrc: Oral  Oral Axillary   Resp: 16 14 17 16    Weight:       SpO2: 97% 98% 96% 95%       Physical Examination:   Appears well.  In no acute distress. Obese.   Anicteric sclerae. Conjunctivae non-injected.   Moist mucous membranes.   +S1S2 Heart rate and rhythm are regular. No significant lower limb edema.   Lungs are clear to auscultation bilaterally. No wheezes, rales, or rhonchi. Good respiratory effort.   Soft. Non-tender. Normoactive bowel sounds.   Alert Pleasant and cooperative  Incision c/d/i     New Labs:  Results     Procedure Component Value Units Date/Time    Glucose Whole Blood - POCT [[id number]]  (Abnormal) Collected:10/14/13 0530     POCT - Glucose Whole blood 163 (H) mg/dL MWNUUVO:53/66/44 0347    Glucose Whole Blood - POCT [[id number]]  (Abnormal) Collected:10/13/13 2117     POCT - Glucose Whole blood 276 (H) mg/dL FIEPPIR:51/88/41 6606    Glucose Whole Blood - POCT [[id number]]  (Abnormal) Collected:10/13/13 1626     POCT - Glucose Whole blood 156 (H) mg/dL ATFTDDU:20/25/42 7062    Glucose Whole Blood - POCT [[id number]]  (Abnormal) Collected:10/13/13 1117     POCT - Glucose Whole blood 231 (H) mg/dL VOHYWVP:71/06/26 9485          Current medications:   Scheduled Meds:  Current Facility-Administered Medications   Medication Dose Route Frequency   . cefTRIAXone  2 g Intravenous Q12H SCH   . docusate sodium  100 mg Oral BID   . enoxaparin   40 mg Subcutaneous Daily   . famotidine  20 mg Oral Q12H SCH   . gabapentin  200 mg Oral Q8H SCH   . insulin glargine  35 Units Subcutaneous QAM   . metFORMIN  1,000 mg Oral BID Meals   . oxyCODONE  10 mg Oral Q8H SCH   . polyethylene glycol  17 g Oral Daily   . Senna  17.2 mg Oral QHS   . simvastatin  40 mg Oral Daily with dinner     PRN Meds:.acetaminophen, alum & mag hydroxide-simethicone, bisacodyl, butalbital-acetaminophen-caffeine, chlorproMAZINE, dextrose, dextrose, glucagon (rDNA), insulin aspart, ondansetron, oxyCODONE, oxyCODONE, saline, zolpidem    Assessment: 47 y.o. male with Brain abscess    Plan:   REHAB: Continue comprehensive and intensive inpatient rehab program, including:   Physical therapy 60-120 min daily, 5-6 times per week, Occupational therapy  60-120 min daily, 5-6 times per week, Speech therapy  60-120 min daily, 5-6 times per week, Case management and Rehabilitation nursing    Will continue to address the following impairments and issues:  Mobility, ADLs, Cognitive impairments, Communication impairments, Adherence to precautions, Medication management, Adjustment to disability and Community support and resources    - Increase Lantus for uncontrolled DM  -  Continue Rocephin for MSSA brain abscess  - gabapentin and oxycontin with prn roxicodone for severe headaches

## 2013-10-14 NOTE — Rehab Progress Note ([facility]) (Signed)
[name]  MRN: [id number]  Account: [id number]  Session Start: 10/14/2013 12:00:00 AM  Session Stop: 10/14/2013 12:00:00 AM    SEVERE SEPSIS SCREEN  INFECTION:  Patient has documented infection. Is on antibiotic therapy (not  prophylaxis).    SYSTEMATIC IMFLAMMATORY RESPONSE SYNDROME (SIRS):  Patient  has no indication of  systematic inflammatory response syndrome (SIRS). Negative Sepsis Screen.  If you are unable to assess a system's dysfunction because you do not have labs,  or the labs you have are not current (within 24 hours), call physician and  request and order for the lab tests needed.    Signed by: Margrett Rud, RN 10/14/2013 9:45:00 AM

## 2013-10-14 NOTE — Rehab Progress Note ([facility]) (Signed)
[name]  MRN: [id number]  Account: [id number]  Session Start: 10/14/2013 12:00:00 AM  Session Stop: 10/14/2013 12:00:00 AM    SEVERE SEPSIS SCREEN  INFECTION:  Patient has documented infection. Is on antibiotic therapy (not  prophylaxis).    SYSTEMATIC IMFLAMMATORY RESPONSE SYNDROME (SIRS):  Patient  has no indication of  systematic inflammatory response syndrome (SIRS). Negative Sepsis Screen.  If you are unable to assess a system's dysfunction because you do not have labs,  or the labs you have are not current (within 24 hours), call physician and  request and order for the lab tests needed.    Signed by: Dorna Bloom, RN 10/14/2013 8:00:00 PM

## 2013-10-14 NOTE — Progress Notes (Signed)
Patient changed his mind and used CPAP from 0100 to 0700. Tolerated well.

## 2013-10-14 NOTE — Rehab Progress Note ([facility]) (Signed)
[name] Brown  MRN: [id number]  Account: [id number]  Session Start: 10/14/2013 11:00:00 AM  Session Stop: 10/14/2013 12:00:00 PM    Physical Therapy  Inpatient Rehabilitation Progress Note - Brief    Rehab Diagnosis: Acquired brain injury; S/P craniotomy for evacuation of  brain  abscess  Demographics:            Age: 16Y            Gender: Male  Rehabilitation Precautions/Restrictions:   Risk for falls, Aspiration/swallowing, severe processing delays    SUBJECTIVE  Patient Report: "I'm alright."  Pain: Pt. had received pain medication prior to therapy session. Pt.  demonstrated increased tolerance to being up in w/c outside of room in  stimulating environment without c/o HA.    OBJECTIVE  General Observations: Pt. received sitting up at edge of bed. Pt. seen for  co-treatment with recreational therapist.    Interventions:       Therapeutic Activities:  Session focused on improving initiation in  functional activities. Pt. transferred from sitting edge of bed to w/c 4 feet  away without AD and supervision. Pt. requiring minimal delay to perform  transfer. Pt. engaged in [place] card game. Pt. able to reach for game  pieces and participate in game with fair understanding. Pt. requiring slight  increase in time and reminders when it was his turn, but able to complete each  turn in less than 1 minute. Pt. able to ambulate from hallway to bed at end of  session, while carrying cup of coffee with supervision. No LOB or spilling of  the coffee noted. Pt. following command to ambulate in less than 10 seconds.  Pain Reassessment: Pt. did c/o HA at end of session. Pt. returned to room with  lights off and quiet environment.    Education Provided:    Education Provided: Following instructions for game. .       Audience: Patient.       Mode: Explanation.       Response: Demonstrated skill.  Needs reinforcement.    ASSESSMENT  Pt. with improved participation in social interactions and much improved  initiation noted  in today's session. Pt. with less pain noted, which could  attribute to initiation improvements. Pt. with good tolerance to activity and  able to stay engaged in card game task for multiple games. Pt. able to ambulate  without assistive device and challenging balance with holding cup while  ambulating.    PLAN  Continued Physical Therapy is recommended.  Recommended Frequency/Duration/Intensity: 60-120 minutes per day; 5-6 days per  week for 10-14 days from initial evaluation on 10/08/13  Activities Contributing Toward Care Plan: transfer training, gait training,  stair training, balance training, ther ex, endurance training, neuromuscular  reeducation, DME procurement as needed, d/c planning, family training.    3 Hour Rule Minutes: 60 minutes of PT treatment this session count towards  intensity and duration of therapy requirement. Patient was seen for the full  scheduled time of PT treatment this session.    Signed by: Chauncey Cruel, PT 10/14/2013 12:00:00 PM

## 2013-10-14 NOTE — Rehab Progress Note ([facility]) (Signed)
[name] Brown  MRN: [id number]  Account: [id number]  Session Start: 10/13/2013 12:00:00 AM  Session Stop: 10/13/2013 12:00:00 AM    Rehabilitation Nursing  Inpatient Rehabilitation Shift Assessment    Rehab Diagnosis: Acquired brain injury; S/P craniotomy for evacuation of  brain  abscess  Demographics:            Age: 47Y            Gender: Male  Primary Language: English    Date of Onset:  09/30/13  Date of Admission: 10/07/2013 8:46:00 PM    Rehabilitation Precautions Restrictions:   Risk for falls, Aspiration/swallowing, severe processing delays    Patient Report: " I`m ok".  Pain: Patient currently without complaints of pain.  Pain Reassessment: Pain was not reassessed as no pain was reported.  Patient/Caregiver Goals:  "to be able to care for self"    NEURO  Orientation/Awareness: Alert and Oriented x3.   forgetful  Speech: Clear.  Uses Single Words.  Behavior: Cooperative.  Impulsive.    MEDICATIONS  IV Access: Patient has IV access.     Type: Double Lumen PICC  Location: RUE Length of line:  Care Provided:   Flushed with 10 cc normal saline after each used. Date Inserted:  Dialysis Access: Patient does not have dialysis access.      Elopement Risk Level Assessment Tool  Patient Criteria: Patient is not capable of leaving the unit.  Assessment is not  applicable.      RISK ASSESSMENT FOR FALLS/INJURY    MENTAL STATUS CRITERIA:   10 - Impaired memory.   MENTAL STATUS TOTAL: 10    AGE CRITERIA:   48 - < 61 years old  AGE TOTAL: 0    ELIMINATION CRITERIA:   3 - Toileting with Assistance.   ELIMINATION TOTAL: 3    HISTORY OF FALLS CRITERIA:   2 - Unknown History.  HISTORY OF FALLS TOTAL: 2    MEDICATIONS CRITERIA:   2 or more High Risk Medications (*see list below)   MEDICATIONS TOTAL: 2    PHYSICAL MOBILITY CRITERIA:   3 - Decreased balance reaction.   1 - Weakness/impaired physical mobility.   PHYSICAL MOBILITY TOTAL: 4    FALLS RISK ASSESSMENT TOTAL: 21    Patient's Fall Risk: TOTAL SCORE >10: High  Risk    Falls Interventions: Clutter removed and clear path to BR.  Call bell, phone, glasses, etc within reach.  Hourly toileting/safety checks between 6am and 10pm, then every 2 hours.  Yellow "high risk" patient identification in place: wrist band, socks, chart  sticker, door sign.  Pt and family education.  Assistive devices at [facility].  Reoriented PRN.  Pharmacy review of meds.  Bed alarm    NUTRITION  Diet:  Type: Consistent carbohydrate.  Food Consistency: Regular.  Liquid Consistency: Thin.      CARDIOVASCULAR     Bilateral lower extremities  Nail Bed Color: Pink.   No edema or redness present.  Pulses:   Left Brachial Pulse: Regular. Strong. Rate is 84 .   Patient does not have a pacemaker.   Patient does not have a defibrillator.    CARDIOPULMONARY  Lung Sounds:   Upper lobes. Clear.   Lower lobes. Clear.  Type of Respirations: Regular.  Cough: No cough noted.  Respiratory Support:   CPAP. Patient tried but took it off after 30 minutes.  Respiratory Equipment: 96%    INTEGUMENTARY  Skin:  Temperature: Cool  Turgor: Elastic  Moisture: Dry  Color of skin: Normal for Race/Ethnicity  Capillary Refill: Less than 3 seconds  Wounds/Incisions:     Surgical Incision: Cranial incision. Length: 32 centimeter(s) with sutures. No  signs of infection.  Drainage: Incision without drainage.  Odor:  No  Incision Care: Per protocol.  Braden Scale for Predicting Pressure Sore Risk: Sensory Perception: No  impairment  Moisture: Rarely moist  Activity: Walks occasionally  Mobility: Slightly limited  Nutrition: Adequate  Friction and Shear: Potential problem  Braden Score: 19  Level of Risk: No risk (19-23). Will reassess every shift.    GASTROINTESTINAL  Abdomen: Soft. Obese.  Bowel Sounds:  Active bowel sounds audible in all four quadrants.  Date of Last Bowel Movement:  12/18   No Problems/Complaints with Bowel Elimination Assessed.    GENITOURINARY  Current Bladder Pattern: Continent  Color:  Yellow   Patient denies problems  with urination and/or catheter.    MUSCULOSKELETAL  Upper Body: Generalized weakness  Lower Body: Gait abnromality and Generalized weakness    Functional Measures      TOILETING: Toileting Score = 5.  Patient is supervision/set-up for toileting,  requiring: Stand by assistance.  Patient requires the following assistive device(s):  Grab bar.    BLADDER MANAGEMENT - LEVEL OF ASSIST: Bladder Score = 7. Patient is completely  independent for bladder management. There are no activity limitations.    BLADDER ACCIDENTS THIS SHIFT:  0 . Patient has not had an accident this  assessment and did not require medications or devices.    BOWEL MANAGEMENT - LEVEL OF ASSIST: Bowel Score = 7. Patient is completely  independent for bowel management. There are no activity limitations.    BOWEL ACCIDENTS THIS SHIFT: 0 . Patient has not had an accident and did not  require medications or devices.    TRANSFER TOILET: Toilet Transfer Score = 5.  Patient is supervision/set-up for  transferring to and from the toilet/commode, requiring: Stand by assistance.  Patient requires the following assistive device(s):  Walker.    Education Provided:    Education Provided: Pain management. Pain scale. Medication options. Side  effects. Clinical indicators of pain. Safety issues and interventions. Fall  protocol. Supervision requirements. Use of adaptive devices.       Audience: Patient.       Mode: Explanation.       Response: Needs reinforcement.    Discharge: Patient is not being discharged at this time.    Long Term Goals: 3 weeks  Short Term Goals: Pt will consistently use call light for assistance prior to  getting OOB/toilet to prevent fall. - Goal Not Met  Pt will report pain control at/or<3/10 prior to  and during therapy session with  medications and alternative. - Goal Not Met  Pt will report any s/s of incision/wound infection in order to receive timely  medical intervention.  7 to 10 days from 10/07/13    PROGRESS TOWARD GOALS: Patient  needs constant cues to used call light/ask for  assistance before geting up from bed/wheelchair.    PLAN: Nursing Specific Interventions  Diabetic Management. Medical Condition Management. Medication Management. Pain  Management. Skin Management. Wound Management.  Continue with the current Nursing Plan of Care.    TEAM CARE PLAN  Identified problems from team documentation:  Problem: Impaired Cognition  Cognition: Primary Team Goal: Pt will utilize external memory aids to follow  daily schedule, recall recent/ongoing events, precautions, medications  and  learn compensatory strategies with occasional cues/support./Active    Problem:  Impaired Mobility  Mobility: Primary Team Goal: Pt. will be able to transfer and ambulate >150 feet  with supervision to return to current living situation in hotel setting. [name]    Problem: Impaired Psychosocial Skills/Behavior  PsychoSocial: Primary Team Goal: Pt will demonstrate coping skills for adjusting  to medical situation and rehab hospitalization as reflected in a positive  attitude in his therapy participation/Active    Problem: Impaired Self-care Mgmt/ADL/IADL  Self Care: Primary Team Goal: pt to complete his UB/LB dressing with mod  I/Active    Add/Update Problems from this assessment:  No updates at this time.    Please review Integrated Patient View Care Plan Flowsheet for Team identified  Problems, Interventions, and Goals.    Signed by: Lamarr Feenstra, RN 10/13/2013 11:55:00 PM

## 2013-10-14 NOTE — Rehab Progress Note ([facility]) (Signed)
[name] Brown  MRN: [id number]  Account: [id number]  Session Start: 10/14/2013 11:00:00 AM  Session Stop: 10/14/2013 12:00:00 PM    Therapeutic Recreation  Inpatient Rehabilitation Progress Note    Rehab Diagnosis: Acquired brain injury; S/P craniotomy for evacuation of  brain  abscess  Demographics:            Age: 1Y            Gender: Male  Rehabilitation Precautions/Restrictions:   Risk for falls, Aspiration/swallowing, severe processing delays    SUBJECTIVE  Patient Reports: "Is it my turn?"  Patient/Caregiver Goals:  "to be able to care for self"  Pain: Patient currently without complaints of pain.    OBJECTIVE    Interventions: The following treatment was provided:   Pt seen in cotx with PT Chauncey Cruel  Pain Reassessment: Pain was not reassessed as no pain was reported.    Education Provided:  No education provided this session.    ASSESSMENT  Pt seen this session in cotx with PT to address cognition and mobility within  leisure tasks. Pt participated in various leisure activities in dayroom in  setting, using BUE to manipulate materials at tabletop. Pt demonstrating  increased initiation and activity participation throughout session including  humerous interactions. Pt amb from doorway to bed with supervision while  carrying a cup with supervision, remained seated EOB in preparation for lunch.  Progress Towards Goals:    PLAN  Therapeutic Recreation services are recommended to address: activity tolerance,  cognition, endurance, leisure education and support, socialization, community  re-entry    Care Plan  Identified problems from team documentation:  Problem: Impaired Cognition  Cognition: Primary Team Goal: Pt will utilize external memory aids to follow  daily schedule, recall recent/ongoing events, precautions, medications  and  learn compensatory strategies with occasional cues/support./Active    Problem: Impaired Mobility  Mobility: Primary Team Goal: Pt. will be able to transfer and ambulate >150  feet  with supervision to return to current living situation in hotel setting. [name]    Problem: Impaired Psychosocial Skills/Behavior  PsychoSocial: Primary Team Goal: Pt will demonstrate coping skills for adjusting  to medical situation and rehab hospitalization as reflected in a positive  attitude in his therapy participation/Active    Problem: Impaired Self-care Mgmt/ADL/IADL  Self Care: Primary Team Goal: pt to complete his UB/LB dressing with mod  I/Active    Add/Update Problems from this Treatment:  No updates at this time.    Please review Integrated Patient View Care Plan Flowsheet for Team identified  Problems, Interventions, and Goals.    Signed by: Conception Oms, CTRS 10/14/2013 12:00:00 PM

## 2013-10-14 NOTE — Rehab Progress Note ([facility]) (Signed)
NAMEJEREMAIH Brown  MRN: [id number]  Account: [id number]  Session Start: 10/14/2013 10:00:00 AM  Session Stop: 10/14/2013 11:00:00 AM    Occupational Therapy  Inpatient Rehabilitation Progress Note - Brief    Rehab Diagnosis: Acquired brain injury; S/P craniotomy for evacuation of  brain  abscess  Demographics:            Age: 10Y            Gender: Male  Rehabilitation Precautions/Restrictions:   Risk for falls, Aspiration/swallowing, severe processing delays    SUBJECTIVE  Patient Report: "I know my brain isn't working right and it's frustrating. I  know this isn't normal."  Pain: Pt reported headache at start of session and pain medication was  administered by RN.  No demonstrative in pain level noted, but increase in  drowsiness did negatively impact participation.    OBJECTIVE  Interventions:       Self Care/Home Management:  Pt educated in use of reacher to don/doff pants  without changing head position.  Following education, pt was able to demonstrate  understanding by doffing pants and donning underwear with mod I. Unfortunately,  pt then became drowsy and required assistance to thread pants and don shirt. Pt  handed off to PT at end of session.  Education Provided:    Education Provided: Activities of daily living. Equipment.       Audience: Patient.       Mode: Explanation.  Demonstration.       Response: Needs practice.    ASSESSMENT  Pt with depressed mood today which he contributed to his brain "not being  normal."  Increased insight into deficits also noted which is likely  contributing to change in mood.  He continues to require assist to initiate  familiar tasks, particuarly when he becomes drowsy from pain medication.    PLAN  Continued Occupational Therapy is recommended.  Recommended Frequency/Duration/Intensity: pt to be seen in OT 5-6 days week for  60-120 min sessions individual or group settings  Continued Activities Contributing Toward Care Plan: ADLs, functional transfers,  strength,  endurance, cognitions, safety, energy conservation, pt/family  education, AE/DME needs/rec, d/c planning    3 Hour Rule Minutes: 60 minutes of OT treatment this session count towards  intensity and duration of therapy requirement. Patient was seen for the full  scheduled time of OT treatment this session.    Signed by: Arvella Nigh, OTR/L 10/14/2013 11:00:00 AM

## 2013-10-14 NOTE — Rehab Progress Note ([facility]) (Signed)
[name] Brown  MRN: [id number]  Account: [id number]  Session Start: 10/14/2013 12:00:00 AM  Session Stop: 10/14/2013 12:00:00 AM    Nutrition  Inpatient Rehabilitation Follow Up    Rehab Diagnosis: Acquired brain injury; S/P craniotomy for evacuation of  brain  abscess  Demographics:            Age: 39Y            Gender: Male    Rehabilitation Precautions/Restrictions:   Risk for falls, Aspiration/swallowing, severe processing delays  Medications and Allergies: Significant rehabilitation considerations:   Allergic to morphine and penicillins    SUBJECTIVE  Patient Reports: I'm doing fine, are you going to talk to my wife?  Patient/Caregiver Goals:  "to be able to care for self"    Current Medical Nutrition Therapy: Diet: Consistent Carbohydrate Calories.   diet with no liquid consistency restrictions.    % of Meals Consumed:  75 %  Labs Noted:   Glucose: 156-231  (75-110)  Current Problems/GI Symptoms: No Current GI Problems Noted.    Assessment of Nutrition Status:  Nutritional Risk Level:  Low  Nutrition Diagnosis:   NC-2.2 Altered Nutrition Related Laboratory Values: glu .   continues, but is improved  Current Nutrition Intake: Adequate.  Current Nutrition Status: Adequate.    Interventions:   Assessment Completed.   Nutritional Adequacy Assessed:   Discussed Appropriate Food Choices:    Education Provided:    Education Provided: Nutrition/feeding. Dietary recommendations.       Audience: Patient.       Mode: Teacher, English as a foreign language provided.       Response: Needs reinforcement.   Will try to call wife to talk about diet with her after 2:00 today.    Education Provided: No education provided this session.    ASSESSMENT    Assessment of Nutritional Status: Rec: continue same. Pt wants me to talk to his  wife about the diet.    Progress Towards Goals: Pt meeting needs with intake, blood sugars improved, but  not at goal yet. Will discuss appropriate food choices with pt's wife at his  request.    PLAN  Recommendations  for Follow-up: Will continue to monitor po intake and labs  within 14 days.    Care Plan  Identified problems from team documentation:  Problem: Impaired Cognition  Cognition: Primary Team Goal: Pt will utilize external memory aids to follow  daily schedule, recall recent/ongoing events, precautions, medications  and  learn compensatory strategies with occasional cues/support./Active    Problem: Impaired Mobility  Mobility: Primary Team Goal: Pt. will be able to transfer and ambulate >150 feet  with supervision to return to current living situation in hotel setting. [name]    Problem: Impaired Psychosocial Skills/Behavior  PsychoSocial: Primary Team Goal: Pt will demonstrate coping skills for adjusting  to medical situation and rehab hospitalization as reflected in a positive  attitude in his therapy participation/Active    Problem: Impaired Self-care Mgmt/ADL/IADL  Self Care: Primary Team Goal: pt to complete his UB/LB dressing with mod  I/Active    Add/Update Problems from this Treatment:  No updates at this time.    Please review Integrated Patient View Care Plan Flowsheet for Team identified  Problems, Interventions, and Goals.    Signed by: Leeanne Deed, RD 10/14/2013 12:42:00 PM

## 2013-10-14 NOTE — Progress Notes (Signed)
Reviewed chart; consulted with Rehab Team about Patient's needs.  Anticipate discharge home 12/23 with home infusion/IV abx and home health care; son will assist with care.  Coordinated with RN liason home health care.     Reviewed plan for discharge with Patient.  Phone call to spouse Shiven Junious [phone]; spouse will be home on leave next 2 weeks; understands home infusion services and home health care therapies; will be on unit Mon 12/22 for family training along with son.  Provided emotional support.

## 2013-10-14 NOTE — Rehab Progress Note ([facility]) (Signed)
[name] Brown  MRN: [id number]  Account: [id number]  Session Start: 10/14/2013 12:00:00 AM  Session Stop: 10/14/2013 12:00:00 AM    Rehabilitation Nursing  Inpatient Rehabilitation Shift Assessment    Rehab Diagnosis: Acquired brain injury; S/P craniotomy for evacuation of  brain  abscess  Demographics:            Age: 47Y            Gender: Male  Primary Language: English    Date of Onset:  09/30/13  Date of Admission: 10/07/2013 8:46:00 PM    Rehabilitation Precautions Restrictions:   Risk for falls, Aspiration/swallowing, severe processing delays    Patient Report: "I am fine "  Pain: Patient currently without complaints of pain.  Pain Reassessment: Pain was not reassessed as no pain was reported.  Patient/Caregiver Goals:  "to be able to care for self"    NEURO  Orientation/Awareness: Alert and Oriented x3.  Speech: Clear.  Behavior: Cooperative.    MEDICATIONS  IV Access: Patient has IV access.     Type: Double Lumen PICC  Location: Right upper arm Length of line:  Care Provided:   Flushed after medication with 5ml normal saline followed by 2.43ml (100u heparin  /ml). Using SASH method. Date Inserted:  Dialysis Access: Patient does not have dialysis access.      Elopement Risk Level Assessment Tool  Patient Criteria: Patient is not capable of leaving the unit.  Assessment is not  applicable.      RISK ASSESSMENT FOR FALLS/INJURY    MENTAL STATUS CRITERIA:   10 - Impaired memory.   MENTAL STATUS TOTAL: 10    AGE CRITERIA:   48 - < 63 years old  AGE TOTAL: 0    ELIMINATION CRITERIA:   3 - Toileting with Assistance.   ELIMINATION TOTAL: 3    HISTORY OF FALLS CRITERIA:   2 - Unknown History.  HISTORY OF FALLS TOTAL: 2    MEDICATIONS CRITERIA:   2 or more High Risk Medications (*see list below)   MEDICATIONS TOTAL: 2    PHYSICAL MOBILITY CRITERIA:   3 - Decreased balance reaction.   1 - Weakness/impaired physical mobility.   PHYSICAL MOBILITY TOTAL: 4    FALLS RISK ASSESSMENT TOTAL: 21    Patient's Fall Risk: TOTAL  SCORE >10: High Risk    Falls Interventions: Clutter removed and clear path to BR.  Call bell, phone, glasses, etc within reach.  Hourly toileting/safety checks between 6am and 10pm, then every 2 hours.  Initiate Fall care plan and outcome.  Yellow "high risk" patient identification in place: wrist band, socks, chart  sticker, door sign.  Pt and family education.  Assistive devices at [facility].  Reoriented PRN.  Family at bedside.  Low bed with mat  Bed alarm    NUTRITION  Diet:  Type: Consistent carbohydrate.  Food Consistency: Regular.  Liquid Consistency: Thin.      CARDIOVASCULAR     Bilateral lower extremities  Nail Bed Color: Pink.   No edema or redness present.  Pulses:   Apical Pulse: Regular. Strong. Rate is 52 .   Patient does not have a pacemaker.   Patient does not have a defibrillator.    CARDIOPULMONARY  Lung Sounds:   Upper lobes. Clear.   Lower lobes. Clear.  Type of Respirations: Regular.  Cough: No cough noted.  Respiratory Support:   CPAP. CPAP at night  Respiratory Equipment: None. 97%    INTEGUMENTARY  Skin:  Temperature:  Cool  Turgor: Normal for age  Moisture: Dry  Color of skin: Normal for Race/Ethnicity  Capillary Refill: Less than 3 seconds  Wounds/Incisions:     Surgical Incision: Cranial incision. Length: 32 centimeter(s) with sutures. No  signs of infection.  Drainage: Incision without drainage.  Odor:  No  Incision Care: Per protocol.  Braden Scale for Predicting Pressure Sore Risk: Sensory Perception: Slightly  limited  Moisture: Occasionally moist  Activity: Walks occasionally  Mobility: Slightly limited  Nutrition: Adequate  Friction and Shear: No apparent problem  Braden Score: 18  Level of Risk: At risk (15-18)   Assist patient to the bathroom every 2 hours    GASTROINTESTINAL  Abdomen: Soft.  Bowel Sounds:  Active bowel sounds audible in all four quadrants.  Date of Last Bowel Movement:  10/13/13   No Problems/Complaints with Bowel Elimination Assessed.    GENITOURINARY  Current Bladder  Pattern: Continent  Color:  Yellow   Patient denies problems with urination and/or catheter.    MUSCULOSKELETAL  Upper Body: Generalized weakness  Lower Body: Gait abnromality and Generalized weakness    Functional Measures    EATING: Eating Score = 5. Patient is supervision/set-up for eating, requiring:  Opening containers.  Buttering bread.  Cutting meat.  Pouring liquids.  Patient requires the following assistive device(s) No assistive devices were  required.    BLADDER MANAGEMENT - LEVEL OF ASSIST: Bladder Score = 7. Patient is completely  independent for bladder management. There are no activity limitations.    BLADDER ACCIDENTS THIS SHIFT:  0 . Patient has not had an accident this  assessment and did not require medications or devices.    BOWEL MANAGEMENT - LEVEL OF ASSIST: Bowel Score = 6.  Patient is modified  independent for bowel management.  Patient did not have bowel movement.  Medication/intervention was provided.    BOWEL ACCIDENTS THIS SHIFT: 0 . Patient has not had an accident, but used a  stool softener.    TRANSFER TOILET: Toilet Transfer Score = 5.  Patient is supervision/set-up for  transferring to and from the toilet/commode, requiring: Stand by assistance.  Verbal cuing, prompting, or instructing.  Patient requires the following assistive device(s):  Walker.    Education Provided:    Education Provided: Pain management. Pain scale. Side effects. Safety issues and  interventions. Fall protocol.       Audience: Patient.       Mode: Explanation.       Response: Applied knowledge.  Needs reinforcement.    Discharge: Patient is not being discharged at this time.    Long Term Goals: 3 weeks  Short Term Goals: Pt will consistently use call light for assistance prior to  getting OOB/toilet to prevent fall. - Goal Not Met  Pt will report pain control at/or<3/10 prior to  and during therapy session with  medications and alternative. - Goal Not Met  Pt will report any s/s of incision/wound infection in  order to receive timely  medical intervention.  7 to 10 days from 10/07/13    PROGRESS TOWARD GOALS: SHORT TERM GOAL REVIEW:       1. Pt will consistently use call light for assistance prior to getting  OOB/toilet to prevent fall. - Not Met: Bed alarm in place       2. Pt will report pain control at/or<3/10 prior to  and during therapy  session with medications and alternative. - Met  Time frame to achieve short term goal(s):  7 to  10 days from 10/07/13   LONG TERM GOAL REVIEW:  Timeframe to achieve long term goal(s):  3 weeks    PLAN: Nursing Specific Interventions  Diabetic Management. Medical Condition Management. Medication Management. Pain  Management. Skin Management. Wound Management.  Continue with the current Nursing Plan of Care.    TEAM CARE PLAN  Identified problems from team documentation:  Problem: Impaired Cognition  Cognition: Primary Team Goal: Pt will utilize external memory aids to follow  daily schedule, recall recent/ongoing events, precautions, medications  and  learn compensatory strategies with occasional cues/support./Active    Problem: Impaired Mobility  Mobility: Primary Team Goal: Pt. will be able to transfer and ambulate >150 feet  with supervision to return to current living situation in hotel setting. [name]    Problem: Impaired Psychosocial Skills/Behavior  PsychoSocial: Primary Team Goal: Pt will demonstrate coping skills for adjusting  to medical situation and rehab hospitalization as reflected in a positive  attitude in his therapy participation/Active    Problem: Impaired Self-care Mgmt/ADL/IADL  Self Care: Primary Team Goal: pt to complete his UB/LB dressing with mod  I/Active    Add/Update Problems from this assessment:  No updates at this time.    Please review Integrated Patient View Care Plan Flowsheet for Team identified  Problems, Interventions, and Goals.    Signed by: Margrett Rud, RN 10/14/2013 11:48:00 AM

## 2013-10-14 NOTE — Rehab Progress Note ([facility]) (Signed)
NAMEARMISTEAD Brown  MRN: [id number]  Account: [id number]  Session Start: 10/14/2013 12:00:00 AM  Session Stop: 10/14/2013 12:00:00 AM    Rehabilitation Nursing  Inpatient Rehabilitation Shift Assessment    Rehab Diagnosis: Acquired brain injury; S/P craniotomy for evacuation of  brain  abscess  Demographics:            Age: 47Y            Gender: Male  Primary Language: English    Date of Onset:  09/30/13  Date of Admission: 10/07/2013 8:46:00 PM    Rehabilitation Precautions Restrictions:   Risk for falls, Aspiration/swallowing, severe processing delays    Patient Report: 'I'm always in pain, but i"m okey"  Pain: Patient currently has pain.  Location: head  Type: Acute  Quality: Aching.  Pain Scale: Numeric.  Patient reports a pain level of 2 out of 10.  Patient's acceptable level of pain 2 out of 10.   Pain does not interfere with any activity at this time.  Pain is alleviated by:  Pain is exacerbated by: Patient medicated.  Pain Reassessment:  Response to Pain Intervention: scheduled pain medication at bedtime  Post Intervention Pain Quality:  None.  Patient Reports Post Intervention Pain Level of: 2 out of 10  Pain Acceptable: Yes  Patient/Caregiver Goals:  "to be able to care for self"    NEURO  Orientation/Awareness: Alert and Oriented x3.  Speech: No deficits noted at this time.   slow to response  Behavior: Cooperative.   has flat affect    MEDICATIONS  IV Access: Patient has IV access.     Type: Double Lumen PICC  Location: RUA Length of line:   unknown  Care Provided:   Flushed after medication with 5ml normal saline followed by 2.55ml (100u heparin  /ml). Using SASH method. Date Inserted:   unknown  Dialysis Access: Patient does not have dialysis access.      Elopement Risk Level Assessment Tool  Patient Criteria:  CATEGORY 1 CRITERIA:   No category 1 criteria. Elopement Risk:  No      RISK ASSESSMENT FOR FALLS/INJURY    MENTAL STATUS CRITERIA:   10 - Impaired memory.   MENTAL STATUS TOTAL: 10    AGE  CRITERIA:   25 - < 68 years old  AGE TOTAL: 0    ELIMINATION CRITERIA:   3 - Toileting with Assistance.   ELIMINATION TOTAL: 3    HISTORY OF FALLS CRITERIA:   2 - Unknown History.  HISTORY OF FALLS TOTAL: 2    MEDICATIONS CRITERIA:   2 or more High Risk Medications (*see list below)   MEDICATIONS TOTAL: 2    PHYSICAL MOBILITY CRITERIA:   3 - Decreased balance reaction.   1 - Weakness/impaired physical mobility.   PHYSICAL MOBILITY TOTAL: 4    FALLS RISK ASSESSMENT TOTAL: 21    Patient's Fall Risk: TOTAL SCORE is <=10:  Low Risk.    Falls Interventions: Clutter removed and clear path to BR.  Call bell, phone, glasses, etc within reach.  Hourly toileting/safety checks between 6am and 10pm, then every 2 hours.  Pt and family education.   low bed    NUTRITION  Diet:  Type: Consistent carbohydrate.  Food Consistency: Regular.  Liquid Consistency: Thin.   with Fluid restriction 1565ml/day      CARDIOVASCULAR     Bilateral lower extremities  Nail Bed Color: Pink.   No edema or redness present.  Pulses:   Apical Pulse:  Regular. Strong. Rate is 83 .   Patient does not have a pacemaker.   Patient does not have a defibrillator.    CARDIOPULMONARY  Lung Sounds:   Upper lobes. Clear.   Lower lobes. Clear.  Type of Respirations:  Cough: No cough noted.  Respiratory Support:   CPAP. at bedtime  Respiratory Equipment: CPAP machine.    INTEGUMENTARY  Skin:  Temperature: Warm  Turgor: Normal for age  Moisture: Dry  Color of skin: Normal for Race/Ethnicity  Capillary Refill: Less than 3 seconds  Wounds/Incisions:     Surgical Incision: Cranial incision. Length: 32 centimeter(s) with glue. No  signs of infection.  Drainage: Incision without drainage.  Odor:  No  Incision Care: Incision healing  Braden Scale for Predicting Pressure Sore Risk: Sensory Perception: No  impairment  Moisture: Rarely moist  Activity: Walks occasionally  Mobility: Slightly limited  Nutrition: Adequate  Friction and Shear: No apparent problem  Braden Score:  20  Level of Risk: No risk (19-23). Will reassess every shift.    GASTROINTESTINAL  Abdomen: Soft. Nontender.  Bowel Sounds:  Active bowel sounds audible in all four quadrants.  Date of Last Bowel Movement:  12/18   No Problems/Complaints with Bowel Elimination Assessed.    GENITOURINARY  Current Bladder Pattern: Continent  Color:   Patient denies problems with urination and/or catheter.    MUSCULOSKELETAL  Upper Body: Generalized weakness  Lower Body: Gait abnromality and Generalized weakness    Functional Measures      TOILETING: Toileting Score = 6.  Patient is modified independent for toileting,  requiring: Safety considerations.    BLADDER MANAGEMENT - LEVEL OF ASSIST: Bladder Score = 7. Patient is completely  independent for bladder management. There are no activity limitations.    BLADDER ACCIDENTS THIS SHIFT:  0 . Patient has not had an accident this  assessment and did not require medications or devices.    TRANSFERS BED/CHAIR/WHEELCHAIR: Bed/chair/wheelchair Transfer Score = 4.  Patient performs 75% or more of effort and minimal assistance (little/incidental  help/lifting of one limb/steadying) for transferring to and from the  bed/chair/wheelchair, requiring: Steadying.  Patient requires the following assistive device(s): Walker.    TRANSFER TOILET: Toilet Transfer Score = 5.  Patient is supervision/set-up for  transferring to and from the toilet/commode, requiring: Stand by assistance.  Patient requires the following assistive device(s):  Grab bars.    Education Provided:    Education Provided: Safety issues and interventions. Fall protocol. Pain  management. Pain scale. Side effects. Skin/wound care. Prevention of skin  breakdown. Role of nutrition in wound prevention/healing.       Audience: Patient.       Mode: Explanation.       Response: Verbalized understanding.    Discharge: Patient is not being discharged at this time.    Long Term Goals: 3 weeks  Short Term Goals: Pt will consistently use call  light for assistance prior to  getting OOB/toilet to prevent fall. - Goal Not Met  Pt will report pain control at/or<3/10 prior to  and during therapy session with  medications and alternative. - Goal Met  Pt will report any s/s of incision/wound infection in order to receive timely  medical intervention.  7 to 10 days from 10/07/13    PROGRESS TOWARD GOALS:    PLAN: Nursing Specific Interventions  Diabetic Management. Medical Condition Management. Medication Management. Pain  Management. Skin Management. Wound Management.  Continue with the current Nursing Plan of Care.  TEAM CARE PLAN  Identified problems from team documentation:  Problem: Impaired Cognition  Cognition: Primary Team Goal: Pt will utilize external memory aids to follow  daily schedule, recall recent/ongoing events, precautions, medications  and  learn compensatory strategies with occasional cues/support./Active    Problem: Impaired Mobility  Mobility: Primary Team Goal: Pt. will be able to transfer and ambulate >150 feet  with supervision to return to current living situation in hotel setting. [name]    Problem: Impaired Psychosocial Skills/Behavior  PsychoSocial: Primary Team Goal: Pt will demonstrate coping skills for adjusting  to medical situation and rehab hospitalization as reflected in a positive  attitude in his therapy participation/Active    Problem: Impaired Self-care Mgmt/ADL/IADL  Self Care: Primary Team Goal: pt to complete his UB/LB dressing with mod  I/Active    Add/Update Problems from this assessment:  No updates at this time.    Please review Integrated Patient View Care Plan Flowsheet for Team identified  Problems, Interventions, and Goals.    Signed by: Dorna Bloom, RN 10/14/2013 11:55:00 PM

## 2013-10-14 NOTE — Rehab Progress Note ([facility]) (Signed)
[name]  MRN: [id number]  Account: [id number]  Session Start: 10/14/2013 2:00:00 PM  Session Stop: 10/14/2013 3:00:00 PM    Speech Language Pathology  Inpatient Rehabilitation Progress Note - Brief    Rehab Diagnosis: Acquired brain injury; S/P craniotomy for evacuation of  brain  abscess  Demographics:            Age: 37Y            Gender: Male  Rehabilitation Precautions/Restrictions:   Risk for falls, Aspiration/swallowing, severe processing delays    SUBJECTIVE  Patient Report: "My only goal is to reduce my pain."  Pain: Patient currently without complaints of pain.    OBJECTIVE  General Observations: Awake and cooperative. Flat affect with slow processing  and poor initiation.    Interventions:       Speech Treatment: Functional mobility: Min cues and some extra time for  initiation. Benefits form non-confrontational cues.    Leisure activity - card game (pt's favorite non-work past time): Min-mod cues  overall for appropriate task maintenance and inititaion.  Pain Reassessment: Pain was not reassessed as no pain was reported.    Education Provided:  No education provided this session.    ASSESSMENT  Pt presents with mod-severe cognitive deficits c/b markedly depressed initiation  and slow processing / attention deficits, although initiation does appear to be  improving. A very flact affect continues. Insight into range of deficits is  emerging, but somewhat limited. Buy-in to the rehab process can wax and wane.  (see "S")    PLAN  Continued Speech Language Pathology is recommended to address:  Recommended Frequency/Duration/Intensity: 1:1 and /or group skilled SLP services  for 60-120 minutes for three weeks  Continued Activities Contributing Toward Care Plan: cognitive re-training with  hierarchical cognitive tasks and exercises, training in compensatory memory aids  and strategies, education on executive functions,  pt/family education, collaboration with team for d/c.    3 Hour Rule Minutes: 60  minutes of SLP treatment this session count towards  intensity and duration of therapy requirement. Patient was seen for the full  scheduled time of SLP treatment this session.    Signed by: Lawson Fiscal, M.S., CCC/SLP 10/14/2013 3:00:00 PM

## 2013-10-14 NOTE — Rehab Evaluation ([facility]) (Signed)
[name] Brown  MRN: [id number]  Account: [id number]  Session Start: 10/10/2013 12:00:00 AM  Session Stop: 10/10/2013 12:00:00 AM    Case Management  Inpatient Rehabilitation Initial Assessment    Rehab Diagnosis: Acquired brain injury; S/P craniotomy for evacuation of  brain  abscess  Demographics:            Age: 47Y            Gender: Male    Past Medical History: Diabetes, Multiple episodes of sinus infection and sinus  surgery, Hyperlipidemia, OSA    PAST SURGICAL HISTORY:  As above, plus multiple sinus surgeries and question of resection of  frontal lobe during one of those surgeries.  History of Present Illness: [name] Brown is a 47 y.o.male with extensive P MH of  sinus problems and surgeries admitted [facility] 09/29/13 with AMS and headache after  a recent sinus infection. 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 Transfered to [facility] where he underwent urgent craniotomy for evacuation of  intracerebral MSSA abscess. Other medical issues include uncontrolled DM;  was  on 2 po meds at home,   Date of Onset: 09/30/13   Date of Admission: 10/07/2013 8:46:00 PM    Premorbid Functional Level: The patient reported the premorbid level of function  was independent with mobility and self care.  Understanding of Current Condition: A/O x3, understands information given but  slow to respond. requires repetition due to poor initiation.  Patient/Caregiver Goals:  Patient's functional goals: "to be able to care for  self"  Home Environment: Patient lives in a home environment. Patient lives with  daughter who is (are) able to assist patient at discharge.  Primary Language: English    Demographics: (Source of HX, Pre-Hosp, Marital, Income, Vocation, [place])  Source of History: Patient.  Pre-Hospital Living  Environment: Home.  Marital Status: Married.  Income Source:    Vocational Status: Not Working.  Military Status: Patient's  and/or spouse's military status is unknown.  Family Contact Information:  Primary Contact: [name] Brown  Relationship: daughter  Address:  Primary Number: [phone]  Secondary Number:  POA/Guardian Information:  Patient does not have a guardian.  Financial Concerns: none reported    Special Needs: None.  Observed Behaviors:  Confused.  Psychosocial History:  Adjustment to Present Illness:  Patient is coping adequately.   Patient is accepting limitations adequately.   Patient's expectations are realistic.   Patient is motivated.  Patient Perceived Primary Stressors:  Primary MD Contact: None.    Home Care/Long Term Care Policy: None.    Interdisciplinary Educational Needs and Learning Preferences:       Learning Preference: The patient's preferred learning method is:  Explanation.       Barriers to Learning: No barriers.       Learning Needs: None.    Education Provided: No education provided this session.    Rehab Potential: Able to participate in an intensive inpatient interdisciplinary  rehabilitation program, Good family/social support, Good premorbid functional  status, Good premorbid medical status, Living in the community premorbidly,  Motivated  Barriers to Progress/Discharge: No potential barriers to progress.    Other/Additional Findings: CM met with Patient and explained acute rehab process  and role of case management.  Team Conference held Wednesdays to review  functional gains, discharge needs and barriers.  Daughter and family supportive;  social support seems adequate.  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   No unmet spiritual needs.  No reported financial stressors.  Plan for care at  discharge includes home with home health care services; DME needs to be  determined; likely will need IV abx.  No previous home health, outpatient or SNF  care; no preference for agency.  Provided emotional support.  Will coordinate  care and complete utilization review requirements per payor/health  insurance  providers.    Medicare Important Message: The Medicare Important Message letter was not  issued. not Medicare eligible    Care Plan  Identified problems from team documentation:  Problem: Impaired Cognition  Cognition: Primary Team Goal: Pt will utilize external memory aids to follow  daily schedule, recall recent/ongoing events, precautions, medications  and  learn compensatory strategies with occasional cues/support./Active    Problem: Impaired Mobility  Mobility: Primary Team Goal: Pt. will be able to transfer and ambulate >150 feet  with supervision to return to current living situation in hotel setting. [name] Brown    Problem: Impaired Psychosocial Skills/Behavior  PsychoSocial: Primary Team Goal: Pt will demonstrate coping skills for adjusting  to medical situation and rehab hospitalization as reflected in a positive  attitude in his therapy participation/Active    Problem: Impaired Self-care Mgmt/ADL/IADL  Self Care: Primary Team Goal: pt to complete his UB/LB dressing with mod  I/Active    Identified problems from this assessment:     No problems identified at this time.    Please review Integrated Patient View Care Plan Flowsheet for Team identified  Problems, Interventions, and Goals.    Signed by: Donnal Moat, LCSW 10/10/2013 1:41:00 PM

## 2013-10-15 LAB — GLUCOSE WHOLE BLOOD - POCT
Whole Blood Glucose POCT: 128 mg/dL — ABNORMAL HIGH (ref 70–100)
Whole Blood Glucose POCT: 139 mg/dL — ABNORMAL HIGH (ref 70–100)
Whole Blood Glucose POCT: 121 mg/dL — ABNORMAL HIGH (ref 70–100)
Whole Blood Glucose POCT: 130 mg/dL — ABNORMAL HIGH (ref 70–100)

## 2013-10-15 NOTE — Rehab Progress Note ([facility]) (Signed)
[name]  MRN: [id number]  Account: [id number]  Session Start: 10/15/2013 12:00:00 AM  Session Stop: 10/15/2013 12:00:00 AM    SEVERE SEPSIS SCREEN  INFECTION:  Patient is on antibiotic therapy (not prophylaxis).    SYSTEMATIC IMFLAMMATORY RESPONSE SYNDROME (SIRS):  Patient  has no indication of  systematic inflammatory response syndrome (SIRS). Negative Sepsis Screen.  If you are unable to assess a system's dysfunction because you do not have labs,  or the labs you have are not current (within 24 hours), call physician and  request and order for the lab tests needed.    Signed by: Jerene Dilling, RN 10/15/2013 8:10:00 AM

## 2013-10-15 NOTE — Rehab Progress Note ([facility]) (Signed)
[name] Brown  MRN: [id number]  Account: [id number]  Session Start: 10/15/2013 12:00:00 AM  Session Stop: 10/15/2013 12:00:00 AM    Rehabilitation Nursing  Inpatient Rehabilitation Shift Assessment    Rehab Diagnosis: Acquired brain injury; S/P craniotomy for evacuation of  brain  abscess  Demographics:            Age: 47Y            Gender: Male  Primary Language: English    Date of Onset:  09/30/13  Date of Admission: 10/07/2013 8:46:00 PM    Rehabilitation Precautions Restrictions:   Risk for falls, Aspiration/swallowing, severe processing delays    Patient Report: "My head hurts."  Pain: Patient currently has pain.  Location: head  Type: Acute  Quality: Aching.  Pain Scale: Numeric.  Patient reports a pain level of 4 out of 10.  Patient's acceptable level of pain 4 out of 10.   Interferes with physical activity.  Pain is alleviated by: medication and time  Pain is exacerbated by: unknown Patient medicated.  Pain Reassessment:  Response to Pain Intervention: pain reduced  Post Intervention Pain Quality:  Aching.  Patient Reports Post Intervention Pain Level of: 4 out of 10  Pain Acceptable: Yes  Patient/Caregiver Goals:  "to be able to care for self"    NEURO  Orientation/Awareness: Alert and Oriented x3.  Speech: No deficits noted at this time.  Behavior: Cooperative.   has flat effect    MEDICATIONS  IV Access: Patient has IV access.     Type: Double Lumen PICC  Location: RUE Length of line:  Care Provided:   Flushed after medication with 5ml normal saline followed by 2.59ml (100u heparin  /ml). Using SASH method. Date Inserted:  Dialysis Access: Patient does not have dialysis access.      Elopement Risk Level Assessment Tool  Patient Criteria: Patient is not capable of leaving the unit.  Assessment is not  applicable.      RISK ASSESSMENT FOR FALLS/INJURY    MENTAL STATUS CRITERIA:   10 - Impaired memory.   MENTAL STATUS TOTAL: 10    AGE CRITERIA:   47 - < 63 years old  AGE TOTAL: 0    ELIMINATION CRITERIA:    3 - Toileting with Assistance.   ELIMINATION TOTAL: 3    HISTORY OF FALLS CRITERIA:   2 - Unknown History.  HISTORY OF FALLS TOTAL: 2    MEDICATIONS CRITERIA:   2 or more High Risk Medications (*see list below)   MEDICATIONS TOTAL: 2    PHYSICAL MOBILITY CRITERIA:   3 - Decreased balance reaction.   1 - Weakness/impaired physical mobility.   PHYSICAL MOBILITY TOTAL: 4    FALLS RISK ASSESSMENT TOTAL: 21    Patient's Fall Risk: TOTAL SCORE >10: High Risk    Falls Interventions: Clutter removed and clear path to BR.  Call bell, phone, glasses, etc within reach.  Hourly toileting/safety checks between 6am and 10pm, then every 2 hours.  Initiate Fall care plan and outcome.  Yellow "high risk" patient identification in place: wrist band, socks, chart  sticker, door sign.  Pt and family education.  Low bed with mat  Bed alarm    NUTRITION  Diet:  Type: Consistent carbohydrate.  Food Consistency: Regular.  Liquid Consistency: Thin.   fluid restriction 1568ml/day      CARDIOVASCULAR     Bilateral lower extremities  Nail Bed Color: Pink.   No edema or redness present.  Pulses:  Apical Pulse: Regular. Strong. Rate is 76 .   Patient does not have a pacemaker.   Patient does not have a defibrillator.    CARDIOPULMONARY  Lung Sounds:   Upper lobes. Clear.   Lower lobes. Clear.  Type of Respirations: Regular.  Cough: No cough noted.  Respiratory Support: The patient does not require any respiratory support.  Respiratory Equipment: None. O2 stat 97% on room air    INTEGUMENTARY  Skin:  Temperature: Warm  Turgor: Normal for age  Moisture: Dry  Color of skin: Normal for Race/Ethnicity  Capillary Refill: Less than 3 seconds   Bruising: abdomen  Wounds/Incisions:     Surgical Incision: Cranial incision. Length: 32 centimeter(s) with glue. No  signs of infection.  Drainage: Incision without drainage.  Odor:  No  Incision Care: Incision healing       wound with staples on the back of his head, staples removed  Braden Scale for  Predicting Pressure Sore Risk: Sensory Perception: No  impairment  Moisture: Rarely moist  Activity: Walks occasionally  Mobility: Slightly limited  Nutrition: Adequate  Friction and Shear: No apparent problem  Braden Score: 20  Level of Risk: No risk (19-23). Will reassess every shift.    GASTROINTESTINAL  Abdomen: Soft. Nontender.  Bowel Sounds:  Active bowel sounds audible in all four quadrants.  Date of Last Bowel Movement:  12-18   No Problems/Complaints with Bowel Elimination Assessed.    GENITOURINARY  Current Bladder Pattern: Continent  Color:  Yellow   Patient denies problems with urination and/or catheter.    MUSCULOSKELETAL  Upper Body: Generalized weakness  Lower Body: Gait abnromality and Generalized weakness    Functional Measures      BLADDER MANAGEMENT - LEVEL OF ASSIST: Bladder Score = 7. Patient is completely  independent for bladder management. There are no activity limitations.    BLADDER ACCIDENTS THIS SHIFT:  0 . Patient has not had an accident this  assessment and did not require medications or devices.    BOWEL MANAGEMENT - LEVEL OF ASSIST: Bowel Score = 6.  Patient is modified  independent for bowel management.  Patient did not have bowel movement.  Medication/intervention was provided.    BOWEL ACCIDENTS THIS SHIFT: 0 . Patient has not had an accident, but used a  stool softener.  EATING: Eating Score = 7. Patient is completely independent for eating.  There  are no activity limitations.    Education Provided:    Education Provided: Precautions. Pain management. Pain scale. Medication  options. Medication. Name and dosage. Administration. Purpose. Administration of  Lovenox injections.       Audience: Patient.       Mode: Explanation.       Response: Applied knowledge.  Verbalized understanding.  Needs reinforcement.    Discharge: Patient is not being discharged at this time.    Long Term Goals: 3 weeks  Short Term Goals: Pt will consistently use call light for assistance prior to  getting  OOB/toilet to prevent fall. - Goal Not Met  Pt will report pain control at/or<3/10 prior to  and during therapy session with  medications and alternative. - Goal Met  Pt will report any s/s of incision/wound infection in order to receive timely  medical intervention.  7 to 10 days from 10/07/13    PROGRESS TOWARD GOALS: pt is making progress owaard goals    PLAN: Nursing Specific Interventions  Diabetic Management. Medical Condition Management. Medication Management. Pain  Management. Skin Management. Wound  Management.  Continue with the current Nursing Plan of Care.    TEAM CARE PLAN  Identified problems from team documentation:  Problem: Impaired Cognition  Cognition: Primary Team Goal: Pt will utilize external memory aids to follow  daily schedule, recall recent/ongoing events, precautions, medications  and  learn compensatory strategies with occasional cues/support./Active    Problem: Impaired Mobility  Mobility: Primary Team Goal: Pt. will be able to transfer and ambulate >150 feet  with supervision to return to current living situation in hotel setting. [name]    Problem: Impaired Psychosocial Skills/Behavior  PsychoSocial: Primary Team Goal: Pt will demonstrate coping skills for adjusting  to medical situation and rehab hospitalization as reflected in a positive  attitude in his therapy participation/Active    Problem: Impaired Self-care Mgmt/ADL/IADL  Self Care: Primary Team Goal: pt to complete his UB/LB dressing with mod  I/Active    Add/Update Problems from this assessment:  No updates at this time.    Please review Integrated Patient View Care Plan Flowsheet for Team identified  Problems, Interventions, and Goals.    Signed by: Jerene Dilling, RN 10/15/2013 1:56:00 PM

## 2013-10-15 NOTE — Progress Notes (Signed)
PROGRESS NOTE  PHYSICAL MEDICINE AND REHABILITATION      Date Time: 10/15/2013 1:39 PM  Patient Name: [facility]    Admission date:  10/07/2013        Assessment:   Continue with acute inpatient rehabilitation with close medical supervision     Patient Active Problem List   Diagnosis   . Brain abscess   . DM (diabetes mellitus)   . Encephalopathy       Plan:   Continue PT, OT in acute rehabilitation medical setting.  Remove couple staples left side of scalp.  Spoke with RN.    Subjective:   No new complaints.  No new concerns with pain or sleep.    Medications:      Scheduled Meds: PRN Meds:         cefTRIAXone 2 g Intravenous Q12H SCH   docusate sodium 100 mg Oral BID   enoxaparin 40 mg Subcutaneous Daily   famotidine 20 mg Oral Q12H SCH   gabapentin 200 mg Oral Q8H SCH   insulin glargine 38 Units Subcutaneous QAM   metFORMIN 1,000 mg Oral BID Meals   oxyCODONE 10 mg Oral Q8H SCH   polyethylene glycol 17 g Oral Daily   Senna 17.2 mg Oral QHS   simvastatin 40 mg Oral Daily with dinner       Continuous Infusions:         acetaminophen 650 mg Q6H PRN   alum & mag hydroxide-simethicone 30 mL Q6H PRN   bisacodyl 10 mg QD PRN   butalbital-acetaminophen-caffeine 2 tablet Q6H PRN   chlorproMAZINE 25 mg TID PRN   dextrose 15 g PRN   dextrose 25 mL PRN   glucagon (rDNA) 1 mg PRN   insulin aspart 1-12 Units PRN   ondansetron 4 mg Q8H PRN   oxyCODONE 15 mg Q3H PRN   oxyCODONE 7.5 mg Q3H PRN   saline 2 spray PRN   zolpidem 2.5 mg QHS PRN                 Review of Systems:   No new issues overnight.  No new pain.    Physical Exam:     Filed Vitals:    10/13/13 2020 10/14/13 0528 10/14/13 2124 10/15/13 0638   BP: 99/53 110/62 123/72 115/72   Pulse: 84 66 83 76   Temp: 98.6 F (37 C) 97.3 F (36.3 C) 97.7 F (36.5 C) 97 F (36.1 C)   TempSrc: Oral Axillary     Resp: 17 16 16 20    Weight:       SpO2: 96% 95% 97% 97%       Intake and Output Summary (Last 24 hours) at Date Time  No intake or output data in the 24 hours ending  10/15/13 1339  P.O.: 600 mL (10/10/13 2000)        Bladder Scan Volume (mL): 749 mL (10/09/13 0130)  Intermittent/Straight Cath (mL):  (pt refused to be cathed) (10/09/13 0130)         Cardiac: regular rhythm  Chest / Lungs:  Clear to auscultation.  Abdomen:  + bowel sounds, Soft, non-tender, non-distended.  Extremities: no calf tenderness.      Labs:   No results found for this basename: GLUCOSEWHOLE:10 in the last 24 hours      Lab 10/10/13 1408   WBC 12.33*   HGB 14.6   HCT 42.8   PLT 241          Lab 10/10/13  1408   NA 135*   K 4.5   CL 97*   CO2 24   BUN 19   CREAT 0.7   CA 9.2   ALB 3.5   PROT 6.8   BILITOTAL 0.5   ALKPHOS 80   ALT 67*   AST 35*   GLU 231*   MG --   PHOS --       No results found for this basename: INR:4,APTT:4 in the last 168 hours    Results     Procedure Component Value Units Date/Time    Glucose Whole Blood - POCT [[id number]]  (Abnormal) Collected:10/15/13 1206     POCT - Glucose Whole blood 139 (H) mg/dL ZDGUYQI:34/74/25 9563    Glucose Whole Blood - POCT [[id number]]  (Abnormal) Collected:10/15/13 0622     POCT - Glucose Whole blood 128 (H) mg/dL JJOACZY:60/63/01 6010    Glucose Whole Blood - POCT [[id number]]  (Abnormal) Collected:10/14/13 2136     POCT - Glucose Whole blood 141 (H) mg/dL KGURKYH:04/19/75 2831    Glucose Whole Blood - POCT [[id number]]  (Abnormal) Collected:10/14/13 1724     POCT - Glucose Whole blood 139 (H) mg/dL XTGGYIR:48/54/62 7035                 Rads:   Radiological Procedure reviewed.  Radiology Results (24 Hour)     ** No Results found for the last 24 hours. **          Signed by: Virl Cagey MD    [facility]

## 2013-10-16 LAB — GLUCOSE WHOLE BLOOD - POCT
Whole Blood Glucose POCT: 112 mg/dL — ABNORMAL HIGH (ref 70–100)
Whole Blood Glucose POCT: 144 mg/dL — ABNORMAL HIGH (ref 70–100)
Whole Blood Glucose POCT: 125 mg/dL — ABNORMAL HIGH (ref 70–100)
Whole Blood Glucose POCT: 155 mg/dL — ABNORMAL HIGH (ref 70–100)

## 2013-10-16 NOTE — Rehab Progress Note ([facility]) (Signed)
NAMESASCHA BAUGHER  MRN: [id number]  Account: [id number]  Session Start: 10/16/2013 12:00:00 AM  Session Stop: 10/16/2013 12:00:00 AM    SEVERE SEPSIS SCREEN  INFECTION:  Patient is on antibiotic therapy (not prophylaxis).    SYSTEMATIC IMFLAMMATORY RESPONSE SYNDROME (SIRS):  Patient  has no indication of  systematic inflammatory response syndrome (SIRS). Negative Sepsis Screen.  If you are unable to assess a system's dysfunction because you do not have labs,  or the labs you have are not current (within 24 hours), call physician and  request and order for the lab tests needed.    Signed by: Jerene Dilling, RN 10/16/2013 8:05:00 AM

## 2013-10-16 NOTE — Rehab Progress Note ([facility]) (Signed)
[name] Brown  MRN: [id number]  Account: [id number]  Session Start: 10/15/2013 12:00:00 AM  Session Stop: 10/15/2013 12:00:00 AM    Rehabilitation Nursing  Inpatient Rehabilitation Shift Assessment    Rehab Diagnosis: Acquired brain injury; S/P craniotomy for evacuation of  brain  abscess  Demographics:            Age: 47Y            Gender: Male  Primary Language: English    Date of Onset:  09/30/13  Date of Admission: 10/07/2013 8:46:00 PM    Rehabilitation Precautions Restrictions:   Risk for falls, Aspiration/swallowing, severe processing delays    Patient Report: "My head hurts"  Pain: Patient currently has pain.  Location: head  Type: Acute  Quality: Aching.  Pain Scale: Numeric.  Patient reports a pain level of 5 out of 10.  Patient's acceptable level of pain 0 out of 10.   Interferes with sleep. physical activity.  Pain is alleviated by: pain medication  Pain is exacerbated by: bright lights, noise, and movements Patient medicated.  Applied ice.  Pain Reassessment:  Response to Pain Intervention: reports relief  Post Intervention Pain Quality:  Tender.  Patient Reports Post Intervention Pain Level of: 2 out of 10  Pain Acceptable: Yes  Patient/Caregiver Goals:  "to be able to care for self"    NEURO  Orientation/Awareness: Alert and Oriented x3.  Speech: No deficits noted at this time.  Behavior: Cooperative.  Impulsive.  Poor Judgment.    MEDICATIONS  IV Access: Patient has IV access.     Type: Double Lumen PICC  Location: RUE Length of line:  Care Provided:   Flushed after medication with 5ml normal saline followed by 2.34ml (100u heparin  /ml). Using SASH method. Date Inserted:  Dialysis Access: Patient does not have dialysis access.      Elopement Risk Level Assessment Tool  Patient Criteria:  CATEGORY 1 CRITERIA:   No category 1 criteria. Elopement Risk:  No      RISK ASSESSMENT FOR FALLS/INJURY    MENTAL STATUS CRITERIA:   10 - Impaired Judgement/impulsive.   MENTAL STATUS TOTAL: 10    AGE CRITERIA:    8 - < 48 years old  AGE TOTAL: 0    ELIMINATION CRITERIA:   3 - Toileting with Assistance.   3 - Toileting with Assistance.   ELIMINATION TOTAL: 3    HISTORY OF FALLS CRITERIA:   2 - Unknown History.  HISTORY OF FALLS TOTAL: 2    MEDICATIONS CRITERIA:   2 or more High Risk Medications (*see list below)   MEDICATIONS TOTAL: 2    PHYSICAL MOBILITY CRITERIA:   3 - Decreased balance reaction.   1 - Weakness/impaired physical mobility.   PHYSICAL MOBILITY TOTAL: 4    FALLS RISK ASSESSMENT TOTAL: 21    Patient's Fall Risk: TOTAL SCORE >10: High Risk    Falls Interventions: Clutter removed and clear path to BR.  Call bell, phone, glasses, etc within reach.  Hourly toileting/safety checks between 6am and 10pm, then every 2 hours.  Initiate Fall care plan and outcome.  Yellow "high risk" patient identification in place: wrist band, socks, chart  sticker, door sign.  Pt and family education.  Assistive devices at [facility].  Pharmacy review of meds.  Bed alarm    NUTRITION  Diet:  Type: Consistent carbohydrate.  Food Consistency: Regular.  Liquid Consistency: Thin.      CARDIOVASCULAR     Bilateral lower extremities  Nail Bed Color: Pink.   No edema or redness present.  Pulses:   Apical Pulse: Regular. Strong. Rate is 74 .   Patient does not have a pacemaker.   Patient does not have a defibrillator.    CARDIOPULMONARY  Lung Sounds:   Upper lobes. Clear.   Lower lobes. Clear.  Type of Respirations: Regular.  Cough: No cough noted.  Respiratory Support: The patient does not require any respiratory support.  Respiratory Equipment: 98%    INTEGUMENTARY  Skin:  Temperature: Warm  Turgor: Normal for age  Moisture: Dry  Color of skin: Normal for Race/Ethnicity  Capillary Refill: Less than 3 seconds  Wounds/Incisions:     Surgical Incision: Cranial incision. Length: 32 centimeter(s) healed. No signs  of infection.  Drainage: Incision without drainage.  Odor:  No  Incision Care: Incision healing  Braden Scale for Predicting Pressure Sore  Risk: Sensory Perception: No  impairment  Moisture: Rarely moist  Activity: Walks occasionally  Mobility: Slightly limited  Nutrition: Adequate  Friction and Shear: Potential problem  Braden Score: 19  Level of Risk: No risk (19-23). Will reassess every shift.    GASTROINTESTINAL  Abdomen: Soft. Nontender.  Bowel Sounds:  Active bowel sounds audible in all four quadrants.  Date of Last Bowel Movement:  10/15/13   No Problems/Complaints with Bowel Elimination Assessed.    GENITOURINARY  Current Bladder Pattern: Continent  Color:  Yellow   Patient denies problems with urination and/or catheter.    MUSCULOSKELETAL  Upper Body: Generalized weakness  Lower Body: Gait abnromality and Generalized weakness    Functional Measures      TOILETING: Toileting Score = 5.  Patient is supervision/set-up for toileting,  requiring: Stand by assistance.  Patient requires the following assistive device(s):  Adaptive device to maintain  balance.  Grab bar.    BLADDER MANAGEMENT - LEVEL OF ASSIST: Bladder Score = 7. Patient is completely  independent for bladder management. There are no activity limitations.    BLADDER ACCIDENTS THIS SHIFT:  0 . Patient has not had an accident this  assessment and did not require medications or devices.    BOWEL MANAGEMENT - LEVEL OF ASSIST: Bowel Score = 6.  Patient is modified  independent for bowel management.  Patient did not have bowel movement.  Medication/intervention was provided.    BOWEL ACCIDENTS THIS SHIFT: 0 . Patient has not had an accident, but used a  stool softener.    Education Provided:    Education Provided: Precautions. Pain management. Pain scale. Medication  options. Safety issues and interventions. Medication. Name and dosage.  Administration. Purpose.       Audience: Patient.       Mode: Explanation.       Response: Needs reinforcement.    Discharge: Patient is not being discharged at this time.    Long Term Goals: 3 weeks  Short Term Goals: Pt will consistently use call light  for assistance prior to  getting OOB/toilet to prevent fall. - Goal Not Met  Pt will report pain control at/or<3/10 prior to  and during therapy session with  medications and alternative. - Goal Met  Pt will report any s/s of incision/wound infection in order to receive timely  medical intervention.  7 to 10 days from 10/07/13    PROGRESS TOWARD GOALS: Pt c/o pain 5/10 and Oxy ir 15mg  PRN given. Pt continues  to require cues to use call bell  before getting OOB    PLAN: Nursing Specific  Interventions  Diabetic Management. Medical Condition Management. Medication Management. Pain  Management. Skin Management. Wound Management.  Continue with the current Nursing Plan of Care.    TEAM CARE PLAN  Identified problems from team documentation:  Problem: Impaired Cognition  Cognition: Primary Team Goal: Pt will utilize external memory aids to follow  daily schedule, recall recent/ongoing events, precautions, medications  and  learn compensatory strategies with occasional cues/support./Active    Problem: Impaired Mobility  Mobility: Primary Team Goal: Pt. will be able to transfer and ambulate >150 feet  with supervision to return to current living situation in hotel setting. [name]    Problem: Impaired Psychosocial Skills/Behavior  PsychoSocial: Primary Team Goal: Pt will demonstrate coping skills for adjusting  to medical situation and rehab hospitalization as reflected in a positive  attitude in his therapy participation/Active    Problem: Impaired Self-care Mgmt/ADL/IADL  Self Care: Primary Team Goal: pt to complete his UB/LB dressing with mod  I/Active    Add/Update Problems from this assessment:  No updates at this time.    Please review Integrated Patient View Care Plan Flowsheet for Team identified  Problems, Interventions, and Goals.    Signed by: Gretta Arab, RN 10/15/2013 11:15:00 PM

## 2013-10-16 NOTE — Rehab Progress Note ([facility]) (Signed)
[name]  MRN: [id number]  Account: [id number]  Session Start: 10/16/2013 12:00:00 AM  Session Stop: 10/16/2013 12:00:00 AM    Rehabilitation Nursing  Inpatient Rehabilitation Shift Assessment    Rehab Diagnosis: Acquired brain injury; S/P craniotomy for evacuation of  brain  abscess  Demographics:            Age: 47Y            Gender: Male  Primary Language: English    Date of Onset:  09/30/13  Date of Admission: 10/07/2013 8:46:00 PM    Rehabilitation Precautions Restrictions:   Risk for falls, Aspiration/swallowing, severe processing delays    Patient Report: "I have a headache again."  Pain: Patient currently has pain.  Location: head  Type: Acute  Quality: Aching.  Pain Scale: Numeric.  Patient reports a pain level of 3 out of 10.  Patient's acceptable level of pain 4 out of 10.   Interferes with physical activity.  Pain is alleviated by: medication  Pain is exacerbated by: light and time Patient medicated.  Pain Reassessment:  Response to Pain Intervention: it got a little better after medication  Post Intervention Pain Quality:  Aching.  Patient Reports Post Intervention Pain Level of: 3 out of 10  Pain Acceptable: Yes  Patient/Caregiver Goals:  "to be able to care for self"    NEURO  Orientation/Awareness: Alert and Oriented x3.  Speech: No deficits noted at this time.  Behavior: Cooperative.  Poor Judgment.    MEDICATIONS  IV Access: Patient has IV access.     Type: Double Lumen PICC  Location: Right upper arm Length of line:  Care Provided:   Flushed after medication with 5ml normal saline followed by 2.44ml (100u heparin  /ml). Using SASH method. Date Inserted:  Dialysis Access: Patient does not have dialysis access.      Elopement Risk Level Assessment Tool  Patient Criteria:  CATEGORY 1 CRITERIA:   No category 1 criteria. Elopement Risk:  No      RISK ASSESSMENT FOR FALLS/INJURY    MENTAL STATUS CRITERIA:   10 - Impaired Judgement/impulsive.   MENTAL STATUS TOTAL: 10    AGE CRITERIA:   24 - < 77  years old  AGE TOTAL: 0    ELIMINATION CRITERIA:   3 - Toileting with Assistance.   ELIMINATION TOTAL: 3    HISTORY OF FALLS CRITERIA:   2 - Unknown History.  HISTORY OF FALLS TOTAL: 2    MEDICATIONS CRITERIA:   2 or more High Risk Medications (*see list below)   MEDICATIONS TOTAL: 2    PHYSICAL MOBILITY CRITERIA:   3 - Decreased balance reaction.   1 - Weakness/impaired physical mobility.   PHYSICAL MOBILITY TOTAL: 4    FALLS RISK ASSESSMENT TOTAL: 21    Patient's Fall Risk: TOTAL SCORE >10: High Risk    Falls Interventions: Clutter removed and clear path to BR.  Call bell, phone, glasses, etc within reach.  Hourly toileting/safety checks between 6am and 10pm, then every 2 hours.  Initiate Fall care plan and outcome.  Yellow "high risk" patient identification in place: wrist band, socks, chart  sticker, door sign.  Pt and family education.  Family at bedside.  Low bed with mat  Bed alarm    NUTRITION  Diet:  Type: Consistent carbohydrate.  Food Consistency: Regular.  Liquid Consistency: Thin.      CARDIOVASCULAR     Bilateral lower extremities  Nail Bed Color: Pink.   No  edema or redness present.  Pulses:   Apical Pulse: Regular. Strong. Rate is 61 .   Patient does not have a pacemaker.   Patient does not have a defibrillator.    CARDIOPULMONARY  Lung Sounds:   Upper lobes. Clear.  Type of Respirations: Regular. Regular.  Cough: No cough noted.  Respiratory Support: The patient does not require any respiratory support.  Respiratory Equipment: None. O2 stat 97% on room air    INTEGUMENTARY  Skin:  Temperature: Warm  Turgor: Normal for age  Moisture: Dry  Color of skin: Normal for Race/Ethnicity  Capillary Refill: Less than 3 seconds  Wounds/Incisions:     Surgical Incision: Cranial incision. Length: 32 centimeter(s) healed. No signs  of infection.  Drainage: Incision without drainage.  Odor:  No  Incision Care: Incision healing  Braden Scale for Predicting Pressure Sore Risk: Sensory Perception:  No  impairment  Moisture: Rarely moist  Activity: Walks occasionally  Mobility: Slightly limited  Nutrition: Adequate  Friction and Shear: Potential problem  Braden Score: 19  Level of Risk: No risk (19-23). Will reassess every shift.    GASTROINTESTINAL  Abdomen: Soft. Nontender.  Bowel Sounds:  Active bowel sounds audible in all four quadrants.  Date of Last Bowel Movement:  10-15-13   No Problems/Complaints with Bowel Elimination Assessed.    GENITOURINARY  Current Bladder Pattern: Continent  Color:  Yellow   Patient denies problems with urination and/or catheter.    MUSCULOSKELETAL  Upper Body: Generalized weakness  Lower Body: Gait abnromality and Generalized weakness    Functional Measures    EATING: Eating Score = 7. Patient is completely independent for eating.  There  are no activity limitations.    BLADDER MANAGEMENT - LEVEL OF ASSIST: Bladder Score = 7. Patient is completely  independent for bladder management. There are no activity limitations.    BLADDER ACCIDENTS THIS SHIFT:  0 . Patient has not had an accident this  assessment and did not require medications or devices.    BOWEL MANAGEMENT - LEVEL OF ASSIST: Bowel Score = 6.  Patient is modified  independent for bowel management.  Patient did not have bowel movement.  Medication/intervention was provided.    BOWEL ACCIDENTS THIS SHIFT: 0 . Patient has not had an accident, but used a  stool softener.    Education Provided:    Education Provided: Precautions. Pain management. Pain scale. Medication  options. Nutrition/feeding. Dietary recommendations. Fluid restrictions.  Activities of daily living. Bed mobility. Medication. Name and dosage.  Administration. Purpose. Side Effects. Administration of Lovenox injections.       Audience: Patient.       Mode: Explanation.  Demonstration.       Response: Applied knowledge.  Verbalized understanding.  Needs reinforcement.    Discharge: Patient is not being discharged at this time.    Long Term Goals: 3  weeks  Short Term Goals: Pt will consistently use call light for assistance prior to  getting OOB/toilet to prevent fall. - Goal Not Met  Pt will report pain control at/or<3/10 prior to  and during therapy session with  medications and alternative. - Goal Met  Pt will report any s/s of incision/wound infection in order to receive timely  medical intervention.  7 to 10 days from 10/07/13    PROGRESS TOWARD GOALS: pt is making progress toward goals. pt did not get out of  bed without staff and he did not use his call light    PLAN: Nursing Specific  Interventions  Diabetic Management. Medical Condition Management. Medication Management. Pain  Management. Skin Management. Wound Management.  Continue with the current Nursing Plan of Care.    TEAM CARE PLAN  Identified problems from team documentation:  Problem: Impaired Cognition  Cognition: Primary Team Goal: Pt will utilize external memory aids to follow  daily schedule, recall recent/ongoing events, precautions, medications  and  learn compensatory strategies with occasional cues/support./Active    Problem: Impaired Mobility  Mobility: Primary Team Goal: Pt. will be able to transfer and ambulate >150 feet  with supervision to return to current living situation in hotel setting. [name]    Problem: Impaired Psychosocial Skills/Behavior  PsychoSocial: Primary Team Goal: Pt will demonstrate coping skills for adjusting  to medical situation and rehab hospitalization as reflected in a positive  attitude in his therapy participation/Active    Problem: Impaired Self-care Mgmt/ADL/IADL  Self Care: Primary Team Goal: pt to complete his UB/LB dressing with mod  I/Active    Add/Update Problems from this assessment:  No updates at this time.    Please review Integrated Patient View Care Plan Flowsheet for Team identified  Problems, Interventions, and Goals.    Signed by: Jerene Dilling, RN 10/16/2013 3:51:00 PM

## 2013-10-16 NOTE — Rehab Progress Note ([facility]) (Signed)
[name]  MRN: [id number]  Account: [id number]  Session Start: 10/15/2013 12:00:00 AM  Session Stop: 10/15/2013 12:00:00 AM    SEVERE SEPSIS SCREEN  INFECTION:  Patient has documented infection. Is on antibiotic therapy (not  prophylaxis).    SYSTEMATIC IMFLAMMATORY RESPONSE SYNDROME (SIRS):  Patient  has no indication of  systematic inflammatory response syndrome (SIRS). Negative Sepsis Screen.  If you are unable to assess a system's dysfunction because you do not have labs,  or the labs you have are not current (within 24 hours), call physician and  request and order for the lab tests needed.    Signed by: Gretta Arab, RN 10/15/2013 8:40:00 PM

## 2013-10-16 NOTE — Progress Notes (Signed)
PROGRESS NOTE  PHYSICAL MEDICINE AND REHABILITATION      Date Time: 10/16/2013 2:42 PM  Patient Name: [name] Brown    Admission date:  10/07/2013        Assessment:     Medically stable to continue with acute inpatient rehab    Patient Active Problem List   Diagnosis   . Brain abscess   . DM (diabetes mellitus)   . Encephalopathy       Plan:     Continue PT, OT in acute rehabilitation medical setting.    Subjective:   No new issues overnite.  No new pain.    Medications:      Scheduled Meds: PRN Meds:         cefTRIAXone 2 g Intravenous Q12H SCH   docusate sodium 100 mg Oral BID   enoxaparin 40 mg Subcutaneous Daily   famotidine 20 mg Oral Q12H SCH   gabapentin 200 mg Oral Q8H SCH   insulin glargine 38 Units Subcutaneous QAM   metFORMIN 1,000 mg Oral BID Meals   oxyCODONE 10 mg Oral Q8H SCH   polyethylene glycol 17 g Oral Daily   Senna 17.2 mg Oral QHS   simvastatin 40 mg Oral Daily with dinner       Continuous Infusions:         acetaminophen 650 mg Q6H PRN   alum & mag hydroxide-simethicone 30 mL Q6H PRN   bisacodyl 10 mg QD PRN   butalbital-acetaminophen-caffeine 2 tablet Q6H PRN   chlorproMAZINE 25 mg TID PRN   dextrose 15 g PRN   dextrose 25 mL PRN   glucagon (rDNA) 1 mg PRN   insulin aspart 1-12 Units PRN   ondansetron 4 mg Q8H PRN   oxyCODONE 15 mg Q3H PRN   oxyCODONE 7.5 mg Q3H PRN   saline 2 spray PRN   zolpidem 2.5 mg QHS PRN                 Review of Systems:   No new problems with pain or sleep.    Physical Exam:     Filed Vitals:    10/14/13 2124 10/15/13 0638 10/15/13 2006 10/16/13 0610   BP: 123/72 115/72 113/71 105/54   Pulse: 83 76 74 61   Temp: 97.7 F (36.5 C) 97 F (36.1 C) 97.5 F (36.4 C) 96.4 F (35.8 C)   TempSrc:       Resp: 16 20 16 20    Weight:       SpO2: 97% 97% 98% 97%       Intake and Output Summary (Last 24 hours) at Date Time  No intake or output data in the 24 hours ending 10/16/13 1442  P.O.: 200 mL (10/15/13 0920)        Bladder Scan Volume (mL): 749 mL (10/09/13  0130)  Intermittent/Straight Cath (mL):  (pt refused to be cathed) (10/09/13 0130)         Cardiac: regular rhythm  Chest / Lungs:  Clear to auscultation.  Abdomen:  + bowel sounds, Soft, non-tender, non-distended.  Extremities: no calf tenderness.      Labs:   No results found for this basename: GLUCOSEWHOLE:10 in the last 24 hours      Lab 10/10/13 1408   WBC 12.33*   HGB 14.6   HCT 42.8   PLT 241          Lab 10/10/13 1408   NA 135*   K 4.5   CL 97*  CO2 24   BUN 19   CREAT 0.7   CA 9.2   ALB 3.5   PROT 6.8   BILITOTAL 0.5   ALKPHOS 80   ALT 67*   AST 35*   GLU 231*   MG --   PHOS --       No results found for this basename: INR:4,APTT:4,BNP:4 in the last 168 hours       No results found for this basename: CK:3,TROPI:3,TROPT:3,CKMBINDEX:3 in the last 168 hours     Results     Procedure Component Value Units Date/Time    Glucose Whole Blood - POCT [[id number]]  (Abnormal) Collected:10/16/13 1119     POCT - Glucose Whole blood 144 (H) mg/dL WUJWJXB:14/78/29 5621    Glucose Whole Blood - POCT [[id number]]  (Abnormal) Collected:10/16/13 0628     POCT - Glucose Whole blood 112 (H) mg/dL NGEXBMW:41/32/44 0102    Glucose Whole Blood - POCT [[id number]]  (Abnormal) Collected:10/15/13 2112     POCT - Glucose Whole blood 130 (H) mg/dL HKVQQVZ:56/38/75 6433    Glucose Whole Blood - POCT [[id number]]  (Abnormal) Collected:10/15/13 1636     POCT - Glucose Whole blood 121 (H) mg/dL SAYTKZS:11/04/30 3557              Rads:   Radiological Procedure reviewed.  Radiology Results (24 Hour)     ** No Results found for the last 24 hours. **          Signed by: Virl Cagey MD    [facility]

## 2013-10-17 LAB — CBC AND DIFFERENTIAL
Basophils Absolute Automated: 0.03 (ref 0.00–0.20)
Basophils Automated: 0 %
Eosinophils Absolute Automated: 0.18 (ref 0.00–0.70)
Eosinophils Automated: 2 %
Hematocrit: 39.4 % — ABNORMAL LOW (ref 42.0–52.0)
Hgb: 13.3 g/dL (ref 13.0–17.0)
Immature Granulocytes Absolute: 0.01
Immature Granulocytes: 0 %
Lymphocytes Absolute Automated: 2.49 (ref 0.50–4.40)
Lymphocytes Automated: 35 %
MCH: 30 pg (ref 28.0–32.0)
MCHC: 33.8 g/dL (ref 32.0–36.0)
MCV: 88.7 fL (ref 80.0–100.0)
MPV: 10 fL (ref 9.4–12.3)
Monocytes Absolute Automated: 0.55 (ref 0.00–1.20)
Monocytes: 8 %
Neutrophils Absolute: 3.86 (ref 1.80–8.10)
Neutrophils: 54 %
Nucleated RBC: 0 (ref 0–1)
Platelets: 213 (ref 140–400)
RBC: 4.44 — ABNORMAL LOW (ref 4.70–6.00)
RDW: 14 % (ref 12–15)
WBC: 7.11 (ref 3.50–10.80)

## 2013-10-17 LAB — COMPREHENSIVE METABOLIC PANEL
ALT: 52 U/L (ref 0–55)
AST (SGOT): 39 U/L — ABNORMAL HIGH (ref 5–34)
Albumin/Globulin Ratio: 1.3 (ref 0.9–2.2)
Albumin: 3.5 g/dL (ref 3.5–5.0)
Alkaline Phosphatase: 74 U/L (ref 40–150)
BUN: 15 mg/dL (ref 7–21)
Bilirubin, Total: 0.4 mg/dL (ref 0.2–1.2)
CO2: 26 mEq/L (ref 22–29)
Calcium: 9.3 mg/dL (ref 8.5–10.5)
Chloride: 103 mEq/L (ref 98–107)
Creatinine: 0.8 mg/dL (ref 0.7–1.3)
Globulin: 2.7 g/dL (ref 2.0–3.6)
Glucose: 110 mg/dL — ABNORMAL HIGH (ref 70–100)
Potassium: 4 mEq/L (ref 3.5–5.1)
Protein, Total: 6.2 g/dL (ref 6.0–8.3)
Sodium: 140 mEq/L (ref 136–145)

## 2013-10-17 LAB — GFR: EGFR: >60

## 2013-10-17 LAB — GLUCOSE WHOLE BLOOD - POCT
Whole Blood Glucose POCT: 99 mg/dL (ref 70–100)
Whole Blood Glucose POCT: 199 mg/dL — ABNORMAL HIGH (ref 70–100)
Whole Blood Glucose POCT: 149 mg/dL — ABNORMAL HIGH (ref 70–100)
Whole Blood Glucose POCT: 172 mg/dL — ABNORMAL HIGH (ref 70–100)

## 2013-10-17 LAB — SEDIMENTATION RATE: Sed Rate: 28 — ABNORMAL HIGH (ref 0–10)

## 2013-10-17 LAB — C-REACTIVE PROTEIN: C-Reactive Protein: 3.5 mg/dL — ABNORMAL HIGH (ref 0.0–0.5)

## 2013-10-17 MED ORDER — OXYCODONE HCL ER 10 MG PO T12A
10.0000 mg | EXTENDED_RELEASE_TABLET | Freq: Three times a day (TID) | ORAL | Status: DC
Start: 2013-10-17 — End: 2014-07-08

## 2013-10-17 MED ORDER — INSULIN GLARGINE 100 UNIT/ML SC SOLN
38.0000 [IU] | Freq: Every morning | SUBCUTANEOUS | Status: AC
Start: 2013-10-17 — End: ?

## 2013-10-17 MED ORDER — GABAPENTIN 300 MG PO CAPS
300.0000 mg | ORAL_CAPSULE | Freq: Three times a day (TID) | ORAL | Status: DC
Start: 2013-10-17 — End: 2013-10-18
  Administered 2013-10-17 – 2013-10-18 (×3): 300 mg via ORAL
  Filled 2013-10-17 (×3): qty 1

## 2013-10-17 MED ORDER — GABAPENTIN 300 MG PO CAPS
300.0000 mg | ORAL_CAPSULE | Freq: Three times a day (TID) | ORAL | Status: DC
Start: 2013-10-17 — End: 2018-09-10

## 2013-10-17 MED ORDER — METFORMIN HCL 1000 MG PO TABS
1000.0000 mg | ORAL_TABLET | Freq: Two times a day (BID) | ORAL | Status: AC
Start: 2013-10-17 — End: ?

## 2013-10-17 MED ORDER — SIMVASTATIN 40 MG PO TABS
40.0000 mg | ORAL_TABLET | Freq: Every day | ORAL | Status: AC
Start: 2013-10-17 — End: ?

## 2013-10-17 MED ORDER — OXYCODONE HCL 15 MG PO TABS
15.0000 mg | ORAL_TABLET | ORAL | Status: DC | PRN
Start: 2013-10-17 — End: 2014-07-08

## 2013-10-17 NOTE — Rehab Progress Note ([facility]) (Signed)
[name] Brown  MRN: [id number]  Account: [id number]  Session Start: 10/17/2013 12:00:00 AM  Session Stop: 10/17/2013 12:00:00 AM    Rehabilitation Nursing  Inpatient Rehabilitation Shift Assessment    Rehab Diagnosis: Acquired brain injury; S/P craniotomy for evacuation of  brain  abscess  Demographics:            Age: 47Y            Gender: Male  Primary Language: English    Date of Onset:  09/30/13  Date of Admission: 10/07/2013 8:46:00 PM    Rehabilitation Precautions Restrictions:   Risk for falls, Aspiration/swallowing, severe processing delays    Patient Report: " I am fine."  Pain: Patient currently without complaints of pain.  Pain Reassessment: Pain was not reassessed as no pain was reported.  Patient/Caregiver Goals:  "to be able to care for self"    NEURO  Orientation/Awareness: Alert and Oriented x3.  Speech: No deficits noted at this time.  Behavior: Cooperative.    MEDICATIONS  IV Access: Patient has IV access.     Type: Double Lumen PICC  Location: right upper arm Length of line:  Care Provided:   Flushed with 3ml heparin (100u heparin /ml). Date Inserted:  Dialysis Access: Patient does not have dialysis access.      Elopement Risk Level Assessment Tool  Patient Criteria: Patient is not capable of leaving the unit.  Assessment is not  applicable.      RISK ASSESSMENT FOR FALLS/INJURY    MENTAL STATUS CRITERIA:   0 - None identified.  MENTAL STATUS TOTAL: 0    AGE CRITERIA:   48 - < 84 years old  AGE TOTAL: 0    ELIMINATION CRITERIA:   3 - Toileting with Assistance.   ELIMINATION TOTAL: 3    HISTORY OF FALLS CRITERIA:   2 - Unknown History.  HISTORY OF FALLS TOTAL: 2    MEDICATIONS CRITERIA:   2 or more High Risk Medications (*see list below)   MEDICATIONS TOTAL: 2    PHYSICAL MOBILITY CRITERIA:   3 - Decreased balance reaction.   1 - Weakness/impaired physical mobility.   PHYSICAL MOBILITY TOTAL: 4    FALLS RISK ASSESSMENT TOTAL: 11    Patient's Fall Risk: TOTAL SCORE >10: High Risk    Falls  Interventions: Clutter removed and clear path to BR.  Call bell, phone, glasses, etc within reach.  Hourly toileting/safety checks between 6am and 10pm, then every 2 hours.  Initiate Fall care plan and outcome.  Yellow "high risk" patient identification in place: wrist band, socks, chart  sticker, door sign.  Assistive devices at [facility].  Family at bedside.  Low bed with mat  Bed alarm    NUTRITION  Diet:  Type: Consistent carbohydrate.  Food Consistency: Regular.  Liquid Consistency: Thin.      CARDIOVASCULAR     Bilateral lower extremities  Nail Bed Color: Pink.   No edema or redness present.  Pulses:   Apical Pulse: Regular. Strong. Rate is 79 .   Patient does not have a pacemaker.   Patient does not have a defibrillator.    CARDIOPULMONARY  Lung Sounds:   Upper lobes. Clear.   Lower lobes. Clear.  Type of Respirations: Regular.  Cough: No cough noted.  Respiratory Support: The patient does not require any respiratory support.  Respiratory Equipment: CPAP machine. CPAP machine. Patient uses CPAPat night    INTEGUMENTARY  Skin:  Temperature: Cool  Turgor: Normal for age  Moisture: Dry  Color of skin: Normal for Race/Ethnicity  Capillary Refill: Less than 3 seconds  Wounds/Incisions:     Surgical Incision: Cranial incision. Length: 32 centimeter(s) healed. No signs  of infection.  Drainage: Incision without drainage.  Odor:  No  Incision Care: Per protocol.  Braden Scale for Predicting Pressure Sore Risk: Sensory Perception: No  impairment  Moisture: Occasionally moist  Activity: Walks occasionally  Mobility: Slightly limited  Nutrition: Adequate  Friction and Shear: Potential problem  Braden Score: 18  Level of Risk: At risk (15-18)   Pressure Relief every 30 minutes while in wheelchair    GASTROINTESTINAL  Abdomen: Soft.  Bowel Sounds:  Active bowel sounds audible in all four quadrants.  Date of Last Bowel Movement:  10/16/13   No Problems/Complaints with Bowel Elimination Assessed.    GENITOURINARY  Current Bladder  Pattern: Continent  Color:  Yellow   Patient denies problems with urination and/or catheter.    MUSCULOSKELETAL  Upper Body: Generalized weakness  Lower Body: Gait abnromality and Generalized weakness    Functional Measures    EATING: Eating Score = 5. Patient is supervision/set-up for eating, requiring:  Opening containers.  Cutting meat.  Pouring liquids.  Patient requires the following assistive device(s) No assistive devices were  required.    BLADDER MANAGEMENT - LEVEL OF ASSIST: Bladder Score = 5.  Patient is  supervision/set-up for bladder management, requiring: Setting out equipment.  Emptying equipment.  Patient requires the following assistive device(s): Urinal.    BLADDER ACCIDENTS THIS SHIFT:  0 . Patient has not had an accident this  assessment and did not require medications or devices.    BOWEL MANAGEMENT - LEVEL OF ASSIST: Bowel Score = 6.  Patient is modified  independent for bowel management.  Patient did not have bowel movement.  Medication/intervention was provided.    BOWEL ACCIDENTS THIS SHIFT: 0 . Patient has not had an accident, but used a  stool softener.    Education Provided:    Education Provided: Pain management. Side effects. Precautions. Safety issues  and interventions. Fall protocol.       Audience: Patient.       Mode: Explanation.  Demonstration.       Response: Applied knowledge.  Needs practice.  Needs reinforcement.    Discharge: Patient is not being discharged at this time.    Long Term Goals: 3 weeks  Short Term Goals: Pt will consistently use call light for assistance prior to  getting OOB/toilet to prevent fall. - Goal Not Met  Pt will report pain control at/or<3/10 prior to  and during therapy session with  medications and alternative. - Goal Met  Pt will report any s/s of incision/wound infection in order to receive timely  medical intervention.  7 to 10 days from 10/07/13    PROGRESS TOWARD GOALS: SHORT TERM GOAL REVIEW:       1. Pt will consistently use call light for  assistance prior to getting  OOB/toilet to prevent fall. - Met       2. Pt will report pain control at/or<3/10 prior to  and during therapy  session with medications and alternative. - Met  Time frame to achieve short term goal(s):  7 to 10 days from 10/07/13   LONG TERM GOAL REVIEW:  Timeframe to achieve long term goal(s):  3 weeks    PLAN: Nursing Specific Interventions  Diabetic Management. Medical Condition Management. Medication Management. Pain  Management. Skin Management. Wound Management.  Continue with  the current Nursing Plan of Care.    TEAM CARE PLAN  Identified problems from team documentation:  Problem: Impaired Cognition  Cognition: Primary Team Goal: Pt will utilize external memory aids to follow  daily schedule, recall recent/ongoing events, precautions, medications  and  learn compensatory strategies with occasional cues/support./Active    Problem: Impaired Mobility  Mobility: Primary Team Goal: Pt. will be able to transfer and ambulate >150 feet  with supervision to return to current living situation in hotel setting. [name]    Problem: Impaired Psychosocial Skills/Behavior  PsychoSocial: Primary Team Goal: Pt will demonstrate coping skills for adjusting  to medical situation and rehab hospitalization as reflected in a positive  attitude in his therapy participation/Active    Problem: Impaired Self-care Mgmt/ADL/IADL  Self Care: Primary Team Goal: pt to complete his UB/LB dressing with mod  I/Active    Add/Update Problems from this assessment:  No updates at this time.    Please review Integrated Patient View Care Plan Flowsheet for Team identified  Problems, Interventions, and Goals.    Signed by: Margrett Rud, RN 10/17/2013 1:33:00 PM

## 2013-10-17 NOTE — Rehab Discharge Summary ([facility]) (Addendum)
Corrected 10/17/2013 4:13:15 PM    NAME: [name] Brown  MRN: [id number]  Account: [id number]  Session Start: 10/17/2013 9:00:00 AM  Session Stop: 10/17/2013 10:00:00 AM    Occupational Therapy  Inpatient Rehabilitation Discharge Summary and Note    Rehab Diagnosis: Acquired brain injury; S/P craniotomy for evacuation of  brain  abscess  Demographics:            Age: 47Y            Gender: Male    Past Medical History: Diabetes, Multiple episodes of sinus infection and sinus  surgery, Hyperlipidemia, OSA    PAST SURGICAL HISTORY:  As above, plus multiple sinus surgeries and question of resection of  frontal lobe during one of those surgeries.  History of Present Illness: [name] Brown is a 47 y.o.male with extensive P MH of  sinus problems and surgeries admitted [name] 09/29/13 with AMS and headache after  a recent sinus infection. 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 Transfered to [facility] where he underwent urgent craniotomy for evacuation of  intracerebral MSSA abscess. Other medical issues include uncontrolled DM;  was  on 2 po meds at home,   Date of Onset: 09/30/13   Date of Admission: 10/07/2013 8:46:00 PM  Rehabilitation Precautions/Restrictions:   Risk for falls, Aspiration/swallowing, severe processing delays    SUBJECTIVE  Patient Report: "It's just hard for me to get going.  I just never feel like  starting."  Pain: Patient currently has pain. 4/10 headache with medication prior to start  of session.    OBJECTIVE  Cognition: Pt with significant executive dysfunction as well as impaired memory.   These deficits, particularly in regards to initiation, are the major barrier to  [name] achieving an independent level during ADLs.  Functional Measures    EATING: Eating Score = 5. Patient is supervision/set-up for eating, requiring:  Verbal cuing, prompting, or instructing.  Patient requires the following assistive  device(s) No assistive devices were  required. At times pt requires cues to initiate feeding.  This is especially  true with foods that require effort prior to eating (ie cutting meat).    GROOMING: Grooming Score = 5. Patient is supervision/set-up for grooming,  requiring: Verbal cuing, prompting, or instructing.  Initial preparation.  Applying toothpaste to toothbrush.  Patient requires the following assistive device(s) No assistive devices were  required. Pt requires assist to link activities together to complete grooming in  a timely manner.    BATHING: Patient bathed in shower. Bathing Score = 5.  Patient is  supervision/set-up for bathing, requiring: Standing by.  Verbal cuing, prompting, or instructing.  Patient requires the following assistive device(s): Grab bar/arm rest to  maintain balance.  Hand held shower. cues to avoid perseveration    UPPER BODY DRESSING: Upper Body Dressing Score = 6 Patient is modified  independent for upper body dressing, requiring: More than reasonable time.    LOWER BODY DRESSING: Lower Body Dressing  Score = 6. Patient is modified  independent for lower body dressing, requiring: More than reasonable time.    TOILETING: Toileting Score = 6.  Patient is modified independent for toileting,  requiring: More than reasonable time.    TRANSFER TOILET: Toilet Transfer Score = 5.  Patient is supervision/set-up for  transferring to and from the toilet/commode, requiring: Stand by assistance.  Verbal cuing, prompting, or instructing.  Patient requires the  following assistive device(s):  Walker.    TRANSFER SHOWER: Shower Transfer Score = 5.  Patient is supervision/set-up for  transferring to and from the shower, requiring: Stand by assistance.  Verbal cuing, prompting, or instructing. Walker.  Grab bars.  Shower chair. ambulatory    Today's Treatment Interventions:       Self Care/Home Management:  Pt completed AM ADL routine as described in  accompanying FIMs.  Pt was unable to shower  this date due to IV antibiotics in  place, and FIM scores for bathing and shower transfer are from RN report and  pt's prior performance.  Equipment Provided/Ordered: Pt would benefit from use of grab bar and/or shower  chair during bathing to ensure safety.    Education Provided:    Education Provided: Activities of daily living. Equipment. Safety.       Audience: Patient and Caregiver       Mode: Explanation.       Response: Verbalized understanding.    ASSESSMENT  Summary of Progress and Current Status: [name] is physically able to complete  all aspects of ADLs, but often requires cues (verbal and physical) to initiate  tasks.  Linking activities together often facilitates ADLs being completed in a  timely manner. He would benefit from 24 hour supervision as it is unlikely that  he would be able to safely problem-solve in the event of an emergency.  Further  skilled therapy services are recommended to maximize cognitive skill development  and return to prior level of function.    Progress Towards Goals (final status): SHORT TERM GOAL REVIEW:       1. Pt to complete LB dressing with set up - Met        2. Pt to complete UB/LB bathing with min A - Met       3. Pt to complete toileting task with supervision - Met       4. pt to complete bed mobility with supervision - Met   LONG TERM GOAL REVIEW:       1. pt to complete full am ADL routine( dressing/bathing), including  gathering ADL items with mod I - Not Met: supervision level due to need for  cueing.       2. pt to complete toileting and toilet transfer with mod I - Not Met  toileting=mod I, toilet transfer= supervision        3. pt to complete all functional transfers with mod I - Not Met  supervision is recommended.    PLAN  Occupational Therapy Plan: Pt would benefit from either home or outpatient  therapies.    Team Care Plan  Please review Integrated Patient View Care Plan Flowsheet for Team identified  Problems, Interventions, and Goals.    Identified  problems from team documentation:  Problem: Impaired Cognition  Cognition: Primary Team Goal: Pt will utilize external memory aids to follow  daily schedule, recall recent/ongoing events, precautions, medications  and  learn compensatory strategies with occasional cues/support./Active    Problem: Impaired Mobility  Mobility: Primary Team Goal: Pt. will be able to transfer and ambulate >150 feet  with supervision to return to current living situation in hotel setting. [name]    Problem: Impaired Psychosocial Skills/Behavior  PsychoSocial: Primary Team Goal: Pt will demonstrate coping skills for adjusting  to medical situation and rehab hospitalization as reflected in a positive  attitude in his therapy participation/Active    Problem: Impaired Self-care Mgmt/ADL/IADL  Self Care: Primary Team Goal:  pt to complete his UB/LB dressing with mod  I/Active    Status update for discharge:     Self Care Management:   Primary Goal: Not Met    3 Hour Rule Minutes: 60 minutes of OT treatment this session count towards  intensity and duration of therapy requirement. Patient was seen for the full  scheduled time of OT treatment this session.    Signed by: Arvella Nigh, OTR/L 10/17/2013 10:00:00 AM

## 2013-10-17 NOTE — Rehab Discharge Summary ([facility]) (Addendum)
Corrected 10/17/2013 4:34:19 PM    NAME: [name] Brown  MRN: [id number]  Account: [id number]  Session Start: 10/17/2013 1:00:00 PM  Session Stop: 10/17/2013 2:00:00 PM    Physical Therapy  Inpatient Rehabilitation Discharge Summary and Note    Rehab Diagnosis: Acquired brain injury; S/P craniotomy for evacuation of  brain  abscess  Demographics:            Age: 47Y            Gender: Male  Primary Language: [place]  Past Medical History: Diabetes, Multiple episodes of sinus infection and sinus  surgery, Hyperlipidemia, OSA    PAST SURGICAL HISTORY:  As above, plus multiple sinus surgeries and question of resection of  frontal lobe during one of those surgeries.  History of Present Illness: [name] Brown is a 47 y.o.male with extensive P MH of  sinus problems and surgeries admitted [facility] 09/29/13 with AMS and headache after  a recent sinus infection. 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 Transfered to [facility] where he underwent urgent craniotomy for evacuation of  intracerebral MSSA abscess. Other medical issues include uncontrolled DM;  was  on 2 po meds at home,   Date of Onset: 09/30/13   Date of Admission: 10/07/2013 8:46:00 PM  Rehabilitation Precautions/Restrictions:   Risk for falls, Aspiration/swallowing, severe processing delays    SUBJECTIVE  Patient Report: "My headache gets worse when I stand up."  Pain: Patient currently has pain. Report of HA at level 4/10. Pt. received pain  medication prior to therapy session.    OBJECTIVE  Bed Mobility: Pt. is independent with bed mobility with increased time.    Refer to Functional Measures for remainder of functional mobility status.    Functional Measures      TRANSFERS BED/CHAIR/WHEELCHAIR: Bed/chair/wheelchair Transfer Score = 5.  Patient is supervision/set-up for transferring to and from the  bed/chair/wheelchair, requiring: Verbal cuing, prompting, or  instructing.  Set up (positioning equipment, lock brakes and/or adjust foot rest).  Patient requires the following assistive device(s): No assistive devices were  required.    LOCOMOTION WHEELCHAIR:   Wheelchair did not occur. Patient refused w/c mobility. Pt. is ambulatory.    LOCOMOTION WALK:   Walk Distance Scale = 2.  Distance walked is 50 -149 feet. Walk Score = 2.  Patient is able to walk at least 50 feet (household distance) but requires  assistance. Patient walked a distance of 50 feet.  Pt. initially ambulating  without assistive device, but felt dizzy requesting use of LBQC. Pt. is safer  when ambulating with RW, but is able to ambulate short distances without device.   Decreased gait speed, increased base of support, short stride.    LOCOMOTION STAIRS: Stairs did not occur because patient refused.    Today's Treatment Interventions:       Therapeutic Activities:  Pt's family present for PT session and actively  involved in providing cues and assist with mobility. Pt. initially tolerating  <20 feet of ambulation without assistive device before having to sit due to HA.  Pt. with c/o dizziness and HA, requesting use of quad cane. Pt. not as steady on  quad cane than with RW and would benefit from RW upon d/c for stability. Pt.  practiced car transfer with supervision and cues. Pt. continues to require  excessive amount of time to complete mobility tasks.    Education Provided:  Education Provided: Car transfers.       Audience: Patient.       Mode: Explanation.       Response: Demonstrated skill.    Equipment Provided/Ordered: The patient has been provided with information and  care of Durable Medical Equipment as needed. RW ordered from [employer] for ambulation upon d/c.    ASSESSMENT  Summary of Progress and Current Status: Pt. presents with head ache and fatigue  limiting endurance and activity tolerance at this time. Pt. has mild balance  limitations, which is improved with use of  assistive device. The greatest  barrier at this time is the decreased initiation and motivation for completing  mobility tasks. Pt. requires increased time to complete tasks and cues for  initiation of tasks. Pt. would benefit from ongoing therapy to address these  issues in a home health setting.    Previously Documented Mode of Locomotion at Discharge: Walk  PAI Expected Mode of Locomotion at Discharge: No change to the current  documented expected, most frequently used mode of locomotion at discharge    Progress Toward Goals (final status):   LONG TERM GOAL REVIEW:       1. Pt. will perform functional transfers Mod I for safety in home  environment. - Met       2. Pt. will ambulate >150 feet with supervision and LRAD in order to access  room in hotel that he is living in. - Not Met Pt. ambulating household  distances, but unable to tolerate further distances at this time. Pt. only  requires about 50-60 feet ambulating to enter room at hotel.       3. Pt. will negotiate flight of stairs for increased accessibility to  community with supervision. - Not Met Pt. refusing stair negotiation at this  time. Stairs not required for d/c home.    PLAN  Physical Therapy Plan: Patient is recommended for home health therapy services.    Team Care Plan  Please review Integrated Patient View Care Plan Flowsheet for Team identified  Problems, Interventions, and Goals.    Identified problems from team documentation:  Problem: Impaired Cognition  Cognition: Primary Team Goal: Pt will utilize external memory aids to follow  daily schedule, recall recent/ongoing events, precautions, medications  and  learn compensatory strategies with occasional cues/support./Active    Problem: Impaired Mobility  Mobility: Primary Team Goal: Pt. will be able to transfer and ambulate >150 feet  with supervision to return to current living situation in hotel setting. [name]    Problem: Impaired Psychosocial Skills/Behavior  PsychoSocial: Primary Team  Goal: Pt will demonstrate coping skills for adjusting  to medical situation and rehab hospitalization as reflected in a positive  attitude in his therapy participation/Active    Problem: Impaired Self-care Mgmt/ADL/IADL  Self Care: Primary Team Goal: pt to complete his UB/LB dressing with mod I/Not  Met    Status update for discharge:     Mobility:   Primary Goal: Not Met   Discharge Status Comment:  Pt. is able to ambulate 50 feet and transfer with  supervision for access to hotel setting with cues and increased time.    3 Hour Rule Minutes: 60 minutes of PT treatment this session count towards  intensity and duration of therapy requirement. Patient was seen for the full  scheduled time of PT treatment this session.    Signed by: Chauncey Cruel, PT 10/17/2013 2:00:00 PM

## 2013-10-17 NOTE — Rehab Progress Note ([facility]) (Signed)
[name]  MRN: [id number]  Account: [id number]  Session Start: 10/17/2013 12:00:00 AM  Session Stop: 10/17/2013 12:00:00 AM    SEVERE SEPSIS SCREEN  INFECTION:  Patient has documented infection. Is on antibiotic therapy (not  prophylaxis).    SYSTEMATIC IMFLAMMATORY RESPONSE SYNDROME (SIRS):  Patient  has no indication of  systematic inflammatory response syndrome (SIRS). Negative Sepsis Screen.  If you are unable to assess a system's dysfunction because you do not have labs,  or the labs you have are not current (within 24 hours), call physician and  request and order for the lab tests needed.    Signed by: Marlou Sa, RN 10/17/2013 8:10:00 PM

## 2013-10-17 NOTE — Progress Notes (Signed)
AM BS=99, scheduled dos of Lantus given, snack provided.

## 2013-10-17 NOTE — Progress Notes (Addendum)
Infectious Diseases  Progress Note     Impression:   47 yom w/ MSSA brain abscess s/p debridement. See previous notes. Tolerating CTX. D/W wife. Abs look good.    Recommendations:   Continue CTX 2 grams BID as outpatient w/ f/u w/ FHx ID doctors to help determine duration of Tx.  D/W case Mgmt  Will sign off. D/C soon.    Subjective:   Better    Review of Systems  Patient without nausea, vomiting, diarrhea, rash, cough, shortness of breath, abdominal or chest pain.      Objective:   BP 102/67  Pulse 65  Temp 95.9 F (35.5 C) (Axillary)  Resp 20  Ht 1.829 m (6')  Wt 117.6 kg (259 lb 4.2 oz)  BMI 35.15 kg/m2  SpO2 96%    Temp (24hrs), Avg:96.9 F (36.1 C), Min:95.9 F (35.5 C), Max:97.9 F (36.6 C)      General: awake, alert,  no acute distress.  Cardiovascular: regular rate and rhythm,  Lungs: clear to auscultation bilaterally,  Abdomen: soft, non-tender  Extremities: no clubbing, cyanosis, or edema          Labs:    Lab 10/17/13 0649   WBC 7.11   HGB 13.3   HCT 39.4*   PLT 213   NEUTROPCT --   MONOPCT --       Lab 10/17/13 0649   NA 140   K 4.0   CL 103   CO2 26   BUN 15   CREAT 0.8   CA 9.3   ALB 3.5   PROT 6.2   BILITOTAL 0.4   ALKPHOS 74   ALT 52   AST 39*   GLU 110*

## 2013-10-17 NOTE — Rehab Discharge Summary ([facility]) (Signed)
[name] Brown  MRN: [id number]  Account: [id number]  Session Start: 10/17/2013 10:00:00 AM  Session Stop: 10/17/2013 11:00:00 AM    Speech Language Pathology  Inpatient Rehabilitation  Language Cognitive and Dysphagia Discharge Summary and Note    Rehab Diagnosis: Acquired brain injury; S/P craniotomy for evacuation of  brain  abscess  Demographics:            Age: 47Y            Gender: Male  Primary Language: [place]    Past Medical History: Diabetes, Multiple episodes of sinus infection and sinus  surgery, Hyperlipidemia, OSA    PAST SURGICAL HISTORY:  As above, plus multiple sinus surgeries and question of resection of  frontal lobe during one of those surgeries.  History of Present Illness: [name] Brown is a 47 y.o.male with extensive P MH of  sinus problems and surgeries admitted [facility] 09/29/13 with AMS and headache after  a recent sinus infection. 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 Transfered to [facility] where he underwent urgent craniotomy for evacuation of  intracerebral MSSA abscess. Other medical issues include uncontrolled DM;  was  on 2 po meds at home,   Date of Onset: 09/30/13   Date of Admission: 10/07/2013 8:46:00 PM  Premorbid Functional Level: Independent. 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 Family lives in hotel in [place]  with an offer on a 3 level townhouse pending.  Social/Educational History: Married; Several children living elsewhere, one in  college.  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 Just recently returned to [place] from  assignment in [place]. Wife is Tourist information centre manager.  Home Environment: Pt. is living in a hotel, as he just relocated here.    Rehabilitation Precautions/Restrictions:   Risk for falls, Aspiration/swallowing, severe processing delays    SUBJECTIVE  Patient Report: "I'll do whatever you want to do..."  Pain: Patient  currently without complaints of pain.      OBJECTIVE  Functional Measures      COMPREHENSION: Auditory comprehension is the usual mode. Comprehension Score =  6, Modified Independence.  Patient comprehends complex/abstract information in  their primary language with only mild difficulty.    EXPRESSION: Vocal expression is the usual mode. Patient does not express  complex/abstract information in their primary language without a helper.  Expression Score = 5, Stand By Prompting. Patient expresses basic daily needs or  ideas without prompting.  Patient has the following assistive device(s) or limitations:    SOCIAL INTERACTION: Social Interaction Score = 3, Moderate Direction. Patient  interacts appropriately 50-74% of the time.  Patient requires moderate/some  direction for the following behavior(s): Initiation    PROBLEM SOLVING: Patient does not make appropriate decisions in order to solve  complex problems without assistance from a helper. Problem Solving Score = 5,  Supervision.  Patient makes appropriate decisions in order to solve routine  problems with directing only under stressful or unfamiliar conditions, but no  more than 10% of the time, for the following behavior(s):    MEMORY: Memory Score = 4, Minimal Prompting. Patient recognizes and remembers  75-90% of the time. Patient requires minimal/occasional prompting for memory for  the following:    Current Diet: Reg/Thin    Today's Treatment Interventions:       Speech Treatment: Speech Treatment: Family training -  review of cues /  structure required for facilitation of basic ADL activities / routines.  Suggestions and demonstrations of therapauetic stimulation activities outside of  therapies post d/c (eg lumosity). Pt's initiation and processing speed continues  to improve session to session for lumosity drills.    Education Provided:    Education Provided: Cognitive functioning.       Audience: Patient and significant other.       Mode:  Explanation.  Demonstration.       Response: Applied knowledge.  Verbalized understanding.    ASSESSMENT  Summary of Progress and Current Status: At D/C, pt presents with mod-severe  cognitive deficits c/b markedly depressed initiation and slow processing /  attention deficits, although initiation does appear to be improving. A very flat  affect continues. Insight into range of deficits is emerging, but remains  somewhat limited which remains a safety conern (ie driving). He requires  supervision for basic ADL routine, but requires some extra time due to the  nature of his deficits. Pt will d/c 'home' to his hotel residence with assist  from son and wife, who have been educated on appropriate cues/structure for home  managment.    Progress Toward Goals (final status):   SHORT TERM GOAL REVIEW:       1. Pt will utilize memory log to recall daily schedule, at elast 1 safety  precaution, and recall 1 tx activity per session with min cues/support. - Met       2. Pt will improve attention, processing,  working memory in order to  complete simple functional problem solving tasks (simple self-cares, functional  reading, simple money management) with min-mod cues. - Met       3. Pt will initiate task requested/presented with no more than 1 repetition  needed. - Met   LONG TERM GOAL REVIEW:       1. Pt will utilize external memory aids to recall  daily schedule, tx  activities completed, precautions, medications including schedule, and goals  with supervision/occasional support. - Met       2. Pt will use cognitive compensatory strategies to support attention,  memory and executive function in order to complete/direct routine self-care,  familiar home management and hierarchical cognitive tasks with  supervision/occasional assist. - Met    PLAN  Speech Pathology Plan: Patient is recommended for home health therapy services.    Team Care Plan  Please review Integrated Patient View Care Plan Flowsheet for Team  identified  Problems, Interventions, and Goals.    Identified problems from team documentation:  Problem: Impaired Cognition  Cognition: Primary Team Goal: Pt will utilize external memory aids to follow  daily schedule, recall recent/ongoing events, precautions, medications  and  learn compensatory strategies with occasional cues/support./Active    Problem: Impaired Mobility  Mobility: Primary Team Goal: Pt. will be able to transfer and ambulate >150 feet  with supervision to return to current living situation in hotel setting. [name]    Problem: Impaired Psychosocial Skills/Behavior  PsychoSocial: Primary Team Goal: Pt will demonstrate coping skills for adjusting  to medical situation and rehab hospitalization as reflected in a positive  attitude in his therapy participation/Active    Problem: Impaired Self-care Mgmt/ADL/IADL  Self Care: Primary Team Goal: pt to complete his UB/LB dressing with mod  I/Active    Status update for discharge:     Cognition:   Primary Goal: Met    3 Hour Rule Minutes: 60 minutes of SLP treatment this session count towards  intensity and duration of therapy requirement. Patient was seen for the full  scheduled time of SLP treatment this session.    Signed by: Lawson Fiscal, M.S., CCC/SLP 10/17/2013 11:00:00 AM

## 2013-10-17 NOTE — Rehab Discharge Summary (Medilinks) (Signed)
[name] Brown  MRN: [id number]  Account: [id number]  Session Start: 10/17/2013 11:00:00 AM  Session Stop: 10/17/2013 12:00:00 PM    Therapeutic Recreation  Inpatient Rehabilitation Discharge Summary and Note    Rehab Diagnosis: Acquired brain injury; S/P craniotomy for evacuation of  brain  abscess  Demographics:            Age: 47Y            Gender: Male    Past Medical History: Diabetes, Multiple episodes of sinus infection and sinus  surgery, Hyperlipidemia, OSA    PAST SURGICAL HISTORY:  As above, plus multiple sinus surgeries and question of resection of  frontal lobe during one of those surgeries.  History of Present Illness: [name] Brown is a 47 y.o.male with extensive P MH of  sinus problems and surgeries admitted [facility] 09/29/13 with AMS and headache after  a recent sinus infection. 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 Transfered to [facility] where he underwent urgent craniotomy for evacuation of  intracerebral MSSA abscess. Other medical issues include uncontrolled DM;  was  on 2 po meds at home,            Date of Onset:  09/30/13            Date of Admission: 10/07/2013 8:46:00 PM    Rehabilitation Precautions/Restrictions:   Risk for falls, Aspiration/swallowing, severe processing delays    SUBJECTIVE  Patient Report: "[name], this is my wife..and my son [name]." "Let's play  Dominoes."  Pain: Patient currently without complaints of pain.    OBJECTIVE  Leisure Participation:  Supervision for participation in leisure activities.  Equipment Recommended: No leisure equipment was recommended at discharge.  Community Mobility: Patient Not assessed.    Functional Measures      SOCIAL INTERACTION: Social Interaction Score = 7, Independent. Patient is  completely independent for social interaction.  There are no activity  limitations.    Interventions: Therapeutic Activities. Leisure Counseling/Education.  Discharge  Planning. The following treatment was provided:   Pt participated in leisure activities of interest OOR, emphasizing initiation  and activity participation. Pts wife and son present for duration of session.  Pain Reassessment: Response to Pain Intervention: Pt reports headache  Post Intervention Pain Quality:  Aching.  Patient Reports Post Intervention Pain Level of: 6 out of 10  Pain Acceptable: No: Pt returned to room at end of session, provided ice water  per request. Nsg informed for administeration of pain meds.    Education Provided:    Education Provided: Benefit and value of leisure activity. Community leisure  resources.       Audience: Patient and significant other.       Mode: Explanation.  Demonstration.       Response: Verbalized understanding.  Demonstrated skill.    ASSESSMENT  Summary of Deficits and Recommended Follow-up: Mr Fennell presented to [facility] with  decreased activity participation, decreased endurance, and decreased initiation  secondary to cognitive deficits. Pt has made great gains with regard to  initiation of leisure tasks, requiring min VCs to maximize participation. Pt and  pts family with good understanding of needs upon d/c and resources for continued  participation in leisure tasks of interest including cards and Dominoes.  Progress Toward Goals:   LONG TERM GOAL REVIEW:       1. Pt will demonstrate increased initiation through participation in  leisure activities of interest with min VCs by discharge - Met       2. Pt will demonstrate increased activity tolerance through participation  in a variety of leisure activities off the unit - Met       3. Pt will participate in leisure activity of interest at mod I level in  order to return to premorbid leisure lifestyle - Met  Time frame to achieve above long term goal(s):  Discharge  Pt received ready in room in chair at bedside after nsg education with family  (wife and son) present. Pt with increased initiation, introducing  therapist to  family immediately upon entrance. Pt amb with supervision 4 feet to WC. Pt  brought to TR dayroom, chose cards from activity options. Pt learned rules to  new game, using BUE to maneuver cards at tabletop according to strategy. Pt  requiring some prompting but always playing his turn within 1 minute. Pt  requested to play Dominoes at end of round, participated in 2 rounds of Dominoes  at mod I level, requiring occassional cues for turn taking but maintaining  initiation of turn within 1 minute each turn. Pt reported a headache, rated at 6  pain level, during participation in dayroom setting. Pt returned to room at end  of session, remaining in [facility] with wife present, nsg informed of headache.    PLAN  Therapeutic Recreation services are not recommended at this time due to: No need  for skilled therapy intervention at this time. Pt returning to community with  wife and supportive family.    Care Plan  Identified problems from team documentation:  Problem: Impaired Cognition  Cognition: Primary Team Goal: Pt will utilize external memory aids to follow  daily schedule, recall recent/ongoing events, precautions, medications  and  learn compensatory strategies with occasional cues/support./Active    Problem: Impaired Mobility  Mobility: Primary Team Goal: Pt. will be able to transfer and ambulate >150 feet  with supervision to return to current living situation in hotel setting. [name]    Problem: Impaired Psychosocial Skills/Behavior  PsychoSocial: Primary Team Goal: Pt will demonstrate coping skills for adjusting  to medical situation and rehab hospitalization as reflected in a positive  attitude in his therapy participation/Active    Problem: Impaired Self-care Mgmt/ADL/IADL  Self Care: Primary Team Goal: pt to complete his UB/LB dressing with mod  I/Active    Status update for discharge:      Please review Integrated Patient View Care Plan Flowsheet for Team identified  Problems, Interventions, and  Goals.    Signed by: Conception Oms, CTRS 10/17/2013 12:00:00 PM

## 2013-10-17 NOTE — Progress Notes (Signed)
IRF Physiatry Attending Face to Face Progress Note   Functional Status/Update:   I reviewed patient's therapy notes to assess functional status and ongoing need for therapies. Of note, continues to demonstrate mod to severe cognitive impairments.    Subjective:   Complains of headache.  No constipation or other complaints.     Objective:   Filed Vitals:    10/15/13 2006 10/16/13 0610 10/16/13 2009 10/17/13 0601   BP: 113/71 105/54 115/67 102/67   Pulse: 74 61 79 65   Temp: 97.5 F (36.4 C) 96.4 F (35.8 C) 97.9 F (36.6 C) 95.9 F (35.5 C)   TempSrc:    Axillary   Resp: 16 20 16 20    Height:       Weight:       SpO2: 98% 97% 94% 96%       Physical Examination:   Appears well.  In no acute distress. Obese.   Anicteric sclerae. Conjunctivae non-injected.   Moist mucous membranes.   +S1S2 Heart rate and rhythm are regular. No significant lower limb edema.   Lungs are clear to auscultation bilaterally. No wheezes, rales, or rhonchi. Good respiratory effort.   Soft. Non-tender. Normoactive bowel sounds.   Alert Pleasant and cooperative; more animated today     New Labs:  Results     Procedure Component Value Units Date/Time    Comprehensive metabolic panel [[id number]]  (Abnormal) Collected:10/17/13 0649    Specimen Information:Blood Updated:10/17/13 0741     Glucose 110 (H) mg/dL      BUN 15 mg/dL      Creatinine 0.8 mg/dL      Sodium 409 mEq/L      Potassium 4.0 mEq/L      Chloride 103 mEq/L      CO2 26 mEq/L      CALCIUM 9.3 mg/dL      Protein, Total 6.2 g/dL      Albumin 3.5 g/dL      AST (SGOT) 39 (H) U/L      ALT 52 U/L      Alkaline Phosphatase 74 U/L      Bilirubin, Total 0.4 mg/dL      Globulin 2.7 g/dL      Albumin/Globulin Ratio 1.3     C Reactive Protein [[id number]]  (Abnormal) Collected:10/17/13 0649    Specimen Information:Blood Updated:10/17/13 0741     C-Reactive Protein 3.5 (H) mg/dL     GFR [[id number]] NFAOZHYQM:57/84/69 0649     EGFR >60.0 Updated:10/17/13 0741    Sedimentation rate (ESR)  [[id number]]  (Abnormal) Collected:10/17/13 0649    Specimen Information:Blood Updated:10/17/13 0738     Sed Rate 28 (H)     CBC and differential [[id number]]  (Abnormal) Collected:10/17/13 0649    Specimen Information:Blood / Blood Updated:10/17/13 0702     WBC 7.11      RBC 4.44 (L)      Hgb 13.3 g/dL      Hematocrit 53.6 (L) %      MCV 88.7 fL      MCH 30.0 pg      MCHC 33.8 g/dL      RDW 14 %      Platelets 213      MPV 10.0 fL      Neutrophils 54 %      Lymphocytes Automated 35 %      Monocytes 8 %      Eosinophils Automated 2 %      Basophils Automated 0 %  Immature Granulocyte 0 %      Nucleated RBC 0      Neutrophils Absolute 3.86      Abs Lymph Automated 2.49      Abs Mono Automated 0.55      Abs Eos Automated 0.18      Absolute Baso Automated 0.03      Absolute Immature Granulocyte 0.01     Glucose Whole Blood - POCT [[id number]] Collected:10/17/13 0623     POCT - Glucose Whole blood 99 mg/dL OZHYQMV:78/46/96 2952    Glucose Whole Blood - POCT [[id number]]  (Abnormal) Collected:10/16/13 2031     POCT - Glucose Whole blood 155 (H) mg/dL UUVOZDG:64/40/34 7425    Glucose Whole Blood - POCT [[id number]]  (Abnormal) Collected:10/16/13 1629     POCT - Glucose Whole blood 125 (H) mg/dL PPIRJJO:84/16/60 6301    Glucose Whole Blood - POCT [[id number]]  (Abnormal) Collected:10/16/13 1119     POCT - Glucose Whole blood 144 (H) mg/dL TDDUKGU:54/27/06 2376          Current medications:   Scheduled Meds:  Current Facility-Administered Medications   Medication Dose Route Frequency   . cefTRIAXone  2 g Intravenous Q12H SCH   . docusate sodium  100 mg Oral BID   . enoxaparin  40 mg Subcutaneous Daily   . famotidine  20 mg Oral Q12H SCH   . gabapentin  200 mg Oral Q8H SCH   . insulin glargine  38 Units Subcutaneous QAM   . metFORMIN  1,000 mg Oral BID Meals   . oxyCODONE  10 mg Oral Q8H SCH   . polyethylene glycol  17 g Oral Daily   . Senna  17.2 mg Oral QHS   . simvastatin  40 mg Oral Daily with dinner     PRN  Meds:.acetaminophen, alum & mag hydroxide-simethicone, bisacodyl, butalbital-acetaminophen-caffeine, chlorproMAZINE, dextrose, dextrose, glucagon (rDNA), insulin aspart, ondansetron, oxyCODONE, oxyCODONE, saline, zolpidem    Assessment: 47 y.o. male with Brain abscess    Plan:   REHAB: Continue comprehensive and intensive inpatient rehab program, including:   Physical therapy 60-120 min daily, 5-6 times per week, Occupational therapy  60-120 min daily, 5-6 times per week, Speech therapy  60-120 min daily, 5-6 times per week, Case management and Rehabilitation nursing    Will continue to address the following impairments and issues:  Mobility, ADLs, Cognitive impairments, Impaired strength, Impaired endurance, Caregiver training and Adjustment to disability    - Anticipate [place] home tomorrow with IV antibiotics and ID follow-up  - Increase gabapentin for headaches

## 2013-10-17 NOTE — Progress Notes (Addendum)
Pt. Is alert and oriented x3, VSS, on RA, no distress, c/o headache on anterior area, oxycodone 15 mg po given as ordered, pt. Can sometimes be impulsive and OOB without calling for assistance, bed alarm on and frequently visited and reviewed safety rules. Double lumen PICC to RUE, Rocephin IV given, continent of bowel and bladder. On 1500 Fluid restriction, voiding with no difficulty, had BM tonight, CPAP on, head incision site healing and OTA,  continue to monitor and treat prn.

## 2013-10-17 NOTE — Discharge Summary (Signed)
IRF DISCHARGE SUMMARY    Date of admission: 10/07/2013    Date of discharge: 10/18/2013    Attending physician: Dr. Leotis Shames T. Nile Riggs    Reason for admission: Acquired brain injury due to abscess     History of present illness: This 47 y.o. year old male with extensive history of sinus problems was admitted to [facility] on 12/4 with altered mental status and headache. After imaging demonstrated large brain abscess with extensive edema and shift, he was intubated, received mannitol, and transferred to [facility], where he underwent urgent craniotomy with drainage of abscess. Cultures + for MSSA and he will receive a long course of high dose Ceftriaxone. Hospital course notable for uncontrolled blood sugars, headaches, and mild dysphagia.   Refused to participate in PT this am, citing headaches.   Functional history:   Premorbidly, was independent with mobility and self-care. At time of admission, requires min assist with sit to stands, transfers, gait, and ADLs.    Hospital Course:  Has received comprehensive inpatient inpatient rehabilitation, including PT, OT, and SLP.  Has progressed to a supervision level with mobility and self-care.  Slow initiation and headaches are primary limiting factors.  Remains on Rocephin - CRP trending down.  Gabapentin started for headaches - patient reports he took this previously with good effect.    Discharge location:Home with assistance      Discharge instructions:  Refer to case management discharge summary for additional follow-up information.  Needs to follow-up with ID for home antibiotics management.      Rehabilitation services required:  Home physical therapy for impaired mobility.  Home occupational therapy for impaired self-care skills.  Home SLP for cog-comm impairments.  Home nursing for medication management/ IV antibiotics  I have had a face to face visit with patient on 12/22 and have determined there is need for home health services as the patient is unable to leave home  without assistance at this time.    Discharge medications:      Jaimon, Bugaj   Home Medication Instructions ZOX:[id number]    Printed on:10/17/13 1119   Medication Information                      saline (OCEAN NASAL SPRAY) 0.65 % Solution  2 sprays by Each Nare route as needed.             senna-docusate (PERICOLACE) 8.6-50 MG per tablet  Take 2 tablets by mouth nightly.             famotidine (PEPCID) 20 MG tablet  Take 1 tablet (20 mg total) by mouth every 12 (twelve) hours.             cefTRIAXone (ROCEPHIN) 2 G injection  Inject 2,000 mg (2 g total) into the vein 2 (two) times daily.             insulin aspart (NOVOLOG) 100 UNIT/ML injection  Inject 1-12 Units into the skin as needed for High Blood Sugar.             oxyCODONE (OXYCONTIN) 10 MG 12 hr tablet  Take 1 tablet (10 mg total) by mouth every 8 (eight) hours.             oxyCODONE (ROXICODONE) 15 MG immediate release tablet  Take 1 tablet (15 mg total) by mouth every 3 (three) hours as needed.             gabapentin (NEURONTIN) 300 MG capsule  Take  1 capsule (300 mg total) by mouth every 8 (eight) hours.             insulin glargine (LANTUS) 100 UNIT/ML injection  Inject 38 Units into the skin every morning.             metFORMIN (GLUCOPHAGE) 1000 MG tablet  Take 1 tablet (1,000 mg total) by mouth 2 (two) times daily with meals.             simvastatin (ZOCOR) 40 MG tablet  Take 1 tablet (40 mg total) by mouth daily with dinner.               Discharge diagnoses:   MSSA brain abscess  DM2  Obesity  Headaches    CC: No primary provider on file. (None)

## 2013-10-17 NOTE — Rehab Discharge Instruction (Medilinks) (Signed)
[name]  MRN: [id number]  Account: [id number]  Session Start: 10/17/2013 12:00:00 AM  Session Stop: 10/17/2013 12:00:00 AM    Case Management  Inpatient Rehabilitation Discharge Instructions    Discharge Plan: [name] to discharge home on Tuesday 10/18/2013 in care of  family via car with family assistance, home infusion services for IV antibiotics  provided by [facility] [phone], and home health care services as  coordinated by [facility] [phone]; start of care to be initiated by agency  24-48 hours after discharge.    Follow-up Appointment(s):    Primary Care MD ([facility]/[place] [phone])  in 1 week    Dr. Jaynie Collins, Neurosurgery, [phone] in 1-2 weeks    Dr. Thedore Mins, ENT/ Otorhinolaryngology, [phone] in 2 weeks    Dr. Georgiana Spinner, Infectious Disease, [phone] in 1 week    Dr. Nile Riggs, Rehabilitation, [phone] as needed    Additional Information: We wish you well with your continued recovery.    Signed by: Donnal Moat, LCSW 10/17/2013 1:01:00 PM

## 2013-10-17 NOTE — Rehab Progress Note (Medilinks) (Signed)
NAMEJEROMEY KRUER  MRN: [id number]  Account: [id number]  Session Start: 10/17/2013 12:00:00 AM  Session Stop: 10/17/2013 12:00:00 AM    SEVERE SEPSIS SCREEN  INFECTION:  Patient has documented infection. Is on antibiotic therapy (not  prophylaxis).    SYSTEMATIC IMFLAMMATORY RESPONSE SYNDROME (SIRS):  Patient  has no indication of  systematic inflammatory response syndrome (SIRS). Negative Sepsis Screen.  If you are unable to assess a system's dysfunction because you do not have labs,  or the labs you have are not current (within 24 hours), call physician and  request and order for the lab tests needed.    Signed by: Margrett Rud, RN 10/17/2013 10:09:00 AM

## 2013-10-18 LAB — GLUCOSE WHOLE BLOOD - POCT
Whole Blood Glucose POCT: 110 mg/dL — ABNORMAL HIGH (ref 70–100)
Whole Blood Glucose POCT: 165 mg/dL — ABNORMAL HIGH (ref 70–100)

## 2013-10-18 NOTE — Rehab Progress Note (Medilinks) (Signed)
[name] Brown  MRN: [id number]  Account: [id number]  Session Start: 10/17/2013 12:00:00 AM  Session Stop: 10/17/2013 12:00:00 AM    Rehabilitation Nursing  Inpatient Rehabilitation Shift Assessment    Rehab Diagnosis: Acquired brain injury; S/P craniotomy for evacuation of  brain  abscess  Demographics:            Age: 47Y            Gender: Male  Primary Language: English    Date of Onset:  09/30/13  Date of Admission: 10/07/2013 8:46:00 PM    Rehabilitation Precautions Restrictions:   Risk for falls, Aspiration/swallowing, severe processing delays    Patient Report: "I am ok"  Pain: Patient currently without complaints of pain.  Pain Reassessment: Pain was not reassessed as no pain was reported.  Patient/Caregiver Goals:  "to be able to care for self"    NEURO  Orientation/Awareness: Alert and Oriented x3.  Speech: No deficits noted at this time.  Behavior: Cooperative.    MEDICATIONS  IV Access: Patient has IV access.     Type: Double Lumen PICC  Location: RUE Length of line:  Care Provided:   Flushed after medication with 5ml normal saline followed by 2.16ml (100u heparin  /ml). Using SASH method. Date Inserted:  Dialysis Access: Patient does not have dialysis access.      Elopement Risk Level Assessment Tool  Patient Criteria: Patient is not capable of leaving the unit.  Assessment is not  applicable.      RISK ASSESSMENT FOR FALLS/INJURY    MENTAL STATUS CRITERIA:   0 - None identified.  MENTAL STATUS TOTAL: 0    AGE CRITERIA:   62 - < 27 years old  AGE TOTAL: 0    ELIMINATION CRITERIA:   3 - Toileting with Assistance.   ELIMINATION TOTAL: 3    HISTORY OF FALLS CRITERIA:   2 - Unknown History.  HISTORY OF FALLS TOTAL: 2    MEDICATIONS CRITERIA:   2 or more High Risk Medications (*see list below)   MEDICATIONS TOTAL: 2    PHYSICAL MOBILITY CRITERIA:   3 - Decreased balance reaction.   1 - Weakness/impaired physical mobility.   PHYSICAL MOBILITY TOTAL: 4    FALLS RISK ASSESSMENT TOTAL: 11    Patient's Fall Risk:  TOTAL SCORE >10: High Risk    Falls Interventions: Clutter removed and clear path to BR.  Call bell, phone, glasses, etc within reach.  Hourly toileting/safety checks between 6am and 10pm, then every 2 hours.  Yellow "high risk" patient identification in place: wrist band, socks, chart  sticker, door sign.  Pt and family education.  Assistive devices at [facility].  Bed alarm    NUTRITION  Diet:  Type: Consistent carbohydrate.  Food Consistency: Regular.  Liquid Consistency: Thin.      CARDIOVASCULAR     Bilateral lower extremities  Nail Bed Color: Pink.   No edema or redness present.  Pulses:   Apical Pulse: Regular. Strong. Rate is 99 .   Patient does not have a pacemaker.   Patient does not have a defibrillator.    CARDIOPULMONARY  Lung Sounds:   Upper lobes. Clear.   Lower lobes. Clear.  Type of Respirations: Regular.  Cough: No cough noted.  Respiratory Support: The patient does not require any respiratory support.  Respiratory Equipment: None.    INTEGUMENTARY  Skin:  Temperature: Warm  Turgor: Normal for age  Moisture: Dry  Color of skin: Normal for Race/Ethnicity  Capillary Refill: Less than 3 seconds  Wounds/Incisions:     Surgical Incision: Cranial incision. Length: 32 centimeter(s) healed. No signs  of infection.  Drainage: Incision without drainage.  Odor:  No  Incision Care: Incision healing  Braden Scale for Predicting Pressure Sore Risk: Sensory Perception: No  impairment  Moisture: Rarely moist  Activity: Walks occasionally  Mobility: Slightly limited  Nutrition: Adequate  Friction and Shear: No apparent problem  Braden Score: 20  Level of Risk: No risk (19-23). Will reassess every shift.    GASTROINTESTINAL  Abdomen: Soft. Nontender.  Bowel Sounds:  Active bowel sounds audible in all four quadrants.  Date of Last Bowel Movement:  10/17/13   No Problems/Complaints with Bowel Elimination Assessed.    GENITOURINARY  Current Bladder Pattern: Continent  Color:  Yellow   Patient denies problems with urination  and/or catheter.    MUSCULOSKELETAL  Upper Body: Generalized weakness  Lower Body: Gait abnromality and Generalized weakness    Functional Measures      BLADDER MANAGEMENT - LEVEL OF ASSIST: Bladder Score = 7. Patient is completely  independent for bladder management. There are no activity limitations.    BLADDER ACCIDENTS THIS SHIFT:  0 . Patient has not had an accident this  assessment and did not require medications or devices.    BOWEL MANAGEMENT - LEVEL OF ASSIST: Bowel Score = 6. Patient is modified  independent for bowel management requiring: Stool softeners    BOWEL ACCIDENTS THIS SHIFT: 0 . Patient has not had an accident, but used a  stool softener.    Education Provided:    Education Provided: Precautions. Safety issues and interventions. Fall protocol.         Audience: Patient.       Mode: Explanation.       Response: Verbalized understanding.    Discharge: Patient is not being discharged at this time.    Long Term Goals: 3 weeks  Short Term Goals: Pt will consistently use call light for assistance prior to  getting OOB/toilet to prevent fall. - Goal Met  Pt will report pain control at/or<3/10 prior to  and during therapy session with  medications and alternative. - Goal Met  Pt will report any s/s of incision/wound infection in order to receive timely  medical intervention.  7 to 10 days from 10/07/13    PROGRESS TOWARD GOALS:    PLAN: Nursing Specific Interventions  Diabetic Management. Medical Condition Management. Medication Management. Pain  Management. Skin Management. Wound Management.  Continue with the current Nursing Plan of Care.    TEAM CARE PLAN  Identified problems from team documentation:  Problem: Impaired Cognition  Cognition: Primary Team Goal: Pt will utilize external memory aids to follow  daily schedule, recall recent/ongoing events, precautions, medications  and  learn compensatory strategies with occasional cues/support./Met    Problem: Impaired Mobility  Mobility: Primary Team  Goal: Pt. will be able to transfer and ambulate >150 feet  with supervision to return to current living situation in hotel setting. /Not  Met    Problem: Impaired Psychosocial Skills/Behavior  PsychoSocial: Primary Team Goal: Pt will demonstrate coping skills for adjusting  to medical situation and rehab hospitalization as reflected in a positive  attitude in his therapy participation/Active    Problem: Impaired Self-care Mgmt/ADL/IADL  Self Care: Primary Team Goal: pt to complete his UB/LB dressing with mod I/Not  Met    Add/Update Problems from this assessment:  No updates at this time.  Please review Integrated Patient View Care Plan Flowsheet for Team identified  Problems, Interventions, and Goals.    Signed by: Marlou Sa, RN 10/17/2013 11:35:00 PM

## 2013-10-18 NOTE — Progress Notes (Signed)
[facility] Physiatry Attending Brief Note  [x] No new events    Subjective:  Complains of continued headache.    Objective:  Vitals: BP 125/75  Pulse 71  Temp 95.2 F (35.1 C) (Axillary)  Resp 18  Ht 1.829 m (6')  Wt 117.6 kg (259 lb 4.2 oz)  BMI 35.15 kg/m2  SpO2 96%    Appears well.  In no apparent distress.  Sclerae anicteric.   Moist mucous membranes.  No significant lower limb edema.  Speech is fluent and appropriate  Blunted affect    New labs   Results     Procedure Component Value Units Date/Time    Glucose Whole Blood - POCT [[id number]]  (Abnormal) Collected:10/18/13 0539     POCT - Glucose Whole blood 110 (H) mg/dL YNWGNFA:21/30/86 5784    Glucose Whole Blood - POCT [[id number]]  (Abnormal) Collected:10/17/13 2109     POCT - Glucose Whole blood 172 (H) mg/dL XLKGMWN:02/72/53 6644    Glucose Whole Blood - POCT [[id number]]  (Abnormal) Collected:10/17/13 1645     POCT - Glucose Whole blood 149 (H) mg/dL GLOVFIE:33/29/51 8841    Glucose Whole Blood - POCT [[id number]]  (Abnormal) Collected:10/17/13 1056     POCT - Glucose Whole blood 199 (H) mg/dL FUXNATF:57/32/20 2542          Assessment/Plan: 47 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

## 2013-10-18 NOTE — Rehab Progress Note ([facility]) (Signed)
[name] Brown  MRN: [id number]  Account: [id number]  Session Start: 10/18/2013 12:00:00 AM  Session Stop: 10/18/2013 12:00:00 AM    SEVERE SEPSIS SCREEN  INFECTION:  Patient has documented infection. Is on antibiotic therapy (not  prophylaxis).    SYSTEMATIC IMFLAMMATORY RESPONSE SYNDROME (SIRS):  Patient  has no indication of  systematic inflammatory response syndrome (SIRS). Negative Sepsis Screen.  If you are unable to assess a system's dysfunction because you do not have labs,  or the labs you have are not current (within 24 hours), call physician and  request and order for the lab tests needed.    Signed by: Eddie Dibbles, RN 10/18/2013 8:00:00 AM

## 2013-10-18 NOTE — Rehab Progress Note ([facility]) (Signed)
NAMEBERNADETTE ARMIJO  MRN: [id number]  Account: [id number]  Session Start: 10/18/2013 12:00:00 AM  Session Stop: 10/18/2013 12:00:00 AM    Rehabilitation Nursing  Inpatient Rehabilitation Shift Assessment    Rehab Diagnosis: Acquired brain injury; S/P craniotomy for evacuation of  brain  abscess  Demographics:            Age: 47Y            Gender: Male  Primary Language: English    Date of Onset:  09/30/13  Date of Admission: 10/07/2013 8:46:00 PM    Rehabilitation Precautions Restrictions:   Risk for falls, Aspiration/swallowing, severe processing delays    Patient Report: " I will be going home today"  Pain: Patient currently has pain.  Location: Head  Type: Acute  Quality: Aching.  Pain Scale: Numeric.  Patient reports a pain level of 6 out of 10.  Patient's acceptable level of pain 2 out of 10.   Pain does not interfere with any activity at this time.  Pain is alleviated by:  Pain is exacerbated by: Patient medicated.  Pain Reassessment: Pain was not reassessed as no pain was reported.  Patient/Caregiver Goals:  "to be able to care for self"    NEURO  Orientation/Awareness: Alert and Oriented x3.  Speech: Clear.  Behavior: Cooperative.    MEDICATIONS  IV Access: Patient has IV access.     Type: Double Lumen PICC  Location: RUE Length of line:  Care Provided:   Flushed with 3ml heparin (100u heparin /ml). Date Inserted:     Type: Saline Lock  IV Gauge:  Location:  Care Provided: Flushed with 10ml normal saline.   Date Inserted:  Dialysis Access: Patient does not have dialysis access.      Elopement Risk Level Assessment Tool  Patient Criteria: Patient is not capable of leaving the unit.  Assessment is not  applicable.      RISK ASSESSMENT FOR FALLS/INJURY    MENTAL STATUS CRITERIA:   0 - None identified.  MENTAL STATUS TOTAL: 0    AGE CRITERIA:   62 - < 48 years old old  AGE TOTAL: 0    ELIMINATION CRITERIA:   3 - Toileting with Assistance.   ELIMINATION TOTAL: 3    HISTORY OF FALLS CRITERIA:   2 - Unknown  History.  HISTORY OF FALLS TOTAL: 2    MEDICATIONS CRITERIA:   2 or more High Risk Medications (*see list below)   MEDICATIONS TOTAL: 2    PHYSICAL MOBILITY CRITERIA:   3 - Decreased balance reaction.   1 - Weakness/impaired physical mobility.   PHYSICAL MOBILITY TOTAL: 4    FALLS RISK ASSESSMENT TOTAL: 11    Patient's Fall Risk: TOTAL SCORE >10: High Risk    Falls Interventions: Clutter removed and clear path to BR.  Call bell, phone, glasses, etc within reach.  Hourly toileting/safety checks between 6am and 10pm, then every 2 hours.  Initiate Fall care plan and outcome.    NUTRITION  Diet:  Type: Consistent carbohydrate.  Food Consistency: Regular.  Liquid Consistency: Thin.      CARDIOVASCULAR     Bilateral lower extremities  Nail Bed Color: Pink.   No edema or redness present.  Pulses:   Apical Pulse: Regular. Strong. Rate is .   Patient does not have a pacemaker.   Patient does not have a defibrillator.    CARDIOPULMONARY  Lung Sounds:   Upper lobes. Clear.   Lower lobes.  Type of Respirations: Regular.  Regular.  Cough: No cough noted.  Respiratory Support: The patient does not require any respiratory support.  Respiratory Equipment: None.    INTEGUMENTARY  Skin:  Temperature: Warm  Turgor: Normal for age  Moisture: Dry  Color of skin: Normal for Race/Ethnicity  Capillary Refill: Less than 3 seconds  Wounds/Incisions:  Braden Scale for Predicting Pressure Sore Risk: Sensory Perception: Completely  limited  Moisture: Rarely moist  Activity: Walks occasionally  Mobility: Slightly limited  Nutrition: Excellent  Friction and Shear: No apparent problem  Braden Score: 18  Level of Risk: At risk (15-18)    GASTROINTESTINAL  Abdomen: Soft.  Bowel Sounds:  Active bowel sounds audible in all four quadrants.  Date of Last Bowel Movement:  12/22   No Problems/Complaints with Bowel Elimination Assessed.    GENITOURINARY  Current Bladder Pattern: Continent  Color:  Yellow   Patient denies problems with urination and/or  catheter.    MUSCULOSKELETAL  Upper Body: Generalized weakness  Lower Body: Gait abnromality and Generalized weakness    Functional Measures    EATING: Eating Score = 7. Patient is completely independent for eating.  There  are no activity limitations.    BLADDER MANAGEMENT - LEVEL OF ASSIST: Bladder Score = 7. Patient is completely  independent for bladder management. There are no activity limitations.    BLADDER ACCIDENTS THIS SHIFT:  0 . Patient has not had an accident this  assessment and did not require medications or devices. 0    BOWEL MANAGEMENT - LEVEL OF ASSIST: Bowel Score = 6. Patient is modified  independent for bowel management requiring: Stool softeners    BOWEL ACCIDENTS THIS SHIFT: 0 . Patient has not had an accident, but used a  stool softener.    Education Provided:    Education Provided: Precautions. Safety issues and interventions. Fall protocol.         Audience: Patient.       Mode: Explanation.       Response: Verbalized understanding.    Discharge: Patient is being discharged at this time.    Long Term Goals: 3 weeks  Short Term Goals: Pt will consistently use call light for assistance prior to  getting OOB/toilet to prevent fall. - Goal Met  Pt will report pain control at/or<3/10 prior to  and during therapy session with  medications and alternative. - Goal Met  Pt will report any s/s of incision/wound infection in order to receive timely  medical intervention.  7 to 10 days from 10/07/13    PROGRESS TOWARD GOALS: SHORT TERM GOAL REVIEW:       1. Pt will consistently use call light for assistance prior to getting  OOB/toilet to prevent fall. - Met       2. Pt will report pain control at/or<3/10 prior to  and during therapy  session with medications and alternative. - Not Met: Pain will be managed at  home       3. Pt will report any s/s of incision/wound infection in order to receive  timely medical intervention. - Met       3. Pt will report any s/s of incision/wound infection in order to  receive  timely medical intervention. - Met  Time frame to achieve short term goal(s):  7 to 10 days from 10/07/13    PLAN: Nursing Specific Interventions  Diabetic Management. Medical Condition Management. Medication Management. Pain  Management. Skin Management. Wound Management.  Continue with the current Nursing Plan of Care.  TEAM CARE PLAN  Identified problems from team documentation:  Problem: Impaired Cognition  Cognition: Primary Team Goal: Pt will utilize external memory aids to follow  daily schedule, recall recent/ongoing events, precautions, medications  and  learn compensatory strategies with occasional cues/support./Met    Problem: Impaired Mobility  Mobility: Primary Team Goal: Pt. will be able to transfer and ambulate >150 feet  with supervision to return to current living situation in hotel setting. /Not  Met    Problem: Impaired Psychosocial Skills/Behavior  PsychoSocial: Primary Team Goal: Pt will demonstrate coping skills for adjusting  to medical situation and rehab hospitalization as reflected in a positive  attitude in his therapy participation/Active    Problem: Impaired Self-care Mgmt/ADL/IADL  Self Care: Primary Team Goal: pt to complete his UB/LB dressing with mod I/Not  Met    Add/Update Problems from this assessment:  No updates at this time.    Please review Integrated Patient View Care Plan Flowsheet for Team identified  Problems, Interventions, and Goals.    Signed by: Eddie Dibbles, RN 10/18/2013 8:00:00 AM

## 2013-10-18 NOTE — Rehab Discharge Instruction ([facility]) (Signed)
[name] Brown  MRN: [id number]  Account: [id number]  Session Start: 10/18/2013 12:00:00 AM  Session Stop: 10/18/2013 12:00:00 AM    Rehabilitation Nursing  Inpatient Rehabilitation Discharge Instructions    Discharge Date: 10/18/2013    Transportation: Patient's Mode of Transportation: Wheelchair Private Car  Accompanied By:  Myrtie Hawk.    Recommendations for Follow-Up Care:   Yes, patient received valuables.  Bladder Program:  Bowel Program:  Last Bowel Movement- 10/17/13    Skin:  Current Diet: Diabetic  Pain Management: Medication. Medication.    VITAL SIGNS  Blood Pressure:   mmHg  Temperature:   degrees  Pulse: 71  beats per minute  Respirations: 18  breaths per minute  Pain: 3/10    [place] Medications: Refer to separate medications list provided.        Please follow the home exercise programs provided by your therapists in addition  to the following recommendations.  Stop activity if shortness of breath or chest  pain occurs.    Call your physician if you experience excessive pain, fever, or other concerns.    PRECAUTIONS    A.  Don't move patient using affected arm or leg.  B.  Keep emergency numbers by the phone.  [place] brakes if using a wheelchair.  Move wheelchair foot rest prior to  standing.  D.  Remove obstacles from the home (throw rugs).  E.  Follow prescribed instructions.  F.  If you fall, call 911 for assistance.  G.  If you are on home oxygen then ensure that smoke detectors are present and  properly functioning. No smoking when oxygen is in use. Wear only non-flammable  clothing when oxygen is in use.    When standing from a bed or chair, push off from bed/chair. Do not pull on  walker or helper. If using a wheelchair, always lock the brakes and remove leg  rests before getting in/out of wheelchair.    No driving until cleared by your primary care physician.      MEDICATION USE GUIDELINES    1. Become familiar with the names of your medication(s) and their appearance.  2. Know why you are taking  each medication, what undesirable effects you may  expect, and when to contact your physician.  3. Consult with a pharmacist and /or your physician before using any over the  counter (OTC) medications.  4. Be compliant with your medication schedule.  If you feel you cannot, then  discuss alternatives with your physician.  5. If you are taking Enteric coated or extended/delayed release product(s), then  never crush, chew, open, cut, break or destroy by any means because serious  effects can occur.  6. When storing your medications, avoid hot and humid places (i.e., kitchen,  bathrooms).  7. Select a safe place out of children's reach to store medications.  Follow any  other special instructions.  8. Inform your doctors of all the medications you are taking, including those  you buy without a prescription.  9. Use the same pharmacy to buy your prescriptions whenever possible.    Signed by: Eddie Dibbles, RN 10/18/2013 10:54:00 AM

## 2013-10-18 NOTE — Progress Notes (Addendum)
Home Health Referral          Referral from Donnal Moat, (Case Manager) for home health care upon discharge.    By [employer], the patient has the right to freely choose a home care provider.  Arrangements have been made with:     A company of the patients choosing. We have supplied the patient with a listing of providers in your area who asked to be included and participate in Medicare.   [facility], a home care agency that provides both adult home care services which is a wholly owned and operated by [employer] and participates in [employer]   The preferred provider of your insurance company. Choosing a home care provider other than your insurance company's preferred provider may affect your insurance coverage.    The Home Health Care Referral Form acknowledging the voluntary selection of the home care company has been completed, signed, and is on file.      Home Health Discharge Information     Your doctor has ordered Physical Therapy, Occupational Therapy and Speech and Language Therapy in-home service(s) for you while you recuperate at home, to assist you in the transition from hospital to home.      The agency that you or your representative chose to provide the service:  TBD    [facility]:  Name of Infusion Company : [facility] - [phone] - rocephin every 12 hours, begins 10/18/13 p.m. dose]     The above services were set up by:  Emiliano Dyer  [facility] Liaison)   Phone        [phone]         Signed by: Emiliano Dyer  Date Time: 10/18/2013 12:02 PM

## 2013-10-28 NOTE — Rehab PPS CMG ([facility]) (Signed)
[name] Brown  MRN: [id number]  Account: [id number]  Session Start: 10/18/2013 12:00:00 AM  Session Stop: 10/18/2013 12:00:00 AM    PPS CMG Coordinator  Inpatient Rehabilitation Discharge    Mode of Locomotion: Walking.    Discharge Against Medical Advice:  No.  Discharge Information: Patient Discharged Alive:  Yes  Discharge Destination/Living Setting: Home.  At discharge, the patient was discharged to live (with) (02)  Family / Relatives      Impairment Group: Brain Dysfunction: 02.1 Non-traumatic    Comorbidities:  Rank Code      Description    1    787.20    DYSPHAGIA NOS  2    348.30    ENCEPHALOPATHY NOS  3    250.82    DM2/NOS W MANIF NEC [facility]  4    041.11    MSSA  5    477.9     ALLERGIC RHINITIS NOS  6    327.23    OBSTRUCTIVE SLEEP APNEA  7    272.4     HYPERLIPIDEMIA NEC and NOS  8    784.0     HEADACHE  9    799.59    OTHER SIGNS AND SYMPTOMS INVOLVING COGNITION    ********************************  PAI Bladder Accidents: 0  - Accidents.  Patient used medications/device this  shift.  10/12/2013 11:55:00 PM  Bladder Score = 6. Patient has not had an accident, but uses a  device/medication.  PAI Bowel Accident: 0  - Accidents.  Patient used medications/device this shift.   10/18/2013 8:00:00 AM  Bowel Score = 6. Patient has no accidents, but uses a device/medications.    QUALITY INDICATORS  Pressure Ulcers - Discharge: Unhealed Pressure Ulcer(s) Present on Discharge:  No.  Number of Stage 1 pressure ulcers present on Admission and have completely  closed upon Discharge: 0  Number of Stage 2 pressure ulcers present on Admission and have completely  closed upon Discharge: 0  Number of Stage 3 pressure ulcers present on Admission and have completely  closed upon Discharge: 0  Number of Stage 4 pressure ulcers present on Admission and have completely  closed upon Discharge: 0    O0250.Influenza Vaccine - Discharge: Received in this facility for this year's  influenza vaccination season:  No.  Influenza  Vaccine Not Received Due To: Offered and declined.    Signed by: Roselyn Bering, DPT, PPS Coordinator 10/18/2013 4:00:00 PM

## 2013-10-31 ENCOUNTER — Ambulatory Visit (INDEPENDENT_AMBULATORY_CARE_PROVIDER_SITE_OTHER): Payer: BLUE CROSS/BLUE SHIELD | Admitting: Nurse Practitioner

## 2013-11-28 ENCOUNTER — Other Ambulatory Visit: Payer: Self-pay | Admitting: Neurological Surgery

## 2013-11-28 DIAGNOSIS — G060 Intracranial abscess and granuloma: Secondary | ICD-10-CM

## 2013-12-06 ENCOUNTER — Other Ambulatory Visit: Payer: Self-pay | Admitting: Neurological Surgery

## 2013-12-06 DIAGNOSIS — G060 Intracranial abscess and granuloma: Secondary | ICD-10-CM

## 2013-12-12 ENCOUNTER — Other Ambulatory Visit: Payer: Self-pay | Admitting: Neurological Surgery

## 2013-12-12 ENCOUNTER — Ambulatory Visit
Admission: RE | Admit: 2013-12-12 | Discharge: 2013-12-12 | Disposition: A | Discharge status: Home / Self Care | Payer: BLUE CROSS/BLUE SHIELD | Source: Ambulatory Visit | Attending: Neurological Surgery | Admitting: Neurological Surgery

## 2013-12-12 DIAGNOSIS — G060 Intracranial abscess and granuloma: Secondary | ICD-10-CM | POA: Insufficient documentation

## 2013-12-12 MED ORDER — GADOBUTROL 1 MMOL/ML IV SOLN
INTRAVENOUS | Status: DC
Start: 2013-12-12 — End: 2013-12-13
  Filled 2013-12-12: qty 10

## 2014-01-16 ENCOUNTER — Ambulatory Visit (INDEPENDENT_AMBULATORY_CARE_PROVIDER_SITE_OTHER): Payer: BLUE CROSS/BLUE SHIELD | Admitting: Neurology

## 2014-01-27 ENCOUNTER — Ambulatory Visit (INDEPENDENT_AMBULATORY_CARE_PROVIDER_SITE_OTHER): Payer: BLUE CROSS/BLUE SHIELD | Admitting: Neurology

## 2014-02-10 ENCOUNTER — Ambulatory Visit (INDEPENDENT_AMBULATORY_CARE_PROVIDER_SITE_OTHER): Payer: BLUE CROSS/BLUE SHIELD | Admitting: Neurology

## 2014-06-19 ENCOUNTER — Other Ambulatory Visit: Payer: Self-pay | Admitting: Neurological Surgery

## 2014-06-19 DIAGNOSIS — D496 Neoplasm of unspecified behavior of brain: Secondary | ICD-10-CM

## 2014-06-20 ENCOUNTER — Ambulatory Visit: Payer: BLUE CROSS/BLUE SHIELD | Attending: Neurological Surgery

## 2014-06-20 DIAGNOSIS — D496 Neoplasm of unspecified behavior of brain: Secondary | ICD-10-CM

## 2014-06-20 DIAGNOSIS — G060 Intracranial abscess and granuloma: Secondary | ICD-10-CM | POA: Insufficient documentation

## 2014-06-20 MED ORDER — GADOBUTROL 1 MMOL/ML IV SOLN
10.0000 mL | Freq: Once | INTRAVENOUS | Status: AC | PRN
Start: 2014-06-20 — End: 2014-06-20
  Administered 2014-06-20: 10 mmol via INTRAVENOUS
  Filled 2014-06-20: qty 10

## 2014-06-23 ENCOUNTER — Inpatient Hospital Stay: Payer: BLUE CROSS/BLUE SHIELD | Admitting: Hospitalist

## 2014-06-23 ENCOUNTER — Inpatient Hospital Stay
Admission: EM | Admit: 2014-06-23 | Discharge: 2014-07-08 | DRG: 024 | Disposition: A | Discharge status: Home Health | Payer: BLUE CROSS/BLUE SHIELD | Attending: Internal Medicine | Admitting: Internal Medicine

## 2014-06-23 ENCOUNTER — Inpatient Hospital Stay: Payer: BLUE CROSS/BLUE SHIELD

## 2014-06-23 DIAGNOSIS — G4733 Obstructive sleep apnea (adult) (pediatric): Secondary | ICD-10-CM | POA: Diagnosis present

## 2014-06-23 DIAGNOSIS — B999 Unspecified infectious disease: Secondary | ICD-10-CM

## 2014-06-23 DIAGNOSIS — Z881 Allergy status to other antibiotic agents status: Secondary | ICD-10-CM

## 2014-06-23 DIAGNOSIS — F411 Generalized anxiety disorder: Secondary | ICD-10-CM | POA: Diagnosis present

## 2014-06-23 DIAGNOSIS — E119 Type 2 diabetes mellitus without complications: Secondary | ICD-10-CM

## 2014-06-23 DIAGNOSIS — J322 Chronic ethmoidal sinusitis: Secondary | ICD-10-CM | POA: Diagnosis present

## 2014-06-23 DIAGNOSIS — R51 Headache: Secondary | ICD-10-CM

## 2014-06-23 DIAGNOSIS — G8929 Other chronic pain: Secondary | ICD-10-CM | POA: Diagnosis present

## 2014-06-23 DIAGNOSIS — J309 Allergic rhinitis, unspecified: Secondary | ICD-10-CM | POA: Diagnosis present

## 2014-06-23 DIAGNOSIS — E669 Obesity, unspecified: Secondary | ICD-10-CM | POA: Diagnosis present

## 2014-06-23 DIAGNOSIS — Z6841 Body Mass Index (BMI) 40.0 and over, adult: Secondary | ICD-10-CM

## 2014-06-23 DIAGNOSIS — J329 Chronic sinusitis, unspecified: Secondary | ICD-10-CM

## 2014-06-23 DIAGNOSIS — E785 Hyperlipidemia, unspecified: Secondary | ICD-10-CM | POA: Diagnosis present

## 2014-06-23 DIAGNOSIS — M952 Other acquired deformity of head: Secondary | ICD-10-CM | POA: Diagnosis present

## 2014-06-23 DIAGNOSIS — F17200 Nicotine dependence, unspecified, uncomplicated: Secondary | ICD-10-CM | POA: Diagnosis present

## 2014-06-23 DIAGNOSIS — G43909 Migraine, unspecified, not intractable, without status migrainosus: Secondary | ICD-10-CM | POA: Diagnosis present

## 2014-06-23 DIAGNOSIS — R519 Headache, unspecified: Secondary | ICD-10-CM | POA: Diagnosis present

## 2014-06-23 DIAGNOSIS — J323 Chronic sphenoidal sinusitis: Secondary | ICD-10-CM | POA: Diagnosis present

## 2014-06-23 DIAGNOSIS — J321 Chronic frontal sinusitis: Secondary | ICD-10-CM | POA: Diagnosis present

## 2014-06-23 DIAGNOSIS — Z885 Allergy status to narcotic agent status: Secondary | ICD-10-CM

## 2014-06-23 DIAGNOSIS — Z880 Allergy status to penicillin: Secondary | ICD-10-CM

## 2014-06-23 DIAGNOSIS — I10 Essential (primary) hypertension: Secondary | ICD-10-CM | POA: Diagnosis present

## 2014-06-23 DIAGNOSIS — Z794 Long term (current) use of insulin: Secondary | ICD-10-CM

## 2014-06-23 DIAGNOSIS — G060 Intracranial abscess and granuloma: Principal | ICD-10-CM

## 2014-06-23 HISTORY — DX: Hyperlipidemia, unspecified: E78.5

## 2014-06-23 LAB — CBC AND DIFFERENTIAL
Basophils Absolute Automated: 0.02 10*3/uL (ref 0.00–0.20)
Basophils Automated: 0 %
Eosinophils Absolute Automated: 0.22 10*3/uL (ref 0.00–0.70)
Eosinophils Automated: 2 %
Hematocrit: 44.6 % (ref 42.0–52.0)
Hgb: 15.1 g/dL (ref 13.0–17.0)
Immature Granulocytes Absolute: 0.04 10*3/uL
Immature Granulocytes: 0 %
Lymphocytes Absolute Automated: 2.5 10*3/uL (ref 0.50–4.40)
Lymphocytes Automated: 24 %
MCH: 30.3 pg (ref 28.0–32.0)
MCHC: 33.9 g/dL (ref 32.0–36.0)
MCV: 89.4 fL (ref 80.0–100.0)
MPV: 9.9 fL (ref 9.4–12.3)
Monocytes Absolute Automated: 0.59 10*3/uL (ref 0.00–1.20)
Monocytes: 6 %
Neutrophils Absolute: 7.16 10*3/uL (ref 1.80–8.10)
Neutrophils: 68 %
Nucleated RBC: 0 /100 WBC (ref 0–1)
Platelets: 266 10*3/uL (ref 140–400)
RBC: 4.99 10*6/uL (ref 4.70–6.00)
RDW: 13 % (ref 12–15)
WBC: 10.53 10*3/uL (ref 3.50–10.80)

## 2014-06-23 LAB — BASIC METABOLIC PANEL
BUN: 12 mg/dL (ref 9.0–28.0)
CO2: 22 mEq/L (ref 22–29)
Calcium: 9.2 mg/dL (ref 8.5–10.5)
Chloride: 105 mEq/L (ref 100–111)
Creatinine: 0.9 mg/dL (ref 0.7–1.3)
Glucose: 158 mg/dL — ABNORMAL HIGH (ref 70–100)
Potassium: 4.7 mEq/L (ref 3.5–5.1)
Sodium: 136 mEq/L (ref 136–145)

## 2014-06-23 LAB — GLUCOSE WHOLE BLOOD - POCT
Whole Blood Glucose POCT: 76 mg/dL (ref 70–100)
Whole Blood Glucose POCT: 149 mg/dL — ABNORMAL HIGH (ref 70–100)

## 2014-06-23 LAB — GFR: EGFR: >60

## 2014-06-23 SURGERY — PICC LINE PLACEMENT
Site: Arm Upper | Laterality: Left

## 2014-06-23 MED ORDER — HYDROMORPHONE HCL PF 1 MG/ML IJ SOLN
1.0000 mg | INTRAMUSCULAR | Status: DC | PRN
Start: 2014-06-23 — End: 2014-07-03
  Administered 2014-06-25 – 2014-07-03 (×4): 1 mg via INTRAVENOUS
  Filled 2014-06-23 (×4): qty 1

## 2014-06-23 MED ORDER — OXYCODONE HCL ER 10 MG PO T12A
10.0000 mg | EXTENDED_RELEASE_TABLET | Freq: Two times a day (BID) | ORAL | Status: DC | PRN
Start: 2014-06-23 — End: 2014-07-03
  Administered 2014-07-02 – 2014-07-03 (×2): 10 mg via ORAL
  Filled 2014-06-23 (×2): qty 1

## 2014-06-23 MED ORDER — INSULIN GLARGINE 100 UNIT/ML SC SOLN
38.0000 [IU] | Freq: Every morning | SUBCUTANEOUS | Status: DC
Start: 2014-06-23 — End: 2014-06-25
  Administered 2014-06-23 – 2014-06-25 (×3): 38 [IU] via SUBCUTANEOUS

## 2014-06-23 MED ORDER — VANCOMYCIN 1500 MG IN 500 ML NS (CNR)
1500.0000 mg | Freq: Two times a day (BID) | Status: DC
Start: 2014-06-23 — End: 2014-06-27
  Administered 2014-06-23 – 2014-06-27 (×8): 1500 mg via INTRAVENOUS
  Filled 2014-06-23 (×10): qty 500

## 2014-06-23 MED ORDER — IBUPROFEN 400 MG PO TABS
400.0000 mg | ORAL_TABLET | Freq: Four times a day (QID) | ORAL | Status: DC | PRN
Start: 2014-06-23 — End: 2014-07-08
  Administered 2014-06-24 – 2014-07-07 (×20): 400 mg via ORAL
  Filled 2014-06-23 (×20): qty 1

## 2014-06-23 MED ORDER — SODIUM CHLORIDE 0.9 % IV MBP
2.0000 g | Freq: Three times a day (TID) | INTRAVENOUS | Status: DC
Start: 2014-06-23 — End: 2014-07-08
  Administered 2014-06-23 – 2014-07-08 (×45): 2 g via INTRAVENOUS
  Filled 2014-06-23 (×46): qty 2

## 2014-06-23 MED ORDER — GLUCAGON 1 MG IJ SOLR (WRAP)
1.0000 mg | INTRAMUSCULAR | Status: DC | PRN
Start: 2014-06-23 — End: 2014-06-25

## 2014-06-23 MED ORDER — SIMVASTATIN 40 MG PO TABS
40.0000 mg | ORAL_TABLET | Freq: Every evening | ORAL | Status: DC
Start: 2014-06-23 — End: 2014-07-08
  Administered 2014-06-23 – 2014-07-07 (×15): 40 mg via ORAL
  Filled 2014-06-23 (×15): qty 1

## 2014-06-23 MED ORDER — GABAPENTIN 300 MG PO CAPS
300.0000 mg | ORAL_CAPSULE | Freq: Every day | ORAL | Status: DC
Start: 2014-06-23 — End: 2014-07-08
  Administered 2014-06-23 – 2014-07-08 (×81): 300 mg via ORAL
  Filled 2014-06-23 (×81): qty 1

## 2014-06-23 MED ORDER — OXYCODONE HCL 5 MG PO TABS
15.0000 mg | ORAL_TABLET | ORAL | Status: DC | PRN
Start: 2014-06-23 — End: 2014-07-08
  Administered 2014-07-02 – 2014-07-08 (×24): 15 mg via ORAL
  Filled 2014-06-23 (×24): qty 3

## 2014-06-23 MED ORDER — SODIUM CHLORIDE 0.9 % IV SOLN
INTRAVENOUS | Status: DC
Start: 2014-06-23 — End: 2014-06-24

## 2014-06-23 MED ORDER — HYDRALAZINE HCL 20 MG/ML IJ SOLN
10.0000 mg | Freq: Four times a day (QID) | INTRAMUSCULAR | Status: DC | PRN
Start: 2014-06-23 — End: 2014-07-08
  Administered 2014-07-01: 10 mg via INTRAVENOUS
  Filled 2014-06-23: qty 1

## 2014-06-23 MED ORDER — DEXTROSE 50 % IV SOLN
25.0000 mL | INTRAVENOUS | Status: DC | PRN
Start: 2014-06-23 — End: 2014-06-25

## 2014-06-23 MED ORDER — METRONIDAZOLE IN NACL 500 MG/100 ML IV SOLN
500.0000 mg | Freq: Three times a day (TID) | INTRAVENOUS | Status: DC
Start: 2014-06-23 — End: 2014-07-08
  Administered 2014-06-23 – 2014-07-08 (×44): 500 mg via INTRAVENOUS
  Filled 2014-06-23 (×43): qty 100

## 2014-06-23 MED ORDER — INSULIN ASPART 100 UNIT/ML SC SOLN
1.0000 [IU] | Freq: Every evening | SUBCUTANEOUS | Status: DC | PRN
Start: 2014-06-23 — End: 2014-06-25
  Administered 2014-06-24: 1 [IU] via SUBCUTANEOUS

## 2014-06-23 NOTE — Progress Notes (Signed)
PICC line ordered for IV antibiotics. Attempted and Failed PICC line placement in both arms. Please refer patient to IR For PICC.    I attempted the right brachial, basilic and cephalic striking each vein once, and I was unable to successfully access a vein, veins are too deep, poor visibility with ultrasound.  I attempted the left cephalic, and placed a line with blood return, however the X-ray revealed the line was in the axilla.  I subsequently pulled the PICC line, and placed a PIV in the left forearm.      Currently has a 20 gauge PIV that should hold up.  Has excellent veins in the forearms for future ultrasound guided PIVs.      Please refer patient to IR For PICC.

## 2014-06-23 NOTE — H&P (Signed)
ADMISSION HISTORY AND PHYSICAL EXAM    This patient is admitted to the CNS Hospitalist Service. A CNS MD is in-house 24/7. To contact the CNS MD, please page the listed attending CNS MD.  If no response, please page [phone] to reach the CNS MD on call.      Date Time: 06/23/2014 3:28 PM  Patient Name: [facility]  Attending Physician: Fransisco Hertz*  Primary Care Physician: Tressa Busman, MD    CC: recurrent brain abscess    Assessment:   Principal Problem:    Brain abscess  Active Problems:    Type 2 diabetes mellitus    Chronic headache    OSA (obstructive sleep apnea)    HLD (hyperlipidemia)    Sinusitis    HTN (hypertension)      48 yo m w/ h/o DM type 2, chronic headache, OSA, HLD, sinusitis, HTN, brain abscess s/p craniotomy, iv abx for MSSA p/w recurrent brain abscess.   Plan:   Admit to neuro tele - full inpt status.  NS consult - Dr. Cyndie Chime.  ID consult - Dr. Tyler Deis.  Continue current home meds.  Bld cx x 2.  ABX per ID.  Hydralazine iv prn SBP>150.  Accucheck, insulin sliding scale.  PICC line placement ordered in EPIC - Pt is a hard stick, needs PICC line for prolonged iv ABX use.  SCD's for DVT prophylaxis.      Disposition:     Today's date: 06/23/2014  Admit Date: 06/23/2014 12:06 PM  Anticipated medical stability for discharge:Red - not tomorrow - estimated month/date: 8/31  Service status: Inpatient: risk of morbidity and mortality  Reason for ongoing hospitalization: brain abscess  Anticipated discharge needs: f/u by NS, ID   History of Presenting Illness:   Jarreau Callanan is a 48 y.o. male w/ h/o DM type 2, chronic headache, OSA, HLD, sinusitis, HTN, brain abscess s/p craniotomy, iv Rocephin for MSSA who p/w recurrent brain abscess on recent MRI.  Pt reports bilateral retroorbital headaches weekly at around 2-3am which wake him up.  Pt reports intermittent photophobia but denies visual changes or N/V.  Pt denies F/C/D/SOB/CP/abd pain or any other sx's.  Pt was admitted to NSICU  on 09/30/13, d/c'ed to rehab on 10/07/14.  Pt was d/c'ed home on 10/18/13.  Past Medical History:     Past Medical History   Diagnosis Date   . Seasonal allergic rhinitis    . Diabetes mellitus    . Sinusitis    . Migraines    . HLD (hyperlipidemia)    . Brain abscess    . Sleep apnea 2000     c-pap   . Headache 1999     chronic       Available old records reviewed, including: EPIC   Past Surgical History:     Past Surgical History   Procedure Laterality Date   . Craniotomy, removal hematoma, subdural  09/30/2013     Procedure: CRANIOTOMY, REMOVAL HEMATOMA, SUBDURAL;  Surgeon: Madaline Savage, MD;  Location: [facility];  Service: Neurosurgery;  Laterality: N/A;   BI-FRONTAL CRANIOTOMY, FOR  EVACUATION OF RIGHT FRONTAL ABSCESS       Family History:     Family History   Problem Relation Age of Onset   . Stroke Neg Hx          Social History:     History   Smoking status   . Light Tobacco Smoker   . Types: Cigars   Smokeless tobacco   .  Never Used     Comment: Occasional Cigars     History   Alcohol Use   . 0.0 oz/week   . 4-5 Cans of beer per week     History   Drug Use No       Allergies:     Allergies   Allergen Reactions   . Morphine    . Penicillins        Medications:     Current/Home Medications    CEFTRIAXONE (ROCEPHIN) 2 G INJECTION    Inject 2,000 mg (2 g total) into the vein 2 (two) times daily.    FAMOTIDINE (PEPCID) 20 MG TABLET    Take 1 tablet (20 mg total) by mouth every 12 (twelve) hours.    GABAPENTIN (NEURONTIN) 300 MG CAPSULE    Take 1 capsule (300 mg total) by mouth every 8 (eight) hours.    INSULIN ASPART (NOVOLOG) 100 UNIT/ML INJECTION    Inject 1-12 Units into the skin as needed for High Blood Sugar.    INSULIN GLARGINE (LANTUS) 100 UNIT/ML INJECTION    Inject 38 Units into the skin every morning.    METFORMIN (GLUCOPHAGE) 1000 MG TABLET    Take 1 tablet (1,000 mg total) by mouth 2 (two) times daily with meals.    OXYCODONE (OXYCONTIN) 10 MG 12 HR TABLET    Take 1 tablet (10 mg total) by mouth  every 8 (eight) hours.    OXYCODONE (ROXICODONE) 15 MG IMMEDIATE RELEASE TABLET    Take 1 tablet (15 mg total) by mouth every 3 (three) hours as needed.    SALINE (OCEAN NASAL SPRAY) 0.65 % SOLUTION    2 sprays by Each Nare route as needed.    SENNA-DOCUSATE (PERICOLACE) 8.6-50 MG PER TABLET    Take 2 tablets by mouth nightly.    SIMVASTATIN (ZOCOR) 40 MG TABLET    Take 1 tablet (40 mg total) by mouth daily with dinner.         Method by which medications were confirmed on admission: Pt    Review of Systems:   All other systems were reviewed and are negative.   Physical Exam:   Patient Vitals for the past 24 hrs:   BP Temp Pulse Resp SpO2 Height Weight   06/23/14 1428 (!) 155/93 mmHg - 88 16 - - -   06/23/14 1202 (!) 180/100 mmHg 97 F (36.1 C) 93 16 94 % 1.778 m (5\' 10" ) 133.358 kg (294 lb)     Body mass index is 42.18 kg/(m^2).  No intake or output data in the 24 hours ending 06/23/14 1528    General: awake, alert, oriented x 3; no acute distress.  HEENT: perrla, eomi, sclera anicteric  oropharynx clear without lesions, mucous membranes moist  Neck: supple, no lymphadenopathy, no thyromegaly, no JVD, no carotid bruits  Cardiovascular: regular rate and rhythm, no murmurs, rubs or gallops  Lungs: clear to auscultation bilaterally, without wheezing, rhonchi, or rales  Abdomen: soft, non-tender, non-distended; no palpable masses, no hepatosplenomegaly, normoactive bowel sounds, no rebound or guarding  Extremities: no clubbing, cyanosis, or edema  Neuro: cranial nerves grossly intact, strength 5/5 in upper and lower extremities, sensation intact,   Skin: no rashes or lesions noted      I have personally reviewed the following labs and diagnostic studies.  Labs:     Results     Procedure Component Value Units Date/Time    Basic Metabolic Panel [[id number]]  (Abnormal) Collected:  06/23/14 1324  Specimen Information:  Blood Updated:  06/23/14 1349     Glucose 158 (H) mg/dL      BUN 16.1 mg/dL      Creatinine 0.9 mg/dL       CALCIUM 9.2 mg/dL      Sodium 096 mEq/L      Potassium 4.7 mEq/L      Chloride 105 mEq/L      CO2 22 mEq/L     GFR [[id number]] Collected:  06/23/14 1324     EGFR >60.0 Updated:  06/23/14 1349    CBC and differential [[id number]] Collected:  06/23/14 1324    Specimen Information:  Blood / Blood Updated:  06/23/14 1333     WBC 10.53 x10 3/uL      RBC 4.99 x10 6/uL      Hgb 15.1 g/dL      Hematocrit 21.3 %      MCV 89.4 fL      MCH 30.3 pg      MCHC 33.9 g/dL      RDW 13 %      Platelets 266 x10 3/uL      MPV 9.9 fL      Neutrophils 68 %      Lymphocytes Automated 24 %      Monocytes 6 %      Eosinophils Automated 2 %      Basophils Automated 0 %      Immature Granulocyte 0 %      Nucleated RBC 0 /100 WBC      Neutrophils Absolute 7.16 x10 3/uL      Abs Lymph Automated 2.50 x10 3/uL      Abs Mono Automated 0.59 x10 3/uL      Abs Eos Automated 0.22 x10 3/uL      Absolute Baso Automated 0.02 x10 3/uL      Absolute Immature Granulocyte 0.04 x10 3/uL           Imaging personally reviewed, including:     MRI brain w/ w/o contrast (06/20/14):    IMPRESSION:   1. Contraction of the previously visualized right frontal lobe abscess  cavity as above.  2. Abnormal enhancement along the right cribriform plate that extends into  the adjacent right frontal gyrus rectus and orbital gyrus with superimposed  abscess formation as above.  3. Enhancing soft tissue extending through the right lamina papyracea into  the medial right orbit consistent with subperiosteal abscess/phlegmon with  suspected involvement of the right medial rectus muscle as above.       Safety Checklist  DVT prophylaxis:  CHEST guideline (See page [id number]) Mechanical   Foley:  [place] Rn Foley protocol Not present   IVs:  Peripheral IV   PT/OT: Not needed   Daily CBC & or Chem ordered:  SHM/ABIM guidelines (see #5) No   Reference for approximate charges of common labs: CBC auto diff - $76  BMP - $99  Mg - $79    Signed by: Gardiner Sleeper, MD   YQ:[id number], Windy Kalata, MD

## 2014-06-23 NOTE — Consults (Signed)
06/23/2014 6:29 PM  [name] Brown,[name] Brown    Problem list:  Acute  Worsening headaches  New changes in MRI suggesting abscess in R cribriform plate/ R frontal lobe and medial R orbit/ medial rectus muscle  Prior MSSA brain abscess in 09/2013, treated with drainage and IV abx  -------------------------------------  Chronic conditions  Diabetes mellitus  Sleep apnea  Extensive history of recurrent sinusitis  -- multiple sinus surgeries, including at least two craniotomies    Impression:  [name] Brown is not toxic and does not seem to have signs of inflammation suggestive of an intracranial infection.  Nonetheless, he has had worsening headaches and the new MRI findings are not subtle.  Although the surgical team may require some time to create the right surgical approach, we should likely begin antibiotic therapy given the location of the abscesses.  Ideally we would like a sample for culture.    Recommendations:  Will start IV antibiotics today, covering hospital acquired bacteria, given extensive contact with the health system.  (vanco, flagyl, cefepime) Will work with surgeons regarding possible aspiration for culture and plan for definitive surgery.  Place PICC line, given poor venous access.  ----------------------------------------------------------------------  ----------------------------------------------------------------------  History of present illness:  Patient, with past history as documented, presents with worsening headache over the last 2-3 months and now with new MRI findings of new intracranial inflammatory collection that is suggestive of a new abscess.  He was admitted to start IV antibiotics and to allow the surgical team to design the best drainage, and possible reconstructive plan.  An ID consult was requested.    Past medical history:  Past Medical History   Diagnosis Date   . Seasonal allergic rhinitis    . Diabetes mellitus    . Sinusitis    . Migraines    . HLD (hyperlipidemia)    . Brain abscess    .  Sleep apnea 2000     c-pap   . Headache 1999     chronic   . HTN (hypertension)      Past Surgical History   Procedure Laterality Date   . Craniotomy, removal hematoma, subdural  09/30/2013     Procedure: CRANIOTOMY, REMOVAL HEMATOMA, SUBDURAL;  Surgeon: Madaline Savage, MD;  Location: [facility];  Service: Neurosurgery;  Laterality: N/A;   BI-FRONTAL CRANIOTOMY, FOR  EVACUATION OF RIGHT FRONTAL ABSCESS       Review of systems:  General: denies distress  HEENT: admits to worsening headache  Respiratory:admits to worsening sxs of sleep apnea  CV: denies chest pain  Gastrointestinal: denies diarrhea  GU: denies dysuria  Musculoskeletal: denies muscle pain or arthritis  Hematologic: denies anemia  Neurologic: denies neuritis  Psychiatric: denies depression    Family history:  History reviewed. No pertinent family history.    Social history:  History     Social History   . Marital Status: Married     Spouse Name: N/A     Number of Children: N/A   . Years of Education: N/A     Social History Main Topics   . Smoking status: Light Tobacco Smoker     Types: Cigars   . Smokeless tobacco: Never Used      Comment: Occasional Cigars   . Alcohol Use: 0.0 oz/week     4-5 Cans of beer per week   . Drug Use: No   . Sexual Activity: Not on file     Other Topics Concern   . Not on file  Social History Narrative       Exam:  Filed Vitals:    06/23/14 1428   BP: 155/93   Pulse: 88   Temp:    Resp: 16   SpO2:      General: awake, alert no distress  HEENT: symmetric face, cranial nerves normal, no conjuctival injection  Lungs: clear  CV: normal  Abdomen: soft, obese, nontender  Skin: no rash  Extremities: normal  Neuro: nonfocal    Labs:      Recent Labs  Lab 06/23/14  1324   WBC 10.53   HEMOGLOBIN 15.1   HEMATOCRIT 44.6   PLATELETS 266       Recent Labs  Lab 06/23/14  1324   SODIUM 136   POTASSIUM 4.7   CHLORIDE 105   CO2 22   BUN 12.0   CREATININE 0.9   CALCIUM 9.2   GLUCOSE 158*          Radiographic studies:  Head MRI:  1.  Contraction of the previously visualized right frontal lobe abscess  cavity as above.  2. Abnormal enhancement along the right cribriform plate that extends into  the adjacent right frontal gyrus rectus and orbital gyrus with superimposed  abscess formation as above.  3. Enhancing soft tissue extending through the right lamina papyracea into  the medial right orbit consistent with subperiosteal abscess/phlegmon with  suspected involvement of the right medial rectus muscle as above.     Attestations:  I certify that I personally reviewed the medical record, including prior notes in the electronic health record, I have interviewed the patient, performed a physical examination, as documented above, and reviewed the referenced laboratory and radiographic studies.  I have discussed my thoughts with the patient and with relevant medical team members.  I have given thought to the complex medical conditions present and have endeavored to balance the interventions required by the acute conditions with the potential toxicities of the medications and procedures on the patient's well being and on the status of the other chronic conditions. I have considered the potential drug interactions between anti-microbial agents we have recommended and other medications required by the patient.   I have documented my recommendations for the primary attending, the medical trainees and the other medical and/or surgical consultants involved in caring for the patient.  A total of 65  minutes were spent in the care of this patient.    Signed by: Guadalupe Maple, MD

## 2014-06-23 NOTE — Consults (Signed)
NEUROSURGERY CONSULTATION    Date Time: 06/23/2014 2:36 PM  Patient Name: [name]  Requesting Physician: Fransisco Hertz*  Consulting Physician: Dr. Ned Card  Covered By: Neurosurgery Deland Pretty PA  Time of Exam: [phone]     Reason for Consultation:   Brain abscess found on MRI    History:   [name] Brown is a 48 y.o. male who presents to the hospital on 06/23/2014 with recurrent brain abscess found on contrast MRI. Pt reports that he had a previous brain abscess evacuated surgically with Dr. Jaynie Collins approx 1 year ago and at that point the abscess was MSSA (+). Pt states he was completing routine FU with Dr. Jaynie Collins when a repeat MRI was completed and found a recurrence. Pt reports HA located behind his eyes, 1x per week at approx 2-3am that wakes him up. Pt c/o photophobia at all times but denies vision changes, N/V.     When asked about recent infections, the pt states approx 1 month ago he had a URI with persistent cough, treated by his PCP with ABX without success.      Past Medical History:     Past Medical History   Diagnosis Date   . Seasonal allergic rhinitis    . Diabetes mellitus    . Sinusitis    . Migraines    . HLD (hyperlipidemia)    . Brain abscess        Past Surgical History:     Past Surgical History   Procedure Laterality Date   . Craniotomy, removal hematoma, subdural  09/30/2013     Procedure: CRANIOTOMY, REMOVAL HEMATOMA, SUBDURAL;  Surgeon: Madaline Savage, MD;  Location: [facility];  Service: Neurosurgery;  Laterality: N/A;   BI-FRONTAL CRANIOTOMY, FOR  EVACUATION OF RIGHT FRONTAL ABSCESS       Family History:   History reviewed. No pertinent family history.    Social History:     History     Social History   . Marital Status: Married     Spouse Name: N/A     Number of Children: N/A   . Years of Education: N/A     Social History Main Topics   . Smoking status: Light Tobacco Smoker     Types: Cigars   . Smokeless tobacco: Never Used      Comment: Occasional Cigars   . Alcohol Use: 0.0  oz/week     4-5 Cans of beer per week   . Drug Use: No   . Sexual Activity: Not on file     Other Topics Concern   . Not on file     Social History Narrative       Allergies:     Allergies   Allergen Reactions   . Morphine    . Penicillins        Medications:       Review of Systems:   A comprehensive review of systems was: History obtained from the patient. All other systems were reviewed and are negative    Physical Exam:     Filed Vitals:    06/23/14 1428   BP: 155/93   Pulse: 88   Temp:    Resp: 16   SpO2:        Intake and Output Summary (Last 24 hours) at Date Time  No intake or output data in the 24 hours ending 06/23/14 1436  Physical Exam  General: Patient is well developed and well nourished and obese,  in  no acute distress. Cooperative with the examination. Blood pressure is stable, pulse is regular, temperature is afebrile, and respiratory rate is regular.   Mental Status: The patient is awake, alert, appropriate. Recent and remote memory are normal. Attention span, concentration, and language function are normal. There is no evidence of aphasia in conversational speech. There is no agitation, delirium, depressed affect or evidence of delusions or hallucinations. The patient's affect is normal.     Physical Exam:   Heart: RRR, no murmurs, rubs or gallops  Lungs: CTA bilaterally    NEURO:  PERRL 3mm, EOMI. No nystagmus.   Face is symmetric and tongue midline  Speech fluent and appropriate  Motor:   BUE 5/5 strength: bicep, tricep, grip, interosseous    BLE 5/5 strength: quadriceps, hamstrings, AT, EHL  Muscle tone is normal. There is no cogwheel rigidity.   Sensory: Sensory examination including light touch reveals no abnormality in upper and lower extremities.   Reflexes:   Patellar and achilles DTR's 2+ and symmetric.   Neg babinski  Neg clonus    GCS:15       Labs Reviewed:     Lab Results   Component Value Date    WBC 10.53 06/23/2014    HGB 15.1 06/23/2014    HCT 44.6 06/23/2014    MCV 89.4  06/23/2014    PLT 266 06/23/2014     Lab Results   Component Value Date    NA 136 06/23/2014    K 4.7 06/23/2014    CL 105 06/23/2014    CO2 22 06/23/2014     Lab Results   Component Value Date    INR 1.0 09/30/2013    PT 12.9 09/30/2013       Rads:     Radiology Results (24 Hour)     ** No results found for the last 24 hours. **          Assessment:   48 y/o M, h/o brain abscess, chronic sinus infections presents with MRI findings of recurrent brain abscess    Plan:   - Admit to Neuroscience hospital medicine  - Recommend C/S infectious disease  - NS will continue to follow  - Pending ID workup, may anticipate surgical intervention    Discussed patient and plan with Dr. Cyndie Chime, who agreed.    Signed by: Barrett Shell  Date/Time: 06/23/2014 2:36 PM      Patient with possible recurrent frontal abscess  Admit for ID management  Likely need neurosurgery/plastics procedure after antibiotics    I agree with the above finding and plan of care.    Ned Card, MD  Neurosurgery

## 2014-06-23 NOTE — Plan of Care (Signed)
Problem: Safety  Goal: Patient will be free from injury during hospitalization  Intervention: Assess patient's risk for falls and implement fall prevention plan of care per policy  Pt admitted from ED with DX: Brain Abcess, infecion per PCP MRI done as outpt. AOX4, MAEx4,clear speech, 02 sats on RA >92%.  Report HA at 2-3 which is controlled, denies any  or other discomforts at this time. Pt placed on telemetry, oriented to room and call bell system, wife at bedside.  Falls safety plan and plan of care reviewed with pt and verbalized understanding.  Awaits ID consult.  Plan:  Will follow admission orders, continue to asses pt status, keep NPO per orders,hourly rounding.

## 2014-06-23 NOTE — ED Provider Notes (Signed)
Physician/Midlevel provider first contact with patient: 06/23/14 1213                                      [name] EMERGENCY DEPARTMENT H&P         CLINICAL SUMMARY          Diagnosis:    .     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 Normal neuro exam, no need for intubation or intensive care. MRI images are not available for my review as patient left CD at home. NSG and neuro hospitalist consulted for admission. No need for emergent abx as pt HDS. Low concern for alternate diagnosis of SAH, ICH, meningitis as patient with normal neuro exam and relatively asymptomatic prior to screening MRI.        Disposition:          ED Disposition     Admit Admitting Physician: Durenda Hurt [[id number]]  Diagnosis: Brain abscess [409811]  Estimated Length of Stay: 3 - 5 Days  Tentative Discharge Plan?: Home-Health Care Svc [6]  Patient Class: Inpatient [101]  I certify that inpatient services are medically necessary for this patient. Please see H&P and MD progress notes for additional information about the patient's course of treatment. For Medicare patients, services provided in accordance with 412.3 and expected LOS to be greater than 2 midnights for Medicare patients.: Yes                      CLINICAL INFORMATION        HPI:      Chief Complaint: Headache; Dizziness; and Abnormal MRI  .    [name] Brown is a 48 y.o. male with h/o brain abscess last December who presents after a 6 month screening MRI showing brain abscess. Pt called his neurologist, Dr. Jaynie Collins, who told him to come to the ED for admission.The MRI showed returning infection so pt was told he needs to return for admission. The patient states he has had chronic headaches and currently has one now that is associated with photophobia. He takes Motrin with little relief and rates his current headache 5/10.    Abscess was cultured and found to be  MSSA last December.     Neurologist: Dr. Vivia Budge.  Neuro PA: Deland Pretty [phone]    Infectious Disease Doc: [id number]        History obtained from: Patient      ROS:      Constitution: No fevers. No chills. No night sweats.  HEENT: Normal hearing. No vision loss. + photophobia, + chronic sinus pressure  Resp: Denies cough, shortness of breath.  Cardiovasular: No chest pain. No diaphoresis.   GI: No abdominal pain. No vomiting. No diarrhea.   Musculoskeletal: No weakness, pain.  Neuro: + headache. No disorientation. No focal weakness, no word finding difficulty  Skin: No rash. No pruritis.  Heme: No easy bruising or bleeding.    Positive and negative ROS elements as per HPI.  All other systems reviewed and negative.      Physical Exam:      Pulse 93  BP (!) 180/100 mmHg  Resp 16  SpO2 94 %  Temp 97 F (36.1 C)  PHYSICAL EXAM:   GENERAL: Vital signs reviewed, Patient has normal pulse, Patient has elevated blood pressure, Patient has normal respiratory rate, Well appearing, Patient appears comfortable, in no acute distress.  HEAD: Atraumatic, Normocephalic.   EYES: Eyes are normal to inspection, Pupils equal, round and reactive to light, No discharge from eyes, Extraocular muscles intact, Sclera are normal, Conjunctiva are normal.   ENT: Moist mucous membranes, normal exam.   NECK: Normal ROM, trachea midline  CHEST: Clear to auscultation bilaterally, equal expansion.  CARDIOVASCULAR: RRR, no m/r/g, no peripheral edema. 2+ radial and DP pulses b/l.  ABDOMEN: Soft/non-tender. No rebound or guarding.   BACK: No tenderness to palpation, C/T/L spine midline  EXTREMITIES: Inspection normal, No cyanosis, No clubbing, No edema, Normal range of motion.   NEURO: GCS is 15, No focal motor deficits, Speech normal. NEURO: Symmetric smile, tongue and uvula midline, trigeminal sensory nerve intact b/l, SCM 5/5 b/l, 5/5 flexion and extension of upper and lower extremities, finger to nose intact, no pronator drift.  Normal fluent speech. Normal gait.  SKIN: Skin is warm, dry, intact.   PSYCHIATRIC: Alert  & oriented X 3 Anxious with flat affect.            PAST HISTORY        Primary Care Provider: Tressa Busman, MD        PMH/PSH:    .     Past Medical History   Diagnosis Date   . Seasonal allergic rhinitis    . Diabetes mellitus    . Sinusitis    . Migraines    . HLD (hyperlipidemia)    . Brain abscess    . Sleep apnea 2000     c-pap   . Headache 1999     chronic   . HTN (hypertension)        He has past surgical history that includes CRANIOTOMY, REMOVAL HEMATOMA, SUBDURAL (09/30/2013).      Social/Family History:      He reports that he has been smoking Cigars.  He has never used smokeless tobacco. He reports that he drinks alcohol. He reports that he does not use illicit drugs.    Family History   Problem Relation Age of Onset   . Stroke Neg Hx          Listed Medications on Arrival:    .     Current Discharge Medication List      CONTINUE these medications which have NOT CHANGED    Details   cefTRIAXone (ROCEPHIN) 2 G injection Inject 2,000 mg (2 g total) into the vein 2 (two) times daily.  Qty: 1 each, Refills: 0      famotidine (PEPCID) 20 MG tablet Take 1 tablet (20 mg total) by mouth every 12 (twelve) hours.  Refills: 0      gabapentin (NEURONTIN) 300 MG capsule Take 1 capsule (300 mg total) by mouth every 8 (eight) hours.  Qty: 90 capsule, Refills: 0    Associated Diagnoses: Brain abscess      insulin aspart (NOVOLOG) 100 UNIT/ML injection Inject 1-12 Units into the skin as needed for High Blood Sugar.  Qty: 10 mL, Refills: 0      insulin glargine (LANTUS) 100 UNIT/ML injection Inject 38 Units into the skin every morning.  Qty: 10 mL, Refills: 0    Associated Diagnoses: DM (diabetes mellitus)      metFORMIN (GLUCOPHAGE) 1000 MG tablet Take 1 tablet (1,000 mg total) by mouth 2 (two) times  daily with meals.  Qty: 60 tablet, Refills: 0    Associated Diagnoses: DM (diabetes mellitus)      oxyCODONE (OXYCONTIN)  10 MG 12 hr tablet Take 1 tablet (10 mg total) by mouth every 8 (eight) hours.  Qty: 90 tablet, Refills: 0    Associated Diagnoses: Brain abscess      oxyCODONE (ROXICODONE) 15 MG immediate release tablet Take 1 tablet (15 mg total) by mouth every 3 (three) hours as needed.  Qty: 120 tablet, Refills: 0    Associated Diagnoses: Brain abscess      saline (OCEAN NASAL SPRAY) 0.65 % Solution 2 sprays by Each Nare route as needed.  Refills: 0      senna-docusate (PERICOLACE) 8.6-50 MG per tablet Take 2 tablets by mouth nightly.  Refills: 0      simvastatin (ZOCOR) 40 MG tablet Take 1 tablet (40 mg total) by mouth daily with dinner.  Qty: 30 tablet, Refills: 0    Associated Diagnoses: DM (diabetes mellitus)            Allergies: He is allergic to morphine and penicillins.            VISIT INFORMATION        Clinical Course in the ED:      1:08 PM  Spoke to on-call PA for Dr. Jaynie Collins. No abx at this time as pt is non-toxic. Want pt admitted to CNS hospitalist. Will come see the patient.     1:31pm  Spoke with Dr. Durene Cal, neuro-hospitalist. Discussed the patient's case and that no antibiotics given per NSG request. She advised admission with in-patient status to a neuro tele bed under Dr. Dwyane Dee. Patient updated.      Medications Given in the ED:    .     ED Medication Orders     None            Procedures:      Procedures      Interpretations:      Differential Diagnosis (not inclusive): SAH, ICH, sinusitis, brain abscess, meningitis    Pulse Ox Analysis: Yes: saturation: 94 %; Oxygen use: room air; Interpretation: borderline normal      Critical Care Time: No Critical Care Time                 RESULTS        Lab Results:      Results     Procedure Component Value Units Date/Time    Glucose Whole Blood - POCT [[id number]]  (Abnormal) Collected:  06/26/14 0709     POCT - Glucose Whole blood 102 (H) mg/dL Updated:  40/98/11 9147    Basic Metabolic Panel [[id number]]  (Abnormal) Collected:  06/26/14 0417    Specimen Information:   Blood Updated:  06/26/14 0509     Glucose 102 (H) mg/dL      BUN 86.5 mg/dL      Creatinine 0.7 mg/dL      CALCIUM 8.3 (L) mg/dL      Sodium 784 mEq/L      Potassium 4.2 mEq/L      Chloride 108 mEq/L      CO2 24 mEq/L     GFR [[id number]] Collected:  06/26/14 0417     EGFR >60.0 Updated:  06/26/14 0509    CULTURE BLOOD AEROBIC AND ANAEROBIC [[id number]] Collected:  06/23/14 2124    Specimen Information:  Blood, Venipuncture Updated:  06/25/14 2321    Narrative:  ORDER#: [id number]                                    ORDERED BY: Candy Sledge  SOURCE: Blood, Venipuncture rue                      COLLECTED:  06/23/14 21:24  ANTIBIOTICS AT COLL.:                                RECEIVED :  06/23/14 23:00  Culture Blood Aerobic and Anaerobic        PRELIM      06/25/14 23:21  06/24/14   No Growth after 1 day/s of incubation.  06/25/14   No Growth after 2 day/s of incubation.      CULTURE BLOOD AEROBIC AND ANAEROBIC [[id number]] Collected:  06/23/14 2055    Specimen Information:  Blood, Venipuncture Updated:  06/25/14 2221    Narrative:      ORDER#: [id number]                                    ORDERED BY: Candy Sledge  SOURCE: Blood, Venipuncture lue                      COLLECTED:  06/23/14 20:55  ANTIBIOTICS AT COLL.:                                RECEIVED :  06/23/14 22:09  Culture Blood Aerobic and Anaerobic        PRELIM      06/25/14 22:21  06/24/14   No Growth after 1 day/s of incubation.  06/25/14   No Growth after 2 day/s of incubation.      Glucose Whole Blood - POCT [[id number]]  (Abnormal) Collected:  06/25/14 2121     POCT - Glucose Whole blood 118 (H) mg/dL Updated:  62/95/28 4132    Glucose Whole Blood - POCT [[id number]] Collected:  06/25/14 1638     POCT - Glucose Whole blood 90 mg/dL Updated:  36/64/40 3474    Glucose Whole Blood - POCT [[id number]]  (Abnormal) Collected:  06/25/14 1249     POCT - Glucose Whole blood 225 (H) mg/dL Updated:  64/33/29 5188    Glucose Whole Blood - POCT [[id number]]   (Abnormal) Collected:  06/25/14 1153     POCT - Glucose Whole blood 206 (H) mg/dL Updated:  60/10/93 2355              Radiology Results:      None; patient does not have MRI read or images availabel.            Scribe Attestation:      I was acting as a Neurosurgeon for [employer], Brantley Persons MD on [facility]  Treatment Team: Scribe: Nancy Marus     I am the first provider for this patient and I personally performed the services documented. Treatment Team: Scribe: Nancy Marus is scribing for me on [facility]. This note accurately reflects work and decisions made by me.    Aalijah Mims L. MD  Zonya Gudger, Arlyss Gandy, MD  06/26/14 [phone]

## 2014-06-23 NOTE — ED Notes (Signed)
MD at bedside. 

## 2014-06-24 LAB — GLUCOSE WHOLE BLOOD - POCT
Whole Blood Glucose POCT: 88 mg/dL (ref 70–100)
Whole Blood Glucose POCT: 118 mg/dL — ABNORMAL HIGH (ref 70–100)
Whole Blood Glucose POCT: 118 mg/dL — ABNORMAL HIGH (ref 70–100)
Whole Blood Glucose POCT: 205 mg/dL — ABNORMAL HIGH (ref 70–100)

## 2014-06-24 MED ORDER — HEPARIN SODIUM (PORCINE) 5000 UNIT/ML IJ SOLN
5000.0000 [IU] | Freq: Three times a day (TID) | INTRAMUSCULAR | Status: DC
Start: 2014-06-24 — End: 2014-06-25
  Administered 2014-06-24 – 2014-06-25 (×3): 5000 [IU] via SUBCUTANEOUS
  Filled 2014-06-24 (×3): qty 1

## 2014-06-24 MED ORDER — INSULIN GLARGINE 100 UNIT/ML SC SOLN
10.0000 [IU] | Freq: Every evening | SUBCUTANEOUS | Status: DC
Start: 2014-06-24 — End: 2014-06-25
  Administered 2014-06-24: 10 [IU] via SUBCUTANEOUS

## 2014-06-24 NOTE — Plan of Care (Signed)
Problem: Health Promotion  Goal: Knowledge - disease process  Extent of understanding conveyed about a specific disease process.   Outcome: Progressing  Pt is A & O X 4. C/o moderate headache, medicated with Ibuprofen 400 mg. Pt reported effective pain relief. Vitals stable. On oxygen at 2L, no respiratory distress noted. Pt moves all extremities, ambulating independently. Unable to place PICC line by PICC nurse. Pt may need PICC under IR. Blood cultures sent. Reminded pt about fall precaution. Call bell in reach. Will continue to monitor.

## 2014-06-24 NOTE — Progress Notes (Signed)
On call RT Ronny Flurry) notified regarding pt's need of CPAP for sleep apnea tonight. RT will bring CPAP tonight.

## 2014-06-24 NOTE — Progress Notes (Signed)
Patient confirmed that he uses CPAP at home with a pressure of 10cmH2O. He uses the same setting on our hospital CPAP unit.

## 2014-06-24 NOTE — Progress Notes (Signed)
ID PROGRESS NOTE    Date Time: 06/24/2014 12:22 PM  Patient Name: [name] Brown      Problem List:    Acute  Worsening headaches  New changes in MRI suggesting abscess in R cribriform plate/ R frontal lobe and medial R orbit/ medial rectus muscle  Prior MSSA brain abscess in 09/2013, treated with drainage and IV abx  -------------------------------------  Chronic conditions  Diabetes mellitus  Sleep apnea  Extensive history of recurrent sinusitis  -- multiple sinus surgeries, including at least two craniotomies    Assessment:   Suspected recurrence of brain abscess.  Relative paucity of Sx.  Decision regarding neurosurgical intervention is pending.  Pt remains on broad spectrum coverage    Antibiotics:   #2 Vancomycin 1.5 gm IV bID  #2 Cefepime 2 gm IV q 8  #2 Metronidazole 500mg  IV q 8    Plan:   COntinue current Abx regimen  Awaiting neurosurgical plans    Lines:   piv    Family History:     Family History   Problem Relation Age of Onset   . Stroke Neg Hx        Social History:     History     Social History   . Marital Status: Married                      Social History Main Topics   . Smoking status: Light Tobacco Smoker     Types: Cigars   . Smokeless tobacco: Never Used      Comment: Occasional Cigars   . Alcohol Use: 0.0 oz/week     4-5 Cans of beer per week   . Drug Use: No          Allergies:     Allergies   Allergen Reactions   . Morphine    . Penicillins        Review of Systems:   No events overnight.  + frontal HA    Physical Exam:     Filed Vitals:    06/24/14 1210   BP: 151/68   Pulse: 65   Temp: 96.6 F (35.9 C)   Resp: 18   SpO2: 96%       General Appearance: alert and appropriate, non-toxic  Neuro: alert, oriented, normal speech, normal attention and cognition  HEENT: no scleral icterus, pupils round and reactive, OP clear, NCAT, EOMI  Neck: supple  Extremities: no pedal edema, No c/c  Skin: no rash  Psych: normal mood and affect    Labs:     Lab Results   Component Value Date    WBC 10.53  06/23/2014    HGB 15.1 06/23/2014    HCT 44.6 06/23/2014    MCV 89.4 06/23/2014    PLT 266 06/23/2014     Lab Results   Component Value Date    CREAT 0.9 06/23/2014     Lab Results   Component Value Date    ALT 52 10/17/2013    AST 39* 10/17/2013    ALKPHOS 74 10/17/2013    BILITOTAL 0.4 10/17/2013     No results found for: LACTATE    Microbiology:     Microbiology Results     Procedure Component Value Units Date/Time    CULTURE BLOOD AEROBIC AND ANAEROBIC [[id number]] Collected:  06/23/14 2124    Specimen Information:  Blood, Venipuncture Updated:  06/23/14 2300    Narrative:      1 BLUE+1 PURPLE  CULTURE BLOOD AEROBIC AND ANAEROBIC [[id number]] Collected:  06/23/14 2055    Specimen Information:  Blood, Venipuncture Updated:  06/23/14 2209    Narrative:      1 BLUE+1 PURPLE          Rads:   Xr Chest Ap Portable    06/23/2014    Aberrant/coiled left upper extremity PICC.  These findings were discussed with the patient's nurse, Sapana, at 9:30 PM. The PICC has reportedly been removed in the interval.  Prince Solian, MD  06/23/2014 9:35 PM       Signed by: Micheline Rough, MD

## 2014-06-24 NOTE — Progress Notes (Signed)
CNS HOSPITALIST PROGRESS NOTE    Date Time: 06/24/2014 11:07 AM  Patient Name: [name]  Attending Physician: Durenda Hurt, MD    Assessment:   1. Brain abscess   2. 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 Extensive history of recurrent sinusitis  4. DM  5. OSA  6. HTN  7. HLD  8. Chronic headache     Plan:    Continue with vancomycin, flagyl and cefepime. ID following. NSR following, waiting for the recommendation    Continue with Lantus, SSI   Continue statin   Continue neurontin    Pain control     Case discussed with: pt and wife     Safety Checklist:     DVT prophylaxis:  CHEST guideline (See page [id number]) Mechanical and chemical    Foley:  [place] Rn Foley protocol Not present   IVs:  Peripheral IV   PT/OT: Not needed   Daily CBC & or Chem ordered:  SHM/ABIM guidelines (see #5) No       Lines:     Active PICC Line / CVC Line / PIV Line / Drain / Airway / Intraosseous Line / Epidural Line / ART Line / Line / Wound / Pressure Ulcer / NG/OG Tube     Name:   Placement date:   Placement time:   Site:   Days:    Peripheral IV 06/23/14 Left Forearm  06/23/14      Forearm   1           Disposition:     Today's date: 06/24/2014  Length of Stay: 1  Anticipated medical stability for discharge: 1-2 days   Reason for ongoing hospitalization: brain abscess   Anticipated discharge needs: TBD    Subjective     CC: Brain abscess    Interval History/24 hour events/subjective: mild headache, no numbness and tingling and no weakness. No blurry vision. No nausea and vomiting. No fevers and chills     HPI: [name] Brown is a 48 y.o. male w/ h/o DM type 2, chronic headache, OSA, HLD, sinusitis, HTN, brain abscess s/p craniotomy, iv Rocephin for MSSA who p/w recurrent brain abscess on recent MRI. Pt reports bilateral retroorbital headaches weekly at around 2-3am which wake him up. Pt reports intermittent photophobia but denies visual changes or N/V. Pt denies F/C/D/SOB/CP/abd pain or any other sx's. Pt was admitted to  NSICU on 09/30/13, d/c'ed to rehab on 10/07/14. Pt was d/c'ed home on 10/18/13.    Review of Systems:     See above    Physical Exam:     VITAL SIGNS PHYSICAL EXAM   Temp:  [96.3 F (35.7 C)-97.7 F (36.5 C)] 96.4 F (35.8 C)  Heart Rate:  [63-93] 70  Resp Rate:  [14-18] 16  BP: (117-180)/(64-100) 117/64 mmHg  Blood Glucose:    Telemetry: NSR      Intake/Output Summary (Last 24 hours) at 06/24/14 1107  Last data filed at 06/24/14 0454   Gross per 24 hour   Intake   1400 ml   Output      0 ml   Net   1400 ml    General: No apparent distress, comfortable appearing, vital signs noted   Eyes: Anicteric sclerae, moist conjunctiva, PERRLA   ENT: Oropharynx clear; no erythema or exudates;   Neck: supple; FROM; no mass   Resp: Clear to ausculation, no wheezing, rhonci, or rales noted; normal respiratory effort   CV: Regular rate and rhythm,  no murmurs, gallops, or rubs; no peripheral edema   GI: Abdomen obese, soft, non-tender, no masses; no hepatosplenomegaly   Skin: Normal temperature, tone, turgor; no rash, lesions, ulcers   Musc: No digital cyanosis or ischemia   Neuro: AOx3, CN 2-12 grossly intact, power 5/5 allover, sensation intact, DTR +1         Meds:     Medications were reviewed:    Labs:     Labs (last 72 hours):      Recent Labs  Lab 06/23/14  1324   WBC 10.53   HEMOGLOBIN 15.1   HEMATOCRIT 44.6   PLATELETS 266       No results for input(s): PT, INR, PTT in the last 168 hours.   Recent Labs  Lab 06/23/14  1324   SODIUM 136   POTASSIUM 4.7   CHLORIDE 105   CO2 22   BUN 12.0   CREATININE 0.9   CALCIUM 9.2   GLUCOSE 158*                   Microbiology, reviewed and are significant for:  Microbiology Results     Procedure Component Value Units Date/Time    CULTURE BLOOD AEROBIC AND ANAEROBIC [[id number]] Collected:  06/23/14 2124    Specimen Information:  Blood, Venipuncture Updated:  06/23/14 2300    Narrative:      1 BLUE+1 PURPLE    CULTURE BLOOD AEROBIC AND ANAEROBIC [[id number]] Collected:  06/23/14 2055     Specimen Information:  Blood, Venipuncture Updated:  06/23/14 2209    Narrative:      1 BLUE+1 PURPLE          Imaging, reviewed and are significant for:  Radiology Results (24 Hour)     Procedure Component Value Units Date/Time    XR Chest AP Portable [[id number]] Collected:  06/23/14 2125    Order Status:  Completed Updated:  06/23/14 2139    Narrative:      HISTORY: PICC placement.    COMPARISON: 10/01/2013.    FINDINGS: Left upper extremity PICC is coiled. The tip extends  retrograde in the left upper extremity, only partially visualized.  Overlying leads limit assessment somewhat. Low lung volumes,  cardiomediastinal silhouette and remainder of the chest are stable post  extubation and enteric tube removal.      Impression:       Aberrant/coiled left upper extremity PICC.    These findings were discussed with the patient's nurse, Sapana, at 9:30  PM. The PICC has reportedly been removed in the interval.    Prince Solian, MD   06/23/2014 9:35 PM              Signed by: Durenda Hurt, MD

## 2014-06-24 NOTE — Progress Notes (Signed)
Events noted  Appreciate ID input    Plan had been discussed between Drs Tyler Deis and Jaynie Collins last week.  Cont IV antibiotics per ID  Tentative plan for repeat craniotomy, resection of abscess for 9/4 with Drs. Jaynie Collins and Nicolette Bang (plastics)     Ned Card, MD  Neurosurgery

## 2014-06-24 NOTE — Plan of Care (Signed)
AOx4, MAE, no neurodeficit, c/o mild to moderate HA, medicated Gabapentin and Motrin given, ambulates independently, steady gait, had BM this shift, ivf ns d/c'd, call bell within reach, family at bedside. cont. Iv abx.

## 2014-06-25 LAB — GLUCOSE WHOLE BLOOD - POCT
Whole Blood Glucose POCT: 91 mg/dL (ref 70–100)
Whole Blood Glucose POCT: 206 mg/dL — ABNORMAL HIGH (ref 70–100)
Whole Blood Glucose POCT: 225 mg/dL — ABNORMAL HIGH (ref 70–100)
Whole Blood Glucose POCT: 90 mg/dL (ref 70–100)
Whole Blood Glucose POCT: 118 mg/dL — ABNORMAL HIGH (ref 70–100)

## 2014-06-25 MED ORDER — INSULIN ASPART 100 UNIT/ML SC SOLN
1.0000 [IU] | Freq: Three times a day (TID) | SUBCUTANEOUS | Status: DC | PRN
Start: 2014-06-25 — End: 2014-06-26
  Administered 2014-06-25: 2 [IU] via SUBCUTANEOUS

## 2014-06-25 MED ORDER — GLUCAGON 1 MG IJ SOLR (WRAP)
1.0000 mg | INTRAMUSCULAR | Status: DC | PRN
Start: 2014-06-25 — End: 2014-07-08

## 2014-06-25 MED ORDER — METFORMIN HCL 500 MG PO TABS
1000.0000 mg | ORAL_TABLET | Freq: Two times a day (BID) | ORAL | Status: DC
Start: 2014-06-25 — End: 2014-07-02
  Administered 2014-06-25 – 2014-06-30 (×11): 1000 mg via ORAL
  Filled 2014-06-25 (×11): qty 2

## 2014-06-25 MED ORDER — INSULIN GLARGINE 100 UNIT/ML SC SOLN
86.0000 [IU] | Freq: Every morning | SUBCUTANEOUS | Status: DC
Start: 2014-06-26 — End: 2014-07-02
  Administered 2014-06-27 – 2014-06-28 (×2): 86 [IU] via SUBCUTANEOUS

## 2014-06-25 MED ORDER — INSULIN GLARGINE 100 UNIT/ML SC SOLN
86.0000 [IU] | SUBCUTANEOUS | Status: DC
Start: 2014-06-25 — End: 2014-06-26

## 2014-06-25 MED ORDER — DEXTROSE 50 % IV SOLN
25.0000 mL | INTRAVENOUS | Status: DC | PRN
Start: 2014-06-25 — End: 2014-07-08

## 2014-06-25 MED ORDER — ENOXAPARIN SODIUM 40 MG/0.4ML SC SOLN
40.0000 mg | SUBCUTANEOUS | Status: DC
Start: 2014-06-25 — End: 2014-07-08
  Administered 2014-06-25 – 2014-07-07 (×12): 40 mg via SUBCUTANEOUS
  Filled 2014-06-25 (×12): qty 0.4

## 2014-06-25 NOTE — Plan of Care (Signed)
AOx4, MAE, ambulates independently, steady gait, c/o HA, medicated with prn Motrin and Dilaudid x1. Continue iv abx and picc at IR Mon? Call bell within reach.

## 2014-06-25 NOTE — Progress Notes (Signed)
[facility] HOSPITALIST PROGRESS NOTE    Date Time: 06/25/2014 4:15 PM  Patient Name: [name] Brown  Attending Physician: Durenda Hurt, MD    Assessment:   1. Brain abscess   2. 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 Extensive history of recurrent sinusitis  4. DM  5. OSA  6. HTN  7. HLD  8. Chronic headache     Plan:    Continue with vancomycin, flagyl and cefepime. ID following. NSR following, planning to do surgery together with plastic surgery. Pt is asking about ENT consult, told him that I will have to talk to Dr. Jaynie Collins about it if that helps with the management    Continue with Lantus, will increase to his home dose of 86 BID, will also restart his home metformin at 1000 BID, continue with SSI   On home CPAP   Continue statin   Continue neurontin    Pain control     Case discussed with: pt and wife     Safety Checklist:     DVT prophylaxis:  CHEST guideline (See page e199S) Mechanical and chemical    Foley:  [place] Rn Foley protocol Not present   IVs:  Peripheral IV   PT/OT: Not needed   Daily CBC & or Chem ordered:  SHM/ABIM guidelines (see #5) No       Lines:     Active PICC Line / CVC Line / PIV Line / Drain / Airway / Intraosseous Line / Epidural Line / ART Line / Line / Wound / Pressure Ulcer / NG/OG Tube     Name:   Placement date:   Placement time:   Site:   Days:    Peripheral IV 06/23/14 Left Forearm  06/23/14      Forearm   1           Disposition:     Today's date: 06/25/2014  Length of Stay: 2  Anticipated medical stability for discharge: 1-2 days   Reason for ongoing hospitalization: brain abscess   Anticipated discharge needs: TBD    Subjective     CC: Brain abscess    Interval History/24 hour events/subjective: had severe headache this morning. Still having post nasal drip. No fevers. No weakness    HPI: [name] Brown is a 48 y.o. male w/ h/o DM type 2, chronic headache, OSA, HLD, sinusitis, HTN, brain abscess s/p craniotomy, iv Rocephin for MSSA who p/w recurrent brain abscess on recent MRI.  Pt reports bilateral retroorbital headaches weekly at around 2-3am which wake him up. Pt reports intermittent photophobia but denies visual changes or N/V. Pt denies F/C/D/SOB/CP/abd pain or any other sx's. Pt was admitted to NSICU on 09/30/13, d/c'ed to rehab on 10/07/14. Pt was d/c'ed home on 10/18/13.    Review of Systems:     See above    Physical Exam:     VITAL SIGNS PHYSICAL EXAM   Temp:  [95.8 F (35.4 C)-97.5 F (36.4 C)] 96.2 F (35.7 C)  Heart Rate:  [67-85] 75  Resp Rate:  [12-18] 18  BP: (93-155)/(51-88) 131/73 mmHg  Blood Glucose:    Telemetry: NSR      Intake/Output Summary (Last 24 hours) at 06/25/14 1615  Last data filed at 06/25/14 0700   Gross per 24 hour   Intake   1400 ml   Output      0 ml   Net   1400 ml    General: No apparent distress, comfortable appearing, vital signs  noted   Resp: Clear to ausculation, no wheezing, rhonci, or rales noted; normal respiratory effort   CV: Regular rate and rhythm, no murmurs, gallops, or rubs; no peripheral edema   GI: Abdomen obese, soft, non-tender, no masses; no hepatosplenomegaly   Skin: Normal temperature, tone, turgor; no rash, lesions, ulcers   Musc: No digital cyanosis or ischemia   Neuro: AOx3, CN 2-12 grossly intact, power 5/5 allover, sensation intact, DTR +1         Meds:     Medications were reviewed:    Labs:     Labs (last 72 hours):      Recent Labs  Lab 06/23/14  1324   WBC 10.53   HEMOGLOBIN 15.1   HEMATOCRIT 44.6   PLATELETS 266       No results for input(s): PT, INR, PTT in the last 168 hours.   Recent Labs  Lab 06/23/14  1324   SODIUM 136   POTASSIUM 4.7   CHLORIDE 105   CO2 22   BUN 12.0   CREATININE 0.9   CALCIUM 9.2   GLUCOSE 158*                   Microbiology, reviewed and are significant for:  Microbiology Results     Procedure Component Value Units Date/Time    CULTURE BLOOD AEROBIC AND ANAEROBIC [[id number]] Collected:  06/23/14 2124    Specimen Information:  Blood, Venipuncture Updated:  06/23/14 2300    Narrative:      1  BLUE+1 PURPLE    CULTURE BLOOD AEROBIC AND ANAEROBIC [[id number]] Collected:  06/23/14 2055    Specimen Information:  Blood, Venipuncture Updated:  06/23/14 2209    Narrative:      1 BLUE+1 PURPLE          Imaging, reviewed and are significant for:  Radiology Results (24 Hour)     ** No results found for the last 24 hours. **            Signed by: Durenda Hurt, MD

## 2014-06-25 NOTE — Progress Notes (Signed)
NEUROSURGERY PROGRESS NOTE    Date Time: 06/25/2014 12:46 PM  Patient Name: [name] Brown  Consulting Attending Physician: Dr. Ned Card  Covered By: Neurosurgery PA Deland Pretty [phone]    Interim History:   Pt c/o worsening headache this AM, relieved with pain medication.   No new neuro complaints.    Medications:     Current Facility-Administered Medications   Medication Dose Route Frequency   . cefepime  2 g Intravenous Q8H SCH   . gabapentin  300 mg Oral 6X Daily   . heparin (porcine)  5,000 Units Subcutaneous Q8H   . insulin glargine  10 Units Subcutaneous QHS   . insulin glargine  38 Units Subcutaneous QAM   . metroNIDAZOLE  500 mg Intravenous Q8H SCH   . simvastatin  40 mg Oral QHS   . vancomycin  1,500 mg Intravenous Q12H [facility]         Physical Exam:     Filed Vitals:    06/25/14 1157   BP: 131/73   Pulse: 75   Temp: 96.2 F (35.7 C)   Resp: 18   SpO2: 94%       Intake and Output Summary (Last 24 hours) at Date Time    Intake/Output Summary (Last 24 hours) at 06/25/14 1246  Last data filed at 06/25/14 0700   Gross per 24 hour   Intake   1400 ml   Output      0 ml   Net   1400 ml     Neuro exam:  AA&O x3  Sitting up in bed in NAD  Speech clear, fluent  FC promptly   PERRL, EOMI  RUE and RLE 5/5  LUE and LLE 5/5  SILT    Labs:     Lab Results   Component Value Date    WBC 10.53 06/23/2014    HGB 15.1 06/23/2014    HCT 44.6 06/23/2014    MCV 89.4 06/23/2014    PLT 266 06/23/2014     Lab Results   Component Value Date    NA 136 06/23/2014    K 4.7 06/23/2014    CL 105 06/23/2014    CO2 22 06/23/2014     Lab Results   Component Value Date    INR 1.0 09/30/2013    PT 12.9 09/30/2013       Rads:     Radiology Results (24 Hour)     ** No results found for the last 24 hours. **          Assessment:   48 y/o M, h/o brain abscess, chronic sinus infections presents with MRI findings of recurrent brain abscess    Plan:   - ID following  - Abx per ID  - Tentative plan for repeat craniotomy, resection of abscess for 9/4  with Drs. Lim and Hotel manager)     Signed by: Rolinda Roan PA-C  Date/Time: 06/25/2014 12:46 PM

## 2014-06-25 NOTE — Progress Notes (Signed)
06/25/2014 6:05 PM  Geurts,Danel      Updated problem List   Acute  Worsening headaches  New changes in MRI suggesting abscess in R cribriform plate/ R frontal lobe and medial R orbit/ medial rectus muscle  Prior MSSA brain abscess in 09/2013, treated with drainage and IV abx  -------------------------------------  Chronic conditions  Diabetes mellitus  Sleep apnea  Extensive history of recurrent sinusitis  -- multiple sinus surgeries, including at least two craniotomies    Assessment:   Tolerating antibiotics  Awaiting neurosurgery plan for  Upcoming surgery    Recommendations:   Continue antibiotics for now. May be able to narrow spectrum based on cultures of intra-op specimens.    Antibiotics:   #3 Vancomycin 1.5 gm IV bID  #3 Cefepime 2 gm IV q 8  #3 Metronidazole 500mg  IV q 8    IV lines:   piv    Family History:     Family History   Problem Relation Age of Onset   . Stroke Neg Hx        Social History:     History     Social History   . Marital Status: Married     Spouse Name: N/A     Number of Children: N/A   . Years of Education: N/A     Social History Main Topics   . Smoking status: Light Tobacco Smoker     Types: Cigars   . Smokeless tobacco: Never Used      Comment: Occasional Cigars   . Alcohol Use: 0.0 oz/week     4-5 Cans of beer per week   . Drug Use: No   . Sexual Activity: Not on file     Other Topics Concern   . Not on file     Social History Narrative       Allergies:     Allergies   Allergen Reactions   . Morphine    . Penicillins        Review of Systems:    no dyspnea, admits to some diarrhea, had headache this morning    Physical Exam:   BP 127/63 mmHg  Pulse 79  Temp(Src) 98.2 F (36.8 C) (Oral)  Resp 18  Ht 1.778 m (5\' 10" )  Wt 133.358 kg (294 lb)  BMI 42.18 kg/m2  SpO2 98%  Breathing well, no headache now, one stool today, abdomen soft nontender  Select labs:   Blood cultures negative x 2    Recent Labs  Lab 06/23/14  1324   WBC 10.53   HEMOGLOBIN 15.1   HEMATOCRIT 44.6   PLATELETS  266       Recent Labs  Lab 06/23/14  1324   SODIUM 136   POTASSIUM 4.7   CHLORIDE 105   CO2 22   BUN 12.0   CREATININE 0.9   CALCIUM 9.2   GLUCOSE 158*        Rads:   none    Attestations:     I certify that I personally reviewed the interim medical record, interviewed the patient, performed a physical examination, as documented above, reviewed the medications being actively administered, and reviewed the referenced laboratory and radiographic studies.  I have discussed my thoughts with the patient and with relevant medical team members.  I have given thought to the complex medical conditions present and have endeavored to balance the interventions required by the acute conditions with the potential toxicities of the medications and procedures on the patient's  well being and on the status of the other chronic conditions. I have considered the potential drug interactions between anti-microbial agents we have recommended and other medications required by the patient.   I have documented my recommendations for the primary attending, the medical trainees and the other medical and/or surgical consultants involved in caring for the patient.  A total of 25  minutes were spent in the care of this patient.    Signed by: Guadalupe Maple, MD

## 2014-06-25 NOTE — Plan of Care (Signed)
Problem: Pain  Goal: Patient's pain/discomfort is manageable  Outcome: Progressing  Pt is A & O X 4. Headache under control with Neurontin and Ibuprofen as needed. Vitals stable. On CPAP at night, tolerating well. SR noted on tele. Pt denies chest pain. Pt is ambulating independently. Wife at bedside. Call bell in reach. Will continue to monitor.

## 2014-06-26 ENCOUNTER — Encounter
Admission: EM | Disposition: A | Discharge status: Home Health | Payer: Self-pay | Source: Home / Self Care | Attending: Hospitalist

## 2014-06-26 LAB — BASIC METABOLIC PANEL
BUN: 13 mg/dL (ref 9.0–28.0)
CO2: 24 mEq/L (ref 22–29)
Calcium: 8.3 mg/dL — ABNORMAL LOW (ref 8.5–10.5)
Chloride: 108 mEq/L (ref 100–111)
Creatinine: 0.7 mg/dL (ref 0.7–1.3)
Glucose: 102 mg/dL — ABNORMAL HIGH (ref 70–100)
Potassium: 4.2 mEq/L (ref 3.5–5.1)
Sodium: 139 mEq/L (ref 136–145)

## 2014-06-26 LAB — GLUCOSE WHOLE BLOOD - POCT
Whole Blood Glucose POCT: 102 mg/dL — ABNORMAL HIGH (ref 70–100)
Whole Blood Glucose POCT: 104 mg/dL — ABNORMAL HIGH (ref 70–100)
Whole Blood Glucose POCT: 98 mg/dL (ref 70–100)
Whole Blood Glucose POCT: 173 mg/dL — ABNORMAL HIGH (ref 70–100)

## 2014-06-26 LAB — VANCOMYCIN, TROUGH: Vancomycin Trough: 10.6 ug/mL (ref 10.0–20.0)

## 2014-06-26 LAB — GFR: EGFR: >60

## 2014-06-26 SURGERY — PICC LINE PLACEMENT
Anesthesia: Local

## 2014-06-26 MED ORDER — INSULIN ASPART 100 UNIT/ML SC SOLN
1.0000 [IU] | Freq: Three times a day (TID) | SUBCUTANEOUS | Status: DC | PRN
Start: 2014-06-26 — End: 2014-07-08
  Administered 2014-06-26: 1 [IU] via SUBCUTANEOUS
  Administered 2014-06-27 – 2014-07-01 (×2): 2 [IU] via SUBCUTANEOUS
  Administered 2014-07-02 – 2014-07-05 (×5): 1 [IU] via SUBCUTANEOUS
  Filled 2014-06-26: qty 2
  Filled 2014-06-26 (×4): qty 1

## 2014-06-26 NOTE — Progress Notes (Signed)
Pt has an unclear consult order for iv access. Picc placement was unsuccessful on 8/28 by VAT and pt has an order for line placement in VIR today. Per bedside RN pt has a working 20g piv. Floor staff can place additional pivs if necessary until seen by VIR today.

## 2014-06-26 NOTE — Brief Op Note (Signed)
BRIEF OP NOTE    Date Time: 06/26/2014 3:19 PM    Patient Name:   [name] Brown    Date of Operation:   06/26/2014    Providers Performing:   Surgeon(s):  Denarius Sesler, Elmon Else, MD    Assistant (s):    Operative Procedure:   Procedure(s):  PICC LINE PLACEMENT    Preoperative Diagnosis:   Pre-Op Diagnosis Codes:     * Infection [136.9]    Postoperative Diagnosis:   * No post-op diagnosis entered *    Anesthesia:   (  ) FENTANYL  (  ) VERSED  (X  ) LOCAL  (  ) GENERAL ANESTHESIA (DEPT OF ANESTHESIOLOGY) )    Estimated Blood Loss:     NONE    CONTRAST   NONE      RADIATION DOSE   0.3MIN FLUORO TIME  3mG y      Findings:   SUCCESSFUL PLACEMENT OF 4 FRENCH SL RT BASILIC VEIN SL  WITH TIP IN SVC/RA JUNCTION    Complications:   NONE      Signed by: Otilio Miu, MD                                                                              [facility] CATH

## 2014-06-26 NOTE — Progress Notes (Signed)
ID PROGRESS NOTE    Date Time: 06/26/2014 8:46 AM  Patient Name: [name] Brown      Problem List:    Acute  Worsening headaches  New changes in MRI suggesting abscess in R cribriform plate/ R frontal lobe and medial R orbit/ medial rectus muscle  Prior MSSA brain abscess in 09/2013, treated with drainage and IV abx  -------------------------------------  Chronic conditions  Diabetes mellitus  Sleep apnea  Extensive history of recurrent sinusitis  -- multiple sinus surgeries, including at least two craniotomies    Assessment:   Tentative plan for repeat craniotomy, resection of abscess for 06/30/14 with Drs. Lim and Hotel manager)    The Patient is formally requesting ENT consultation as part of pre-operative evaluation given his history and current infection; defer to surgery for action on this issue    Antibiotics:   #4 Vancomycin 1.5 gm IV Q12H  #4 Cefepime 2 gm IV q 8H  #4 Metronidazole 500mg  IV q 8H    Plan:   Continue antibiotics for now    CBC, ESR, CRP, Chem7 ordered for tomorrow    May be able to narrow spectrum based on cultures of intra-op specimens    Lines:     Active PICC Line / CVC Line / PIV Line / Drain / Airway / Intraosseous Line / Epidural Line / ART Line / Line / Wound / Pressure Ulcer / NG/OG Tube     Name:   Placement date:   Placement time:   Site:   Days:    Peripheral IV 06/23/14 Left Forearm  06/23/14      Forearm   3          *I have performed a risk-benefit analysis and the patient needs a central line for access and IV medications    Family History:     Family History   Problem Relation Age of Onset   . Stroke Neg Hx        Social History:     History     Social History   . Marital Status: Married     Spouse Name: N/A     Number of Children: N/A   . Years of Education: N/A     Social History Main Topics   . Smoking status: Light Tobacco Smoker     Types: Cigars   . Smokeless tobacco: Never Used      Comment: Occasional Cigars   . Alcohol Use: 0.0 oz/week     4-5 Cans of beer per week    . Drug Use: No   . Sexual Activity: Not on file     Other Topics Concern   . Not on file     Social History Narrative       Allergies:     Allergies   Allergen Reactions   . Morphine    . Penicillins        Review of Systems:   General ROS: negative for - chills, fevers, night sweats, weight loss   HEENT: negative for - blurry vision, sore throat, thrush   Respiratory ROS: negative for cough, SOB  Cardiovascular ROS: negative for - chest pain, palpitations   Gastrointestinal ROS: negative for - abdominal pain, nausea, vomiting, diarrhea  Genito-Urinary ROS: negative for - dysuria, urinary frequency/urgency   Musculoskeletal ROS: negative for - joint pain, joint stiffness or muscle pain   Dermatological ROS: negative for - rash and skin lesion changes   Neurological ROS: negative for - confusion,  headache, dizziness  Hematological ROS: negative for - bruising, bleeding   Psychological ROS: anxious about recurrence of brain abscess    Physical Exam:     Filed Vitals:    06/26/14 0709   BP: 112/57   Pulse: 66   Temp: 96.8 F (36 C)   Resp: 16   SpO2: 94%       General Appearance: alert and appropriate, non-toxic  Neuro: alert, oriented, normal speech, normal attention and cognition  HEENT: no scleral icterus, pupils round and reactive, OP clear; some tenderness over R>L maxillary sinuses  Neck: supple  Lungs: clear to auscultation, no wheezes, rales or rhonchi, symmetric air entry   Cardiac: normal rate, regular rhythm, normal S1, S2,   Abdomen: soft, non-tender, non-distended, normal active bowel sounds  Extremities: no pedal edema  Skin: no rash  Psych: anxious about current infection    Labs:     Lab Results   Component Value Date    WBC 10.53 06/23/2014    HGB 15.1 06/23/2014    HCT 44.6 06/23/2014    MCV 89.4 06/23/2014    PLT 266 06/23/2014     Lab Results   Component Value Date    CREAT 0.7 06/26/2014     Lab Results   Component Value Date    ALT 52 10/17/2013    AST 39* 10/17/2013    ALKPHOS 74 10/17/2013     BILITOTAL 0.4 10/17/2013     No results found for: LACTATE    Microbiology:     Microbiology Results     Procedure Component Value Units Date/Time    CULTURE BLOOD AEROBIC AND ANAEROBIC [[id number]] Collected:  06/23/14 2124    Specimen Information:  Blood, Venipuncture Updated:  06/25/14 2321    Narrative:      ORDER#: [id number]                                    ORDERED BY: Candy Sledge  SOURCE: Blood, Venipuncture rue                      COLLECTED:  06/23/14 21:24  ANTIBIOTICS AT COLL.:                                RECEIVED :  06/23/14 23:00  Culture Blood Aerobic and Anaerobic        PRELIM      06/25/14 23:21  06/24/14   No Growth after 1 day/s of incubation.  06/25/14   No Growth after 2 day/s of incubation.      CULTURE BLOOD AEROBIC AND ANAEROBIC [[id number]] Collected:  06/23/14 2055    Specimen Information:  Blood, Venipuncture Updated:  06/25/14 2221    Narrative:      ORDER#: [id number]                                    ORDERED BY: Candy Sledge  SOURCE: Blood, Venipuncture lue                      COLLECTED:  06/23/14 20:55  ANTIBIOTICS AT COLL.:  RECEIVED :  06/23/14 22:09  Culture Blood Aerobic and Anaerobic        PRELIM      06/25/14 22:21  06/24/14   No Growth after 1 day/s of incubation.  06/25/14   No Growth after 2 day/s of incubation.            Rads:   No results found.    Signed by: Tonita Phoenix., MD

## 2014-06-26 NOTE — Plan of Care (Signed)
Problem: Safety  Goal: Patient will be free from injury during hospitalization  Outcome: Progressing  Pt ambulates independently. Independent of ADLs.     Problem: Pain  Goal: Patient's pain/discomfort is manageable  Outcome: Progressing  Patient has intermittent headache, controlled with rest, ibuprofen, gabapentin.     Comments:   Pt experienced nosebleed x1 this shift. Reports this happens occasionally and did not call for help to stop or assess bleeding. MD aware. PICC line placed in RUE. IV abx given. Vanc trough complete, results noted. Vanc dose administered. Pt SR on telemetry. Ambulated off unit with nurse knowledge. Tolerated activity.

## 2014-06-26 NOTE — Progress Notes (Signed)
CNS HOSPITALIST PROGRESS NOTE    Date Time: 06/26/2014 3:20 PM  Patient Name: [name] Brown  Attending Physician: Durenda Hurt, MD    Assessment:   1. Brain abscess   2. 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 Extensive history of recurrent sinusitis  4. DM  5. OSA  6. HTN  7. HLD  8. Chronic headache     Plan:    Continue with vancomycin, flagyl and cefepime. ID following. NSR following, planning to do surgery together with plastic surgery on Friday 9/4. Pt is asking about ENT consult, told him that I will have to talk to Dr. Jaynie Collins about it if that helps with the management    His sugar seems to be controlled with metformin and he did get the lantus last night and this am, will [place] Lantus follow with Metformin and SSI. If his sugar starts to go up he should be started with a quarter dose of his home Lantus    On home CPAP   Continue statin   Continue neurontin    Pain control     Case discussed with: pt and ID    Safety Checklist:     DVT prophylaxis:  CHEST guideline (See page e199S) Mechanical and chemical    Foley:  [place] Rn Foley protocol Not present   IVs:  Peripheral IV   PT/OT: Not needed   Daily CBC & or Chem ordered:  SHM/ABIM guidelines (see #5) No       Lines:     Active PICC Line / CVC Line / PIV Line / Drain / Airway / Intraosseous Line / Epidural Line / ART Line / Line / Wound / Pressure Ulcer / NG/OG Tube     Name:   Placement date:   Placement time:   Site:   Days:    Peripheral IV 06/23/14 Left Forearm  06/23/14      Forearm   1           Disposition:     Today's date: 06/26/2014  Length of Stay: 3  Anticipated medical stability for discharge: 1-2 days   Reason for ongoing hospitalization: brain abscess   Anticipated discharge needs: TBD    Subjective     CC: Brain abscess    Interval History/24 hour events/subjective: mild bleeding from his nose, no headache. No numbness and tingling     HPI: [name] Brown is a 48 y.o. male w/ h/o DM type 2, chronic headache, OSA, HLD, sinusitis, HTN,  brain abscess s/p craniotomy, iv Rocephin for MSSA who p/w recurrent brain abscess on recent MRI. Pt reports bilateral retroorbital headaches weekly at around 2-3am which wake him up. Pt reports intermittent photophobia but denies visual changes or N/V. Pt denies F/C/D/SOB/CP/abd pain or any other sx's. Pt was admitted to NSICU on 09/30/13, d/c'ed to rehab on 10/07/14. Pt was d/c'ed home on 10/18/13.    Review of Systems:     See above    Physical Exam:     VITAL SIGNS PHYSICAL EXAM   Temp:  [96.5 F (35.8 C)-98.2 F (36.8 C)] 97.6 F (36.4 C)  Heart Rate:  [66-97] 78  Resp Rate:  [12-19] 19  BP: (104-199)/(55-94) 172/82 mmHg  Blood Glucose:    Telemetry: NSR      Intake/Output Summary (Last 24 hours) at 06/26/14 1520  Last data filed at 06/26/14 0852   Gross per 24 hour   Intake    200 ml   Output  0 ml   Net    200 ml    General: No apparent distress, comfortable appearing, vital signs noted   Resp: Clear to ausculation, no wheezing, rhonci, or rales noted; normal respiratory effort   CV: Regular rate and rhythm, no murmurs, gallops, or rubs; no peripheral edema   GI: Abdomen obese, soft, non-tender, no masses; no hepatosplenomegaly   Skin: Normal temperature, tone, turgor; no rash, lesions, ulcers   Musc: No digital cyanosis or ischemia   Neuro: AOx3, CN 2-12 grossly intact, power 5/5 allover, sensation intact, DTR +1         Meds:     Medications were reviewed:    Labs:     Labs (last 72 hours):      Recent Labs  Lab 06/23/14  1324   WBC 10.53   HEMOGLOBIN 15.1   HEMATOCRIT 44.6   PLATELETS 266       No results for input(s): PT, INR, PTT in the last 168 hours.   Recent Labs  Lab 06/26/14  0417 06/23/14  1324   SODIUM 139 136   POTASSIUM 4.2 4.7   CHLORIDE 108 105   CO2 24 22   BUN 13.0 12.0   CREATININE 0.7 0.9   CALCIUM 8.3* 9.2   GLUCOSE 102* 158*                   Microbiology, reviewed and are significant for:  Microbiology Results     Procedure Component Value Units Date/Time    CULTURE BLOOD AEROBIC  AND ANAEROBIC [[id number]] Collected:  06/23/14 2124    Specimen Information:  Blood, Venipuncture Updated:  06/23/14 2300    Narrative:      1 BLUE+1 PURPLE    CULTURE BLOOD AEROBIC AND ANAEROBIC [[id number]] Collected:  06/23/14 2055    Specimen Information:  Blood, Venipuncture Updated:  06/23/14 2209    Narrative:      1 BLUE+1 PURPLE          Imaging, reviewed and are significant for:  Radiology Results (24 Hour)     Procedure Component Value Units Date/Time    PICC Line Placement [[id number]] Resulted:  06/26/14 1404    Order Status:  Sent Updated:  06/26/14 1404            Signed by: Durenda Hurt, MD

## 2014-06-26 NOTE — Plan of Care (Signed)
Lantus held due to BG level, VSS, A&Ox4, MAEX4, Ambulated to BR and around the unit independently, Photophobia continues, Prn Motrin for c/o HA, PO tolerated, O2 RA, CPAP, IV ABT administered with no adverse effects, Will continue to monitor for needs and safety, Call light in reach. Plan:  PICC in IR?

## 2014-06-26 NOTE — Sedation Documentation (Signed)
Pt placed on table and al monitoring applied. IV access does not have  Blood return, does flush but is red around the area.

## 2014-06-26 NOTE — Progress Notes (Signed)
ARRIVED TO [facility] ROOM 14, VERIFIED IDENTITY.IV FLUSHES TO LEFT FOREARM ,BUT WTIHOUT BLOOD RETURN, QUESTION PATENCY

## 2014-06-26 NOTE — Sedation Documentation (Signed)
Picc line inserted right arm

## 2014-06-27 ENCOUNTER — Inpatient Hospital Stay: Payer: BLUE CROSS/BLUE SHIELD

## 2014-06-27 LAB — CBC AND DIFFERENTIAL
Basophils Absolute Automated: 0.01 10*3/uL (ref 0.00–0.20)
Basophils Automated: 0 %
Eosinophils Absolute Automated: 0.22 10*3/uL (ref 0.00–0.70)
Eosinophils Automated: 3 %
Hematocrit: 40.5 % — ABNORMAL LOW (ref 42.0–52.0)
Hgb: 13.3 g/dL (ref 13.0–17.0)
Immature Granulocytes Absolute: 0.02 10*3/uL
Immature Granulocytes: 0 %
Lymphocytes Absolute Automated: 1.63 10*3/uL (ref 0.50–4.40)
Lymphocytes Automated: 23 %
MCH: 29.8 pg (ref 28.0–32.0)
MCHC: 32.8 g/dL (ref 32.0–36.0)
MCV: 90.8 fL (ref 80.0–100.0)
MPV: 10 fL (ref 9.4–12.3)
Monocytes Absolute Automated: 0.58 10*3/uL (ref 0.00–1.20)
Monocytes: 8 %
Neutrophils Absolute: 4.74 10*3/uL (ref 1.80–8.10)
Neutrophils: 66 %
Nucleated RBC: 0 /100 WBC (ref 0–1)
Platelets: 225 10*3/uL (ref 140–400)
RBC: 4.46 10*6/uL — ABNORMAL LOW (ref 4.70–6.00)
RDW: 13 % (ref 12–15)
WBC: 7.2 10*3/uL (ref 3.50–10.80)

## 2014-06-27 LAB — GLUCOSE WHOLE BLOOD - POCT
Whole Blood Glucose POCT: 116 mg/dL — ABNORMAL HIGH (ref 70–100)
Whole Blood Glucose POCT: 210 mg/dL — ABNORMAL HIGH (ref 70–100)
Whole Blood Glucose POCT: 134 mg/dL — ABNORMAL HIGH (ref 70–100)
Whole Blood Glucose POCT: 138 mg/dL — ABNORMAL HIGH (ref 70–100)

## 2014-06-27 LAB — BASIC METABOLIC PANEL
BUN: 12 mg/dL (ref 9.0–28.0)
CO2: 24 mEq/L (ref 22–29)
Calcium: 8.1 mg/dL — ABNORMAL LOW (ref 8.5–10.5)
Chloride: 108 mEq/L (ref 100–111)
Creatinine: 0.8 mg/dL (ref 0.7–1.3)
Glucose: 119 mg/dL — ABNORMAL HIGH (ref 70–100)
Potassium: 4.1 mEq/L (ref 3.5–5.1)
Sodium: 141 mEq/L (ref 136–145)

## 2014-06-27 LAB — C-REACTIVE PROTEIN: C-Reactive Protein: 2.9 mg/dL — ABNORMAL HIGH (ref 0.0–0.8)

## 2014-06-27 LAB — SEDIMENTATION RATE: Sed Rate: 24 mm/Hr — ABNORMAL HIGH (ref 0–10)

## 2014-06-27 LAB — GFR: EGFR: >60

## 2014-06-27 MED ORDER — VANCOMYCIN HCL 1000 MG IV SOLR
1750.0000 mg | Freq: Two times a day (BID) | INTRAVENOUS | Status: DC
Start: 2014-06-27 — End: 2014-06-29
  Administered 2014-06-27 – 2014-06-29 (×4): 1750 mg via INTRAVENOUS
  Filled 2014-06-27 (×4): qty 1750

## 2014-06-27 NOTE — Progress Notes (Incomplete)
Pt remains on Flagyl, Maxipime and Vanco therapy. No adverse reactions observed or reported. Pt verbalizes pain at a tolerable level with current pain management. Pt and wife requested to off unit to walk. Dr. Clydene Fake informed and order noted. Pt and wife returned to unit safely no incident occurred. Will continue to follow plan of care.

## 2014-06-27 NOTE — Progress Notes (Signed)
ID PROGRESS NOTE    Date Time: 06/27/2014 5:49 PM  Patient Name: [name] Brown      Problem List:    Acute  Worsening headaches  New changes in MRI suggesting abscess in R cribriform plate/ R frontal lobe and medial R orbit/ medial rectus muscle  Prior MSSA brain abscess in 09/2013, treated with drainage and IV abx  -------------------------------------  Chronic conditions  Diabetes mellitus  Sleep apnea  Extensive history of recurrent sinusitis  -- multiple sinus surgeries, including at least two craniotomies    Assessment:   On empiric treatment for a suspected brain abscess.  Tentative plan is for repeat craniotomy, resection of abscess, and plastic surgery for flap coverage.    Antibiotics:   #5 Vancomycin 1.5 gm IV Q12H, 8/31 trough 10.6 ---> Increase to 1.75 gm IV q12 hours  #5 Cefepime 2 gm IV q 8H  #5 Metronidazole 500mg  IV q 8H    Plan:   1.  Increase vancomycin to 1.75 gm IV q12 hours.  2.  Continue cefepime and metronidazole.  3.  OR date to be determined.    Discussed with the patient and his wife.    Lines:     Active PICC Line / CVC Line / PIV Line / Drain / Airway / Intraosseous Line / Epidural Line / ART Line / Line / Wound / Pressure Ulcer / NG/OG Tube     Name:   Placement date:   Placement time:   Site:   Days:    PICC Single Lumen 06/26/14 Right Basilic  06/26/14   1517   Basilic   1          *I have performed a risk-benefit analysis and the patient needs a central line for access and IV medications    Family History:     Family History   Problem Relation Age of Onset   . Stroke Neg Hx        Social History:     History     Social History   . Marital Status: Married     Spouse Name: N/A     Number of Children: N/A   . Years of Education: N/A     Social History Main Topics   . Smoking status: Light Tobacco Smoker     Types: Cigars   . Smokeless tobacco: Never Used      Comment: Occasional Cigars   . Alcohol Use: 0.0 oz/week     4-5 Cans of beer per week   . Drug Use: No   . Sexual Activity: Not on  file     Other Topics Concern   . Not on file     Social History Narrative       Allergies:     Allergies   Allergen Reactions   . Morphine    . Penicillins        Review of Systems:   Afebrile  Some headaches  Diarrhea has improved compared to admission.    Physical Exam:     Filed Vitals:    06/27/14 1551   BP: 162/87   Pulse: 90   Temp: 96.9 F (36.1 C)   Resp: 16   SpO2: 92%       General Appearance:  non-toxic  Neuro: awake, alert, wearing sun glasses   Lungs: clear to auscultation, no wheezes, rales or rhonchi   Cardiac: normal rate, regular rhythm     Labs:     Lab Results  Component Value Date    WBC 7.20 06/27/2014    HGB 13.3 06/27/2014    HCT 40.5* 06/27/2014    MCV 90.8 06/27/2014    PLT 225 06/27/2014     Lab Results   Component Value Date    CREAT 0.8 06/27/2014     Lab Results   Component Value Date    ALT 52 10/17/2013    AST 39* 10/17/2013    ALKPHOS 74 10/17/2013    BILITOTAL 0.4 10/17/2013     No results found for: LACTATE    Microbiology:     Microbiology Results     Procedure Component Value Units Date/Time    CULTURE BLOOD AEROBIC AND ANAEROBIC [[id number]] Collected:  06/23/14 2124    Specimen Information:  Blood, Venipuncture Updated:  06/26/14 2321    Narrative:      ORDER#: [id number]                                    ORDERED BY: Candy Sledge  SOURCE: Blood, Venipuncture rue                      COLLECTED:  06/23/14 21:24  ANTIBIOTICS AT COLL.:                                RECEIVED :  06/23/14 23:00  Culture Blood Aerobic and Anaerobic        PRELIM      06/26/14 23:21  06/24/14   No Growth after 1 day/s of incubation.  06/25/14   No Growth after 2 day/s of incubation.  06/26/14   No Growth after 3 day/s of incubation.      CULTURE BLOOD AEROBIC AND ANAEROBIC [[id number]] Collected:  06/23/14 2055    Specimen Information:  Blood, Venipuncture Updated:  06/26/14 2221    Narrative:      ORDER#: [id number]                                    ORDERED BY: Candy Sledge  SOURCE: Blood,  Venipuncture lue                      COLLECTED:  06/23/14 20:55  ANTIBIOTICS AT COLL.:                                RECEIVED :  06/23/14 22:09  Culture Blood Aerobic and Anaerobic        PRELIM      06/26/14 22:21  06/24/14   No Growth after 1 day/s of incubation.  06/25/14   No Growth after 2 day/s of incubation.  06/26/14   No Growth after 3 day/s of incubation.            Rads:   Ct Maxillofacial Bones    06/27/2014    Prior sinonasal surgery. Sinonasal disease. Extensive bony dehiscence medial wall right orbit. Extensive bony dehiscence right cribriform plate. Small area of bony dehiscence superomedial aspect of right orbit.  Melody Haver, MD  06/27/2014 5:10 PM       Signed by: Marga Hoots, MD

## 2014-06-27 NOTE — Consults (Signed)
SURGICAL CONSULTATION  Plastic Surgery    Date Time: 06/27/2014 7:31 AM  Patient Name: [name] Brown  Attending Physician: Durenda Hurt, MD  Consulting Physician:  [name], MD  Consulting Service: Medicine/Neurosurgery  Reason for consultation: Flap coverage    History of Present Illness:   Bernarr Longsworth is a 48 y.o. male hx DM, HTN, brain abscess s/p craniotomy 09/2013 currently admitted with recurrence of the abscess on 8/28. Has noted frequent headaches and photophobia but denies any changes in visual acuity, fevers, chills, cp, sob, n/v, dizziness, focal weakness, numbness/tingling. Plastics is consulted for assistance with flap coverage following repeat resection planned for 9/4    Past Medical History:     Past Medical History   Diagnosis Date   . Seasonal allergic rhinitis    . Diabetes mellitus    . Sinusitis    . Migraines    . HLD (hyperlipidemia)    . Brain abscess    . Sleep apnea 2000     c-pap   . Headache 1999     chronic   . HTN (hypertension)        Past Surgical History:     Past Surgical History   Procedure Laterality Date   . Craniotomy, removal hematoma, subdural  09/30/2013     Procedure: CRANIOTOMY, REMOVAL HEMATOMA, SUBDURAL;  Surgeon: Madaline Savage, MD;  Location:  [facility];  Service: Neurosurgery;  Laterality: N/A;   BI-FRONTAL CRANIOTOMY, FOR  EVACUATION OF RIGHT FRONTAL ABSCESS       Family History:     Family History   Problem Relation Age of Onset   . Stroke Neg Hx        Social History:     History     Social History   . Marital Status: Married     Spouse Name: N/A     Number of Children: N/A   . Years of Education: N/A     Social History Main Topics   . Smoking status: Light Tobacco Smoker     Types: Cigars   . Smokeless tobacco: Never Used      Comment: Occasional Cigars   . Alcohol Use: 0.0 oz/week     4-5 Cans of beer per week   . Drug Use: No   . Sexual Activity: Not on file     Other Topics Concern   . Not on file     Social History Narrative       Allergies:      Allergies   Allergen Reactions   . Morphine    . Penicillins        Medications:     Prior to Admission medications    Medication Sig Start Date End Date Taking? Authorizing Provider   cefTRIAXone (ROCEPHIN) 2 G injection Inject 2,000 mg (2 g total) into the vein 2 (two) times daily. 10/07/13   Wing, Rowland Lathe, MD   famotidine (PEPCID) 20 MG tablet Take 1 tablet (20 mg total) by mouth every 12 (twelve) hours. 10/07/13   Wing, Rowland Lathe, MD   gabapentin (NEURONTIN) 300 MG capsule Take 1 capsule (300 mg total) by mouth every 8 (eight) hours.  Patient taking differently: Take 300 mg by mouth 6 (six) times daily. 5-6 times daily   10/17/13   Everlean Alstrom, MD   insulin aspart (NOVOLOG) 100 UNIT/ML injection Inject 1-12 Units into the skin as needed for High Blood Sugar. 10/07/13   Ignacia Felling, MD   insulin  glargine (LANTUS) 100 UNIT/ML injection Inject 38 Units into the skin every morning. 10/17/13   Everlean Alstrom, MD   metFORMIN (GLUCOPHAGE) 1000 MG tablet Take 1 tablet (1,000 mg total) by mouth 2 (two) times daily with meals. 10/17/13   Everlean Alstrom, MD   oxyCODONE (OXYCONTIN) 10 MG 12 hr tablet Take 1 tablet (10 mg total) by mouth every 8 (eight) hours.  Patient taking differently: Take 10 mg by mouth every 12 (twelve) hours as needed.    10/17/13   Everlean Alstrom, MD   oxyCODONE (ROXICODONE) 15 MG immediate release tablet Take 1 tablet (15 mg total) by mouth every 3 (three) hours as needed. 10/17/13   Everlean Alstrom, MD   saline (OCEAN NASAL SPRAY) 0.65 % Solution 2 sprays by Each Nare route as needed. 10/07/13   Wing, Rowland Lathe, MD   senna-docusate (PERICOLACE) 8.6-50 MG per tablet Take 2 tablets by mouth nightly. 10/07/13   Wing, Rowland Lathe, MD   simvastatin (ZOCOR) 40 MG tablet Take 1 tablet (40 mg total) by mouth daily with dinner. 10/17/13   Everlean Alstrom, MD       Review of Systems:   Negative otherwise as noted in HPI    Physical Exam:     Filed  Vitals:    06/27/14 0727   BP: 135/78   Pulse: 68   Temp: 96 F (35.6 C)   Resp: 16   SpO2: 95%       Intake and Output Summary (Last 24 hours) at Date Time    Intake/Output Summary (Last 24 hours) at 06/27/14 0731  Last data filed at 06/26/14 1158   Gross per 24 hour   Intake    400 ml   Output      0 ml   Net    400 ml     PHYSICAL EXAM:  General:AAOx3, NAD  HEENT:   CV: RRR, no murmurs, rubs, gallops  Pulm: CTAB, no wheezes, rhonchi  Abdomen: Soft, obese, nontender, no palpable masses  Extremities: peripheral pulses intact, nontender, no edema  Neuro:  SILT throughout, CN 2-12 intact, gross motor intact    Labs:       Hematology   Recent Labs      06/27/14   0357   WBC  7.20   HEMOGLOBIN  13.3   HEMATOCRIT  40.5*   PLATELETS  225      Chemistry   Recent Labs      06/27/14   0357  06/26/14   0417   SODIUM  141  139   POTASSIUM  4.1  4.2   CHLORIDE  108  108   CO2  24  24   BUN  12.0  13.0   CREATININE  0.8  0.7   GLUCOSE  119*  102*   CALCIUM  8.1*  8.3*      Coagulation   No results for input(s): PT, INR, PTT in the last 72 hours.   Liver Function Tests   No results for input(s): AST, ALT, ALKPHOS, PROT, ALB, BILITOTAL, BILIDIRECT, LIP, AMY in the last 72 hours.   ABG   No results for input(s): PHART, PCO2ART, PO2ART, HCO3ART, BEART, O2SATART, LA, FIO2, MODE, PEEP, PRESSURESUPP in the last 72 hours.           Rads:   8/25 MRI Brain:   IMPRESSION:   1. Contraction of the previously visualized right frontal lobe abscess cavity as above.  2. Abnormal  enhancement along the right cribriform plate that extends into the adjacent right frontal gyrus rectus and orbital gyrus with superimposed abscess formation as above.  3. Enhancing soft tissue extending through the right lamina papyracea into the medial right orbit consistent with subperiosteal abscess/phlegmon with suspected involvement of the right medial rectus muscle as above.     Problem List:     Patient Active Problem List   Diagnosis   . Brain abscess   . Type  2 diabetes mellitus   . Chronic headache   . OSA (obstructive sleep apnea)   . HLD (hyperlipidemia)   . Sinusitis   . HTN (hypertension)       Assessment:   48 y.o. male hx HTN, DM, brain abscess s/p craniotomy 09/2013 now with concern for abscess recurrence    Plan:   Continue abx per ID  Medical management per primary  Pt requesting additional ENT consult   Will touch base with neurosurgery re flap coverage as pt may require a free flap and additional logistic planning  Will continue to follow      M. Everlena Cooper, MD  General Surgery PGY-2  Pager: [phone]    Delayed entry- Pt was seen with Dr. Lucile Shutters on am rounds yesterday.  I also had multiple d/w Dr. Jaynie Collins and Dr. Dorinda Hill about the patient, in total about two hours spent at bedside and on the phone re the pt.    I had a lond d/w pt re all of his reconstr option. Local flap (pros- shorter surgery initially, cons- large bald spot), and free flap (pros- usu one stage, no signif bald spot, cons- longer initial surgery, flap donor site issues, microsurgery).      He would very much rather have the free flap in light of one stage.  First, though, he would like to have ENT second opinion.      I will d/w the other team members to plan for surgery with Dr. Jaynie Collins.    [name], MD [facility]  Plastic Surgery, [phone]

## 2014-06-27 NOTE — Progress Notes (Signed)
NEUROSURGERY PROGRESS NOTE    Date Time: 06/27/2014 5:16 PM  Patient Name: [name] Brown  Consulting Attending Physician: Dr. Vivia Budge  Covered By: Neurosurgery PA Deland Pretty [phone]    Interim History:   Pt c/o mild headache and post nasal drip.    Medications:     Current Facility-Administered Medications   Medication Dose Route Frequency   . cefepime  2 g Intravenous Q8H SCH   . enoxaparin  40 mg Subcutaneous Q24H   . gabapentin  300 mg Oral 6X Daily   . insulin glargine  86 Units Subcutaneous QAM   . metFORMIN  1,000 mg Oral BID Meals   . metroNIDAZOLE  500 mg Intravenous Q8H SCH   . simvastatin  40 mg Oral QHS   . vancomycin  1,500 mg Intravenous Q12H [facility]         Physical Exam:     Filed Vitals:    06/27/14 1551   BP: 162/87   Pulse: 90   Temp: 96.9 F (36.1 C)   Resp: 16   SpO2: 92%       Intake and Output Summary (Last 24 hours) at Date Time  No intake or output data in the 24 hours ending 06/27/14 1716    Neuro exam:  AA&O x3  Sitting up in bed in NAD  Speech clear, fluent  FC promptly   PERRL, EOMI  RUE and RLE 5/5  LUE and LLE 5/5    Labs:     Lab Results   Component Value Date    WBC 7.20 06/27/2014    HGB 13.3 06/27/2014    HCT 40.5* 06/27/2014    MCV 90.8 06/27/2014    PLT 225 06/27/2014     Lab Results   Component Value Date    NA 141 06/27/2014    K 4.1 06/27/2014    CL 108 06/27/2014    CO2 24 06/27/2014     Lab Results   Component Value Date    INR 1.0 09/30/2013    PT 12.9 09/30/2013       Rads:     Radiology Results (24 Hour)     Procedure Component Value Units Date/Time    CT Maxillofacial Bones [[id number]] Collected:  06/27/14 1700    Order Status:  Completed Updated:  06/27/14 1714    Narrative:      Clinical history: Inferior right frontal lobe abscess. Evaluate for bony  dehiscence. Paranasal sinus disease.    Comments: Unenhanced CT of the paranasal sinuses was performed with  landmarks protocol.    There is evidence of prior right frontal craniotomy and anterior  craniotomy in the midline.  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 The middle and  superior turbinates are absent. A small portion of the left inferior  turbinate remains. The right inferior turbinate remains. The left  ethmoid air cells are obliterated and completely ossified. The left  maxillary sinus is obliterated and completely ossified. The right  maxillary sinus is small with chronic bony thickening and chronic  membrane thickening. This sphenoid sinuses are completely opacified with  membrane thickening and are small in size due to adjacent bony  thickening. The anterior right ethmoid air cells are absent with soft  tissue thickening in this area. The frontal sinuses have been previously  obliterated with small residual.    The medial wall of the right orbit disc broadly dehiscent. The area of  dehiscence  measures 21 mm craniocaudal and 28 mm anteroposterior. There  is nonspecific soft tissue thickening in this area of dehiscence. Soft  tissue thickening extends up to the adjacent medial rectus muscle. There  is also extensive bony dehiscence of the right cribriform plate. This  area of dehiscence measures 4 mm transverse and 15 mm anteroposterior. A  small intact bony septum extends through a portion of this area of  dehiscence. There is a smaller area of bony dehiscence involving the  superomedial wall of the right orbit posteriorly measuring 2 mm x 2.6  mm.      Impression:       Prior sinonasal surgery. Sinonasal disease. Extensive bony  dehiscence medial wall right orbit. Extensive bony dehiscence right  cribriform plate. Small area of bony dehiscence superomedial aspect of  right orbit.    Melody Haver, MD   06/27/2014 5:10 PM            Assessment:   48 y/o M, h/o brain abscess, chronic sinus infections presents with MRI findings of recurrent brain abscess    Plan:   - Abx per ID  - Surgery planning is ongoing, OR date TBD.     Signed by: Rolinda Roan  PA-C  Date/Time: 06/27/2014 5:16 PM

## 2014-06-27 NOTE — Progress Notes (Signed)
HD 4    Continues to complain of some headache      Neuro exam:  AA&O x3  Sitting up in bed in NAD  Speech clear, fluent  FC promptly   PERRL, EOMI  RUE and RLE 5/5  LUE and LLE 5/5      A/P  Neuro stable  Continue IV antibiotics  Will request Dr. Nicolette Bang from Plastics to see regarding flap coverage of anterior fossa.        Rella Larve, MD

## 2014-06-27 NOTE — Progress Notes (Signed)
[facility] HOSPITALIST PROGRESS NOTE    Date Time: 06/27/2014 7:57 AM  Patient Name: [name]  Attending Physician: Balinda Quails Man, MD    Assessment:   1. Brain abscess   2. 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 Extensive history of recurrent sinusitis  4. DM  5. OSA  6. HTN  7. HLD  8. Chronic headache     Plan:    Continue with vancomycin, flagyl and cefepime. ID following. NSR following, planning to do surgery together with plastic surgery on Friday 9/4. Pt is asking about ENT consult, told him that this should be addressed with Dr. Jaynie Collins about it if that helps with the management    His sugar seems to be well conntrolled with metformin and Lantus this time.     On home CPAP   Continue statin   Continue neurontin    Pain control     Case discussed with: pt and ID    Safety Checklist:     DVT prophylaxis:  CHEST guideline (See page [id number]) Mechanical and chemical    Foley:  [place] Rn Foley protocol Not present   IVs:  Peripheral IV   PT/OT: Not needed   Daily CBC & or Chem ordered:  SHM/ABIM guidelines (see #5) No       Lines:     Active PICC Line / CVC Line / PIV Line / Drain / Airway / Intraosseous Line / Epidural Line / ART Line / Line / Wound / Pressure Ulcer / NG/OG Tube     Name:   Placement date:   Placement time:   Site:   Days:    Peripheral IV 06/23/14 Left Forearm  06/23/14      Forearm   1           Disposition:     Today's date: 06/27/2014  Length of Stay: 4  Anticipated medical stability for discharge: 1-2 days   Reason for ongoing hospitalization: brain abscess   Anticipated discharge needs: TBD    Subjective     CC: Brain abscess    Interval History/24 hour events/subjective: No bleeding from his nose, no headache. No numbness and tingling. No any acute events happened.     HPI: [name] Brown is a 48 y.o. male w/ h/o DM type 2, chronic headache, OSA, HLD, sinusitis, HTN, brain abscess s/p craniotomy, iv Rocephin for MSSA who p/w recurrent brain abscess on recent MRI. Pt reports bilateral  retroorbital headaches weekly at around 2-3am which wake him up. Pt reports intermittent photophobia but denies visual changes or N/V. Pt denies F/C/D/SOB/CP/abd pain or any other sx's. Pt was admitted to NSICU on 09/30/13, d/c'ed to rehab on 10/07/14. Pt was d/c'ed home on 10/18/13.    Review of Systems:     See above    Physical Exam:     VITAL SIGNS PHYSICAL EXAM   Temp:  [96 F (35.6 C)-97.6 F (36.4 C)] 96 F (35.6 C)  Heart Rate:  [57-97] 68  Resp Rate:  [14-19] 16  BP: (120-199)/(74-94) 135/78 mmHg  Blood Glucose:    Telemetry: NSR      Intake/Output Summary (Last 24 hours) at 06/27/14 0757  Last data filed at 06/26/14 1158   Gross per 24 hour   Intake    400 ml   Output      0 ml   Net    400 ml    General: No apparent distress, comfortable appearing, vital signs noted  Resp: Clear to ausculation, no wheezing, rhonci, or rales noted; normal respiratory effort   CV: Regular rate and rhythm, no murmurs, gallops, or rubs; no peripheral edema   GI: Abdomen obese, soft, non-tender, no masses; no hepatosplenomegaly   Skin: Normal temperature, tone, turgor; no rash, lesions, ulcers   Musc: No digital cyanosis or ischemia   Neuro: AOx3, CN 2-12 grossly intact, power 5/5 allover, sensation intact, DTR +1         Meds:     Medications were reviewed:    Labs:     Labs (last 72 hours):      Recent Labs  Lab 06/27/14  0357 06/23/14  1324   WBC 7.20 10.53   HEMOGLOBIN 13.3 15.1   HEMATOCRIT 40.5* 44.6   PLATELETS 225 266       No results for input(s): PT, INR, PTT in the last 168 hours.   Recent Labs  Lab 06/27/14  0357 06/26/14  0417   SODIUM 141 139   POTASSIUM 4.1 4.2   CHLORIDE 108 108   CO2 24 24   BUN 12.0 13.0   CREATININE 0.8 0.7   CALCIUM 8.1* 8.3*   GLUCOSE 119* 102*                   Microbiology, reviewed and are significant for:  Microbiology Results     Procedure Component Value Units Date/Time    CULTURE BLOOD AEROBIC AND ANAEROBIC [[id number]] Collected:  06/23/14 2124    Specimen Information:  Blood,  Venipuncture Updated:  06/23/14 2300    Narrative:      1 BLUE+1 PURPLE    CULTURE BLOOD AEROBIC AND ANAEROBIC [[id number]] Collected:  06/23/14 2055    Specimen Information:  Blood, Venipuncture Updated:  06/23/14 2209    Narrative:      1 BLUE+1 PURPLE          Imaging, reviewed and are significant for:  Radiology Results (24 Hour)     Procedure Component Value Units Date/Time    PICC Line Placement [[id number]] Collected:  06/26/14 1527    Order Status:  Completed Updated:  06/26/14 1532    Narrative:      PROCEDURE:  PICC line insertion.    INDICATIONS:  48 year old male requires PICC catheter for intravenous  medications. An attempt at placement by the PICC team was unsuccessful..    CONSENT:  The procedure, risks, benefits and alternatives were discussed  with the patient.  All questions were answered, and informed written  consent was obtained from patient.    TECHNIQUE:  Ultrasound was used to confirm patency of the right basilic  vein prior to obtaining venous access.   The area to be punctured was  cleansed with chlorhexidine and the site draped using maximal sterile  barrier precautions.  A large sterile drape/broad field sheet was  applied. Standard hand washing was performed by the operators.  Sterile  gowns and gloves were worn as were caps and masks.  After 1% lidocaine  anesthesia, puncture of the right basilic vein was performed with a  21-gauge single-wall needle under direct sonographic guidance.  Sonographic images were recorded pre- and post-puncture for  documentation. A .018-inch guidewire was advanced to the cavoatrial  junction.  The needle was removed and a peel-away sheath placed.   Fluoroscopic guidance was used to estimate appropriate catheter length.   The catheter was then trimmed and advanced through the peel-away sheath.   The sheath was removed.  The catheter  was flushed and capped.  The  catheter was then secured to the skin and a sterile dressing applied.   The catheter tip was  SVC/artery junction. Fluoroscopic image was  obtained to document catheter tip position.      Impression:        Successful placement of single lumen 4 -French PICC line  without complication.     Deborra Medina, MD   06/26/2014 3:28 PM              Signed by: Lucillie Garfinkel, MD

## 2014-06-27 NOTE — Plan of Care (Signed)
Problem: Pain  Goal: Patient's pain/discomfort is manageable  Outcome: Progressing  RN assessment done patient is a+ox4 denies pain at this time due care +meds given,call light +bedside table is with in reach will continue to monitor patient safety and needs.

## 2014-06-28 LAB — GLUCOSE WHOLE BLOOD - POCT
Whole Blood Glucose POCT: 100 mg/dL (ref 70–100)
Whole Blood Glucose POCT: 105 mg/dL — ABNORMAL HIGH (ref 70–100)
Whole Blood Glucose POCT: 110 mg/dL — ABNORMAL HIGH (ref 70–100)
Whole Blood Glucose POCT: 135 mg/dL — ABNORMAL HIGH (ref 70–100)
Whole Blood Glucose POCT: 86 mg/dL (ref 70–100)

## 2014-06-28 MED ORDER — SODIUM CHLORIDE 0.9 % IJ SOLN
10.0000 mL | Freq: Every day | INTRAMUSCULAR | Status: DC
Start: 2014-06-28 — End: 2014-07-03
  Administered 2014-06-29 – 2014-07-03 (×4): 10 mL

## 2014-06-28 NOTE — Progress Notes (Signed)
NEUROSURGERY PROGRESS NOTE    Date Time: 06/28/2014 1:36 PM  Patient Name: [name]  Consulting Attending Physician: Dr. Vivia Budge  Covered By: Neurosurgery PA Deland Pretty [phone]    Interim History:   No acute events overnight.  Low appetite today.      Medications:     Current Facility-Administered Medications   Medication Dose Route Frequency   . cefepime  2 g Intravenous Q8H SCH   . enoxaparin  40 mg Subcutaneous Q24H   . gabapentin  300 mg Oral 6X Daily   . insulin glargine  86 Units Subcutaneous QAM   . metFORMIN  1,000 mg Oral BID Meals   . metroNIDAZOLE  500 mg Intravenous Q8H SCH   . simvastatin  40 mg Oral QHS   . vancomycin  1,750 mg Intravenous Q12H         Physical Exam:     Filed Vitals:    06/28/14 1130   BP: 145/80   Pulse: 87   Temp: 98 F (36.7 C)   Resp: 18   SpO2: 96%       Intake and Output Summary (Last 24 hours) at Date Time  No intake or output data in the 24 hours ending 06/28/14 1336      Neuro exam:  AA&O x3  Sleeping in bed, easily arousable.  Speech clear, fluent  FC promptly   PERRL, EOMI  RUE and RLE 5/5  LUE and LLE 5/5    Labs:     Lab Results   Component Value Date    WBC 7.20 06/27/2014    HGB 13.3 06/27/2014    HCT 40.5* 06/27/2014    MCV 90.8 06/27/2014    PLT 225 06/27/2014     Lab Results   Component Value Date    NA 141 06/27/2014    K 4.1 06/27/2014    CL 108 06/27/2014    CO2 24 06/27/2014     Lab Results   Component Value Date    INR 1.0 09/30/2013    PT 12.9 09/30/2013       Rads:     Radiology Results (24 Hour)     Procedure Component Value Units Date/Time    CT Maxillofacial Bones [[id number]] Collected:  06/27/14 1700    Order Status:  Completed Updated:  06/27/14 1714    Narrative:      Clinical history: Inferior right frontal lobe abscess. Evaluate for bony  dehiscence. Paranasal sinus disease.    Comments: Unenhanced CT of the paranasal sinuses was performed with  landmarks protocol.    There is evidence of prior right frontal craniotomy and anterior  craniotomy  in the midline. 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 The middle and  superior turbinates are absent. A small portion of the left inferior  turbinate remains. The right inferior turbinate remains. The left  ethmoid air cells are obliterated and completely ossified. The left  maxillary sinus is obliterated and completely ossified. The right  maxillary sinus is small with chronic bony thickening and chronic  membrane thickening. This sphenoid sinuses are completely opacified with  membrane thickening and are small in size due to adjacent bony  thickening. The anterior right ethmoid air cells are absent with soft  tissue thickening in this area. The frontal sinuses have been previously  obliterated with small residual.    The medial wall of the right orbit disc broadly dehiscent. The area of  dehiscence measures 21 mm craniocaudal and 28 mm anteroposterior. There  is nonspecific soft tissue thickening in this area of dehiscence. Soft  tissue thickening extends up to the adjacent medial rectus muscle. There  is also extensive bony dehiscence of the right cribriform plate. This  area of dehiscence measures 4 mm transverse and 15 mm anteroposterior. A  small intact bony septum extends through a portion of this area of  dehiscence. There is a smaller area of bony dehiscence involving the  superomedial wall of the right orbit posteriorly measuring 2 mm x 2.6  mm.      Impression:       Prior sinonasal surgery. Sinonasal disease. Extensive bony  dehiscence medial wall right orbit. Extensive bony dehiscence right  cribriform plate. Small area of bony dehiscence superomedial aspect of  right orbit.    Melody Haver, MD   06/27/2014 5:10 PM            Assessment:   48 y/o M, h/o brain abscess, chronic sinus infections presents with MRI findings of recurrent brain abscess    Plan:   - Abx per ID  - Surgery planning is ongoing, OR date TBD.     Signed by: Rolinda Roan PA-C  Date/Time: 06/28/2014 1:36 PM

## 2014-06-28 NOTE — Progress Notes (Signed)
ID PROGRESS NOTE    Date Time: 06/28/2014 4:48 PM  Patient Name: [facility]      Problem List:    Acute  Worsening headaches  New changes in MRI suggesting abscess in R cribriform plate/ R frontal lobe and medial R orbit/ medial rectus muscle  Prior MSSA brain abscess in 09/2013, treated with drainage and IV abx  -------------------------------------  Chronic conditions  Diabetes mellitus  Sleep apnea  Extensive history of recurrent sinusitis  -- multiple sinus surgeries, including at least two craniotomies    Assessment:   Clinically stable.  Tolerating Abx for brain abscess.  Timing of surgery still pending    Antibiotics:   #6 Vancomycin  1.75 gm IV q12 hours  #6 Cefepime 2 gm IV q 8H  #6 Metronidazole 500mg  IV q 8H    Plan:   Continue current Abx  Will recheck Vancomycin trough  Surgical plans pending    Lines:   PICC Single Lumen 06/26/14 Right Basilic  *I have performed a risk-benefit analysis and the patient needs a central line for access and IV medications    Family History:     Family History   Problem Relation Age of Onset   . Stroke Neg Hx        Social History:     History     Social History   . Marital Status: Married                      Social History Main Topics   . Smoking status: Light Tobacco Smoker     Types: Cigars   . Smokeless tobacco: Never Used      Comment: Occasional Cigars   . Alcohol Use: 0.0 oz/week     4-5 Cans of beer per week   . Drug Use: No          Allergies:     Allergies   Allergen Reactions   . Morphine    . Penicillins        Review of Systems:   No events overnight.  No HA.  Felt dizzy last night.  No new complaints    Physical Exam:     Filed Vitals:    06/28/14 1530   BP: 134/81   Pulse: 84   Temp: 97.7 F (36.5 C)   Resp: 18   SpO2: 96%       General Appearance: alert and appropriate, non-toxic  Neuro: alert, oriented, normal speech, normal attention and cognition  HEENT: no scleral icterus, pupils round and reactive, OP clear, NCAT, EOMI  Neck: supple  Extremities: no  pedal edema, No c/c  Skin: no rash  Psych: normal mood and affect    Labs:     Lab Results   Component Value Date    WBC 7.20 06/27/2014    HGB 13.3 06/27/2014    HCT 40.5* 06/27/2014    MCV 90.8 06/27/2014    PLT 225 06/27/2014     Lab Results   Component Value Date    CREAT 0.8 06/27/2014     Lab Results   Component Value Date    ALT 52 10/17/2013    AST 39* 10/17/2013    ALKPHOS 74 10/17/2013    BILITOTAL 0.4 10/17/2013     No results found for: LACTATE    Microbiology:     Microbiology Results     Procedure Component Value Units Date/Time    CULTURE BLOOD AEROBIC AND ANAEROBIC [[id number]] Collected:  06/23/14 2124    Specimen Information:  Blood, Venipuncture Updated:  06/27/14 2321    Narrative:      ORDER#: [id number]                                    ORDERED BY: Candy Sledge  SOURCE: Blood, Venipuncture rue                      COLLECTED:  06/23/14 21:24  ANTIBIOTICS AT COLL.:                                RECEIVED :  06/23/14 23:00  Culture Blood Aerobic and Anaerobic        PRELIM      06/27/14 23:21  06/24/14   No Growth after 1 day/s of incubation.  06/25/14   No Growth after 2 day/s of incubation.  06/26/14   No Growth after 3 day/s of incubation.  06/27/14   No Growth after 4 day/s of incubation.      CULTURE BLOOD AEROBIC AND ANAEROBIC [[id number]] Collected:  06/23/14 2055    Specimen Information:  Blood, Venipuncture Updated:  06/27/14 2221    Narrative:      ORDER#: [id number]                                    ORDERED BY: Candy Sledge  SOURCE: Blood, Venipuncture lue                      COLLECTED:  06/23/14 20:55  ANTIBIOTICS AT COLL.:                                RECEIVED :  06/23/14 22:09  Culture Blood Aerobic and Anaerobic        PRELIM      06/27/14 22:21  06/24/14   No Growth after 1 day/s of incubation.  06/25/14   No Growth after 2 day/s of incubation.  06/26/14   No Growth after 3 day/s of incubation.  06/27/14   No Growth after 4 day/s of incubation.            Rads:   Ct  Maxillofacial Bones    06/27/2014    Prior sinonasal surgery. Sinonasal disease. Extensive bony dehiscence medial wall right orbit. Extensive bony dehiscence right cribriform plate. Small area of bony dehiscence superomedial aspect of right orbit.  Melody Haver, MD  06/27/2014 5:10 PM       Signed by: Micheline Rough, MD

## 2014-06-28 NOTE — Plan of Care (Signed)
Patient alert x4, independent OOB, HA treated with neurontin and Ibuprofen together X1 with effective results. Surgery planning in place, Patient is aware that co-ordination of several MDs is required.

## 2014-06-28 NOTE — Plan of Care (Addendum)
Received patient awake, wife at bedside, patient requesting to go to cafeteria. RN Gunnar Fusi paged and spoke with Dr. Cyndie Chime , got an order that it is ok for patient  to go to cafeteria with his wife, off tele for the meantime.   Patient verbalized he is still having dizziness ( baseline on admission). Encouraged to call for assistance PRN, he indicated understanding. Call bell accessible. Blood sugar @ HS= 86. Patient verbalized he has poor appetite maybe due to antibiotics . Bedtime snacks provided since patient received a large dose of Lantus insulin this morning  per report. Blood sugar monitored, within normal limits.  Medicated with Ibuprofen around midnight for mild headache, afforded relief.

## 2014-06-28 NOTE — Progress Notes (Signed)
[facility] HOSPITALIST PROGRESS NOTE    Date Time: 06/28/2014 9:38 AM  Patient Name: [facility]  Attending Physician: Balinda Quails Man, MD    Assessment:   1. Brain abscess   2. 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 Extensive history of recurrent sinusitis  4. DM  5. OSA  6. HTN  7. HLD  8. Chronic headache     Plan:    Continue with vancomycin, flagyl and cefepime. ID following. NSR following, planning to do surgery together with plastic surgery- date has not been decided yet. Pt is asking about ENT consult, told him that this should be addressed with Dr. Jaynie Collins about it if that helps with the management    His sugar seems to be well conntrolled with metformin and Lantus this time.     On home CPAP   Continue statin   Continue neurontin    Pain control     Case discussed with: pt and ID    Safety Checklist:     DVT prophylaxis:  CHEST guideline (See page [id number]) Mechanical and chemical    Foley:  [place] Rn Foley protocol Not present   IVs:  Peripheral IV   PT/OT: Not needed   Daily CBC & or Chem ordered:  SHM/ABIM guidelines (see #5) No       Lines:     Active PICC Line / CVC Line / PIV Line / Drain / Airway / Intraosseous Line / Epidural Line / ART Line / Line / Wound / Pressure Ulcer / NG/OG Tube     Name:   Placement date:   Placement time:   Site:   Days:    Peripheral IV 06/23/14 Left Forearm  06/23/14      Forearm   1           Disposition:     Today's date: 06/28/2014  Length of Stay: 5  Anticipated medical stability for discharge: 1-2 days   Reason for ongoing hospitalization: brain abscess   Anticipated discharge needs: TBD    Subjective     CC: Brain abscess    Interval History/24 hour events/subjective: No any new changes noted. No bleeding from his nose, no headache. No numbness and tingling. No any acute events happened.     HPI: [name] Brown is a 48 y.o. male w/ h/o DM type 2, chronic headache, OSA, HLD, sinusitis, HTN, brain abscess s/p craniotomy, iv Rocephin for MSSA who p/w recurrent brain  abscess on recent MRI. Pt reports bilateral retroorbital headaches weekly at around 2-3am which wake him up. Pt reports intermittent photophobia but denies visual changes or N/V. Pt denies F/C/D/SOB/CP/abd pain or any other sx's. Pt was admitted to NSICU on 09/30/13, d/c'ed to rehab on 10/07/14. Pt was d/c'ed home on 10/18/13.    Review of Systems:     See above    Physical Exam:     VITAL SIGNS PHYSICAL EXAM   Temp:  [96.3 F (35.7 C)-98.6 F (37 C)] 96.3 F (35.7 C)  Heart Rate:  [59-90] 59  Resp Rate:  [16-18] 18  BP: (119-162)/(70-87) 134/70 mmHg  Blood Glucose:    Telemetry: NSR    No intake or output data in the 24 hours ending 06/28/14 0938 General: No apparent distress, comfortable appearing, vital signs noted   Resp: Clear to ausculation, no wheezing, rhonci, or rales noted; normal respiratory effort   CV: Regular rate and rhythm, no murmurs, gallops, or rubs; no peripheral edema   GI: Abdomen  obese, soft, non-tender, no masses; no hepatosplenomegaly   Skin: Normal temperature, tone, turgor; no rash, lesions, ulcers   Musc: No digital cyanosis or ischemia   Neuro: AOx3, CN 2-12 grossly intact, power 5/5 allover, sensation intact, DTR +1         Meds:     Medications were reviewed:    Labs:     Labs (last 72 hours):      Recent Labs  Lab 06/27/14  0357 06/23/14  1324   WBC 7.20 10.53   HEMOGLOBIN 13.3 15.1   HEMATOCRIT 40.5* 44.6   PLATELETS 225 266       No results for input(s): PT, INR, PTT in the last 168 hours.   Recent Labs  Lab 06/27/14  0357 06/26/14  0417   SODIUM 141 139   POTASSIUM 4.1 4.2   CHLORIDE 108 108   CO2 24 24   BUN 12.0 13.0   CREATININE 0.8 0.7   CALCIUM 8.1* 8.3*   GLUCOSE 119* 102*                   Microbiology, reviewed and are significant for:  Microbiology Results     Procedure Component Value Units Date/Time    CULTURE BLOOD AEROBIC AND ANAEROBIC [[id number]] Collected:  06/23/14 2124    Specimen Information:  Blood, Venipuncture Updated:  06/23/14 2300    Narrative:      1  BLUE+1 PURPLE    CULTURE BLOOD AEROBIC AND ANAEROBIC [[id number]] Collected:  06/23/14 2055    Specimen Information:  Blood, Venipuncture Updated:  06/23/14 2209    Narrative:      1 BLUE+1 PURPLE          Imaging, reviewed and are significant for:  Radiology Results (24 Hour)     Procedure Component Value Units Date/Time    CT Maxillofacial Bones [[id number]] Collected:  06/27/14 1700    Order Status:  Completed Updated:  06/27/14 1714    Narrative:      Clinical history: Inferior right frontal lobe abscess. Evaluate for bony  dehiscence. Paranasal sinus disease.    Comments: Unenhanced CT of the paranasal sinuses was performed with  landmarks protocol.    There is evidence of prior right frontal craniotomy and anterior  craniotomy in the midline. 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 The middle and  superior turbinates are absent. A small portion of the left inferior  turbinate remains. The right inferior turbinate remains. The left  ethmoid air cells are obliterated and completely ossified. The left  maxillary sinus is obliterated and completely ossified. The right  maxillary sinus is small with chronic bony thickening and chronic  membrane thickening. This sphenoid sinuses are completely opacified with  membrane thickening and are small in size due to adjacent bony  thickening. The anterior right ethmoid air cells are absent with soft  tissue thickening in this area. The frontal sinuses have been previously  obliterated with small residual.    The medial wall of the right orbit disc broadly dehiscent. The area of  dehiscence measures 21 mm craniocaudal and 28 mm anteroposterior. There  is nonspecific soft tissue thickening in this area of dehiscence. Soft  tissue thickening extends up to the adjacent medial rectus muscle. There  is also extensive bony dehiscence of the right cribriform plate. This  area of dehiscence measures 4 mm transverse and 15  mm anteroposterior. A  small intact bony  septum extends through a portion of this area of  dehiscence. There is a smaller area of bony dehiscence involving the  superomedial wall of the right orbit posteriorly measuring 2 mm x 2.6  mm.      Impression:       Prior sinonasal surgery. Sinonasal disease. Extensive bony  dehiscence medial wall right orbit. Extensive bony dehiscence right  cribriform plate. Small area of bony dehiscence superomedial aspect of  right orbit.    Melody Haver, MD   06/27/2014 5:10 PM              Signed by: Lucillie Garfinkel, MD

## 2014-06-28 NOTE — Consults (Signed)
Service Date: 06/27/2014     Patient Type: I     CONSULTING PHYSICIAN: Dorinda Hill MD     REFERRING PHYSICIAN:      ADMISSION DIAGNOSES:  1.  Frontal lobe brain abscess.  2.  Chronic ethmoid sinusitis.  3.  Chronic sphenoid sinusitis.  4.  Chronic frontal sinusitis.     HISTORY OF PRESENT ILLNESS:  This 48 year old gentleman was recently treated for anterior fossa brain  abscess which was urgently surgically decompressed in December.  He also  received intravenous antibiotics for an extended period of time.  He has a  very long history of sinus inflammation, infection, and has undergone  multiple surgical procedures.  His last surgical procedure took place over  8 years ago at [place].  He recalls suffering Pott's puffy tumor and has  had at least 2 frontal sinus surgeries.  He has surgical incisions at the  medial canthus area consistent with a Lynch incisions.  He no longer has  the sense of smell.  He is plagued by recurring dried mucus which  occasionally is tinged with blood.  He has a daily routine of irrigating  his nasal cavity in effort to clear the crust.  He is complaining of   right-sided headache over the frontal sinus area.     PAST MEDICAL HISTORY:  As above, diabetes, hypertension, obesity.     PHYSICAL EXAMINATION:  His ocular movements are intact.  There is no cervical adenopathy.  I see  the incisions including the bicoronal incision as well as the bilateral  Lynch incisions.  There is no significant change to the contour of the  anterior table of the frontal sinus.  There are no mass lesions obviously  in the nasal cavity.  He does have crusting.     DIAGNOSTIC DATA:   MRI scan reviewed with a neuroradiologist today indicates a possible  communication to the anterior fossa through a posterior ethmoid cell on the  right side.  In addition, the soft tissue in the sphenoid sinus is not just  fluid.  The patient does not recall a specific operation to the  sphenoid  sinus.  The frontal sinus appears  intact and is not in communication with  the abscess in the frontal lobe.     IMPRESSION:  This 48 year old gentleman has a past history of a brain abscess with  complaints of frontal headache and findings of enhancing lesions in the  right frontal area suggestive of developing brain abscess.  There is a  suggestion of posterior ethmoid sinus that is dehiscent at the fovea ethmoidalis  and may be in communication with the anterior fossa.  He also has  obstructed sphenoid sinusitis.  The patient is stable without evidence of  meningitis or leukocytosis and without any cognitive deficits.  I and need  a CT scan of the sinuses, thin cuts with navigation protocol in an effort  to better evaluate the ethmoid sinus and the sphenoid sinus.  The frontal  sinus has been previously obliterated and does not appear to be involved  with the current frontal lobe process.  I will communicate with Dr. Jaynie Collins  after the CT scan of the sinus is available.           D:  06/28/2014 07:53 AM by Dr. Dorinda Hill, MD [phone])  T:  06/28/2014 09:59 AM by Carmie End      Everlean Cherry: [id number]) (Doc ID: [id number])

## 2014-06-28 NOTE — Plan of Care (Signed)
Pt A&Ox4, moving all extremities, OOB independent, mild dizziness at times -aware of need to change positions slowly. Photophobic-wearing sunglasses.  Mild anterior HA-received Ibuprofen and scheduled neurontin with good relief.  RUE PICC for multiple IV antibiotics.  Tolerating PO but reported decreased appetite today.  BS stable, encouraged small meals/snacks.  BM x1.  Safety reinforced:  Side rails up, call bell with in reach.  Refused to have fall mat as it impedes his ambulation with IV poll-trip hazard.  Wife updated on Plan of Care.  Will continue to monitor.

## 2014-06-29 LAB — VANCOMYCIN, TROUGH: Vancomycin Trough: 11.9 ug/mL (ref 10.0–20.0)

## 2014-06-29 LAB — GLUCOSE WHOLE BLOOD - POCT
Whole Blood Glucose POCT: 89 mg/dL (ref 70–100)
Whole Blood Glucose POCT: 98 mg/dL (ref 70–100)
Whole Blood Glucose POCT: 159 mg/dL — ABNORMAL HIGH (ref 70–100)
Whole Blood Glucose POCT: 135 mg/dL — ABNORMAL HIGH (ref 70–100)
Whole Blood Glucose POCT: 102 mg/dL — ABNORMAL HIGH (ref 70–100)
Whole Blood Glucose POCT: 126 mg/dL — ABNORMAL HIGH (ref 70–100)

## 2014-06-29 LAB — HEMOGLOBIN A1C: Hemoglobin A1C: 6.3 % — ABNORMAL HIGH (ref 0.0–6.0)

## 2014-06-29 MED ORDER — VANCOMYCIN HCL 1000 MG IV SOLR
2000.0000 mg | Freq: Two times a day (BID) | INTRAVENOUS | Status: DC
Start: 2014-06-29 — End: 2014-07-02
  Administered 2014-06-30 – 2014-07-01 (×3): 2000 mg via INTRAVENOUS
  Administered 2014-07-01: 2 g via INTRAVENOUS
  Administered 2014-07-02: 2000 mg via INTRAVENOUS
  Filled 2014-06-29 (×8): qty 2000

## 2014-06-29 NOTE — Plan of Care (Signed)
Problem: Psychosocial and Spiritual Needs  Goal: Demonstrates ability to cope with hospitalization/illness  Quiet and semi-dark environment provided, due to patient's light sensitivity.  Night mask offered but pt explained not appropriate with C-PAP.  Encouraged pt to maintain his baseline pain level and to express any concerns with physicians.  Pt complains of dizziness.  Told to call staff for ambulation, when needed.  Pt is compliant.  Pt did discuss dizziness with ENT physician.

## 2014-06-29 NOTE — Progress Notes (Signed)
RUE PICC running NS KVO.  Vanco trough therapeutic at 11.9.  ACHS; held Lantus; gave Metformin.  Blood sugars run low.  No tele.  Continent x2; BM this shift.  Has order to go off unit unmonitored, with wife.  C-PAP; self-administered.  CHO diet; poor appetite.  Discussed dizziness with ENT physician.  Surgery to remove mass and re-graft planned for Saturday, 5 September.

## 2014-06-29 NOTE — Progress Notes (Signed)
CNS HOSPITALIST PROGRESS NOTE    Date Time: 06/29/2014 9:08 AM  Patient Name: [name]  Attending Physician: Balinda Quails Man, MD    Assessment:   1. Brain abscess   2. 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 Extensive history of recurrent sinusitis  4. DM  5. OSA  6. HTN  7. HLD  8. Chronic headache     Plan:    Continue with vancomycin, flagyl and cefepime (# 7). ID following. NSR following, planning to do surgery together with plastic surgery/ENT- most likely on Monday. Will continue IV antibiotics. Had PICC already. Blood cultures--> NGTD. 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 Extensive bony dehiscence medial wall right orbit. Extensive bony dehiscence right  cribriform plate. Small area of bony dehiscence superomedial aspect of right orbit.    His sugar seems to be well conntrolled with metformin and Lantus this time.     On home CPAP   Continue statin   Continue neurontin    Pain control     Case discussed with: pt and NS team/RN    Safety Checklist:     DVT prophylaxis:  CHEST guideline (See page e199S) Mechanical and chemical    Foley:  [place] Rn Foley protocol Not present   IVs:  Peripheral IV   PT/OT: Not needed   Daily CBC & or Chem ordered:  SHM/ABIM guidelines (see #5) No       Lines:     Active PICC Line / CVC Line / PIV Line / Drain / Airway / Intraosseous Line / Epidural Line / ART Line / Line / Wound / Pressure Ulcer / NG/OG Tube     Name:   Placement date:   Placement time:   Site:   Days:    Peripheral IV 06/23/14 Left Forearm  06/23/14      Forearm   1           Disposition:     Today's date: 06/29/2014  Length of Stay: 6  Anticipated medical stability for discharge: 1-2 days   Reason for ongoing hospitalization: brain abscess   Anticipated discharge needs: TBD    Subjective     CC: Brain abscess    Interval History/24 hour events/subjective: waiting for surgery. No any new changes noted. No bleeding from his nose, no headache. No numbness and tingling. No any acute events  happened.     HPI: [name] Brown is a 48 y.o. male w/ h/o DM type 2, chronic headache, OSA, HLD, sinusitis, HTN, brain abscess s/p craniotomy, iv Rocephin for MSSA who p/w recurrent brain abscess on recent MRI. Pt reports bilateral retroorbital headaches weekly at around 2-3am which wake him up. Pt reports intermittent photophobia but denies visual changes or N/V. Pt denies F/C/D/SOB/CP/abd pain or any other sx's. Pt was admitted to NSICU on 09/30/13, d/c'ed to rehab on 10/07/14. Pt was d/c'ed home on 10/18/13.    Review of Systems:     See above    Physical Exam:     VITAL SIGNS PHYSICAL EXAM   Temp:  [96 F (35.6 C)-98.6 F (37 C)] 96 F (35.6 C)  Heart Rate:  [72-89] 72  Resp Rate:  [16-18] 16  BP: (128-149)/(71-83) 128/71 mmHg  Blood Glucose:    Telemetry: NSR      Intake/Output Summary (Last 24 hours) at 06/29/14 0908  Last data filed at 06/29/14 0639   Gross per 24 hour   Intake   1400 ml   Output  0 ml   Net   1400 ml    General: No apparent distress, comfortable appearing, vital signs noted   Resp: Clear to ausculation, no wheezing, rhonci, or rales noted; normal respiratory effort   CV: Regular rate and rhythm, no murmurs, gallops, or rubs; no peripheral edema   GI: Abdomen obese, soft, non-tender, no masses; no hepatosplenomegaly   Skin: Normal temperature, tone, turgor; no rash, lesions, ulcers   Musc: No digital cyanosis or ischemia   Neuro: AOx3, CN 2-12 grossly intact, power 5/5 allover, sensation intact, DTR +1         Meds:     Medications were reviewed:    Labs:     Labs (last 72 hours):      Recent Labs  Lab 06/27/14  0357 06/23/14  1324   WBC 7.20 10.53   HEMOGLOBIN 13.3 15.1   HEMATOCRIT 40.5* 44.6   PLATELETS 225 266       No results for input(s): PT, INR, PTT in the last 168 hours.   Recent Labs  Lab 06/27/14  0357 06/26/14  0417   SODIUM 141 139   POTASSIUM 4.1 4.2   CHLORIDE 108 108   CO2 24 24   BUN 12.0 13.0   CREATININE 0.8 0.7   CALCIUM 8.1* 8.3*   GLUCOSE 119* 102*                    Microbiology, reviewed and are significant for:  Microbiology Results     Procedure Component Value Units Date/Time    CULTURE BLOOD AEROBIC AND ANAEROBIC [[id number]] Collected:  06/23/14 2124    Specimen Information:  Blood, Venipuncture Updated:  06/23/14 2300    Narrative:      1 BLUE+1 PURPLE    CULTURE BLOOD AEROBIC AND ANAEROBIC [[id number]] Collected:  06/23/14 2055    Specimen Information:  Blood, Venipuncture Updated:  06/23/14 2209    Narrative:      1 BLUE+1 PURPLE          Imaging, reviewed and are significant for:  Radiology Results (24 Hour)     ** No results found for the last 24 hours. **            Signed by: Lucillie Garfinkel, MD

## 2014-06-29 NOTE — Progress Notes (Signed)
NEUROSURGERY PROGRESS NOTE    Date Time: 06/29/2014 11:57 AM  Patient Name: [facility]  Consulting Attending Physician: Dr. Vivia Budge  Covered By: Neurosurgery PA Deland Pretty [phone]    Interim History:   No acute events overnight  No new complaints.    Medications:     Current Facility-Administered Medications   Medication Dose Route Frequency   . cefepime  2 g Intravenous Q8H SCH   . enoxaparin  40 mg Subcutaneous Q24H   . gabapentin  300 mg Oral 6X Daily   . insulin glargine  86 Units Subcutaneous QAM   . metFORMIN  1,000 mg Oral BID Meals   . metroNIDAZOLE  500 mg Intravenous Q8H SCH   . simvastatin  40 mg Oral QHS   . sodium chloride (PF)  10 mL Intracatheter Daily   . vancomycin  1,750 mg Intravenous Q12H         Physical Exam:     Filed Vitals:    06/29/14 1111   BP: 144/77   Pulse: 82   Temp: 97.6 F (36.4 C)   Resp: 16   SpO2: 96%       Intake and Output Summary (Last 24 hours) at Date Time    Intake/Output Summary (Last 24 hours) at 06/29/14 1157  Last data filed at 06/29/14 0639   Gross per 24 hour   Intake   1400 ml   Output      0 ml   Net   1400 ml         Neuro exam:  AA&O x3  Sitting up in bed in NAD.  Speech clear, fluent  FC promptly   PERRL, EOMI  RUE and RLE 5/5  LUE and LLE 5/5    Labs:     Lab Results   Component Value Date    WBC 7.20 06/27/2014    HGB 13.3 06/27/2014    HCT 40.5* 06/27/2014    MCV 90.8 06/27/2014    PLT 225 06/27/2014     Lab Results   Component Value Date    NA 141 06/27/2014    K 4.1 06/27/2014    CL 108 06/27/2014    CO2 24 06/27/2014     Lab Results   Component Value Date    INR 1.0 09/30/2013    PT 12.9 09/30/2013       Rads:     Radiology Results (24 Hour)     ** No results found for the last 24 hours. **          Assessment:   48 y/o M, h/o brain abscess, chronic sinus infections presents with MRI findings of recurrent brain abscess    Plan:   - Abx per ID  - OR scheduled for Saturday, Sept 5th   - NPO Friday at midnight.     Signed by: Rolinda Roan  PA-C  Date/Time: 06/29/2014 11:57 AM

## 2014-06-29 NOTE — Progress Notes (Signed)
ID PROGRESS NOTE    Date Time: 06/29/2014 1:39 PM  Patient Name: [facility]      Problem List:    Acute  Worsening headaches  New changes in MRI suggesting abscess in R cribriform plate/ R frontal lobe and medial R orbit/ medial rectus muscle  Prior MSSA brain abscess in 09/2013, treated with drainage and IV abx  Diarrhea chronic  -------------------------------------  Chronic conditions  Diabetes mellitus  Sleep apnea  Extensive history of recurrent sinusitis  -- multiple sinus surgeries, including at least two craniotomies    Assessment:   Clinically stable. Tolerating Abx for brain abscess    Antibiotics:   #7 Vancomycin 1.75 gm IV q12 hours trough 11.9  06/29/2014---> increase to 2 gm IV Q 12 hr  #7 Cefepime 2 gm IV q 8H  #7 Metronidazole 500mg  IV q 8H    Plan:   Continue current Abx; final cx will determine antibiotic choice  Surgical  Tentatively 9/5  Check IgG/ subtypes with hx  recurrent sinusitis  Vancomycin increased to 2 gm IV Q 12    D/w pt  Lines:   PICC 8/31/2015right basilic    *I have performed a risk-benefit analysis and the patient needs a central line for access and IV medications    Family History:     Family History   Problem Relation Age of Onset   . Stroke Neg Hx        Social History:     History     Social History   . Marital Status: Married     Spouse Name: N/A     Number of Children: N/A   . Years of Education: N/A     Social History Main Topics   . Smoking status: Light Tobacco Smoker     Types: Cigars   . Smokeless tobacco: Never Used      Comment: Occasional Cigars   . Alcohol Use: 0.0 oz/week     4-5 Cans of beer per week   . Drug Use: No   . Sexual Activity: Not on file     Other Topics Concern   . Not on file     Social History Narrative       Allergies:     Allergies   Allergen Reactions   . Morphine    . Penicillins        Review of Systems:   No fevers  No cough  No rash  Diarrhea but chronic    Physical Exam:     Filed Vitals:    06/29/14 1111   BP: 144/77   Pulse: 82   Temp: 97.6  F (36.4 C)   Resp: 16   SpO2: 96%     General Appearance: alert and appropriate, non-toxic  Neuro: alert, oriented, normal speech, normal attention and cognition  HEENT: no scleral icterus, pupils round and reactive, OP clear  Neck: supple  Lungs: clear to auscultation, no wheezes, rales or rhonchi, symmetric air entry   Cardiac: normal rate, regular rhythm, normal S1, S2,   Abdomen: soft, non-tender, non-distended, normal active bowel sounds  Extremities: no pedal edema  Skin: no rash  Psych: normal mood and affect    Labs:     Lab Results   Component Value Date    WBC 7.20 06/27/2014    HGB 13.3 06/27/2014    HCT 40.5* 06/27/2014    MCV 90.8 06/27/2014    PLT 225 06/27/2014     Lab Results  Component Value Date    CREAT 0.8 06/27/2014     Lab Results   Component Value Date    ALT 52 10/17/2013    AST 39* 10/17/2013    ALKPHOS 74 10/17/2013    BILITOTAL 0.4 10/17/2013     No results found for: LACTATE    Microbiology:     Microbiology Results     Procedure Component Value Units Date/Time    CULTURE BLOOD AEROBIC AND ANAEROBIC [[id number]] Collected:  06/23/14 2124    Specimen Information:  Blood, Venipuncture Updated:  06/29/14 0121    Narrative:      ORDER#: [id number]                                    ORDERED BY: Candy Sledge  SOURCE: Blood, Venipuncture rue                      COLLECTED:  06/23/14 21:24  ANTIBIOTICS AT COLL.:                                RECEIVED :  06/23/14 23:00  Culture Blood Aerobic and Anaerobic        FINAL       06/29/14 01:21  06/29/14   No growth after 5 days of incubation.      CULTURE BLOOD AEROBIC AND ANAEROBIC [[id number]] Collected:  06/23/14 2055    Specimen Information:  Blood, Venipuncture Updated:  06/29/14 0022    Narrative:      ORDER#: [id number]                                    ORDERED BY: Candy Sledge  SOURCE: Blood, Venipuncture lue                      COLLECTED:  06/23/14 20:55  ANTIBIOTICS AT COLL.:                                RECEIVED :  06/23/14  22:09  Culture Blood Aerobic and Anaerobic        FINAL       06/29/14 00:21  06/29/14   No growth after 5 days of incubation.            Rads:   No results found.    Signed by: Dorena Cookey, MD

## 2014-06-30 LAB — GLUCOSE WHOLE BLOOD - POCT
Whole Blood Glucose POCT: 124 mg/dL — ABNORMAL HIGH (ref 70–100)
Whole Blood Glucose POCT: 158 mg/dL — ABNORMAL HIGH (ref 70–100)
Whole Blood Glucose POCT: 95 mg/dL (ref 70–100)
Whole Blood Glucose POCT: 129 mg/dL — ABNORMAL HIGH (ref 70–100)

## 2014-06-30 LAB — PT/INR
PT INR: 1.1 (ref 0.9–1.1)
PT: 14.2 s (ref 12.6–15.0)

## 2014-06-30 LAB — IGG, IGA, IGM
Immunoglobulin A: 346 mg/dL (ref 63–484)
Immunoglobulin G: 1606 mg/dL (ref 540–1822)
Immunoglobulin M: 85 mg/dL (ref 22–293)

## 2014-06-30 LAB — TYPE AND SCREEN
AB Screen Gel: NEGATIVE
ABO Rh: A NEG

## 2014-06-30 MED ORDER — SODIUM CHLORIDE 0.9 % IV SOLN
INTRAVENOUS | Status: DC
Start: 2014-07-01 — End: 2014-07-02

## 2014-06-30 NOTE — Plan of Care (Signed)
Problem: Pain  Goal: Patient's pain/discomfort is manageable  Outcome: Progressing  Pt presents with headache, scheduled Neurontin administered and pt verbalized relief.     Problem: Neurological Deficit  Goal: Neurological status is stable or improving  Outcome: Progressing  Pt neuro status WNL, A&OX4

## 2014-06-30 NOTE — Progress Notes (Signed)
NEUROSURGERY PROGRESS NOTE    Date Time: 06/30/2014 11:24 AM  Patient Name: [facility]  Consulting Attending Physician: Dr. Vivia Budge  Covered By: Neurosurgery PA Deland Pretty [phone]    Interim History:   No acute events overnight  Pt has no complaints.    Medications:     Current Facility-Administered Medications   Medication Dose Route Frequency   . cefepime  2 g Intravenous Q8H SCH   . enoxaparin  40 mg Subcutaneous Q24H   . gabapentin  300 mg Oral 6X Daily   . insulin glargine  86 Units Subcutaneous QAM   . metFORMIN  1,000 mg Oral BID Meals   . metroNIDAZOLE  500 mg Intravenous Q8H SCH   . simvastatin  40 mg Oral QHS   . sodium chloride (PF)  10 mL Intracatheter Daily   . vancomycin  2,000 mg Intravenous Q12H         Physical Exam:     Filed Vitals:    06/30/14 0706   BP: 126/75   Pulse: 70   Temp: 96.5 F (35.8 C)   Resp: 18   SpO2: 94%       Intake and Output Summary (Last 24 hours) at Date Time  No intake or output data in the 24 hours ending 06/30/14 1124    Neuro exam:  AA&O x3  Sitting up in bed in NAD.  Speech clear, fluent  FC promptly   PERRL, EOMI  RUE and RLE 5/5  LUE and LLE 5/5    Labs:     Lab Results   Component Value Date    WBC 7.20 06/27/2014    HGB 13.3 06/27/2014    HCT 40.5* 06/27/2014    MCV 90.8 06/27/2014    PLT 225 06/27/2014     Lab Results   Component Value Date    NA 141 06/27/2014    K 4.1 06/27/2014    CL 108 06/27/2014    CO2 24 06/27/2014     Lab Results   Component Value Date    INR 1.1 06/30/2014    INR 1.0 09/30/2013    PT 14.2 06/30/2014    PT 12.9 09/30/2013       Rads:     Radiology Results (24 Hour)     ** No results found for the last 24 hours. **          Assessment:   48 y/o M, h/o brain abscess, chronic sinus infections presents with MRI findings of recurrent brain abscess    Plan:   Abx per ID  OR scheduled for tomorrow @ 8:00 am  Signed consent placed in chart  NPO after MN     Signed by: Rolinda Roan PA-C  Date/Time: 06/30/2014 11:24 AM

## 2014-06-30 NOTE — Plan of Care (Signed)
Problem: Potential for Compromised Skin Integrity  Goal: Skin integrity is maintained or improved  Assess and monitor skin integrity. Identify patients at risk for skin breakdown on admission and per policy. Collaborate with interdisciplinary team and initiate plans and interventions as needed.   Intervention: Monitor patient's hygiene practices  We have monitored the pt's hygiene practises.  Compliant with using the CHG bath, as he has a PICC line.  Independent with mouth care and grooming.   Cleanses daily with CHG.

## 2014-06-30 NOTE — Progress Notes (Signed)
Type & screen done.  BM this shift.  Vanco trough ordered for 03:30 on 5 September.  Pt will be NPO after midnight for OR procedure at 08:00.  NS @ 75 cc through RUE PICC after NPO.

## 2014-06-30 NOTE — Progress Notes (Signed)
CNS HOSPITALIST PROGRESS NOTE    Date Time: 06/30/2014 7:50 AM  Patient Name: [facility]  Attending Physician: Balinda Quails Man, MD    Assessment:   1. Brain abscess   2. 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 Extensive history of recurrent sinusitis  4. DM  5. OSA  6. HTN  7. HLD  8. Chronic headache     Plan:    Continue with vancomycin, flagyl and cefepime (# 8). ID following. NSR following, planning to do surgery together with plastic surgery/ENT- most likely on 9/5. NPO from mid night today.  Will continue IV antibiotics. Had PICC already. Blood cultures--> NGTD. 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 Extensive bony dehiscence medial wall right orbit. Extensive bony dehiscence right  cribriform plate. Small area of bony dehiscence superomedial aspect of right orbit.    His sugar seems to be well conntrolled with metformin and Lantus this time. HbA1c- 6.3. Will hold Lantus (86 units daily in AM) and Metformin (1000 mg PO BID) since tonight.   On home CPAP   Continue statin   Continue neurontin    Pain control     Case discussed with: pt and NS team/RN    Safety Checklist:     DVT prophylaxis:  CHEST guideline (See page e199S) Mechanical and chemical    Foley:  [place] Rn Foley protocol Not present   IVs:  Peripheral IV   PT/OT: Not needed   Daily CBC & or Chem ordered:  SHM/ABIM guidelines (see #5) No       Lines:     Active PICC Line / CVC Line / PIV Line / Drain / Airway / Intraosseous Line / Epidural Line / ART Line / Line / Wound / Pressure Ulcer / NG/OG Tube     Name:   Placement date:   Placement time:   Site:   Days:    Peripheral IV 06/23/14 Left Forearm  06/23/14      Forearm   1           Disposition:     Today's date: 06/30/2014  Length of Stay: 7  Anticipated medical stability for discharge: 1-2 days   Reason for ongoing hospitalization: brain abscess   Anticipated discharge needs: TBD    Subjective     CC: Brain abscess    Interval History/24 hour events/subjective: waiting for  surgery on 9/5. No any new changes noted. No bleeding from his nose, no headache. No numbness and tingling. No any acute events happened.     HPI: [name] Brown is a 48 y.o. male w/ h/o DM type 2, chronic headache, OSA, HLD, sinusitis, HTN, brain abscess s/p craniotomy, iv Rocephin for MSSA who p/w recurrent brain abscess on recent MRI. Pt reports bilateral retroorbital headaches weekly at around 2-3am which wake him up. Pt reports intermittent photophobia but denies visual changes or N/V. Pt denies F/C/D/SOB/CP/abd pain or any other sx's. Pt was admitted to NSICU on 09/30/13, d/c'ed to rehab on 10/07/14. Pt was d/c'ed home on 10/18/13.    Review of Systems:     See above    Physical Exam:     VITAL SIGNS PHYSICAL EXAM   Temp:  [95.8 F (35.4 C)-98.3 F (36.8 C)] 96.5 F (35.8 C)  Heart Rate:  [62-89] 70  Resp Rate:  [16-18] 18  BP: (119-144)/(61-82) 126/75 mmHg  Blood Glucose:    Telemetry: NSR    No intake or output data in the 24 hours ending 06/30/14 0750 General:  No apparent distress, comfortable appearing, vital signs noted   Resp: Clear to ausculation, no wheezing, rhonci, or rales noted; normal respiratory effort   CV: Regular rate and rhythm, no murmurs, gallops, or rubs; no peripheral edema   GI: Abdomen obese, soft, non-tender, no masses; no hepatosplenomegaly   Skin: Normal temperature, tone, turgor; no rash, lesions, ulcers   Musc: No digital cyanosis or ischemia   Neuro: AOx3, CN 2-12 grossly intact, power 5/5 allover, sensation intact, DTR +1         Meds:     Medications were reviewed:    Labs:     Labs (last 72 hours):      Recent Labs  Lab 06/27/14  0357 06/23/14  1324   WBC 7.20 10.53   HEMOGLOBIN 13.3 15.1   HEMATOCRIT 40.5* 44.6   PLATELETS 225 266       No results for input(s): PT, INR, PTT in the last 168 hours.   Recent Labs  Lab 06/27/14  0357 06/26/14  0417   SODIUM 141 139   POTASSIUM 4.1 4.2   CHLORIDE 108 108   CO2 24 24   BUN 12.0 13.0   CREATININE 0.8 0.7   CALCIUM 8.1* 8.3*   GLUCOSE  119* 102*                   Microbiology, reviewed and are significant for:  Microbiology Results     Procedure Component Value Units Date/Time    CULTURE BLOOD AEROBIC AND ANAEROBIC [[id number]] Collected:  06/23/14 2124    Specimen Information:  Blood, Venipuncture Updated:  06/23/14 2300    Narrative:      1 BLUE+1 PURPLE    CULTURE BLOOD AEROBIC AND ANAEROBIC [[id number]] Collected:  06/23/14 2055    Specimen Information:  Blood, Venipuncture Updated:  06/23/14 2209    Narrative:      1 BLUE+1 PURPLE          Imaging, reviewed and are significant for:  Radiology Results (24 Hour)     ** No results found for the last 24 hours. **            Signed by: Lucillie Garfinkel, MD

## 2014-06-30 NOTE — Progress Notes (Signed)
Surgery Daily Progress Note  Surgery Team Plastic Surgery    Date/Time: 06/30/2014 3:42 PM  Bed:  F603/F603.01  Hospital Day: 8  4 Days Post-Op  Primary Procedure: none this admission    Interval History:   No acute issues overnight. Plan for OR tomorrow AM    Diet: Diet consistent carbohydrate  Diet NPO time specified     Active Lines, Drains and Airways     Name:   Placement date:   Placement time:   Site:   Days:    PICC Single Lumen 06/26/14 Right Basilic  06/26/14   1517   Basilic   4          Medications:     Current Facility-Administered Medications   Medication Dose Route Frequency   . cefepime  2 g Intravenous Q8H SCH   . enoxaparin  40 mg Subcutaneous Q24H   . gabapentin  300 mg Oral 6X Daily   . insulin glargine  86 Units Subcutaneous QAM   . metFORMIN  1,000 mg Oral BID Meals   . metroNIDAZOLE  500 mg Intravenous Q8H SCH   . simvastatin  40 mg Oral QHS   . sodium chloride (PF)  10 mL Intracatheter Daily   . vancomycin  2,000 mg Intravenous Q12H     . [START ON 07/01/2014] sodium chloride       dextrose, glucagon (rDNA), hydrALAZINE, HYDROmorphone, ibuprofen, insulin aspart, oxyCODONE, oxyCODONE     Physical Exam:   Current Vitals:   Filed Vitals:    06/30/14 1120   BP: 147/75   Pulse: 92   Temp: 97 F (36.1 C)   Resp: 16   SpO2: 97%     Vital signs in last 24 hours:Temp:  [95.8 F (35.4 C)-97.8 F (36.6 C)] 97 F (36.1 C)  Heart Rate:  [62-92] 92  Resp Rate:  [16-18] 16  BP: (119-147)/(61-78) 147/75 mmHg  Body mass index is 42.18 kg/(m^2).    Intake and Output Summary (Last 24 hours):  I/O last 3 completed shifts:  In: 1400 [P.O.:500; IV Piggyback:900]  Out: -     PHYSICAL EXAM:  General: AAOx3, NAD  Chest: CTAB  Heart:  RRR, no mrg  Abdomen: Healed LLQ incision, soft, nontender  Extremities:  Peripheral pulses intact, no edema  Neuro: CN 2-12 intact, gross motor intact, sensation grossly intact      Labs:     Hematology   No results for input(s): WBC, HGB, HCT, PLT in the last 72 hours.   Chemistry    No results for input(s): NA, K, CL, CO2, BUN, CREAT, GLU, CA, MG, PHOS in the last 72 hours.    Invalid input(s): CR, CREATININE   Coagulation   Recent Labs      06/30/14   0816   PT  14.2   PT INR  1.1      Liver Function Tests   No results for input(s): AST, ALT, ALKPHOS, PROT, ALB, BILITOTAL, BILIDIRECT, LIP, AMY, PREALB in the last 72 hours.     Cultures:  None     Rads:   Radiological Procedure reviewed.  No results found.      Assessment:   [name] Brown is a 48 y.o. male hx HTN, DM, brain abscess s/p craniotomy 09/2013 now w recurrent brain abscess    Plan:   NPO mn for OR tomorrow 8am  Continue abx per ID; currently vanc/cefepime/flagyl  Consent to be reviewed  PT/INR, T&S     M.  Everlena Cooper, MD  General Surgery PGY-2  Pager: [phone]

## 2014-07-01 ENCOUNTER — Inpatient Hospital Stay: Payer: BLUE CROSS/BLUE SHIELD | Admitting: Registered Nurse

## 2014-07-01 ENCOUNTER — Encounter
Admission: EM | Disposition: A | Discharge status: Home Health | Payer: Self-pay | Source: Home / Self Care | Attending: Hospitalist

## 2014-07-01 ENCOUNTER — Ambulatory Visit: Payer: Self-pay

## 2014-07-01 HISTORY — PX: RECONSTRUCTION, FREE FLAP MAJOR: SHX5038

## 2014-07-01 HISTORY — PX: ENDOSCOPIC SINUS SURGERY (FESS): SHX3848

## 2014-07-01 HISTORY — PX: CRANIOTOMY, REMOVAL TUMOR, NAVIGATION SYSTEM: SHX3522

## 2014-07-01 LAB — CVOR1 AGNKC GL THGB CLWBL
Arterial Total CO2: 20.1 mEq/L — ABNORMAL LOW (ref 24.0–30.0)
Base Excess, Arterial: -5 mEq/L — ABNORMAL LOW (ref <=2.0)
Calcium, Ionized: 2.1 mEq/L — ABNORMAL LOW (ref 2.30–2.58)
Chloride, WB: 105 mEq/L (ref 98–106)
HCO3, Arterial: 19.1 mEq/L — ABNORMAL LOW (ref 23.0–29.0)
Hematocrit Total Calculated: 40.4 % (ref 40.0–54.0)
Hemoglobin Total: 13.1 g/dL (ref 13.0–17.0)
O2 Sat, Arterial: 99.2 % (ref 95.0–100.0)
Temperature: 37
Whole Blood Glucose: 207 mg/dL — ABNORMAL HIGH (ref 70–100)
Whole Blood Potassium: 4.3 mEq/L (ref 3.5–5.3)
Whole Blood Sodium: 134 mEq/L — ABNORMAL LOW (ref 136–146)
pCO2, Arterial: 34.2 mmhg — ABNORMAL LOW (ref 35.0–45.0)
pH, Arterial: 7.366 (ref 7.350–7.450)
pO2, Arterial: 152 mmhg — ABNORMAL HIGH (ref 80.0–90.0)

## 2014-07-01 LAB — COMPREHENSIVE METABOLIC PANEL
ALT: 99 U/L — ABNORMAL HIGH (ref 0–55)
AST (SGOT): 73 U/L — ABNORMAL HIGH (ref 5–34)
Albumin/Globulin Ratio: 1.5 (ref 0.9–2.2)
Albumin: 3.9 g/dL (ref 3.5–5.0)
Alkaline Phosphatase: 51 U/L (ref 38–106)
BUN: 13 mg/dL (ref 9.0–28.0)
Bilirubin, Total: 0.5 mg/dL (ref 0.2–1.2)
CO2: 18 mEq/L — ABNORMAL LOW (ref 22–29)
Calcium: 7.8 mg/dL — ABNORMAL LOW (ref 8.5–10.5)
Chloride: 106 mEq/L (ref 100–111)
Creatinine: 0.9 mg/dL (ref 0.7–1.3)
Globulin: 2.6 g/dL (ref 2.0–3.6)
Glucose: 204 mg/dL — ABNORMAL HIGH (ref 70–100)
Potassium: 4.4 mEq/L (ref 3.5–5.1)
Protein, Total: 6.5 g/dL (ref 6.0–8.3)
Sodium: 135 mEq/L — ABNORMAL LOW (ref 136–145)

## 2014-07-01 LAB — CBC
Hematocrit: 36.7 % — ABNORMAL LOW (ref 42.0–52.0)
Hgb: 12.4 g/dL — ABNORMAL LOW (ref 13.0–17.0)
MCH: 30 pg (ref 28.0–32.0)
MCHC: 33.8 g/dL (ref 32.0–36.0)
MCV: 88.9 fL (ref 80.0–100.0)
MPV: 10 fL (ref 9.4–12.3)
Nucleated RBC: 0 /100 WBC (ref 0–1)
Platelets: 224 10*3/uL (ref 140–400)
RBC: 4.13 10*6/uL — ABNORMAL LOW (ref 4.70–6.00)
RDW: 13 % (ref 12–15)
WBC: 12.45 10*3/uL — ABNORMAL HIGH (ref 3.50–10.80)

## 2014-07-01 LAB — GLUCOSE WHOLE BLOOD - POCT
Whole Blood Glucose POCT: 132 mg/dL — ABNORMAL HIGH (ref 70–100)
Whole Blood Glucose POCT: 239 mg/dL — ABNORMAL HIGH (ref 70–100)
Whole Blood Glucose POCT: 244 mg/dL — ABNORMAL HIGH (ref 70–100)

## 2014-07-01 LAB — PERSONNEL HEALTH HCV ANTIBODY: Personel Health HCV Antibody: NONREACTIVE

## 2014-07-01 LAB — PERSONNEL HEALTH HBSAG: Personel Health HBS AG: NONREACTIVE

## 2014-07-01 LAB — HIV AG/AB 4TH GEN, SOURCE PATIENT: HIV Ag/Ab 4th Gen, Source patient: NONREACTIVE

## 2014-07-01 LAB — VANCOMYCIN, TROUGH: Vancomycin Trough: 12.3 ug/mL (ref 10.0–20.0)

## 2014-07-01 LAB — GFR: EGFR: >60

## 2014-07-01 SURGERY — CRANIOTOMY, EXCISION, INTRACRANIAL NEOPLASM, COMPUTER-ASSISTED NAVIGATION
Anesthesia: Anesthesia General | Site: Thigh | Laterality: Left | Wound class: Clean

## 2014-07-01 MED ORDER — HYDROMORPHONE HCL PF 1 MG/ML IJ SOLN
INTRAMUSCULAR | Status: AC
Start: 2014-07-01 — End: 2014-07-02
  Filled 2014-07-01: qty 1

## 2014-07-01 MED ORDER — PROMETHAZINE HCL 25 MG/ML IJ SOLN
6.2500 mg | Freq: Once | INTRAMUSCULAR | Status: DC | PRN
Start: 2014-07-01 — End: 2014-07-01

## 2014-07-01 MED ORDER — MIDAZOLAM HCL 2 MG/2ML IJ SOLN
INTRAMUSCULAR | Status: DC | PRN
Start: 2014-07-01 — End: 2014-07-01
  Administered 2014-07-01 (×2): 1 mg via INTRAVENOUS

## 2014-07-01 MED ORDER — FENTANYL CITRATE 0.05 MG/ML IJ SOLN
INTRAMUSCULAR | Status: AC
Start: 2014-07-01 — End: ?
  Filled 2014-07-01: qty 2

## 2014-07-01 MED ORDER — ASPIRIN 300 MG RE SUPP
300.0000 mg | Freq: Every day | RECTAL | Status: DC
Start: 2014-07-01 — End: 2014-07-02
  Administered 2014-07-01: 300 mg via RECTAL
  Filled 2014-07-01 (×2): qty 1

## 2014-07-01 MED ORDER — ROCURONIUM BROMIDE 50 MG/5ML IV SOLN
INTRAVENOUS | Status: AC
Start: 2014-07-01 — End: ?
  Filled 2014-07-01: qty 10

## 2014-07-01 MED ORDER — FAMOTIDINE 10 MG/ML IV SOLN (WRAP)
INTRAVENOUS | Status: DC | PRN
Start: 2014-07-01 — End: 2014-07-01
  Administered 2014-07-01: 20 mg via INTRAVENOUS

## 2014-07-01 MED ORDER — MEPERIDINE HCL 25 MG/ML IJ SOLN
12.5000 mg | INTRAMUSCULAR | Status: DC | PRN
Start: 2014-07-01 — End: 2014-07-01

## 2014-07-01 MED ORDER — ON-Q PUMP SINGLE FLOW
2.0000 mL/h | INTRAMUSCULAR | Status: DC
Start: 2014-07-01 — End: 2014-07-08
  Administered 2014-07-01: 2 mL/h via PERCUTANEOUS
  Filled 2014-07-01: qty 550

## 2014-07-01 MED ORDER — HYDROMORPHONE HCL PF 1 MG/ML IJ SOLN
0.5000 mg | INTRAMUSCULAR | Status: AC | PRN
Start: 2014-07-01 — End: 2014-07-01
  Administered 2014-07-01 (×4): 0.5 mg via INTRAVENOUS

## 2014-07-01 MED ORDER — DEXAMETHASONE SODIUM PHOSPHATE 4 MG/ML IJ SOLN (WRAP)
INTRAMUSCULAR | Status: DC | PRN
Start: 2014-07-01 — End: 2014-07-01
  Administered 2014-07-01: 10 mg via INTRAVENOUS
  Administered 2014-07-01: 4 mg via INTRAVENOUS

## 2014-07-01 MED ORDER — NALOXONE HCL 0.4 MG/ML IJ SOLN
0.1000 mg | INTRAMUSCULAR | Status: DC | PRN
Start: 2014-07-01 — End: 2014-07-02

## 2014-07-01 MED ORDER — OXYCODONE-ACETAMINOPHEN 5-325 MG PO TABS
1.0000 | ORAL_TABLET | Freq: Once | ORAL | Status: DC | PRN
Start: 2014-07-01 — End: 2014-07-01

## 2014-07-01 MED ORDER — NEOSTIGMINE METHYLSULFATE 1 MG/ML IJ SOLN
INTRAMUSCULAR | Status: DC | PRN
Start: 2014-07-01 — End: 2014-07-01
  Administered 2014-07-01 (×2): 1.5 mg via INTRAVENOUS

## 2014-07-01 MED ORDER — PAPAVERINE HCL 30 MG/ML IJ SOLN
INTRAMUSCULAR | Status: DC | PRN
Start: 2014-07-01 — End: 2014-07-01
  Administered 2014-07-01: 60 mg via INTRAMUSCULAR

## 2014-07-01 MED ORDER — LIDOCAINE HCL (PF) 2 % IJ SOLN
INTRAMUSCULAR | Status: AC
Start: 2014-07-01 — End: ?
  Filled 2014-07-01: qty 5

## 2014-07-01 MED ORDER — SODIUM CHLORIDE 0.9 % IV SOLN
INTRAVENOUS | Status: DC | PRN
Start: 2014-07-01 — End: 2014-07-01

## 2014-07-01 MED ORDER — VECURONIUM BROMIDE 10 MG IV SOLR
INTRAVENOUS | Status: DC | PRN
Start: 2014-07-01 — End: 2014-07-01
  Administered 2014-07-01: 3 mg via INTRAVENOUS
  Administered 2014-07-01: 1 mg via INTRAVENOUS
  Administered 2014-07-01: 2 mg via INTRAVENOUS

## 2014-07-01 MED ORDER — INSULIN ASPART 100 UNIT/ML SC SOLN
SUBCUTANEOUS | Status: AC
Start: 2014-07-01 — End: 2014-07-01
  Administered 2014-07-01: 2 [IU] via SUBCUTANEOUS
  Filled 2014-07-01: qty 1

## 2014-07-01 MED ORDER — HYDROMORPHONE HCL PF 1 MG/ML IJ SOLN
INTRAMUSCULAR | Status: AC
Start: 2014-07-01 — End: ?
  Filled 2014-07-01: qty 1

## 2014-07-01 MED ORDER — PROPOFOL INFUSION 10 MG/ML
INTRAVENOUS | Status: DC | PRN
Start: 2014-07-01 — End: 2014-07-01
  Administered 2014-07-01: 100 mg via INTRAVENOUS
  Administered 2014-07-01: 200 mg via INTRAVENOUS
  Administered 2014-07-01: 100 mg via INTRAVENOUS
  Administered 2014-07-01: 40 mg via INTRAVENOUS

## 2014-07-01 MED ORDER — LIDOCAINE-EPINEPHRINE 1 %-1:100000 IJ SOLN
INTRAMUSCULAR | Status: DC | PRN
Start: 2014-07-01 — End: 2014-07-01
  Administered 2014-07-01: 20 mL

## 2014-07-01 MED ORDER — GLYCOPYRROLATE 0.2 MG/ML IJ SOLN
INTRAMUSCULAR | Status: AC
Start: 2014-07-01 — End: ?
  Filled 2014-07-01: qty 2

## 2014-07-01 MED ORDER — ONDANSETRON HCL 4 MG/2ML IJ SOLN
4.0000 mg | Freq: Once | INTRAMUSCULAR | Status: DC | PRN
Start: 2014-07-01 — End: 2014-07-01

## 2014-07-01 MED ORDER — ALBUMIN HUMAN 5 % IV SOLN
INTRAVENOUS | Status: DC | PRN
Start: 2014-07-01 — End: 2014-07-01

## 2014-07-01 MED ORDER — DEXAMETHASONE SODIUM PHOSPHATE 20 MG/5ML IJ SOLN
INTRAMUSCULAR | Status: AC
Start: 2014-07-01 — End: ?
  Filled 2014-07-01: qty 5

## 2014-07-01 MED ORDER — GLYCOPYRROLATE 0.2 MG/ML IJ SOLN
INTRAMUSCULAR | Status: DC | PRN
Start: 2014-07-01 — End: 2014-07-01
  Administered 2014-07-01: .2 mg via INTRAVENOUS
  Administered 2014-07-01: 0.4 mg via INTRAVENOUS

## 2014-07-01 MED ORDER — LACTATED RINGERS IV SOLN
INTRAVENOUS | Status: DC | PRN
Start: 2014-07-01 — End: 2014-07-01

## 2014-07-01 MED ORDER — HEPARIN SODIUM (PORCINE) 5000 UNIT/ML IJ SOLN
INTRAMUSCULAR | Status: DC | PRN
Start: 2014-07-01 — End: 2014-07-01
  Administered 2014-07-01: 5000 [IU] via SUBCUTANEOUS

## 2014-07-01 MED ORDER — LACTATED RINGERS IV SOLN
INTRAVENOUS | Status: DC
Start: 2014-07-01 — End: 2014-07-03

## 2014-07-01 MED ORDER — SODIUM CHLORIDE 0.9 % IR SOLN
Status: DC | PRN
Start: 2014-07-01 — End: 2014-07-01
  Administered 2014-07-01: 1000 mL

## 2014-07-01 MED ORDER — GLYCOPYRROLATE 0.2 MG/ML IJ SOLN
INTRAMUSCULAR | Status: AC
Start: 2014-07-01 — End: ?
  Filled 2014-07-01: qty 1

## 2014-07-01 MED ORDER — MIDAZOLAM HCL 2 MG/2ML IJ SOLN
INTRAMUSCULAR | Status: AC
Start: 2014-07-01 — End: ?
  Filled 2014-07-01: qty 2

## 2014-07-01 MED ORDER — FENTANYL CITRATE 0.05 MG/ML IJ SOLN
INTRAMUSCULAR | Status: AC
Start: 2014-07-01 — End: 2014-07-01
  Administered 2014-07-01: 25 ug via INTRAVENOUS
  Filled 2014-07-01: qty 2

## 2014-07-01 MED ORDER — MICROFIBRILLAR COLL HEMOSTAT EX PADS
MEDICATED_PAD | CUTANEOUS | Status: DC | PRN
Start: 2014-07-01 — End: 2014-07-01
  Administered 2014-07-01: 1 via TOPICAL

## 2014-07-01 MED ORDER — SODIUM CHLORIDE BACTERIOSTATIC 0.9 % IJ SOLN
INTRAMUSCULAR | Status: DC | PRN
Start: 2014-07-01 — End: 2014-07-01
  Administered 2014-07-01: 500 mL

## 2014-07-01 MED ORDER — ONDANSETRON HCL 4 MG/2ML IJ SOLN
INTRAMUSCULAR | Status: DC | PRN
Start: 2014-07-01 — End: 2014-07-01
  Administered 2014-07-01: 4 mg via INTRAVENOUS

## 2014-07-01 MED ORDER — ROCURONIUM BROMIDE 50 MG/5ML IV SOLN
INTRAVENOUS | Status: DC | PRN
Start: 2014-07-01 — End: 2014-07-01
  Administered 2014-07-01: 30 mg via INTRAVENOUS
  Administered 2014-07-01: 50 mg via INTRAVENOUS
  Administered 2014-07-01: 70 mg via INTRAVENOUS

## 2014-07-01 MED ORDER — SODIUM CHLORIDE 0.9 % IV SOLN
500.0000 mg | INTRAVENOUS | Status: DC | PRN
Start: 2014-07-01 — End: 2014-07-01
  Administered 2014-07-01: 500 mg via INTRAVENOUS

## 2014-07-01 MED ORDER — THROMBIN 5000 UNITS EX SOLR
CUTANEOUS | Status: DC | PRN
Start: 2014-07-01 — End: 2014-07-01
  Administered 2014-07-01: 10000 [IU] via TOPICAL

## 2014-07-01 MED ORDER — PROPOFOL 10 MG/ML IV EMUL
INTRAVENOUS | Status: AC
Start: 2014-07-01 — End: ?
  Filled 2014-07-01: qty 20

## 2014-07-01 MED ORDER — LIDOCAINE HCL 2 % IJ SOLN
INTRAMUSCULAR | Status: DC | PRN
Start: 2014-07-01 — End: 2014-07-01

## 2014-07-01 MED ORDER — GELATIN ABSORBABLE 100 EX MISC
CUTANEOUS | Status: DC | PRN
Start: 2014-07-01 — End: 2014-07-01
  Administered 2014-07-01: 1 via TOPICAL

## 2014-07-01 MED ORDER — PHENYLEPHRINE 10 MCG/ML IV BOLUS (ANESTHESIA)
INTRAVENOUS | Status: DC | PRN
Start: 2014-07-01 — End: 2014-07-01
  Administered 2014-07-01 (×2): 50 ug via INTRAVENOUS
  Administered 2014-07-01: 100 ug via INTRAVENOUS

## 2014-07-01 MED ORDER — HYDROMORPHONE HCL PF 1 MG/ML IJ SOLN
INTRAMUSCULAR | Status: DC | PRN
Start: 2014-07-01 — End: 2014-07-01
  Administered 2014-07-01: .25 mg via INTRAVENOUS
  Administered 2014-07-01: 0.5 mg via INTRAVENOUS
  Administered 2014-07-01: .25 mg via INTRAVENOUS

## 2014-07-01 MED ORDER — ONDANSETRON HCL 4 MG/2ML IJ SOLN
INTRAMUSCULAR | Status: AC
Start: 2014-07-01 — End: ?
  Filled 2014-07-01: qty 2

## 2014-07-01 MED ORDER — MANNITOL 20 % IV SOLN
INTRAVENOUS | Status: DC | PRN
Start: 2014-07-01 — End: 2014-07-01
  Administered 2014-07-01 (×2): 50 g via INTRAVENOUS
  Administered 2014-07-01: 34 g via INTRAVENOUS

## 2014-07-01 MED ORDER — LIDOCAINE HCL 2 % IJ SOLN
INTRAMUSCULAR | Status: DC | PRN
Start: 2014-07-01 — End: 2014-07-01
  Administered 2014-07-01: 80 mg

## 2014-07-01 MED ORDER — FAMOTIDINE 20 MG/2ML IV SOLN
INTRAVENOUS | Status: AC
Start: 2014-07-01 — End: ?
  Filled 2014-07-01: qty 2

## 2014-07-01 MED ORDER — HYDROMORPHONE PCA 0.2 MG/ML 100 ML (OUTSOURCED)
INTRAVENOUS | Status: DC
Start: 2014-07-01 — End: 2014-07-02
  Administered 2014-07-01: 20 mg via INTRAVENOUS
  Filled 2014-07-01: qty 100

## 2014-07-01 MED ORDER — ROCURONIUM BROMIDE 50 MG/5ML IV SOLN
INTRAVENOUS | Status: AC
Start: 2014-07-01 — End: ?
  Filled 2014-07-01: qty 5

## 2014-07-01 MED ORDER — FENTANYL CITRATE 0.05 MG/ML IJ SOLN
INTRAMUSCULAR | Status: DC | PRN
Start: 2014-07-01 — End: 2014-07-01
  Administered 2014-07-01 (×2): 50 ug via INTRAVENOUS
  Administered 2014-07-01: 25 ug via INTRAVENOUS
  Administered 2014-07-01: 100 ug via INTRAVENOUS
  Administered 2014-07-01 (×2): 25 ug via INTRAVENOUS
  Administered 2014-07-01: 50 ug via INTRAVENOUS
  Administered 2014-07-01: 100 ug via INTRAVENOUS
  Administered 2014-07-01: 25 ug via INTRAVENOUS
  Administered 2014-07-01: 50 ug via INTRAVENOUS

## 2014-07-01 MED ORDER — FENTANYL CITRATE 0.05 MG/ML IJ SOLN
25.0000 ug | INTRAMUSCULAR | Status: AC | PRN
Start: 2014-07-01 — End: 2014-07-01
  Administered 2014-07-01 (×3): 25 ug via INTRAVENOUS

## 2014-07-01 SURGICAL SUPPLY — 203 items
APPLIER IN CLP MED LGCLP 9.3IN LF STRL (Clips) ×2
APPLIER IN CLP SM LGCLP 9.3IN LF STRL 20 (Clips) ×1
APPLIER INTERNAL CLIP MEDIUM L9.3 IN 20 (Clips) ×6
APPLIER INTERNAL CLIP MEDIUM L9.3 IN 20 MULTIPLE OPEN LIGATE LIGACLIP (Clips) ×6 IMPLANT
APPLIER INTERNAL CLIP SMALL L9.3 IN 20 (Clips) ×3
APPLIER INTERNAL CLIP SMALL L9.3 IN 20 MULTIPLE OPEN LIGATE LIGACLIP (Clips) ×3 IMPLANT
BLADE CLIPPER SPECIALTY (Procedure Accessories) ×4 IMPLANT
BLADE SHAVER OD2 MM M4 ROTATABLE (Blade) ×3
BLADE SHAVER OD2 MM M4 ROTATABLE XPS INFERIOR TURBINATE (Blade) ×3 IMPLANT
BLADE SHAVER OD4 MM ROTATABLE OFFSET CUT (ENT Supplies) ×3
BLADE SHAVER OD4.3 MM L13 CM QUADCUT (Blade) ×3 IMPLANT
BLADE SHAVER RAD 40 L11 CM OD4 MM ROTATABLE OFFSET CUT SURFACE (ENT Supplies) ×3 IMPLANT
BLADE SHVR 2MM LF STRL M4 ROT INFR TRBNT (Blade) ×1
BLADE SHVR 360D 5K RPM RAD 40 4MM 11CM (ENT Supplies) ×1
BLADE SHVR QUADCUT 4.3MM 13CM (Blade) ×1
CATHETER IV JELCO 14GA 2IN STRL RADOPQ (IV Supply) ×1
CATHETER IV OD14 GA L2 IN RADIOPAQUE (IV Supply) ×3
CATHETER IV OD14 GA L2 IN RADIOPAQUE JELCO (IV Supply) ×3 IMPLANT
CLIP INTERNAL MICRO CARTRIDGE GEM (Ortho Supply)
CLIP INTERNAL MICRO CARTRIDGE GEM TITANIUM HEMOSTATIC (Ortho Supply) IMPLANT
CLIP INTNL TI MIC GM CRTDG HMST (Ortho Supply)
CLOSURE STERI-STRIP 1X5IN (Dressing) ×4 IMPLANT
COAGULATOR SCT VLAB 8FR 6IN LF STRL (Cautery) ×1
COAGULATOR SUCTION L6 IN HANDSWITCH CORD (Cautery) ×3
COAGULATOR SUCTION L6 IN HANDSWITCH CORD THERMAL REDUCTION FLUID (Cautery) ×3 IMPLANT
CONTAINER SPEC 8OZ NS SNPON LID TRNLU (Suction) ×12 IMPLANT
CONTAINER SPEC LLDPE 16OZ W LF NS LEK (Procedure Accessories) ×8
CONTAINER SPECIMEN C16 OZ WIDE LEAK SHATTER RESISTANT SNAP ON LID (Procedure Accessories) ×6 IMPLANT
CORD ELECTROSURGICAL GREEN 12FT PLASTIC BIPOLAR STERILE DISPOSABLE (Cautery) ×3 IMPLANT
CORD ESURG PLS 12FT STRL BP DISP GRN (Cautery) ×4
COTTONOID 1/2X1" 80-1402" (Dressing) IMPLANT
COUPLER ANASTOMOSIS RING PIN PROTECTIVE (Coupler) ×3 IMPLANT
COUPLER ANASTOMOSIS RING PIN PROTECTIVE COVER JAW ASSEMBLY OD3 MM GEM (Coupler) ×3 IMPLANT
COUPLER ANSTM SS PE GM 3MM LF STRL RING (Coupler) ×1 IMPLANT
COVER BUR HL TI MATRIXNEURO 17MM .4MM NS (Cover) ×1 IMPLANT
COVER BURR HOLE CRANIOMAXILLOFACIAL H.4 (Cover) ×3 IMPLANT
COVER BURR HOLE CRANIOMAXILLOFACIAL H.4 MM OD17 MM TITANIUM BLUE (Cover) ×3 IMPLANT
COVER HNDL LIGHT SINGLES (Procedure Accessories) ×8 IMPLANT
COVER HVYDTY BLK 65X90IN (Drape) ×4 IMPLANT
CTTND 1/2X1/2" (Sponge) IMPLANT
DECANTER FLD LF STRL TRNSF DEV DISP (IV Supply) ×4
DECANTER FLUID TRANSFER DEVICE DISPOSABLE STERILE LATEX FREE (IV Supply) ×3 IMPLANT
DRAIN 4 FREE FLOW CHANNEL RADIOPAQUE (Drain)
DRAIN 4 FREE FLOW CHANNEL RADIOPAQUE BARD L1/4 IN INCISION SILICONE (Drain) IMPLANT
DRAIN INCS SIL FULL FLUT FLT BARD .25IN (Drain)
DRAIN INCS SIL FULL FLUT RND 15FR 3/16IN (Drain) ×2
DRAIN OD15 FR RADIOPAQUE 4 FREE FLOW (Drain) ×6
DRAIN OD15 FR RADIOPAQUE 4 FREE FLOW CHANNEL TROCAR BLAKE L3/16 IN (Drain) ×6 IMPLANT
DRAPE EQP OPM 48X118IN LF STRL DISP (Drape)
DRAPE EQUIPMENT W48 IN X H118 IN OPMI (Drape)
DRAPE INCISE IOBAN-2 60X35CM (Drape) ×4 IMPLANT
DRAPE MICROSCOPE TYPE 25 OPMI VISIONGUARD 48 X 118 IN (Drape) IMPLANT
DRAPE WARMER (Drape) ×4 IMPLANT
DRESSING BIO BVN CLGN GAG POLYSILOXANE (Allograft) ×25 IMPLANT
DRESSING BIOLOGICAL INTEGRA L2 IN X W2 (Allograft) ×75 IMPLANT
DRESSING BIOLOGICAL L2 IN X W2 IN BILAYER MATRIX BOVINE COLLAGEN (Allograft) ×75 IMPLANT
DRESSING PETRO 3% BI 3BRM GZE XR 8X1IN (Dressing) ×2
DRESSING PETROLATUM XEROFORM L8 IN X W1 (Dressing) ×6
DRESSING PETROLATUM XEROFORM L8 IN X W1 IN 3% BISMUTH TRIBROMOPHENATE (Dressing) ×6 IMPLANT
DRESSING SURG PATTY .5X3IN (Sponge) IMPLANT
DRESSING TELFA 3X8IN STERILE (Dressing) IMPLANT
ELECTRODE BLADE (Cautery) ×12 IMPLANT
EVACUATOR WOUND SILICONE 100CC (Drain) ×4 IMPLANT
GAUZE KERLIX 4.5X4YDS (Dressing) IMPLANT
GLOVE SRG NTR RBR 8 INDCTR BGL 299X103MM (Glove) ×2
GLOVE SURG BIOGEL SKNSNS SZ6 (Glove) ×8 IMPLANT
GLOVE SURG BIOGEL SZ7.5 (Glove) ×8 IMPLANT
GLOVE SURG LTX SZ 8.5 STRL (Glove) ×4 IMPLANT
GLOVE SURGICAL 8 INDICATOR BIOGEL POWDER (Glove) ×6
GLOVE SURGICAL 8 INDICATOR BIOGEL POWDER FREE SMOOTH BEAD CUFF (Glove) ×6 IMPLANT
GOWN X-LRG POLY REINFORCED (Gown) ×8 IMPLANT
GRAFT DURAGEN PLUS 3X3IN (Graft) ×1 IMPLANT
HEMOSTAT SURGICEL FIBR ABS 1X2 (Hemostat) ×4 IMPLANT
HOOK RETRACTION L12 MM LONE STAR (Procedure Accessories) ×3
HOOK RETRACTION L12 MM LONE STAR STAINLESS STEEL 12 MM BLUNT ELASTIC (Procedure Accessories) ×3 IMPLANT
HOOK RETRACTION W1/2 IN DERMAHOOK FISH (Procedure Accessories) ×3
HOOK RETRACTION W1/2 IN DERMAHOOK FISH ELASTIC (Procedure Accessories) ×3 IMPLANT
HOOK RTRCT DERMAHOOK .5IN LF STRL FISH (Procedure Accessories) ×1
HRST DONUT HL-IN-ONE (Positioning Supplies) ×4 IMPLANT
KIT CRANIOTOMY FFX (Tray) ×4 IMPLANT
LOOP VESSEL RED MINI (External Fixator) ×4 IMPLANT
MAGAZINE CLIP RANEY CRANIAL (Clips) ×3 IMPLANT
MAGAZINE CLIP RANEY CRNL (Clips) ×1
MARKER SKIN (Positioning Supplies) ×8 IMPLANT
MATRIX INTEGRA BMWD 4X5IN (Graft) ×500 IMPLANT
MATRIX TISSUE DURAGEN DURAPLASTY L3 IN X (Graft) ×3 IMPLANT
MATRIX TISSUE DURAGEN PLUS DP1033 L3 IN X W3 IN PATCH REGENERATION (Graft) ×3 IMPLANT
NEEDLE BLNT AL SS STD MNJCT 18GA 1IN LF (Needles) ×2
NEEDLE L1 IN OD18 GA ALUMINUM STAINLESS (Needles) ×6
NEEDLE L1 IN OD18 GA ALUMINUM STAINLESS STEEL LUER LOCK HUB BLUNT (Needles) ×6 IMPLANT
NEEDLE REG BEVEL 19GX1.5IN (Needles) ×20 IMPLANT
PACK MAJOR PLASTIC (Pack) ×4 IMPLANT
PAD ABD PVC CRTY 9X5IN LF STRL 3 LYR (Dressing) ×24 IMPLANT
PAD ADHESIVE ENT XOMED (Dressing) ×4 IMPLANT
PAD ARMBOARD 20X8X2IN (Procedure Accessories) ×8 IMPLANT
PAD ELECTROSRG GRND REM W CRD (Procedure Accessories) ×12 IMPLANT
PATTIES 1X3 USE LAWSON 2442 (Sponge) IMPLANT
PIN SKULL ADULT SZ (Neuro Supply) ×4 IMPLANT
PLATE BN TI 2Y MATRIXNEURO 21MMX.4MM 6 (Plate) ×1 IMPLANT
PLATE BN TI BX MATRIXNEURO 10X16X.4MM NS (Plate) ×1 IMPLANT
PLATE BN TI STRG MATRIXNEURO 12MMX.4MM (Plate) ×1 IMPLANT
PLATE L10 MM X W16 MM X H.4 MM BOX (Plate) ×3 IMPLANT
PLATE L10 MM X W16 MM X H.4 MM BOX CRANIAL 4 HOLE RIGID TITANIUM BONE (Plate) ×3 IMPLANT
PLATE L12 MM X H.4 MM STRAIGHT CRANIAL 2 (Plate) ×3 IMPLANT
PLATE L12 MM X H.4 MM STRAIGHT CRANIAL 2 HOLE CENTER SPACE RIGID (Plate) ×3 IMPLANT
PLATE L21 MM X H.4 MM 2Y CRANIAL 6 HOLE (Plate) ×3 IMPLANT
PLATE L21 MM X H.4 MM 2Y CRANIAL 6 HOLE LOW PROFILE TITANIUM BONE BLUE (Plate) ×3 IMPLANT
PUMP ON-Q W/SOAKR 400X4ML (Pain Med Pump) ×4 IMPLANT
SCREW BN TI MATRIXNEURO 1.5MM 2.5MM 4MM (Screw) ×17 IMPLANT
SCREW L4 MM OD1.5 MM ODSEC2.5 MM (Screw) ×51 IMPLANT
SCREW L4 MM OD1.5 MM ODSEC2.5 MM TITANIUM CRANIOMAXILLOFACIAL SELF (Screw) ×51 IMPLANT
SEALANT DURA PG TRILYSINE AMINE 5ML (Other) ×1
SEALANT DURAL DURASEAL PEG TRILYSINE (Other) ×3
SEALANT DURAL DURASEAL PEG TRILYSINE AMINE 5 ML (Other) ×3 IMPLANT
SLEEVE CMPR MED KN LGTH KDL SCD 21- IN (Sleeve) ×8
SLEEVE COMPRESSION MEDIUM KNEE LENGTH KENDALL SEQUENTIAL OD21- IN (Sleeve) ×6 IMPLANT
SLEEVE SRG PLSTR CLU 3-5.5IN 21.5IN LF (Drape) ×1
SLEEVE SURGICAL L21.5 IN ELASTIC END (Drape) ×3
SLEEVE SURGICAL L21.5 IN ELASTIC END NONWOVEN OD3-5.5 IN CONVERTORS (Drape) ×3 IMPLANT
SOL ALCOHOL ISOPROPYL 70% 4 OZ (Prep) ×4 IMPLANT
SOLUTION IRR 0.9% NACL 1000ML LF STRL (Irrigation Solutions) ×2
SOLUTION IRR 0.9% NACL 250ML LF STRL PLS (IV Solutions) ×1
SOLUTION IRRIGATION 0.9% SODIUM CHLORIDE (IV Solutions) ×3
SOLUTION IRRIGATION 0.9% SODIUM CHLORIDE (Irrigation Solutions) ×6
SOLUTION IRRIGATION 0.9% SODIUM CHLORIDE 1000 ML PLASTIC POUR BOTTLE (Irrigation Solutions) ×6 IMPLANT
SOLUTION IRRIGATION 0.9% SODIUM CHLORIDE 250 ML PLASTIC POUR BOTTLE (IV Solutions) ×3 IMPLANT
SOLUTION PREP BETADINE 10% PVP IODINE 8OZ BOTTLE SKIN (Prep) ×3 IMPLANT
SOLUTION PRP 10% PVP IOD 8OZ BDINE BTL (Prep) ×4
SOLUTION SRGPRP 74% ISPRP 0.7% IOD (Prep) ×2
SOLUTION SURGICAL PREP 26 ML DURAPREP (Prep) ×6
SOLUTION SURGICAL PREP 26 ML DURAPREP 74% ISOPROPYL ALCOHOL 0.7% (Prep) ×6 IMPLANT
SPEAR EYE ABSORBENT TRIANGLE HEAD (Sponge)
SPEAR EYE ABSORBENT TRIANGLE HEAD MALLEABLE HANDLE WECK-CEL CELLULOSE (Sponge) IMPLANT
SPEAR EYE CLU PP WKCL LF STRL ABS 3ANG (Sponge)
SPLINT DOYLE II INTRANASAL (Splint) ×4 IMPLANT
SPLINT NASAL L3.5 CM X H2 CM THK.6 CM (Sponge) ×3
SPLINT NASAL L3.5 CM X H2 CM THK.6 CM KENNEDY SEPTUM COMPRESSED (Sponge) ×3 IMPLANT
SPLINT NSL THK.6CM KNDY 3.5CMX2CM LF (Sponge) ×1
SPNG ABSORBABLE GELATIN (Hemostat) ×4 IMPLANT
SPONGE GAUZE 12PLY STRL 4X4IN (Sponge) ×8 IMPLANT
SPONGE LAP 18X18IN PREWASH WHT (Sponge) ×3
SPONGE LAPAROTOMY L18 IN X W18 IN (Sponge) ×9
SPONGE LAPAROTOMY L18 IN X W18 IN PREWASH WHITE (Sponge) ×9 IMPLANT
SPONGE NEURO/ORTHO TELFA 1/2X3 (Sponge) ×4 IMPLANT
SPONGE NEURO/ORTHO TELFA 1X3 (Sponge) IMPLANT
SPONGE SRG RYN TELFA 3X.25IN LF STRL ABS (Sponge)
SPONGE SRG VISTEC 8X4IN LF STRL 12 PLY (Sponge) ×1
SPONGE SURGICAL L3 IN X W.25 IN (Sponge)
SPONGE SURGICAL L3 IN X W.25 IN ABSORBENT NONADHERENT STRIP RAYON (Sponge) IMPLANT
SPONGE SURGICAL L8 IN X W4 IN 12 PLY (Sponge) ×3
SPONGE SURGICAL L8 IN X W4 IN 12 PLY RADIOPAQUE BAND VISTEC BLUE WHITE (Sponge) ×3 IMPLANT
STAPLER SKIN L3.9 MM X W6.9 MM WIDE 35 (Skin Closure) ×6
STAPLER SKIN L3.9 MM X W6.9 MM WIDE 35 COUNT FIX HEAD RATCHET (Skin Closure) ×6 IMPLANT
STAPLER SKN SS W PRX PX .58MM 3.9X6.9MM (Skin Closure) ×2
STAYS SURG (Procedure Accessories) ×1
STRIP SKIN CLOSURE L4 IN X W1/2 IN (Dressing) ×3
STRIP SKIN CLOSURE L4 IN X W1/2 IN REINFORCE STERI-STRIP POLYESTER (Dressing) ×3 IMPLANT
STRIP SKNCLS PLSTR STRSTRP 4X.5IN LF (Dressing) ×1
SUTURE ABS CR 4-0 P-3 MTPS 18IN MFL BRN (Suture) ×2
SUTURE ABS CR 4-0 RB1 27IN MFL BRN (Suture) ×1
SUTURE ABS PLN 4-0 SC-1 18IN 2 ARM MFL (Suture) ×2
SUTURE CHROMIC GUT CHROMIC 4-0 P-3 L18 (Suture) ×6 IMPLANT
SUTURE CHROMIC GUT CHROMIC 4-0 RB-1 L27 (Suture) ×3
SUTURE CHROMIC GUT CHROMIC 4-0 RB-1 L27 IN MONOFILAMENT BROWN (Suture) ×3 IMPLANT
SUTURE ETHILON BLACK 3-0 PS-1 L18 IN (Suture) ×3
SUTURE ETHILON BLACK 3-0 PS-1 L18 IN MONOFILAMENT NONABSORBABLE (Suture) ×3 IMPLANT
SUTURE NABSB 3-0 PS1 ETH MTPS 18IN MFL (Suture) ×1
SUTURE NUROLON 4-0 RB1 8X18IN (Suture) ×4 IMPLANT
SUTURE PLAIN GUT PLAIN 4-0 SC-1 L18 IN 2 (Suture) ×6
SUTURE PLAIN GUT PLAIN 4-0 SC-1 L18 IN 2 ARM MONOFILAMENT YELLOWISH (Suture) ×6 IMPLANT
SUTURE VICRYL 2-0 CP2 8X18IN (Suture) ×4 IMPLANT
SYRINGE 20 ML BD LUER-LOK MEDICAL (Syringes, Needles) ×6 IMPLANT
SYRINGE 3ML LL (Syringes, Needles) ×8 IMPLANT
SYRINGE BULB 60CC LATEX FREE (Syringes, Needles) ×4 IMPLANT
SYRINGE LUER LOCK 10CC (Syringes, Needles) ×24 IMPLANT
SYRINGE MED 20ML LL LF STRL (Syringes, Needles) ×8
TAPE SURGICAL DURAPORE 3IN (Tape) ×4 IMPLANT
TOOL DISSECTING L8 CM SPIRAL TAPER FOOT (Burr) ×3
TOOL DISSECTING L8 CM SPIRAL TAPER FOOT ATTACHMENT OD2.3 MM MIDAS REX (Burr) ×3 IMPLANT
TOOL DISSECTING L8 CM TAPER FLUTE OD1.1 (Burr)
TOOL DISSECTING L8 CM TAPER FLUTE OD1.1 MM MIDAS REX LEGEND L6.4 MM (Burr) IMPLANT
TOOL DISSECTING L9 CM ACORN FLUTE OD7.5 (Burr) ×3
TOOL DISSECTING L9 CM ACORN FLUTE OD7.5 MM MIDAS REX LEGEND (Burr) ×3 IMPLANT
TOOL DSCT ACRN MDSRX LGND 7.5MM 9CM STRL (Burr) ×1
TOOL DSCT SPRL TPR MDSRX LGND 2.3MM 8CM (Burr) ×1
TOOL DSCT TPR MDSRX LGND 1.1MM 8CM FLUT (Burr)
TOWEL STERILE 6-PACK (Procedure Accessories) ×4 IMPLANT
TOWEL STERILE REUSABLE 8PK (Procedure Accessories) ×8 IMPLANT
TRACKER NAVIGATION FUSION AXIEM PVC ENT (Procedure Accessories) ×3 IMPLANT
TRACKER NAVIGATION INSTRUMENT FUSION AXIEM ENT (Procedure Accessories) ×6 IMPLANT
TRACKER NVG FSN AXIEM LF STRL INST ENT (Procedure Accessories) ×8
TRACKER NVG PVC FSN AXIEM LF STRL ENT (Procedure Accessories) ×4
TRAY CATH UMETER SLOCK 16FR (Tray) ×4 IMPLANT
TRAY NASAL/ORAL/H (Tray) ×4 IMPLANT
TRAY SKIN SCRUB L8 IN 6 WING 6 SPONGE STICK 2 TIP APPLICATOR DRY VINYL (Prep) ×12 IMPLANT
TRAY SKIN SCRUB MEDLINE L8 IN VINYL (Prep) ×12
TRAY SKN SCRB VNYL CTTN 8IN LF STRL 6 (Prep) ×4
TUBING SCT PVC ARG 3/16IN 10FT LF STRL (Tubing) ×1
TUBING SCT PVC ARG 3/16IN 6FT LF STRL (Tubing) ×1
TUBING SUCTION ID3/16 IN L10 FT (Tubing) ×3
TUBING SUCTION ID3/16 IN L10 FT NONCONDUCTIVE STRAIGHT MALE FEMALE (Tubing) ×3 IMPLANT
TUBING SUCTION ID3/16 IN L6 FT (Tubing) ×3
TUBING SUCTION ID3/16 IN L6 FT NONCONDUCTIVE STRAIGHT MALE FEMALE MOLD (Tubing) ×3 IMPLANT

## 2014-07-01 NOTE — Progress Notes (Signed)
Report given to OR.

## 2014-07-01 NOTE — Plan of Care (Signed)
Pt could not see today, since he went to OR.

## 2014-07-01 NOTE — Plan of Care (Signed)
Problem: Health Promotion  Goal: Knowledge - disease process  Extent of understanding conveyed about a specific disease process.   Outcome: Progressing  Pt scheduled for OR this a.m, NPO after midnight and IVF started. Vanco trough WNL and dose administered. Spouse at bedside.

## 2014-07-01 NOTE — Transfer of Care (Signed)
Anesthesia Transfer of Care Note    Patient: [name] Brown    Procedures performed: Procedure(s) with comments:  CRANIOTOMY, REMOVAL TUMOR - RE-EXPLOR BI-FRONTAL CRANIOTOMY, ABSCESS RESECTION     NO BRAIN LAB  ENDOSCOPIC SINUS sphenoidotomy with navigation - Scope  RECONSTRUCTION, FREE FLAP MAJOR - FREE FLAP MUSCLE    Anesthesia type: General ETT    Patient location:PACU    Last vitals:   Filed Vitals:    07/01/14 0717   BP: 135/72   Pulse: 78   Temp: 35.5 C (95.9 F)   Resp: 16   SpO2: 94%       Post pain: pain treated.      Mental Status:awake and alert     Respiratory Function: tolerating face mask    Cardiovascular: stable    Nausea/Vomiting: patient not complaining of nausea or vomiting    Hydration Status: adequate    Post assessment: no apparent anesthetic complications, no reportable events and no evidence of recall. Pt awake, responsive, following commands. Pain medication given IV. Temp 98.3. fsbg 239 in pacu. On Q pump attached by surgical staff

## 2014-07-01 NOTE — Op Note (Signed)
Procedure Date: 07/01/2014     Patient Type: I     SURGEON: Dorinda Hill MD  ASSISTANT:       PREOPERATIVE DIAGNOSES:  1.  Chronic ethmoid sinusitis.  2.  Chronic sphenoid sinusitis.     POSTOPERATIVE DIAGNOSES:  1.  Chronic ethmoid sinusitis.  2.  Chronic sphenoid sinusitis.     TITLE OF PROCEDURE:  Bilateral endoscopic revision sphenoidotomy.     ANESTHESIA:  General.     INDICATIONS:  This 48 year old gentleman has longstanding sinus disease and headaches.   He has had multiple surgeries of his sinuses in the years past.  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  He has returned to the hospital  with inflamed lesion associated with the right mesofrontal lobe area.  The  concern is that the patient has rhinogenic source leading to the recurrence  of abscess intracranially.  Preoperatively, MRI scan and CT scan navigation  was obtained.  The CT scan shows chronic osteitic appearance of sinus bones,  but the frontal sinus is intact and without obvious communication to the  anterior fossa.  There is a defect in the right ethmoid roof.  This  potentially could be the source of the problem.  There is also soft tissue  filling the sphenoid sinuses bilaterally.  This will be examined  intraoperatively today and debrided.     DESCRIPTION OF PROCEDURE:  The patient was brought to the operating room and placed supine on the  table.  He underwent induction of general anesthesia and orotracheal  intubation.  Anesthesia service placed their lines.  After consultation  with Dr. Jaynie Collins of neurosurgery and also Dr. Nicolette Bang and Dr. Cherly Hensen of plastic  surgery.  I will perform my endoscopic surgery first.  Afterwards, I will  be on standby after craniotomy in order to better examine the ethmoid  sinus.     Next, the CT scan was in the operating room, reviewed directly.  The  patient was prepped and draped in the usual fashion.  Within the nasal  cavity and  extubated sinus cavity there was inflamed mucosa diffusely.   There was also crusting and eschar debrided from the surfaces.  There was  missing septum.  He had minimal remnants of turbinates.  These were  inferior turbinates.  Next, with a 0-degree telescope, the sphenoid sinus  was examined.  There were essentially landmarks just at the nasopharynx and  drainage emanating from the ostium.  The Frazier suction was placed in the  cavity and mucoid output was cleared.  The mucosa was edematous and  inflamed, but there was no fungus impaction.  The right sphenoid cavity was  examined, and it was much a smaller cavity, and there was minimal debris  within that sinus on the left.  This was cleared.  The right sphenoid sinus  was examined again.  Then I also examined the right ethmoid area.  The  landmarks were quite difficult to identify easily.  I will wait until the  craniotomy to appreciate the sinus cavity from above.     Dr. Jaynie Collins was able to examine the anterior fossa more completely and  identified the communication into the posterior ethmoid air cell.   Granulation tissue was identified, but the primary content of the sinus was  fibrous tissue.  Tissue was removed from the right posterior ethmoid air  cell, submitted for both culture as well as pathology.  Bone was also  submitted for culture as well as histology.  I probed this cavity with a  Penfield.  There was no obvious retained mucosa.  The bone was very  thickened and it was not obviously infected at this point.  I irrigated the  cavity and we will then reconstruct the floor of the anterior fossa with  grafts placed by Dr. Nicolette Bang and Dr. Cherly Hensen.     Postoperatively, the patient's nasal cavity care will involve regular  irrigation.  In addition, nebulized topical mucolytics and antibiotics  would be helpful.  This is a routine that will be required indefinitely.   The patient's operation continued with reconstructive surgery.           D:  07/01/2014 13:49 PM  by Dr. Dorinda Hill, MD [phone])  T:  07/01/2014 14:21 PM by Noni Saupe      Everlean Cherry: [id number]) (Doc ID: [id number])

## 2014-07-01 NOTE — Anesthesia Preprocedure Evaluation (Signed)
Anesthesia Evaluation    AIRWAY    Mallampati: II    TM distance: >3 FB  Neck ROM: full  Mouth Opening:full   CARDIOVASCULAR    cardiovascular exam normal       DENTAL           PULMONARY    pulmonary exam normal     OTHER FINDINGS    Patient on therapeutic abx at this time. PICC line in place. Will get a-line in OR and extra IV. Patient HgA1c is well controlled (6.3%) down from 9.5% in December. Patient denies any fevers, chills, or diarrhea. No vision problems at this time. Has symptomatic GERD.    ===============================================================  Inpatient Anesthesia Evaluation    Patient Name: [name] Brown  Surgeon: Madaline Savage, MD  Patient Age / Sex: 48 y.o. / male    Medical History:  Past Medical History:    Seasonal allergic rhinitis                                    Diabetes mellitus                                             Sinusitis                                                     Migraines                                                     HLD (hyperlipidemia)                                          Brain abscess                                                 Sleep apnea                                     2000            Comment:c-pap    Headache                                        1999            Comment:chronic    HTN (hypertension)                                            Past Surgical History:    CRANIOTOMY, REMOVAL  HEMATOMA, SUBDURAL           09/30/2013       Comment:Procedure: CRANIOTOMY, REMOVAL HEMATOMA,                SUBDURAL;  Surgeon: Madaline Savage, MD;                 Location:  [facility];  Service:                Neurosurgery;  Laterality: N/A;   BI-FRONTAL                CRANIOTOMY, FOR  EVACUATION OF RIGHT FRONTAL                ABSCESS      Allergies:   -- Morphine    -- Penicillins       Medications:  Current Facility-Administered Medications:  0.9%  NaCl infusion, , Intravenous, Continuous, Last Rate: 75 mL/hr at 06/30/14 2337  (MAR Hold) cefepime  (MAXIPIME) 2 g in sodium chloride 0.9 % 100 mL IVPB mini-bag plus, 2 g, Intravenous, Q8H SCH, Last Rate: 200 mL/hr at 07/01/14 0635, 2 g at 07/01/14 0635  (MAR Hold) dextrose 50 % bolus 25 mL, 25 mL, Intravenous, PRN  (MAR Hold) enoxaparin (LOVENOX) syringe 40 mg, 40 mg, Subcutaneous, Q24H, 40 mg at 06/30/14 1603  (MAR Hold) gabapentin (NEURONTIN) capsule 300 mg, 300 mg, Oral, 6X Daily, 300 mg at 06/30/14 2203  (MAR Hold) glucagon (rDNA) (GLUCAGEN) injection 1 mg, 1 mg, Intramuscular, PRN  (MAR Hold) hydrALAZINE (APRESOLINE) injection 10 mg, 10 mg, Intravenous, Q6H PRN  (MAR Hold) HYDROmorphone (DILAUDID) injection 1 mg, 1 mg, Intravenous, Q4H PRN, 1 mg at 06/25/14 0802  (MAR Hold) ibuprofen (ADVIL,MOTRIN) tablet 400 mg, 400 mg, Oral, Q6H PRN, 400 mg at 06/30/14 2336  (MAR Hold) insulin aspart (NovoLOG) injection 1-5 Units, 1-5 Units, Subcutaneous, TID AC PRN, 2 Units at 06/27/14 1305  (MAR Hold) insulin glargine (LANTUS) injection 86 Units, 86 Units, Subcutaneous, QAM, 86 Units at 06/28/14 1034  (MAR Hold) metFORMIN (GLUCOPHAGE) tablet 1,000 mg, 1,000 mg, Oral, BID Meals, 1,000 mg at 06/30/14 1602  (MAR Hold) metroNIDAZOLE (FLAGYL) 500mg  in NS IVPB (premix), 500 mg, Intravenous, Q8H SCH, Last Rate: 100 mL/hr at 06/30/14 2204, 500 mg at 06/30/14 2204  (MAR Hold) oxyCODONE (OxyCONTIN) 12 hr tablet 10 mg, 10 mg, Oral, Q12H PRN  (MAR Hold) oxyCODONE (ROXICODONE) immediate release tablet 15 mg, 15 mg, Oral, Q3H PRN  (MAR Hold) simvastatin (ZOCOR) tablet 40 mg, 40 mg, Oral, QHS, 40 mg at 06/30/14 2203  (MAR Hold) sodium chloride (PF) 0.9 % flush 10 mL, 10 mL, Intracatheter, Daily, 10 mL at 06/30/14 1103  (MAR Hold) vancomycin (VANCOCIN) 2,000 mg in sodium chloride 0.9 % 500 mL IVPB, 2,000 mg, Intravenous, Q12H, Last Rate: 250 mL/hr at 07/01/14 0432, 2,000 mg at 07/01/14 0432              Prior to Admission medications :  Medication cefTRIAXone (ROCEPHIN) 2 G injection, Sig Inject 2,000 mg (2 g total) into the vein  2 (two) times daily., Start Date 10/07/13, End Date , Taking? , Authorizing Provider Wing, Rowland Lathe, MD    Medication famotidine (PEPCID) 20 MG tablet, Sig Take 1 tablet (20 mg total) by mouth every 12 (twelve) hours., Start Date 10/07/13, End Date , Taking? , Authorizing Provider Wing, Rowland Lathe, MD    Medication gabapentin (NEURONTIN) 300 MG capsule, Sig Take 1 capsule (300 mg  total) by mouth every 8 (eight) hours.Patient taking differently: Take 300 mg by mouth 6 (six) times daily. 5-6 times daily, Start Date 10/17/13, End Date , Taking? , Authorizing Provider Everlean Alstrom, MD    Medication insulin aspart (NOVOLOG) 100 UNIT/ML injection, Sig Inject 1-12 Units into the skin as needed for High Blood Sugar., Start Date 10/07/13, End Date , Taking? , Authorizing Provider Ignacia Felling, MD    Medication insulin glargine (LANTUS) 100 UNIT/ML injection, Sig Inject 38 Units into the skin every morning., Start Date 10/17/13, End Date , Taking? , Authorizing Provider Everlean Alstrom, MD    Medication metFORMIN (GLUCOPHAGE) 1000 MG tablet, Sig Take 1 tablet (1,000 mg total) by mouth 2 (two) times daily with meals., Start Date 10/17/13, End Date , Taking? , Authorizing Provider Everlean Alstrom, MD    Medication oxyCODONE (OXYCONTIN) 10 MG 12 hr tablet, Sig Take 1 tablet (10 mg total) by mouth every 8 (eight) hours.Patient taking differently: Take 10 mg by mouth every 12 (twelve) hours as needed. , Start Date 10/17/13, End Date , Taking? , Authorizing Provider Everlean Alstrom, MD    Medication oxyCODONE (ROXICODONE) 15 MG immediate release tablet, Sig Take 1 tablet (15 mg total) by mouth every 3 (three) hours as needed., Start Date 10/17/13, End Date , Taking? , Authorizing Provider Everlean Alstrom, MD    Medication saline (OCEAN NASAL SPRAY) 0.65 % Solution, Sig 2 sprays by Each Nare route as needed., Start Date 10/07/13, End Date , Taking? , Authorizing Provider Ignacia Felling,  MD    Medication senna-docusate (PERICOLACE) 8.6-50 MG per tablet, Sig Take 2 tablets by mouth nightly., Start Date 10/07/13, End Date , Taking? , Authorizing Provider Ignacia Felling, MD    Medication simvastatin (ZOCOR) 40 MG tablet, Sig Take 1 tablet (40 mg total) by mouth daily with dinner., Start Date 10/17/13, End Date , Taking? , Authorizing Provider Everlean Alstrom, MD      Vitals  Temp:  (35.2 C (95.4 F)-37.2 C (99 F)) 35.5 C (95.9 F)  Heart Rate:  (74-92) 78  Resp Rate:  (16-20) 16  BP: (114-147)/(60-75) 135/72 mmHg    Wt Readings from Last 3 Encounters:  06/23/14 : 133.358 kg (294 lb)  10/09/13 : 117.6 kg (259 lb 4.2 oz)  09/30/13 : 124.7 kg (274 lb 14.6 oz)  BMI (Estimated body mass index is 42.18 kg/(m^2) as calculated from the following:    Height as of this encounter: 1.778 m (5\' 10" ).    Weight as of this encounter: 133.358 kg (294 lb).)  Temp Readings from Last 3 Encounters:  07/01/14 : 35.5 C (95.9 F) Oral  10/18/13 : 35.1 C (95.2 F) Axillary  10/07/13 : 35.8 C (96.4 F) Oral  BP Readings from Last 3 Encounters:  07/01/14 : 135/72  10/18/13 : 125/75  10/07/13 : 131/70  Pulse Readings from Last 3 Encounters:  07/01/14 : 78  10/18/13 : 71  10/07/13 : 75        Labs:  CBC:  Lab Results       Component                Value               Date                       WBC  7.20                06/27/2014                 HGB                      13.3                06/27/2014                 HCT                      40.5*               06/27/2014                 PLT                      225                 06/27/2014              Chemistries:  Lab Results       Component                Value               Date                       NA                       141                 06/27/2014                 K                        4.1                 06/27/2014                 CL                       108                 06/27/2014                 CO2                      24                   06/27/2014                 BUN                      12.0                06/27/2014                 CREAT                    0.8                 06/27/2014                 GLU  119*                06/27/2014                 HGBA1C                   6.3*                06/29/2014                 CA                       8.1*                06/27/2014                 AST                      39*                 10/17/2013              Coags:  Lab Results       Component                Value               Date                       PT                       14.2                06/30/2014                 INR                      1.1                 06/30/2014            _____________________      Signed by: Fortunato Curling  07/01/2014   8:38 AM    =============================================================    .                  Anesthesia Plan    ASA 3     general               (Patient and wife informed on low possibility of the need for post-operative ventilation.)      intravenous induction   Detailed anesthesia plan: general endotracheal  Monitors/Adjuncts: arterial line      Post op pain management: per surgeon and PO analgesics    informed consent obtained      pertinent labs reviewed

## 2014-07-01 NOTE — Op Note (Signed)
Procedure Date: 07/01/2014     Patient Type: I     SURGEON: Rella Larve MD  ASSISTANT:       OTHER SURGEONS:  Dr. Dorinda Hill,  Dr. Higinio Roger and Dr. Alyse Low.      PREOPERATIVE DIAGNOSES:  1.  Recurrent intracranial abscess.  2.  Anterior fossa defect.     POSTOPERATIVE DIAGNOSIS:  1.  Recurrent intracranial abscess.  2.  Anterior fossa defect.     TITLE OF PROCEDURE:  1.  Bifrontal craniotomy for evacuation of intracerebral abscess.  2.  Exposure of the anterior fossa for repair of defect.      ANESTHESIA:  General, local.     INDICATIONS:  The patient is a 48 year old male with a complex history of multiple sinus  surgeries for recurrent infections.  He most recently underwent a  craniotomy in the right side for evacuation of an intracerebral abscess  last December, presumably from his chronic sinus disease.  He presents with  recurrent headaches and a surveillance MRI showed evidence of early  cerebritis and infection in the mesial frontal lobe on the right side with  extension into the underlying sinus.  Therefore, it was felt that a  definitive treatment at this point would include exploration of the  anterior fossa with repair using a vascularized flap.     Consultations preoperatively included Dr. Dorinda Hill, ENT, Dr. Holland Falling  of Plastic Surgery as well as Dr. Tyler Deis from infectious disease.  After  extensive discussion, decision had been made to have the patient undergo  approximately 1 week of IV antibiotics to sterilize the abscess prior to  the surgery.  Attendant risks, benefits, and alternatives had been discussed and  he wished to proceed with surgery.     DESCRIPTION OF PROCEDURE:  The description of the neurosurgical portion of the procedure is as  follows. After informed consent was obtained, the patient was taken to the  operating room where after positive identification was made, general  anesthesia was induced.  He was then placed supine on the operating table.   His head was  held in 3-point fixation using a Mayfield head holder.  His  head was placed above the level of his heart with his head hyperextended in  order to allow the frontal lobes to fall back.  His head was then shaven.   His prior bicoronal incision marked out.  The plastic surgery team also  prepared the left thigh for the harvesting of the free flap.  Details of  this will be dictated separately by them.  After prepping and draping in  the usual sterile fashion.  The bicoronal incision was reopened sharply and  the scalp flap were advanced anteriorly.  Once we were able to expose the  prior craniotomy as well as exposure down to the nasion, the scalp flap was  reflected. Temporalis muscle on the left side was then taken down  approximately halfway in order to accommodate the free flap.  Because the  patient's prior craniotomy flap had partially fused, his prior craniofacial  plates were removed.  I then drilled bur holes on the left side just off  the midline as well as in the temporal area.  Underlying dura was then  dissected.  A temporal frontal flap was then turned on the left side, which  was extended across midline, incorporating a portion of the patient's prior  bone flap cut anteriorly.  The bone flap was then lifted off.  At  this  point, we proceeded to dissect out the anterior fossa dura from an  extradural approach.  The crista [place] was then exposed and resected in  piecemeal fashion using rongeurs as needed.  The dura, as expected, was  extremely adherent over the crista [place] and over the ethmoid sinus and  some sharp dissection at time needed to be performed in order to allow  mobilization of the dura.  We were able to mobilize the dura to the level  of the anterior clinoid on both sides.  At this point, Dr. Nedra Hai from ENT  inspected the anterior fossa, including the area over the ethmoid sinus.   It appeared that the defect was present over the right ethmoid as  visualized on the preoperative CT scan.  No  obvious active purulent  material was visualized here, but epidural phlegmon was present and this  was resected and sent to pathology as well as for cultures.  At this point,  I proceeded to open the dura in order to inspect the gyrus rectus and the  mesial frontal lobe on   the right side to rule out infection at these  sites.  There was some increased edema in the right side, but no purulent  material was identified.  At this point, the dura was reclosed using 4-0  Nurolon sutures.  At this point, the free flap had been harvested from the  left thigh by plastic surgery and perform the anastomosis with the  superficial temporal vessels.  This will be dictated in detail separately  by plastic surgery.  The vascularized free flap was then placed into the  anterior fossa to cover the exposed area fully.  We also had placed a layer  of Duragen on top of our dural closure for reinforcement.  Copious amount  of irrigation had been performed prior to placement of the free flap as  well as the Duragen.  Bone flap had been shaven down as needed to minimize  pressure on the vascularized free flap.  Bone flap was then resecured using  Synthes titanium mini plates.  Closure of the scalp was then performed by  plastic surgery and again will be dictated separately by them in detail.   Once the closure was completed, the patient was taken out of Mayfield pins.   He was extubated and transferred to the recovery room in stable condition.     ESTIMATED BLOOD LOSS:  Approximately 300 mL.           D:  07/01/2014 16:56 PM by Dr. Rella Larve, MD ([id number])  T:  07/01/2014 23:11 PM by KP      (Everlean Cherry: [id number]) (Doc ID: [id number])

## 2014-07-01 NOTE — Brief Op Note (Signed)
NEUROSURGERY BRIEF OP NOTE    Date Time: 07/01/2014 4:57 PM    Patient Name:   [name] Brown    Date of Operation:   07/01/2014    Providers Performing:   Surgeon(s):  Madaline Savage, MD  Higinio Roger, MD  Dorinda Hill, MD  Earle Gell, MD    Assistant (s):    Operative Procedure:   Procedure(s):  CRANIOTOMY, evacuation of abscess  ENDOSCOPIC SINUS sphenoidotomy with navigation  RECONSTRUCTION, FREE FLAP MAJOR    Preoperative Diagnosis:   Pre-Op Diagnosis Codes:     * Intracranial abscess [324.0]    Postoperative Diagnosis:   Recurrent intracerebral abscess  Anterior fossa defect    Anesthesia:   General    Estimated Blood Loss:    300cc    Implants:     Implant Name Type Inv. Item Serial No. Manufacturer Lot No. LRB No. Used Action   PUMP ON-Q W/SOAKR 400X4ML - [id number] Pain Med Pump PUMP ON-Q W/SOAKR 400X4ML  [employer]  N/A 1 Implanted   COUPLR VASCULAR 3.0MM - [id number] Coupler COUPLR VASCULAR 3.0MM  [employer] [id number]  1 Implanted   GRAFT DURAGEN PLUS 3X3IN - [id number] Graft GRAFT DURAGEN PLUS 3X3IN  [employer] [id number] Bilateral 1 Implanted   PLATE MTRX STR TITN 2HL - [id number] Plate PLATE MTRX STR TITN 2HL  [employer]  N/A 1 Implanted   PLATE DBL Y 6HL - [id number] Plate PLATE DBL Y 6HL  [employer]  N/A 1 Implanted   PLATE MTRX BOX TITN 10X16MM - [id number] Plate PLATE MTRX BOX TITN 10X16MM  [employer]  N/A 1 Implanted   BUR HL COVER TI - [id number] Cover BUR HL COVER TI  [employer]  N/A 1 Implanted   SCREW MATRX SLF DRLG TITN - [id number] Screw SCREW MATRX SLF DRLG TITN  [employer]  N/A 17 Implanted   MATRIX INTEGRA BMWD 4X5IN - [id number] Graft MATRIX INTEGRA BMWD 4X5IN   [employer] [id number] N/A 1 Implanted       Drains:   Drains: subgaleal    Specimens:        SPECIMENS (last 24 hours)      Pathology Specimens       07/01/14 1300             Additional Information    Send final report to: [name]        Specimen Information    Specimen Testing Required Routine Pathology       Specimen ID  a       Specimen Description right Ethimoid             Findings:   See dictation    Complications:   none    Condition:   stable      Signed by: Madaline Savage, MD                                                                              [facility]

## 2014-07-01 NOTE — Progress Notes (Signed)
Pt is awake in PACU with some drainage from the scalp.    Flap with excellent signals.    [name], MD [facility]  Plastic Surgery, [phone]

## 2014-07-01 NOTE — Progress Notes (Signed)
Surgery Daily Progress Note  Surgery Team Plastic Surgery    Date/Time: 07/01/2014 8:12 AM  Bed:  [facility] Day: 9  Day of Surgery  Primary Procedure: None    Interval History:   No acute issues overnight. NPO for OR this AM. Afebrile, no additional complaints    Diet: Diet NPO time specified     Active Lines, Drains and Airways     Name:   Placement date:   Placement time:   Site:   Days:    PICC Single Lumen 06/26/14 Right Basilic  06/26/14   1517   Basilic   4          Medications:     Current Facility-Administered Medications   Medication Dose Route Frequency   . [MAR Hold] cefepime  2 g Intravenous Q8H SCH   . [MAR Hold] enoxaparin  40 mg Subcutaneous Q24H   . [MAR Hold] gabapentin  300 mg Oral 6X Daily   . [MAR Hold] insulin glargine  86 Units Subcutaneous QAM   . [MAR Hold] metFORMIN  1,000 mg Oral BID Meals   . [MAR Hold] metroNIDAZOLE  500 mg Intravenous Q8H SCH   . [MAR Hold] simvastatin  40 mg Oral QHS   . [MAR Hold] sodium chloride (PF)  10 mL Intracatheter Daily   . [MAR Hold] vancomycin  2,000 mg Intravenous Q12H     . sodium chloride 75 mL/hr at 06/30/14 2337     [MAR Hold] dextrose, [MAR Hold] glucagon (rDNA), [MAR Hold] hydrALAZINE, [MAR Hold] HYDROmorphone, [MAR Hold] ibuprofen, [MAR Hold] insulin aspart, [MAR Hold] oxyCODONE, [MAR Hold] oxyCODONE     Physical Exam:   Current Vitals:   Filed Vitals:    07/01/14 0717   BP: 135/72   Pulse: 78   Temp: 95.9 F (35.5 C)   Resp: 16   SpO2: 94%     Vital signs in last 24 hours:Temp:  [95.4 F (35.2 C)-99 F (37.2 C)] 95.9 F (35.5 C)  Heart Rate:  [74-92] 78  Resp Rate:  [16-20] 16  BP: (114-147)/(60-75) 135/72 mmHg  Body mass index is 42.18 kg/(m^2).    Intake and Output Summary (Last 24 hours):       PHYSICAL EXAM:  General: AAOx3, NAD  Chest: CTAB  Heart:  RRR, no mrg  Abdomen: Healed LLQ incision, soft, nontender  Extremities:  Peripheral pulses intact, no edema  Neuro: CN 2-12 intact, gross motor intact, sensation grossly  intact      Labs:     Hematology   No results for input(s): WBC, HGB, HCT, PLT in the last 72 hours.   Chemistry   No results for input(s): NA, K, CL, CO2, BUN, CREAT, GLU, CA, MG, PHOS in the last 72 hours.    Invalid input(s): CR, CREATININE   Coagulation   Recent Labs      06/30/14   0816   PT  14.2   PT INR  1.1      Liver Function Tests   No results for input(s): AST, ALT, ALKPHOS, PROT, ALB, BILITOTAL, BILIDIRECT, LIP, AMY, PREALB in the last 72 hours.     Cultures:  None     Rads:   Radiological Procedure reviewed.  No results found.      Assessment:   [name] Brown is a 48 y.o. male hx HTN, DM, brain abscess s/p craniotomy 09/2013 now w recurrent brain abscess    Plan:   OR today with Dr Nedra Hai, Dr. Jaynie Collins,  Dr. Nicolette Bang, Dr. Cherly Hensen  Continue abx per ID; currently vanc/cefepime/flagyl  Consented  PT/INR, T&S wnl  To ICU post-op for flap checks    M. Everlena Cooper, MD  General Surgery PGY-2  Pager: [phone]

## 2014-07-01 NOTE — Brief Op Note (Signed)
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 Item Serial No. Manufacturer Lot No. LRB No. Used Action   PUMP ON-Q W/SOAKR 400X4ML - [id number] Pain Med Pump PUMP ON-Q W/SOAKR 400X4ML  [employer]  N/A 1 Implanted   COUPLR VASCULAR 3.0MM - [id number] Coupler COUPLR VASCULAR 3.0MM  [employer] [id number]  1 Implanted   GRAFT DURAGEN PLUS 3X3IN - [id number] Graft GRAFT DURAGEN PLUS 3X3IN  [employer] [id number] Bilateral 1 Implanted   PLATE MTRX STR TITN 2HL - [id number] Plate PLATE MTRX STR TITN 2HL  [employer]  N/A 1 Implanted   PLATE DBL Y 6HL - [id number] Plate PLATE DBL Y 6HL  [employer]  N/A 1 Implanted   PLATE MTRX BOX TITN 10X16MM - [id number] Plate PLATE MTRX BOX TITN 10X16MM  [employer]  N/A 1 Implanted   BUR HL COVER TI - [id number] Cover BUR HL COVER TI  [employer]  N/A 1 Implanted   SCREW MATRX SLF DRLG TITN - [id number] Screw SCREW MATRX SLF DRLG TITN  [employer]  N/A 17 Implanted   MATRIX INTEGRA BMWD 4X5IN - [id number] Graft MATRIX INTEGRA BMWD 4X5IN   [employer] [id number] N/A 1 Implanted           Urine output:      1L albumin, 1500 saline    Drains:   Drain #1: Blake [place] 19 Fr  Drain #2: Blake Round 15 Fr    Specimens:        SPECIMENS (last 24 hours)      Pathology Specimens        07/01/14 1300             Additional Information    Send final report to: Lim       Specimen Information    Specimen Testing Required Routine Pathology       Specimen ID  [email or web address]       Specimen Description right Ethimoid             Findings:   Chronic granulation tissue  No frank abscess      Complications:   none      Signed by: Higinio Roger, MD  [place] [facility]

## 2014-07-01 NOTE — Brief Op Note (Deleted)
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 Item Serial No. Manufacturer Lot No. LRB No. Used Action   PUMP ON-Q W/SOAKR 400X4ML - [id number] Pain Med Pump PUMP ON-Q W/SOAKR 400X4ML  [employer]  N/A 1 Implanted   COUPLR VASCULAR 3.0MM - [id number] Coupler COUPLR VASCULAR 3.0MM  [employer] [id number]  1 Implanted   GRAFT DURAGEN PLUS 3X3IN - [id number] Graft GRAFT DURAGEN PLUS 3X3IN  [employer] [id number] Bilateral 1 Implanted   PLATE MTRX STR TITN 2HL - [id number] Plate PLATE MTRX STR TITN 2HL  [employer]  N/A 1 Implanted   PLATE DBL Y 6HL - [id number] Plate PLATE DBL Y 6HL  [employer]  N/A 1 Implanted   PLATE MTRX BOX TITN 10X16MM - [id number] Plate PLATE MTRX BOX TITN 10X16MM  [employer]  N/A 1 Implanted   BUR HL COVER TI - [id number] Cover BUR HL COVER TI  [employer]  N/A 1 Implanted   SCREW MATRX SLF DRLG TITN - [id number] Screw SCREW MATRX SLF DRLG TITN  [employer]  N/A 17 Implanted   MATRIX INTEGRA BMWD 4X5IN - [id number] Graft MATRIX INTEGRA BMWD 4X5IN   [employer] [id number] N/A 1 Implanted       Drains:   Drains: Subgaleal drain    Specimens:        SPECIMENS (last 24 hours)      Pathology Specimens       07/01/14 1300             Additional Information    Send final report to: [name] Brown       Specimen Information     Specimen Testing Required Routine Pathology       Specimen ID  [email or web address]       Specimen Description right Ethimoid             Findings:   See dictation    Complications:   none    Condition:   stable      Signed by: Madaline Savage, MD                                                                              [place] [facility]

## 2014-07-02 LAB — IGG 4: IgG Subclass 4: 112.6 mg/dL — ABNORMAL HIGH (ref 4.0–86.0)

## 2014-07-02 LAB — GLUCOSE WHOLE BLOOD - POCT
Whole Blood Glucose POCT: 163 mg/dL — ABNORMAL HIGH (ref 70–100)
Whole Blood Glucose POCT: 178 mg/dL — ABNORMAL HIGH (ref 70–100)
Whole Blood Glucose POCT: 147 mg/dL — ABNORMAL HIGH (ref 70–100)
Whole Blood Glucose POCT: 153 mg/dL — ABNORMAL HIGH (ref 70–100)
Whole Blood Glucose POCT: 136 mg/dL — ABNORMAL HIGH (ref 70–100)

## 2014-07-02 LAB — IGG 1(SOFT): IgG Subclass 1: 841 mg/dL (ref 382–929)

## 2014-07-02 MED ORDER — DIPHENHYDRAMINE HCL 25 MG PO CAPS
25.0000 mg | ORAL_CAPSULE | Freq: Four times a day (QID) | ORAL | Status: DC | PRN
Start: 2014-07-02 — End: 2014-07-08
  Administered 2014-07-02 – 2014-07-04 (×4): 25 mg via ORAL
  Filled 2014-07-02 (×4): qty 1

## 2014-07-02 MED ORDER — DIPHENHYDRAMINE HCL 25 MG PO CAPS
50.0000 mg | ORAL_CAPSULE | Freq: Four times a day (QID) | ORAL | Status: DC | PRN
Start: 2014-07-02 — End: 2014-07-02

## 2014-07-02 MED ORDER — INSULIN GLARGINE 100 UNIT/ML SC SOLN
43.0000 [IU] | Freq: Every evening | SUBCUTANEOUS | Status: DC
Start: 2014-07-02 — End: 2014-07-03
  Filled 2014-07-02: qty 43

## 2014-07-02 MED ORDER — VANCOMYCIN HCL 1000 MG IV SOLR
1750.0000 mg | Freq: Three times a day (TID) | INTRAVENOUS | Status: DC
Start: 2014-07-02 — End: 2014-07-04
  Administered 2014-07-02 – 2014-07-04 (×7): 1750 mg via INTRAVENOUS
  Filled 2014-07-02 (×9): qty 1750

## 2014-07-02 MED ORDER — DIPHENHYDRAMINE HCL 25 MG PO CAPS
25.0000 mg | ORAL_CAPSULE | Freq: Four times a day (QID) | ORAL | Status: DC | PRN
Start: 2014-07-02 — End: 2014-07-02

## 2014-07-02 MED ORDER — DEXTROSE-SODIUM CHLORIDE 5-0.9 % IV SOLN
INTRAVENOUS | Status: DC
Start: 2014-07-02 — End: 2014-07-03

## 2014-07-02 MED ORDER — INSULIN GLARGINE 100 UNIT/ML SC SOLN
43.0000 [IU] | Freq: Every morning | SUBCUTANEOUS | Status: DC
Start: 2014-07-03 — End: 2014-07-02

## 2014-07-02 MED ORDER — ASPIRIN 325 MG PO TABS
325.0000 mg | ORAL_TABLET | Freq: Every day | ORAL | Status: DC
Start: 2014-07-02 — End: 2014-07-08
  Administered 2014-07-02 – 2014-07-08 (×7): 325 mg via ORAL
  Filled 2014-07-02 (×7): qty 1

## 2014-07-02 NOTE — Progress Notes (Addendum)
ICU ACUTE CARE SURGERY / TRAUMA PROGRESS NOTE     Date/Time: 07/02/2014 7:53 AM  Patient Name: [facility]  Primary Care Physician: Tressa Busman, MD  Hospital Day: 9  Procedure(s):  CRANIOTOMY, REMOVAL TUMOR  ENDOSCOPIC SINUS sphenoidotomy with navigation  RECONSTRUCTION, FREE FLAP MAJOR   Post-op Day: 1 Day Post-Op    Assessment/Plan:     Patient Active Problem List   Diagnosis   . Brain abscess   . Type 2 diabetes mellitus   . Chronic headache   . OSA (obstructive sleep apnea)   . HLD (hyperlipidemia)   . Sinusitis   . HTN (hypertension)       Plan by systems:  Neuro:  - Pain moderately well-controlled on On-Q pump, gabapentin, dPCA, and oxycodone, ibuprofen  Seizure Prophylaxis not indicated  Pulm:  - Stable  - CPAP  CV:  - Stable  - Hydralazine PRN  Endo:  - BG elevated, 132-244  - ISS, Metformin  GI:  - Simvastin  GI Prophylaxis:  No prophylaxis need, patient is on a diet   Heme/ID:  - Tm 98.4/98.1  - WBC postop - 12.5  - Cefepime   - Flagyl  - Vancomycin  - ASA supp  DVT Prophylaxis: enoxaparin 40 Daily  Renal:  - Good UOP 4.5L  Foley: yes  Neuromuscular:  Weight Bearing Right Left   Upper Extremity WBAT WBAT   Lower Extremity WBAT WBAT   PT/OT: no  Psych:  - Supportive  Wounds:   - scalp incision partially closed, ss drainage, JP in scalp - not to suction; JP to LLE to suction with ss output   - flap checks q1H  - A/V doppler probes to remain in flap per Plastics  - Scalp JP - 10  - LLE JP - 10      Interval History:   Antwoin Lackey is a 48 y.o. male who is 1 Day Post-Op s/p craniotomy, endoscopic schenoidotomy, and vastus lateralis free flap for recurrent brain abscess with anterior fossa defect.     Significant overnight events include OR yesterday, recovering well with good doppler signals in flap.    Allergies:     Allergies   Allergen Reactions   . Morphine    . Penicillins        Medications:     Current Facility-Administered Medications   Medication Dose Route Frequency Provider Last Rate  Last Dose   . 0.9%  NaCl infusion   Intravenous Continuous Amatya, Birendra Man, MD 75 mL/hr at 07/02/14 0700     . aspirin suppository 300 mg  300 mg Rectal Daily Higinio Roger, MD   300 mg at 07/01/14 2141   . bupivacaine (MARCAINE) 0.25 % 550 mL in ON Q PUMP  2 mL/hr Percutaneous Continuous Higinio Roger, MD 2 mL/hr at 07/01/14 1739 2 mL/hr at 07/01/14 1739   . cefepime (MAXIPIME) 2 g in sodium chloride 0.9 % 100 mL IVPB mini-bag plus  2 g Intravenous [facility] Guadalupe Maple, MD 200 mL/hr at 07/02/14 0500 2 g at 07/02/14 0500   . dextrose 50 % bolus 25 mL  25 mL Intravenous PRN Atnafu, Belay W, MD       . enoxaparin (LOVENOX) syringe 40 mg  40 mg Subcutaneous Q24H Atnafu, Vinie Sill, MD   40 mg at 06/30/14 1603   . gabapentin (NEURONTIN) capsule 300 mg  300 mg Oral 6X Daily Gardiner Sleeper, MD   300 mg at 07/02/14 0616   .  glucagon (rDNA) (GLUCAGEN) injection 1 mg  1 mg Intramuscular PRN Atnafu, Belay W, MD       . hydrALAZINE (APRESOLINE) injection 10 mg  10 mg Intravenous Q6H PRN Gardiner Sleeper, MD   10 mg at 07/01/14 2022   . HYDROmorphone (DILAUDID) 0.2 mg/mL in sodium chloride 0.9% 100 mL PCA   Intravenous Continuous Barry Brunner, MD   20 mg at 07/01/14 2133   . HYDROmorphone (DILAUDID) injection 1 mg  1 mg Intravenous Q4H PRN Gardiner Sleeper, MD   1 mg at 07/01/14 2022   . ibuprofen (ADVIL,MOTRIN) tablet 400 mg  400 mg Oral Q6H PRN Rayna Sexton, MD   400 mg at 06/30/14 2336   . insulin aspart (NovoLOG) injection 1-5 Units  1-5 Units Subcutaneous TID AC PRN Rafiq, Altha Harm, MD   2 Units at 07/01/14 2209   . insulin glargine (LANTUS) injection 86 Units  86 Units Subcutaneous QAM Atnafu, Vinie Sill, MD   86 Units at 06/28/14 1034   . lactated ringers infusion   Intravenous Continuous Fortunato Curling, MD   Stopped at 07/01/14 1837   . metFORMIN (GLUCOPHAGE) tablet 1,000 mg  1,000 mg Oral BID Meals Atnafu, Vinie Sill, MD   1,000 mg at 06/30/14 1602   . metroNIDAZOLE (FLAGYL) 500mg  in NS IVPB  (premix)  500 mg Intravenous [facility] Guadalupe Maple, MD 100 mL/hr at 07/02/14 0146 500 mg at 07/02/14 0146   . naloxone [facility]) injection 0.1 mg  0.1 mg Intravenous PRN Barry Brunner, MD       . oxyCODONE (OxyCONTIN) 12 hr tablet 10 mg  10 mg Oral Q12H PRN Gardiner Sleeper, MD       . oxyCODONE (ROXICODONE) immediate release tablet 15 mg  15 mg Oral Q3H PRN Gardiner Sleeper, MD   15 mg at 07/02/14 0615   . simvastatin (ZOCOR) tablet 40 mg  40 mg Oral QHS Gardiner Sleeper, MD   40 mg at 07/01/14 2141   . sodium chloride (PF) 0.9 % flush 10 mL  10 mL Intracatheter Daily Otilio Miu, MD   10 mL at 06/30/14 1103   . vancomycin (VANCOCIN) 2,000 mg in sodium chloride 0.9 % 500 mL IVPB  2,000 mg Intravenous Q12H Dorena Cookey, MD 250 mL/hr at 07/02/14 0616 2,000 mg at 07/02/14 5621       Labs:     Results     Procedure Component Value Units Date/Time    MRSA culture [[id number]] Collected:  07/01/14 2212    Specimen Information:  Body Fluid / Nares and Throat Updated:  07/02/14 0232    Wound culture & gram stain [[id number]] Collected:  07/01/14 1303    Specimen Information:  Wound / Abscess Updated:  07/01/14 2239    Narrative:      ORDER#: [id number]                                    ORDERED BY: LIM, JAE  SOURCE: Abscess right ethimoid                       COLLECTED:  07/01/14 13:03  ANTIBIOTICS AT COLL.:                                RECEIVED :  07/01/14 19:23  Stain, Gram                                FINAL       07/01/14 22:39  07/01/14   No WBCs or organisms seen  Culture and Gram Stain, Aerobic, Wound     PENDING      Glucose Whole Blood - POCT [[id number]]  (Abnormal) Collected:  07/01/14 2206     POCT - Glucose Whole blood 244 (H) mg/dL Updated:  63/87/56 4332    Wound culture & gram stain [[id number]] Collected:  07/01/14 1303    Specimen Information:  Wound / Abscess Updated:  07/01/14 2035    Narrative:      ORDER#: [id number]                                    ORDERED BY: LIM, JAE  SOURCE:  Abscess right ethomoid bone                  COLLECTED:  07/01/14 13:03  ANTIBIOTICS AT COLL.:                                RECEIVED :  07/01/14 19:27  Stain, Gram                                FINAL       07/01/14 20:35  07/01/14   No WBCs or organisms seen  Culture and Gram Stain, Aerobic, Wound     PENDING      AFB culture and smear [[id number]] Collected:  07/01/14 1303    Specimen Information:  Sputum / Tissue Updated:  07/01/14 1927    Anaerobic culture [[id number]] Collected:  07/01/14 1303    Specimen Information:  Other / Bone Updated:  07/01/14 1927    Fungus culture [[id number]] Collected:  07/01/14 1303    Specimen Information:  Other / Bone Updated:  07/01/14 1927    AFB culture and smear [[id number]] Collected:  07/01/14 1303    Specimen Information:  Sputum / Tissue Updated:  07/01/14 1923    Anaerobic culture [[id number]] Collected:  07/01/14 1303    Specimen Information:  Other / Tissue Updated:  07/01/14 1923    Fungus culture [[id number]] Collected:  07/01/14 1303    Specimen Information:  Other / Tissue Updated:  07/01/14 1923    HIV Exposure Panel [[id number]] Collected:  07/01/14 1431     HIV Exposure panel Non-Reactive Updated:  07/01/14 1755    Glucose Whole Blood - POCT [[id number]]  (Abnormal) Collected:  07/01/14 1730     POCT - Glucose Whole blood 239 (H) mg/dL Updated:  81/01/75 1025    Employee [employer] HCV (Hepatitis C Virus) AB [[id number]] Collected:  07/01/14 1431     [employer] HCV Antibody Non-Reactive Updated:  07/01/14 1656    Employee Health HBSAG (Hepatitis B Surface AG) [[id number]] Collected:  07/01/14 1431     [employer] HBS AG Nonreactive Updated:  07/01/14 1656    Comprehensive metabolic panel [[id number]]  (Abnormal) Collected:  07/01/14 1545     Glucose 204 (H) mg/dL Updated:  95/09/32 6712     BUN 13.0 mg/dL      Creatinine 0.9  mg/dL      Sodium 161 (L) mEq/L      Potassium 4.4 mEq/L      Chloride 106 mEq/L      CO2 18 (L) mEq/L      CALCIUM 7.8 (L) mg/dL      Protein,  Total 6.5 g/dL      Albumin 3.9 g/dL      AST (SGOT) 73 (H) U/L      ALT 99 (H) U/L      Alkaline Phosphatase 51 U/L      Bilirubin, Total 0.5 mg/dL      Globulin 2.6 g/dL      Albumin/Globulin Ratio 1.5     GFR [[id number]] Collected:  07/01/14 1545     EGFR >60.0 Updated:  07/01/14 1654    CBC without differential [[id number]]  (Abnormal) Collected:  07/01/14 1545     WBC 12.45 (H) x10 3/uL Updated:  07/01/14 1641     RBC 4.13 (L) x10 6/uL      Hgb 12.4 (L) g/dL      Hematocrit 95.6 (L) %      MCV 88.9 fL      MCH 30.0 pg      MCHC 33.8 g/dL      RDW 13 %      Platelets 224 x10 3/uL      MPV 10.0 fL      Nucleated RBC 0 /100 WBC     CVOR1 AGNKC GL THGB CLWBL [[id number]]  (Abnormal) Collected:  07/01/14 1500     pH, Arterial 7.366 Updated:  07/01/14 1524     pCO2, Arterial 34.2 (L) mmhg      pO2, Arterial 152.0 (H) mmhg      HCO3, Arterial 19.1 (L) mEq/L      Arterial Total CO2 20.1 (L) mEq/L      Base Excess, Arterial -5.0 (L) mEq/L      O2 Sat, Arterial 99.2 %      ABG CollectionSite Art Line      Temperature 37.0      FIO2 tor %      Sodium Whole Blood 134 (L) mEq/L      Potassium Whole Blood 4.3 mEq/L      Calcium, Ionized 2.10 (L) mEq/L      Glucose Whole Blood 207 (H) mg/dL      Chloride, WB 469 mEq/L      Total Hemoglobin 13.1 g/dL      Total Hematocrit Calculated 40.4 %     RBC OR HOLD Blood Product [[id number]] Collected:  06/30/14 0817     RBC Leukoreduced [id number]    selected Updated:  07/01/14 0828     RBC Leukoreduced [id number]    selected     Glucose Whole Blood - POCT [[id number]]  (Abnormal) Collected:  07/01/14 0719     POCT - Glucose Whole blood 132 (H) mg/dL Updated:  51/88/41 6606          Rads:   Radiological Procedure reviewed.    Radiology Results (24 Hour)     ** No results found for the last 24 hours. **          Physical Exam:   Temp:  [97.8 F (36.6 C)-98.4 F (36.9 C)] 98.1 F (36.7 C)  Heart Rate:  [80-104] 83  Resp Rate:  [11-24] 21  BP: (110-164)/(58-86) 114/70 mmHg  Arterial  Line BP: (156-179)/(83-92) 157/84 mmHg      Invasive ICU Hemodynamics:  Art Line  Arterial Line BP: 157/84 mmHg (07/01/14 1930)  Arterial Line MAP (mmHg): 106 mmHg (07/01/14 1930)  Pulse rate: 98 mmHg (07/01/14 1930)    Art Line (2)  Arterial Line BP 2: 140/80 mmHg (07/02/14 0700)  Arterial Line MAP (mmHg): 100 mmHg (07/02/14 0700)  ABP 2 Pulse Rate: 80 mmHg (07/02/14 0700)    Vital Signs:  Filed Vitals:    07/02/14 0700   BP: 114/70   Pulse: 83   Temp:    Resp: 21   SpO2: 93%       Vent Settings:             I/O:  Intake and Output Summary (Last 24 hours) at Date Time  I/O last 3 completed shifts:  In: 8453.75 [I.V.:3253.75; IV Piggyback:5200]  Out: 1610 [RUEAV:4098; Drains:20; Blood:250]    Output:     Closed/Suction Drain Left Thigh Bulb 19 Fr.-Output (mL): 10 mL (07/02/14 0616)  Closed/Suction Drain Right Scalp Bulb 15 Fr.-Output (mL): 10 mL (07/02/14 1191)     Urethral Catheter Double-lumen;Latex 16 Fr.-Output (mL): 100 mL (07/02/14 0700)                           Nutrition:   Orders Placed This Encounter   Procedures   . Diet NPO effective now       Physical Exam:  Physical Exam   Constitutional: He is oriented to person, place, and time. He appears well-developed and well-nourished. No distress.   HENT:   Head:       Eyes: Pupils are equal, round, and reactive to light.   Cardiovascular: Normal rate and regular rhythm.    Pulmonary/Chest: Effort normal and breath sounds normal. No respiratory distress. He has no wheezes.   Abdominal: Soft. He exhibits no distension. There is no tenderness.   Musculoskeletal:   LLE in ACE wrap, minimal staining, JP bulb to suction, ss   Neurological: He is oriented to person, place, and time.   Skin: Skin is warm and dry.         Attending Attestation:

## 2014-07-02 NOTE — Progress Notes (Signed)
Neurosurgery Progress Note  POD 1  S:   Complains of fluctuating headaches and white spots in vision L side intermittent    O:   Filed Vitals:    07/02/14 2200   BP:    Pulse: 82   Temp:    Resp: 19   SpO2: 99%         PHYSICAL EXAM:  Awake, alert  Oriented X 3  PERRL  Speech is fluent  Follows commands briskly  Motor strength is grossly full except L leg limited due to harvest site pain        A/P:   Neuro grossly intact  Etiology of intermittent white spots in vision not clear but not worrisome  Will hold off on head CT until day shift tomorrow      Rella Larve, MD

## 2014-07-02 NOTE — Progress Notes (Addendum)
Plastic Surgery POC Note    47yoM s/p craniotomy, endoscopic shenoidotomy, free-flap reconstruction for recurrent brain abscess with anterior fossa defect    Pt seen in TICU, doing well. Extubated, awake, talkative. Pain moderately controlled. Flaps with excellent signals    O  Filed Vitals:    07/01/14 2300   BP: 146/86   Pulse: 102   Temp:    Resp: 17   SpO2: 96%     PHYSICAL EXAM:  General:AAOx3, NAD  HEENT: scalp incision with staples in place, partially closed, minimal amt ss drainage, drain in place with minimal output, flaps with excellent arterial/venous doppler signals  CV: RRR, no murmurs, rubs, gallops  Pulm: CTAB, no wheezes, rhonci  Extremities: LLE with ACE wrap in place, elevated, JP in place with minimal output, on-q in place  Neuro:  Sensation intact throughout, CN 2-12 intact, gross motor intact    A&P  Continue q1hr flap checks with continuous flow monitors  Continue q1hr neurovascular checks to LLE  Avoid compression to forehead  Critical care per sccs  ASA 325 and lovenox 40 qd ok  Continue abx per ID    M. Everlena Cooper, MD  General Surgery PGY-2  Pager: [phone]

## 2014-07-02 NOTE — Progress Notes (Signed)
[facility] HOSPITALIST PROGRESS NOTE    Date Time: 07/02/2014 3:28 PM  Patient Name: [name]  Attending Physician: Balinda Quails Man, MD    Assessment:   1. Brain abscess with anterior fossa defect  2. 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 Extensive history of recurrent sinusitis  4. DM  5. OSA  6. HTN  7. HLD  8. Chronic headache     Plan:    Underwent CRANIOTOMY, evacuation of abscess,  endoscopic sinus sphenoidotomy with navigation and reconstruction with Neuro/plastic/ENT team on 07/01/14. Will Continue with vancomycin, flagyl and cefepime (# 10).  Will continue IV antibiotics. Continue pain management. Wound care as per surgery team. neuro check Q 1 hour. Had PICC already. Blood cultures--> NGTD. 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 Extensive bony dehiscence medial wall right orbit. Extensive bony dehiscence right cribriform plate. Small area of bony dehiscence superomedial aspect of right orbit. F/U surgery and ENT teams   DM- was optimally controlled before surgery. Now not well controlled. On lantus- 43 units (used to get 86 units Sq daily along with Metformin 1000 mg PO BID) and Novolog SS.   Metformin has been off. Recent HbA1c- 6.3.    On home CPAP   Continue statin and ASA    Case discussed with: pt and NS team/RN    Safety Checklist:     DVT prophylaxis:  CHEST guideline (See page [id number]) Mechanical and chemical    Foley:  [place] Rn Foley protocol Not present   IVs:  Peripheral IV   PT/OT: Not needed   Daily CBC & or Chem ordered:  SHM/ABIM guidelines (see #5) No       Lines:     Active PICC Line / CVC Line / PIV Line / Drain / Airway / Intraosseous Line / Epidural Line / ART Line / Line / Wound / Pressure Ulcer / NG/OG Tube     Name:   Placement date:   Placement time:   Site:   Days:    Peripheral IV 06/23/14 Left Forearm  06/23/14      Forearm   1           Disposition:     Today's date: 07/02/2014  Length of Stay: 9  Anticipated medical stability for discharge: 1-2 days   Reason for  ongoing hospitalization: brain abscess   Anticipated discharge needs: TBD    Subjective     CC: Brain abscess    Interval History/24 hour events/subjective: s/p surgery- POD 1, pain- tolerable with current pain management. No fever, chills.,vomiting, focal weakness, seziures, and SOB/chest pain.        HPI: [name] Brown is a 48 y.o. male w/ h/o DM type 2, chronic headache, OSA, HLD, sinusitis, HTN, brain abscess s/p craniotomy, iv Rocephin for MSSA who p/w recurrent brain abscess on recent MRI. Pt reports bilateral retroorbital headaches weekly at around 2-3am which wake him up. Pt reports intermittent photophobia but denies visual changes or N/V. Pt denies F/C/D/SOB/CP/abd pain or any other sx's. Pt was admitted to NSICU on 09/30/13, d/c'ed to rehab on 10/07/14. Pt was d/c'ed home on 10/18/13.    Review of Systems:     See above    Physical Exam:     VITAL SIGNS PHYSICAL EXAM   Temp:  [97.8 F (36.6 C)-98.7 F (37.1 C)] 98.7 F (37.1 C)  Heart Rate:  [67-104] 73  Resp Rate:  [9-35] 10  BP: (101-167)/(56-86) 162/69 mmHg  Arterial Line BP: (156-179)/(83-92) 157/84 mmHg  Blood Glucose:    Telemetry: NSR      Intake/Output Summary (Last 24 hours) at 07/02/14 1528  Last data filed at 07/02/14 1500   Gross per 24 hour   Intake 8228.75 ml   Output   3175 ml   Net 5053.75 ml    General: No apparent distress, awake, alert, comfortable appearing, vital signs noted. Incision over scalp- clean and dry.   Pupils- reactive, equal, no pallor, no icterus  Neck- supple, no mass, no bruits/JVP   Resp: Clear to ausculation, no wheezing, rhonci, or rales noted; normal respiratory effort   CV: Regular rate and rhythm, no murmurs, gallops, or rubs; no peripheral edema   GI: Abdomen obese, soft, non-tender, no masses; no hepatosplenomegaly   Skin: Normal temperature, tone, turgor; no rash, lesions, ulcers   Musc: No digital cyanosis or ischemia   Neuro: AOx3, CN 2-12 grossly intact, power 5/5 allover, sensation intact, DTR +1          Meds:     Medications were reviewed:    Labs:     Labs (last 72 hours):      Recent Labs  Lab 07/01/14  1545 06/27/14  0357   WBC 12.45* 7.20   HEMOGLOBIN 12.4* 13.3   HEMATOCRIT 36.7* 40.5*   PLATELETS 224 225         Recent Labs  Lab 06/30/14  0816   PT 14.2   PT INR 1.1      Recent Labs  Lab 07/01/14  1545 06/27/14  0357   SODIUM 135* 141   POTASSIUM 4.4 4.1   CHLORIDE 106 108   CO2 18* 24   BUN 13.0 12.0   CREATININE 0.9 0.8   CALCIUM 7.8* 8.1*   ALBUMIN 3.9  --    PROTEIN, TOTAL 6.5  --    BILIRUBIN, TOTAL 0.5  --    ALKALINE PHOSPHATASE 51  --    ALT 99*  --    AST (SGOT) 73*  --    GLUCOSE 204* 119*                   Microbiology, reviewed and are significant for:  Microbiology Results     Procedure Component Value Units Date/Time    CULTURE BLOOD AEROBIC AND ANAEROBIC [[id number]] Collected:  06/23/14 2124    Specimen Information:  Blood, Venipuncture Updated:  06/23/14 2300    Narrative:      1 BLUE+1 PURPLE    CULTURE BLOOD AEROBIC AND ANAEROBIC [[id number]] Collected:  06/23/14 2055    Specimen Information:  Blood, Venipuncture Updated:  06/23/14 2209    Narrative:      1 BLUE+1 PURPLE          Imaging, reviewed and are significant for:  Radiology Results (24 Hour)     ** No results found for the last 24 hours. **            Signed by: Lucillie Garfinkel, MD

## 2014-07-02 NOTE — Progress Notes (Signed)
ID PROGRESS NOTE    Date Time: 07/02/2014 6:09 PM  Patient Name: [facility]      Problem List:    Acute  Worsening headaches  New changes in MRI suggesting abscess in R cribriform plate/ R frontal lobe and medial R orbit/ medial rectus muscle  Prior MSSA brain abscess in 09/2013, treated with drainage and IV abx   -S/P combine neurosurgery and plastics intervention 9/5 for repair of defect.  No pus, but phlegmon noted and sent for pathology  Diarrhea chronic  -------------------------------------  Chronic conditions  Diabetes mellitus  Sleep apnea  Extensive history of recurrent sinusitis  -- multiple sinus surgeries, including at least two craniotomies    Assessment:   S/P reconstructive surgery.  No pus encountered, but phlegmon or inflammatory tissue described.  Biopsies are pending.  Pt is doing well clinically post op.  Cultures are unlikely to be positive post 1 week of Abx    Antibiotics:   #10 Vancomycin 1.75 gm IV q 8   #10 Cefepime 2 gm IV q 8H  #10 Metronidazole 500mg  IV q 8H    Plan:   COntinue current regimen  Will repeat Vanc trough  Awaiting cultures and surgical pathology  Discussed with pt and wife  ________________________________________________________________________  I have considered the potential drug interactions between antimicrobial agents   I have recommended and other medications required by the patient and adjusted appropriately for renal function   I have given thought to the complex medical conditions present and have endeavored to balance the interventions required by the acute conditions with the potential toxicities of the medications and procedures on the patient's well being and on the status of the other chronic conditions  I have engaged considerations and discussions about short and long term prognosis   I have discussed the case with appropriate family members and team members  Patient seen in the Intensive Care Unit  Critical care time spent analyzing case and coordinating  care: 30 minutes    Lines:   PICC Single Lumen 06/26/14 Right Basilic  Arterial Line 07/01/14 Left Radial  *I have performed a risk-benefit analysis and the patient needs a central line for access and IV medications    Family History:     Family History   Problem Relation Age of Onset   . Stroke Neg Hx        Social History:     History     Social History   . Marital Status: Married                      Social History Main Topics   . Smoking status: Light Tobacco Smoker     Types: Cigars   . Smokeless tobacco: Never Used      Comment: Occasional Cigars   . Alcohol Use: 0.0 oz/week     4-5 Cans of beer per week   . Drug Use: No            Allergies:     Allergies   Allergen Reactions   . Morphine    . Penicillins        Review of Systems:   General ROS: negative for - chills, fevers, night sweats, weight loss   HEENT: negative for - blurry vision, sore throat, thrush   Respiratory ROS: negative for cough, SOB  Cardiovascular ROS: negative for - chest pain, palpitations   Gastrointestinal ROS: negative for - abdominal pain, nausea, vomiting, diarrhea  Genito-Urinary ROS:  negative for - dysuria, urinary frequency/urgency   Musculoskeletal ROS: negative for - joint pain, joint stiffness or muscle pain   Dermatological ROS: negative for - rash and skin lesion changes   Neurological ROS: negative for - confusion, headache, dizziness  Hematological ROS: negative for - bruising, bleeding   Psychological ROS: negative for - changes in mood    Physical Exam:     Filed Vitals:    07/02/14 1700   BP: 156/78   Pulse: 81   Temp: 98.8   Resp: 23   SpO2: 94%       General Appearance: alert and appropriate, non-toxic  Neuro: alert, oriented, normal speech, normal attention and cognition  HEENT: no scleral icterus, pupils round and reactive, OP clear, clean surgical incisions.  Drains in place  Neck: supple  Lungs: clear to auscultation, no wheezes, rales or rhonchi, symmetric air entry   Cardiac: normal rate, regular rhythm, normal  S1, S2, No m/r/g  Abdomen: soft, non-tender, non-distended, normal active bowel sounds, No masses or organomegaly  Extremities: no pedal edema, NO c/c.  Dressing over L thigh  Skin: no rash  Psych: normal mood and affect    Labs:     Lab Results   Component Value Date    WBC 12.45* 07/01/2014    HGB 12.4* 07/01/2014    HCT 36.7* 07/01/2014    MCV 88.9 07/01/2014    PLT 224 07/01/2014     Lab Results   Component Value Date    CREAT 0.9 07/01/2014     Lab Results   Component Value Date    ALT 99* 07/01/2014    AST 73* 07/01/2014    ALKPHOS 51 07/01/2014    BILITOTAL 0.5 07/01/2014     No results found for: LACTATE    Microbiology:     Microbiology Results     Procedure Component Value Units Date/Time    AFB culture and smear [[id number]] Collected:  07/01/14 1303    Specimen Information:  Sputum / Tissue Updated:  07/02/14 0854    Narrative:      ORDER#: [id number]                                    ORDERED BY: LIM, JAE  SOURCE: Tissue skull (right ethimoid bone)           COLLECTED:  07/01/14 13:03  ANTIBIOTICS AT COLL.:                                RECEIVED :  07/01/14 19:27  Stain, Acid Fast                           FINAL       07/02/14 08:54  07/02/14   No Acid Fast Bacillus Seen  Culture Acid Fast Bacillus (AFB)           PENDING      AFB culture and smear [[id number]] Collected:  07/01/14 1303    Specimen Information:  Sputum / Tissue Updated:  07/02/14 0854    Narrative:      ORDER#: [id number]                                    ORDERED  BY: LIM, JAE  SOURCE: Tissue right Ethimoid                        COLLECTED:  07/01/14 13:03  ANTIBIOTICS AT COLL.:                                RECEIVED :  07/01/14 19:23  Stain, Acid Fast                           FINAL       07/02/14 08:54  07/02/14   No Acid Fast Bacillus Seen  Culture Acid Fast Bacillus (AFB)           PENDING      Anaerobic culture [[id number]] Collected:  07/01/14 1303    Specimen Information:  Other / Bone Updated:  07/01/14 1927    Anaerobic culture  [[id number]] Collected:  07/01/14 1303    Specimen Information:  Other / Tissue Updated:  07/01/14 1923    CULTURE BLOOD AEROBIC AND ANAEROBIC [[id number]] Collected:  06/23/14 2124    Specimen Information:  Blood, Venipuncture Updated:  06/29/14 0121    Narrative:      ORDER#: [id number]                                    ORDERED BY: Candy Sledge  SOURCE: Blood, Venipuncture rue                      COLLECTED:  06/23/14 21:24  ANTIBIOTICS AT COLL.:                                RECEIVED :  06/23/14 23:00  Culture Blood Aerobic and Anaerobic        FINAL       06/29/14 01:21  06/29/14   No growth after 5 days of incubation.      CULTURE BLOOD AEROBIC AND ANAEROBIC [[id number]] Collected:  06/23/14 2055    Specimen Information:  Blood, Venipuncture Updated:  06/29/14 0022    Narrative:      ORDER#: [id number]                                    ORDERED BY: Candy Sledge  SOURCE: Blood, Venipuncture lue                      COLLECTED:  06/23/14 20:55  ANTIBIOTICS AT COLL.:                                RECEIVED :  06/23/14 22:09  Culture Blood Aerobic and Anaerobic        FINAL       06/29/14 00:21  06/29/14   No growth after 5 days of incubation.      Fungus culture [[id number]] Collected:  07/01/14 1303    Specimen Information:  Other / Bone Updated:  07/02/14 0845    Narrative:      ORDER#: [id number]  ORDERED BY: LIM, JAE  SOURCE: Bone right ethomoid bone                     COLLECTED:  07/01/14 13:03  ANTIBIOTICS AT COLL.:                                RECEIVED :  07/01/14 19:27  Stain, Fungal                              FINAL       07/02/14 08:45  07/02/14   No Fungal or Yeast Elements Seen  Culture Fungus                             PENDING      Fungus culture [[id number]] Collected:  07/01/14 1303    Specimen Information:  Other / Tissue Updated:  07/02/14 0845    Narrative:      ORDER#: [id number]                                    ORDERED BY: LIM, JAE  SOURCE: Tissue right  ethimoid                        COLLECTED:  07/01/14 13:03  ANTIBIOTICS AT COLL.:                                RECEIVED :  07/01/14 19:23  Stain, Fungal                              FINAL       07/02/14 08:45  07/02/14   No Fungal or Yeast Elements Seen  Culture Fungus                             PENDING      MRSA culture [[id number]] Collected:  07/01/14 2212    Specimen Information:  Body Fluid / Nares and Throat Updated:  07/02/14 0232    Wound culture & gram stain [[id number]] Collected:  07/01/14 1303    Specimen Information:  Wound / Abscess Updated:  07/02/14 1437    Narrative:      ORDER#: [id number]                                    ORDERED BY: LIM, JAE  SOURCE: Abscess right ethimoid                       COLLECTED:  07/01/14 13:03  ANTIBIOTICS AT COLL.:                                RECEIVED :  07/01/14 19:23  Stain, Gram                                FINAL  07/01/14 22:39  07/01/14   No WBCs or organisms seen  Culture and Gram Stain, Aerobic, Wound     PRELIM      07/02/14 14:37  07/02/14   Culture no growth to date, Final report to follow      Wound culture & gram stain [[id number]] Collected:  07/01/14 1303    Specimen Information:  Wound / Abscess Updated:  07/02/14 1436    Narrative:      ORDER#: [id number]                                    ORDERED BY: LIM, JAE  SOURCE: Abscess right ethomoid bone                  COLLECTED:  07/01/14 13:03  ANTIBIOTICS AT COLL.:                                RECEIVED :  07/01/14 19:27  Stain, Gram                                FINAL       07/01/14 20:35  07/01/14   No WBCs or organisms seen  Culture and Gram Stain, Aerobic, Wound     PRELIM      07/02/14 14:36  07/02/14   Culture no growth to date, Final report to follow            Rads:   No results found.    Signed by: Micheline Rough, MD

## 2014-07-02 NOTE — Progress Notes (Signed)
Surgery Daily Progress Note  Surgery Team Plastic Surgery    Date/Time: 07/02/2014 9:26 AM  Bed:  F304/F304.01  Hospital Day: 10  1 Day Post-Op  Primary Procedure:   1. Intracranial free tissue transfer reconstruction, using vastus  lateralis free-flap.  2. Complex closure of the scalp, 22 cm.  3. Reconstruction of scalp using Integra dermal matrix, measuring roughly  an area of 6 x 12 cm.  4. Nasal endoscopy by Dr. Dorinda Hill.   5. Placement of On-Q pump, left thigh    Interval History:   No acute issues overnight. Pain control much improved. Afebrile, no additional complaints    Diet: Diet NPO effective now     Active Lines, Drains and Airways     Name:   Placement date:   Placement time:   Site:   Days:    PICC Single Lumen 06/26/14 Right Basilic  06/26/14   1517   Basilic   5    OnQ Pump (Incisional) 07/01/14 left thigh Left  07/01/14      left thigh   1    Closed/Suction Drain Left Thigh Bulb 19 Fr.  07/01/14   1520   Thigh   less than 1    Closed/Suction Drain Right Scalp Bulb 15 Fr.  07/01/14   1646   Scalp   less than 1    Urethral Catheter Double-lumen;Latex 16 Fr.  07/01/14   0900   Double-lumen;Latex   1    Arterial Line 07/01/14 Left Radial  07/01/14   0916   Radial   1          Medications:     Current Facility-Administered Medications   Medication Dose Route Frequency   . aspirin  300 mg Rectal Daily   . cefepime  2 g Intravenous Q8H SCH   . enoxaparin  40 mg Subcutaneous Q24H   . gabapentin  300 mg Oral 6X Daily   . insulin glargine  43 Units Subcutaneous QHS   . metroNIDAZOLE  500 mg Intravenous Q8H SCH   . simvastatin  40 mg Oral QHS   . sodium chloride (PF)  10 mL Intracatheter Daily   . vancomycin  2,000 mg Intravenous Q12H     . bupivacaine 0.25% 2 mL/hr (07/01/14 1739)   . dextrose 5 % and 0.9% NaCl 75 mL/hr at 07/02/14 0845   . HYDROmorphone     . lactated ringers Stopped (07/01/14 1837)     dextrose, glucagon (rDNA), hydrALAZINE, HYDROmorphone, ibuprofen, insulin aspart, naloxone, oxyCODONE,  oxyCODONE     Physical Exam:   Current Vitals:   Filed Vitals:    07/02/14 0800   BP:    Pulse: 67   Temp: 98.6 F (37 C)   Resp: 9   SpO2: 94%     Vital signs in last 24 hours:Temp:  [97.8 F (36.6 C)-98.6 F (37 C)] 98.6 F (37 C)  Heart Rate:  [67-104] 67  Resp Rate:  [9-24] 9  BP: (110-164)/(58-86) 114/70 mmHg  Arterial Line BP: (156-179)/(83-92) 157/84 mmHg  Body mass index is 42.18 kg/(m^2).    Intake and Output Summary (Last 24 hours):  I/O last 3 completed shifts:  In: 8453.75 [I.V.:3253.75; IV Piggyback:5200]  Out: 9604 [VWUJW:1191; Drains:20; Blood:250]    PHYSICAL EXAM:  General:AAOx3, NAD  HEENT: scalp incision with staples in place, partially closed, minimal amt ss drainage, drain in place with minimal output, flaps with excellent arterial/venous doppler signals  CV: RRR, no murmurs, rubs, gallops  Pulm: CTAB, no wheezes, rhonci  Extremities: LLE with ACE wrap in place, dressing c/d/i, JP in place with minimal output, on-q in place  Neuro: Sensation intact throughout, CN 2-12 intact, gross motor intact      Labs:     Hematology   Recent Labs      07/01/14   1545   WBC  12.45*   HEMOGLOBIN  12.4*   HEMATOCRIT  36.7*   PLATELETS  224      Chemistry   Recent Labs      07/01/14   1545   SODIUM  135*   POTASSIUM  4.4   CHLORIDE  106   CO2  18*   BUN  13.0   CREATININE  0.9   GLUCOSE  204*   CALCIUM  7.8*      Coagulation   Recent Labs      06/30/14   0816   PT  14.2   PT INR  1.1      Liver Function Tests   Recent Labs      07/01/14   1545   AST (SGOT)  73*   ALT  99*   ALKALINE PHOSPHATASE  51   PROTEIN, TOTAL  6.5   ALBUMIN  3.9   BILIRUBIN, TOTAL  0.5        Cultures:  None     Rads:   Radiological Procedure reviewed.  No results found.      Assessment:   [name] Brown is a 48 y.o. male hx HTN, DM, brain abscess s/p craniotomy 09/2013 now w recurrent brain abscess POD 1 s/p procedure noted above    Plan:   Continue flap checks q1hr with continuous flow monitors  Continue q1hr neurovascular checks to  LLE; elevate  Avoid any compression to the forehead   - nasal canula is ok under the face/taped to cheeks; do not hang over ears  Continue asa 325 and lovenox 40qd  From prs standpoint; ok to advance diet  Continue abx per ID; currently vanc/cefepime/flagyl    M. Everlena Cooper, MD  General Surgery PGY-2  Pager: [phone]

## 2014-07-02 NOTE — Progress Notes (Signed)
POC    48 yo male s/p craniotomy, endoscopic schenoidotomy, and vastus lateralis free flap for recurrent brain abscess with anterior fossa defect.    Patient doing well, endorses pain in head and LLE with moderate control. Denies nausea.    Blood pressure 147/83, pulse 100, temperature 97.9 F (36.6 C), temperature source Oral, resp. rate 19, height 1.778 m (5\' 10" ), weight 133.358 kg (294 lb), SpO2 96 %.    Wounds: scalp incision c/d/i with staples over right and superior portions, left portion remains open with xeroform in place, mild serosanguinous drainage, drain in place with minimal ss output, flap with good a/v signals, LLE in ACE wrap with minimal staining, JP with ss output    Plan:  - q1 hour flap/neurovascular checks of LLE  - No compression over flap region  - NPO  - ASA, lovenox  - cefepime, flagyl, vanc    Barry Brunner

## 2014-07-02 NOTE — Op Note (Signed)
Procedure Date: 07/01/2014     Patient Type: I     SURGEON: Higinio Roger MD  ASSISTANT:       SURGEONS:    Vivia Budge, MD, with neurosurgery; Dorinda Hill, MD, with ENT; Higinio Roger,  MD, and Alyse Low, MD, with plastic surgery.       RESIDENT:    Dr. 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     TITLES OF PROCEDURES:  1.  Intracranial free tissue transfer reconstruction, using vastus  lateralis free-flap.  2.  Complex closure of the scalp, 22 cm.  3.  Reconstruction of scalp using Integra dermal matrix, measuring roughly  an area of 6 x 12 cm.  4.  Nasal endoscopy by Dr. Dorinda Hill.   5.  Placement of On-Q pump, left thigh.      ANESTHESIA:   General.      COMPLICATIONS:  None.       ESTIMATED BLOOD LOSS:  250 mL.     FLUIDS GIVEN:  One liter of colloids and 1500 crystalloids.     URINE OUTPUT:  2400 mL.     DRAINS:  One #15 drain in the scalp and one #19 in the leg.     SPECIMEN FROM PLASTIC SURGERY:   None.     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  The patient  presented with significant headaches and suspicion of recurrent infection  intracranially.  Plan had been made for debridement of the intracranial  process and reconstruction with a well-perfused viable tissue that can help  the area heal and hopefully help decrease the risk of recurrence of  infection.  The patient's options were either local scalp flap or a free  tissue transfer, and after a long discussion, patient had opted for free  tissue transfer.     DESCRIPTION OF  PROCEDURE:  The patient was brought into the operating room, placed in supine position.   After administration of adequate anesthesia, he was orotracheally  intubated.  Dr. Dorinda Hill initially performed a nasal endoscopy and  assessment of the sinuses.  Next, once Dr. Lawernce Pitts procedure had been  completed, a combined procedure with neurosurgery and plastic surgery  started.       Dr. Jaynie Collins performed the craniotomy and coronal incision with craniotomy, and  plastic surgery performed the harvest of the flap for plastic surgery.  The  skin incision was made in the thigh, and this was carried down through a  thick layer of fat down to the muscular fascia.  Muscular fascia was  divided, rectus femoris muscle was identified and retracted out of the way,  and vastus lateralis muscle was identified.  Carefully, blood supply to  this muscle was identified and dissected out tediously.  Once the size of  the defect was clarified and we knew how much muscle was needed, careful  dissection of a strip of muscle was performed, making sure that the blood  vessel was incorporated in this muscle  flap.  Roughly, a 20 to 22 cm  free-flap was raised, including muscle and a pedicle.  Once the  intracranial dissection was ready for our procedure, superficial temporal  artery vessels were dissected out and prepared for anastomosis.  The flap  was then divided, flushed out with saline, and taken to the scalp tissue.   Venous anastomosis was performed using a 3 mm coupler to the superficial  temporal vein and superficial temporal artery, which was roughly about 1.5  to 1.8 mm anastomosed-to-roughly 2.5 mm flap artery, using 9-0 nylon.   Excellent blood flow was obtained post removal of the clamps.  A flow  coupler had been used, so venous monitoring, as well as arterial  monitoring, with placement of the [employer] Doppler, were performed.       Dr. Jaynie Collins came back into the operating room, and the decision was made to  remove a little bit more  bone from the temporal bone.  The temporal muscle  was divided, and this would allow for comfortable lay of the flap, without  compression from the skin flap.  Once it was redraped, the remainder of the  craniotomy bone was then re-placed back.  Only a small amount of it had to  be excised, so it can limit the compression on the flap.  Again,  significant attention was paid to the compression on the flap.  Release of  the galea in the scalp flap was also made to allow for accommodation of  swelling, which will happen postoperatively, again continuously monitoring  the blood flow to the flap.  Dr. Jaynie Collins further treated the dura and the area  surrounding the leak.  Then the flap was placed immediately covering the  leak and the open defect of the sinus to the intracranial space.  Then the  flap was laid in that area.  Next, scalp skin was then redraped.  However,  the area directly over the flap was not reapproximated.  This is where  Integra dermal matrix was used, which was pie-crusted and then stapled in  place.  The remainder of the scalp closure was performed by Dr. Cherly Hensen using  2-0 Vicryl and staples, and then Xeroform was placed over the Integra.     Thigh was closed using 2-0 PDS, reapproximating the muscle layers of vastus  lateralis to the vastus intermedius.  Only a small strip of the vastus was  removed.  Although it was 10 to 12 cm long, it was probably only 4 to 5 cm  wide.  Next, 2-0 Vicryl was used to reapproximate the deep dermal sutures,  and 3-0 Monocryl was used to reapproximate dermal and then subcuticular  closure.  On-Q pump had been placed prior to closure using a percutaneous  approach and secured in place using Tegaderm.  A drain was placed using the  inferior stab incision and secured in place using 3-0 nylon.  At the end of  the procedure, Steri-Strips, gauze, Kerlix, and Ace wrap were placed in the  thigh, antibiotic ointment placed over the scalp incisions, and patient was  extubated and  taken to recovery room in stable condition.           D:  07/01/2014 17:48 PM by Dr. Higinio Roger, MD ([id number])  T:  07/02/2014 00:39 AM by [id number]      Everlean Cherry: [id number]) (Doc ID: [id number])

## 2014-07-02 NOTE — Progress Notes (Signed)
Pt admitted from PACU at 2000.  Neuro: A,Ox4. MAE. Following commands. Complaining of pain in left leg and head. Started on PCA dilaudid pump.   Resp: Remains on 3L NC with no desats.  CV: SR -ST. A line in place. SBP up to 170s with pain. Afebrile. Q1 flap checks done. Flap remains soft, pink, and warm. Continuous arterial and venous dopplers in place.  GI/GU: Foley w. AUOP. No BM. Remains NPO.

## 2014-07-02 NOTE — Addendum Note (Signed)
Addendum  created 07/02/14 1023 by Fortunato Curling, MD    Modules edited: Notes Section    Notes Section:  File: [id number]

## 2014-07-02 NOTE — Plan of Care (Signed)
[name] Brown is doing well. PCA Dilaudid dcd due to itching - started on PO pain  Medication & given 25mg . Benedryl with adequate results. Started on a diabetic dysphagia diet - taking PO fluids well. Adequate urinary output via the foley catheter. A/V impulses to flap site good; graft site with Ace bandage and Hemovac intact. Crani incision open and draining SSG fluid. On Q pump into graft site with good results. Pt is anxious but cooperative. Switched to SS insulin  Scale for [facility] & HS coverage. Wife at bedside and updated. VSS- afebrile. Neurologically  Intact. See flowsheets for additional data. Continue to monitor.

## 2014-07-02 NOTE — Progress Notes (Signed)
No acute events.  Felt well overnight, although some discomfort while coughing.    Filed Vitals:    07/02/14 0800   BP:    Pulse: 67   Temp: 98.6 F (37 C)   Resp: 9   SpO2: 94%       Intake/Output Summary (Last 24 hours) at 07/02/14 1610  Last data filed at 07/02/14 0800   Gross per 24 hour   Intake 8453.75 ml   Output   4805 ml   Net 3648.75 ml     Awake, Alert, appropriate  No pain  Incision over scalp CDI  Xeroform over integra, intact  JP drain serosanguinous in R scalp (10cc)  Drain in L leg 10cc  LLE wrapped with ACE, toes WWP, moving easily      Recent Labs  Lab 07/01/14  1545   WBC 12.45*   HEMOGLOBIN 12.4*   HEMATOCRIT 36.7*   PLATELETS 224     A/P:  POD#1 s/p vastus lateralis free flap to anterior skull base after craniotomy and washout.    - Pt would like to eat, OK to advance from my standpoint  - Keep HOB elevated  - OK to get OOB to chair   - Follow JP output  - Cont doppler monitoring q1hr  - No pressure to forehead or L temple  - Change xeroform daily  - Hep ppx    Please call with questions  Lukas Pelcher C. Cherly Hensen, MD  Pediatric and Craniofacial Plastic Surgery  [address]  [place], [place] [address]    [phone] (office)

## 2014-07-02 NOTE — Op Note (Signed)
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       TITLE OF PROCEDURE:   Craniotomy, Washout and drainage of the anterior skull base with a vastus  lateralis free flap from the left leg and complex closure measuring 22cm and Integra  placement of the scalp wound.       ANESTHESIA:   General endotracheal.     TOTAL IV FLUIDS:  1 liter albumin, 1500 mL of saline.     TOTAL URINE OUTPUT:  2400 mL.     ESTIMATED BLOOD LOSS:   250 mL.      DRAINS:  x1 scalp  x1 L leg      SPECIMENS:  Bone culture.     IMPLAINTS:  None.       DISPOSITION:   Stable and extubated to PACU.      INDICATIONS FOR PROCEDURE:   The patient is a 48 year old male with a history of prior sinus surgery  with chronic sinus headache and prior evidence of infection to his brain.   He had a prior craniotomy that drained this abscess at an outside institution.   However, after several years of discomfort and headache and revision  procedures, he had repeat imaging which was concerning for inflammation and  repeat communication with the sinus.  Therefore, he was good for operative  exploration washout and free flap reconstruction to separate the skull  base from the sinus cavity.       DESCRIPTION OF PROCEDURE:   On the day of operation the patient was brought to the operating room after  a timeout and identification was performed.  Dr. Dorinda Hill of the ENT  service performed endoscopic sinus evaluation and exploration of the cavity.  Intranasally, there was no obvious communication with the intracranial contents.  Subsequently  the patient was prepped and draped in the usual sterile fashion.  Dr. Vivia Budge then exposed the  bicoronal incision and the prior craniotomy site.  The  old titanium hardware from the cranioplasty was removed.  The bone flap was  very well healed and in stable position.  There was evidence of prior  craniotomy osteotomies.  Additionally, prior sinus obliteration plates were  found from a frontal sinus procedure; these titanium hardware and screws were also  removed at this time.  Dr. Holland Falling and I raised the vastus lateralis  flap of the left leg after meticulous dissection through subcutaneous  Tissue and the fascia, then was identified along with perforating branches from  the pedicle.  This was traced proximally to the feeding vessel descending from the lateral femoral circumflex and the  pedicle was isolated and dissected for a total length of 10 cm.  This  was done under loupe magnification with microsurgical technique.   Hemostasis was achieved throughout using Hemoclips and bipolar  electrocautery.  Once the brain was retracted, irrigated and dissected  completely from the skull base, Dr. Jaynie Collins was ready for [place] to insert our  flap.  We separated the pedicle from its origin branch point in the  Lateral circumflex femoral system.  The flap was then irrigated with heparinized  saline until the vessels were running clear.  The superficial temporal  artery and vein were then dissected at the level of the helical root.  These were  found to be approximately 1.5 and 3.0 mm in  size respectively.  Using a 3.0 mm flow coupler we were able to perform the  venous anastomosis from the vena comitantes to the superficial temporal vein.  This was  done under loupe magnification as well.  Subsequently we were able to  perform the microanastomosis using a 9-0 Nylon suture of the artery to the  superficial temporal artery.  After anastomosis the bulldog clamps were  removed and full pulsatile flow was restored.  A [employer] Doppler was placed over the  superficial temporal artery anastomosis for arterial monitoring.  After  retraction of the brain and dura, the defect on the right cranial base was  identified, the muscle was laid into position, the Duragen and fibrin glue  were sprayed above the dura but behind the flap so that the flap filled  the defect in entirety and approximated the dural layer.  A laterally based  bone window was made so that the pedicle would not be compromised and the  muscle base would not swell and cause constriction.  The anterior part of  the craniotomy bone was replaced without difficulty and with craniotomy hardware and 4  mm screws.  The wound was irrigated copiously and hemostasis was achieved  and maintained with bipolar electrocautery.  I then closed the scalp wound in a  complex fashion with 2-0 Vicryl sutures through the galea, 2-0 Vicryl  through the subdermis and stainless steel staples for the skin.  This length measured 22 cm in total.  A  19-French Blake drain was placed prior to closure in the subgaleal layer  exiting in the right postauricular region.       On the left side of the scalp the skin and tissue were too tight for direct  primary approximation as this caused vessel compromise and decreased perfusion,  therefore, this portion of the wound was left open and Integra silicone  sheeting was placed to temporize the wound and allow for secondary healing  for later closure.  The leg was closed with 2-0 Vicryl.  A 19 round Blake  drain was placed in the deep compartment.  The skin was reapproximated with  3-0 Monocryl and was dressed with Steri-Strips and an Ace wrap.  The scalp  was dressed with bacitracin and Xeroform.  At the end of the case all  needle, sponge and instrument counts were correct.  The patient had good  pulsatile flow and strong Doppler signals throughout both the venous and  arterial signals throughout.  He was then transported to the ICU for  postoperative monitoring.  Dr. Holland Falling and I were present for the  entirety of the case.           D:  07/02/2014  08:23 AM by Dr. Cristal Deer C. Cherly Hensen, MD ([id number])  T:  07/02/2014 16:18 PM by       Everlean Cherry: [id number]) (Doc ID: [id number])

## 2014-07-02 NOTE — Anesthesia Postprocedure Evaluation (Signed)
Anesthesia Post Evaluation    Patient: [name] Brown    Procedures performed: Procedure(s) with comments:  CRANIOTOMY, REMOVAL TUMOR - RE-EXPLOR BI-FRONTAL CRANIOTOMY, ABSCESS RESECTION     NO BRAIN LAB  ENDOSCOPIC SINUS sphenoidotomy with navigation - Scope  RECONSTRUCTION, FREE FLAP MAJOR - FREE FLAP MUSCLE    Anesthesia type: General ETT    Patient location:ICU    Last vitals:   Filed Vitals:    07/02/14 0800   BP:    Pulse: 67   Temp: 37 C (98.6 F)   Resp: 9   SpO2: 94%       Post pain: Patient not complaining of pain, continue current therapy      Mental Status:awake and alert     Respiratory Function: tolerating room air    Cardiovascular: stable    Nausea/Vomiting: patient not complaining of nausea or vomiting    Hydration Status: adequate    Post assessment: no apparent anesthetic complications, no reportable events and no evidence of recall

## 2014-07-02 NOTE — Progress Notes (Signed)
Pt is awake and alert   C/o boredom    Af vss    Flap with great A and V signal    Decreased drainage from scalp wound  Xeroform intact    A/p pod 1  Cont present care  May be OOB tomorrow, keep in bed tonight    [name], MD [facility]  Plastic Surgery, [phone]

## 2014-07-03 ENCOUNTER — Inpatient Hospital Stay: Payer: BLUE CROSS/BLUE SHIELD

## 2014-07-03 ENCOUNTER — Encounter: Payer: Self-pay | Admitting: Neurological Surgery

## 2014-07-03 DIAGNOSIS — E1169 Type 2 diabetes mellitus with other specified complication: Secondary | ICD-10-CM

## 2014-07-03 LAB — GLUCOSE WHOLE BLOOD - POCT
Whole Blood Glucose POCT: 132 mg/dL — ABNORMAL HIGH (ref 70–100)
Whole Blood Glucose POCT: 122 mg/dL — ABNORMAL HIGH (ref 70–100)
Whole Blood Glucose POCT: 164 mg/dL — ABNORMAL HIGH (ref 70–100)
Whole Blood Glucose POCT: 141 mg/dL — ABNORMAL HIGH (ref 70–100)

## 2014-07-03 LAB — CBC
Hematocrit: 36.6 % — ABNORMAL LOW (ref 42.0–52.0)
Hgb: 11.9 g/dL — ABNORMAL LOW (ref 13.0–17.0)
MCH: 30 pg (ref 28.0–32.0)
MCHC: 32.5 g/dL (ref 32.0–36.0)
MCV: 92.2 fL (ref 80.0–100.0)
MPV: 10.1 fL (ref 9.4–12.3)
Nucleated RBC: 0 /100 WBC (ref 0–1)
Platelets: 244 10*3/uL (ref 140–400)
RBC: 3.97 10*6/uL — ABNORMAL LOW (ref 4.70–6.00)
RDW: 14 % (ref 12–15)
WBC: 12.16 10*3/uL — ABNORMAL HIGH (ref 3.50–10.80)

## 2014-07-03 LAB — BASIC METABOLIC PANEL
BUN: 11 mg/dL (ref 9.0–28.0)
CO2: 25 mEq/L (ref 22–29)
Calcium: 8.2 mg/dL — ABNORMAL LOW (ref 8.5–10.5)
Chloride: 107 mEq/L (ref 100–111)
Creatinine: 0.8 mg/dL (ref 0.7–1.3)
Glucose: 154 mg/dL — ABNORMAL HIGH (ref 70–100)
Potassium: 4 mEq/L (ref 3.5–5.1)
Sodium: 140 mEq/L (ref 136–145)

## 2014-07-03 LAB — RED BLOOD CELLS OR HOLD

## 2014-07-03 LAB — VANCOMYCIN, TROUGH
Vancomycin Time of Last Dose: 69070271204
Vancomycin Trough: 19.8 ug/mL (ref 10.0–20.0)

## 2014-07-03 LAB — MAGNESIUM: Magnesium: 2.1 mg/dL (ref 1.6–2.6)

## 2014-07-03 LAB — PHOSPHORUS: Phosphorus: 2 mg/dL — ABNORMAL LOW (ref 2.3–4.7)

## 2014-07-03 LAB — GFR: EGFR: >60

## 2014-07-03 MED ORDER — OXYCODONE HCL ER 10 MG PO T12A
10.0000 mg | EXTENDED_RELEASE_TABLET | Freq: Two times a day (BID) | ORAL | Status: DC
Start: 2014-07-03 — End: 2014-07-08
  Administered 2014-07-03 – 2014-07-08 (×10): 10 mg via ORAL
  Filled 2014-07-03 (×10): qty 1

## 2014-07-03 MED ORDER — INSULIN GLARGINE 100 UNIT/ML SC SOLN
38.0000 [IU] | Freq: Every evening | SUBCUTANEOUS | Status: DC
Start: 2014-07-03 — End: 2014-07-08
  Administered 2014-07-03 – 2014-07-07 (×5): 38 [IU] via SUBCUTANEOUS
  Filled 2014-07-03: qty 38

## 2014-07-03 MED ORDER — METFORMIN HCL 500 MG PO TABS
1000.0000 mg | ORAL_TABLET | Freq: Two times a day (BID) | ORAL | Status: DC
Start: 2014-07-03 — End: 2014-07-08
  Administered 2014-07-03 – 2014-07-08 (×11): 1000 mg via ORAL
  Filled 2014-07-03 (×11): qty 2

## 2014-07-03 MED ORDER — ACETAMINOPHEN 500 MG PO TABS
1000.0000 mg | ORAL_TABLET | Freq: Four times a day (QID) | ORAL | Status: AC
Start: 2014-07-03 — End: 2014-07-04
  Administered 2014-07-03 – 2014-07-04 (×4): 1000 mg via ORAL
  Filled 2014-07-03 (×5): qty 2

## 2014-07-03 MED ORDER — SODIUM CHLORIDE 0.9 % IV SOLN
15.0000 mmol | Freq: Once | INTRAVENOUS | Status: AC
Start: 2014-07-03 — End: 2014-07-03
  Administered 2014-07-03: 15 mmol via INTRAVENOUS
  Filled 2014-07-03: qty 5

## 2014-07-03 MED ORDER — DIAZEPAM 5 MG PO TABS
5.0000 mg | ORAL_TABLET | Freq: Every evening | ORAL | Status: DC | PRN
Start: 2014-07-03 — End: 2014-07-08
  Administered 2014-07-03 – 2014-07-07 (×3): 5 mg via ORAL
  Filled 2014-07-03 (×3): qty 1

## 2014-07-03 MED ORDER — DIAZEPAM 5 MG PO TABS
5.0000 mg | ORAL_TABLET | Freq: Once | ORAL | Status: AC
Start: 2014-07-03 — End: 2014-07-03
  Administered 2014-07-03: 5 mg via ORAL
  Filled 2014-07-03: qty 1

## 2014-07-03 NOTE — Progress Notes (Signed)
Pt is doing well,    Worried about L side face swelling    Has been up in chair for quite a while  Foley is out and he has voided    CT scan done and showed postop changes    Af Vss  Good u/o  Drain with low output    Flap- good a and v signals  Increased swelling L side face, temporal scalp, eyelids, etc  Facial nerve intact  Skin over pedicle is soft and swelling appears reasonable (increased as c/o yesterday)    A/p POD 2  Swelling is not surprising and c/w being up most of day. Incision over scalp was left open in anticipation of swelling.  I have changed the continous monitor to venous signal as it is more sensitive to impacts of swelling.  Cont ICU management in light of recent findings.    [name], MD [facility]  Plastic Surgery, [phone]

## 2014-07-03 NOTE — Progress Notes (Addendum)
Attending Attestation:     I have personally seen and examined [name] Brown and I have reviewed the notes, assessments, and/ or procedures by dr Tanda Rockers, I concur with his/her documentation of [name] Brown.    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, who acutely recurred underwent craniotomy and endoscopic sphenoidectomy and free flap reconstruction with left vastus lateralis procedure- now postop day 2, patient continues to do well, neurologically intact, and going for a repeat head CT- pain controlled on PO meds with adjustments, patient otherwise on Valium PRN for anxiety, continued antibiotics per ID- history of chronic Rocephin now on vanc and cefipime and Flagyl-cultures pending but currently negative- patient with JP drain in place with serosanguineous- otherwise he is HD stable, pulmonary stable, tolerating diabetic die,t will adjust back to home insulin, stop D5 IV fluids and [place] Foley catheter awaiting void, discuss with plastics- will go ahead and ambulate and possible transfer out to step down later today- good signals in flap, no current issues otherwise, on Lovenox    Exclusive of time for Resident/ Student education and for separate procedures-  Total Critical Care Time spent NO minutes.    Abagail Kitchens, MD, FACS    Abagail Kitchens, M.D., F.A.C.S. Philis Kendall [phone])  Attending Surgeon, [facility]  General and Acute Care Surgery, Trauma and Surgical Critical Care  Board Certified in Surgery and in Surgical Critical Care  and Subspecialty Certified in Neurocritical Care        ICU ACUTE CARE SURGERY / TRAUMA PROGRESS NOTE     Date/Time: 07/03/2014 7:29 AM  Patient Name: [facility]  Primary Care Physician: Tressa Busman, MD  Hospital Day: 10  Procedure(s):  CRANIOTOMY, REMOVAL TUMOR  ENDOSCOPIC SINUS sphenoidotomy with navigation  RECONSTRUCTION, FREE FLAP MAJOR   Post-op Day: 2 Days  Post-Op    Assessment/Plan:   This patient is critically ill and requiring ICU level of care due to Risk of life or limb.    The patient has the following active problems:  Patient Active Problem List   Diagnosis   . Brain abscess   . Type 2 diabetes mellitus   . Chronic headache   . OSA (obstructive sleep apnea)   . HLD (hyperlipidemia)   . Sinusitis   . HTN (hypertension)       Plan by systems:  Neuro: Pain control with PO meds; drain management per NSGY; repeat head CT today. No current plans for return to OR.  Seizure Prophylaxis not indicated  Pulm: No acute issues, extubated post-op without complication  CV: No acute issues, hemodynamically stable  Endo: Glucose <200, continue current insulin regimen (lantus and SSI)  GI: Toelrating PO, advance diet today  GI Prophylaxis:  No prophylaxis need, patient is on a diet   Heme/ID: On abx regimen per ID recs, including vancomycin, flagy, and cefepime., Will continue to follow recs.  DVT Prophylaxis: enoxaparin 40 Daily  Renal: No issues, good UOP. Likely [place] Foley today.  Foley: yes  Neuromuscular:  Weight Bearing Right Left   Upper Extremity WBAT WBAT   Lower Extremity WBAT WBAT   PT/OT: no  Psych: Valium at night for anxiety  Wounds: cranial wounds per NSGY and plastics    Neurosurgery - [place], Plastics - [place] and ID - [place]    Interval History:   [name] Brown is a 48 y.o. male who presents to the hospital after Other: Cerebral/cribiriform abscess.  Significant overnight events include None. For head CT today. [facility]ics still requesting Q1h flap checks as of yesterday.    Allergies:     Allergies   Allergen Reactions   . Morphine    . Penicillins        Medications:     Current Facility-Administered Medications   Medication Dose Route Frequency Provider Last Rate Last Dose   . aspirin tablet 325 mg  325 mg Oral Daily Tanda Rockers, Ty B, MD   325 mg at 07/02/14 1130   . bupivacaine (MARCAINE) 0.25 % 550 mL in ON Q PUMP  2 mL/hr Percutaneous Continuous  Higinio Roger, MD 2 mL/hr at 07/01/14 1739 2 mL/hr at 07/01/14 1739   . cefepime (MAXIPIME) 2 g in sodium chloride 0.9 % 100 mL IVPB mini-bag plus  2 g Intravenous [facility] Guadalupe Maple, MD 200 mL/hr at 07/03/14 0542 2 g at 07/03/14 0542   . dextrose  5 % and 0.9 % NaCl infusion   Intravenous Continuous Barry Brunner, MD 75 mL/hr at 07/03/14 0600     . dextrose 50 % bolus 25 mL  25 mL Intravenous PRN Atnafu, Belay W, MD       . diazepam (VALIUM) tablet 5 mg  5 mg Oral QHS PRN Rhea Belton B, MD   5 mg at 07/03/14 0206   . diphenhydrAMINE (BENADRYL) capsule 25 mg  25 mg Oral Q6H PRN Balinda Quails Man, MD   25 mg at 07/02/14 2102   . enoxaparin (LOVENOX) syringe 40 mg  40 mg Subcutaneous Q24H Atnafu, Belay W, MD   40 mg at 07/02/14 1700   . gabapentin (NEURONTIN) capsule 300 mg  300 mg Oral 6X Daily Gardiner Sleeper, MD   300 mg at 07/03/14 0601   . glucagon (rDNA) (GLUCAGEN) injection 1 mg  1 mg Intramuscular PRN Atnafu, Belay W, MD       . hydrALAZINE (APRESOLINE) injection 10 mg  10 mg Intravenous Q6H PRN Gardiner Sleeper, MD   10 mg at 07/01/14 2022   . HYDROmorphone (DILAUDID) injection 1 mg  1 mg Intravenous Q4H PRN Gardiner Sleeper, MD   1 mg at 07/03/14 0027   . ibuprofen (ADVIL,MOTRIN) tablet 400 mg  400 mg Oral Q6H PRN Rayna Sexton, MD   400 mg at 07/03/14 0050   . insulin aspart (NovoLOG) injection 1-5 Units  1-5 Units Subcutaneous TID AC PRN Rafiq, Nila, MD   1 Units at 07/02/14 2132   . insulin glargine (LANTUS) injection 43 Units  43 Units Subcutaneous QHS Barry Brunner, MD   43 Units at 07/02/14 0930   . lactated ringers infusion   Intravenous Continuous Fortunato Curling, MD   Stopped at 07/01/14 1837   . metroNIDAZOLE (FLAGYL) 500mg  in NS IVPB (premix)  500 mg Intravenous [facility] Guadalupe Maple, MD 100 mL/hr at 07/03/14 0207 500 mg at 07/03/14 0207   . oxyCODONE (OxyCONTIN) 12 hr tablet 10 mg  10 mg Oral Q12H PRN Gardiner Sleeper, MD   10 mg at 07/02/14 1608   . oxyCODONE  (ROXICODONE) immediate release tablet 15 mg  15 mg Oral Q3H PRN Gardiner Sleeper, MD   15 mg at 07/03/14 [phone]   . simvastatin (ZOCOR) tablet 40 mg  40 mg Oral QHS Gardiner Sleeper, MD   40 mg at 07/02/14 2101   . sodium chloride (PF) 0.9 % flush 10 mL  10 mL Intracatheter Daily  Otilio Miu, MD   10 mL at 07/02/14 1000   . vancomycin (VANCOCIN) 1,750 mg in sodium chloride 0.9 % 500 mL IVPB  1,750 mg Intravenous Q8H [facility] Nichols, Ty B, MD 250 mL/hr at 07/03/14 0542 1,750 mg at 07/03/14 0542       Labs:     Results     Procedure Component Value Units Date/Time    RBC OR HOLD Blood Product [[id number]] Collected:  06/30/14 0817     RBC Leukoreduced [id number]    released Updated:  07/03/14 0201     RBC Leukoreduced [id number]    released     MRSA culture [[id number]] Collected:  07/01/14 2212    Specimen Information:  Body Fluid / Nasal/Throat ASC Admission Updated:  07/02/14 2340    Narrative:      ORDER#: [id number]                                    ORDERED BY: Daphane Shepherd, MEREDITH  SOURCE: Nares and Throat                             COLLECTED:  07/01/14 22:12  ANTIBIOTICS AT COLL.:                                RECEIVED :  07/02/14 02:32  Culture MRSA Surveillance                  FINAL       07/02/14 23:40  07/02/14   Negative for Methicillin Resistant Staph aureus from Nares and             Negative for Methicillin Resistant Staph aureus from Throat      Glucose Whole Blood - POCT [[id number]]  (Abnormal) Collected:  07/02/14 2324     POCT - Glucose Whole blood 136 (H) mg/dL Updated:  47/42/59 5638    Glucose Whole Blood - POCT [[id number]]  (Abnormal) Collected:  07/02/14 2110     POCT - Glucose Whole blood 153 (H) mg/dL Updated:  18/84/16 6063    Glucose Whole Blood - POCT [[id number]]  (Abnormal) Collected:  07/02/14 1707     POCT - Glucose Whole blood 147 (H) mg/dL Updated:  35/57/32 2025    Wound culture & gram stain [[id number]] Collected:  07/01/14 1303    Specimen Information:  Wound / Abscess Updated:   07/02/14 1437    Narrative:      ORDER#: [id number]                                    ORDERED BY: LIM, JAE  SOURCE: Abscess right ethimoid                       COLLECTED:  07/01/14 13:03  ANTIBIOTICS AT COLL.:                                RECEIVED :  07/01/14 19:23  Stain, Gram  FINAL       07/01/14 22:39  07/01/14   No WBCs or organisms seen  Culture and Gram Stain, Aerobic, Wound     PRELIM      07/02/14 14:37  07/02/14   Culture no growth to date, Final report to follow      Wound culture & gram stain [[id number]] Collected:  07/01/14 1303    Specimen Information:  Wound / Abscess Updated:  07/02/14 1436    Narrative:      ORDER#: [id number]                                    ORDERED BY: LIM, JAE  SOURCE: Abscess right ethomoid bone                  COLLECTED:  07/01/14 13:03  ANTIBIOTICS AT COLL.:                                RECEIVED :  07/01/14 19:27  Stain, Gram                                FINAL       07/01/14 20:35  07/01/14   No WBCs or organisms seen  Culture and Gram Stain, Aerobic, Wound     PRELIM      07/02/14 14:36  07/02/14   Culture no growth to date, Final report to follow      IgG 1 [[id number]] Collected:  06/30/14 0816    Specimen Information:  Blood Updated:  07/02/14 1433     IgG Subclass 1 841 mg/dL     IgG 4 [[id number]]  (Abnormal) Collected:  06/30/14 0816    Specimen Information:  Blood Updated:  07/02/14 1433     IgG Subclass 4 112.6 (H) mg/dL     Glucose Whole Blood - POCT [[id number]]  (Abnormal) Collected:  07/02/14 1224     POCT - Glucose Whole blood 178 (H) mg/dL Updated:  24/40/10 2725    AFB culture and smear [[id number]] Collected:  07/01/14 1303    Specimen Information:  Sputum / Tissue Updated:  07/02/14 0854    Narrative:      ORDER#: [id number]                                    ORDERED BY: LIM, JAE  SOURCE: Tissue skull (right ethimoid bone)           COLLECTED:  07/01/14 13:03  ANTIBIOTICS AT COLL.:                                RECEIVED :   07/01/14 19:27  Stain, Acid Fast                           FINAL       07/02/14 08:54  07/02/14   No Acid Fast Bacillus Seen  Culture Acid Fast Bacillus (AFB)           PENDING      AFB culture and smear [[id number]] Collected:  07/01/14 1303    Specimen Information:  Sputum /  Tissue Updated:  07/02/14 0854    Narrative:      ORDER#: [id number]                                    ORDERED BY: LIM, JAE  SOURCE: Tissue right Ethimoid                        COLLECTED:  07/01/14 13:03  ANTIBIOTICS AT COLL.:                                RECEIVED :  07/01/14 19:23  Stain, Acid Fast                           FINAL       07/02/14 08:54  07/02/14   No Acid Fast Bacillus Seen  Culture Acid Fast Bacillus (AFB)           PENDING      Fungus culture [[id number]] Collected:  07/01/14 1303    Specimen Information:  Other / Bone Updated:  07/02/14 0845    Narrative:      ORDER#: [id number]                                    ORDERED BY: LIM, JAE  SOURCE: Bone right ethomoid bone                     COLLECTED:  07/01/14 13:03  ANTIBIOTICS AT COLL.:                                RECEIVED :  07/01/14 19:27  Stain, Fungal                              FINAL       07/02/14 08:45  07/02/14   No Fungal or Yeast Elements Seen  Culture Fungus                             PENDING      Fungus culture [[id number]] Collected:  07/01/14 1303    Specimen Information:  Other / Tissue Updated:  07/02/14 0845    Narrative:      ORDER#: [id number]                                    ORDERED BY: LIM, JAE  SOURCE: Tissue right ethimoid                        COLLECTED:  07/01/14 13:03  ANTIBIOTICS AT COLL.:                                RECEIVED :  07/01/14 19:23  Stain, Fungal                              FINAL  07/02/14 08:45  07/02/14   No Fungal or Yeast Elements Seen  Culture Fungus                             PENDING      Glucose Whole Blood - POCT [[id number]]  (Abnormal) Collected:  07/02/14 0830     POCT - Glucose Whole blood 163 (H) mg/dL  Updated:  40/98/11 9147          Rads:   Radiological Procedure reviewed.    Radiology Results (24 Hour)     ** No results found for the last 24 hours. **          Physical Exam:   Temp:  [98.6 F (37 C)-98.9 F (37.2 C)] 98.8 F (37.1 C)  Heart Rate:  [67-86] 75  Resp Rate:  [8-36] 17  BP: (101-167)/(56-84) 119/71 mmHg      Invasive ICU Hemodynamics:            Art Line (2)  Arterial Line BP 2: 134/83 mmHg (07/03/14 0600)  Arterial Line MAP (mmHg): 100 mmHg (07/03/14 0600)  ABP 2 Pulse Rate: 74 mmHg (07/03/14 0600)    Vital Signs:  Filed Vitals:    07/03/14 0600   BP: 119/71   Pulse: 75   Temp:    Resp: 17   SpO2: 99%       Vent Settings:             I/O:  Intake and Output Summary (Last 24 hours) at Date Time  I/O last 3 completed shifts:  In: 11548.75 [P.O.:1245; I.V.:4003.75; IV Piggyback:6300]  Out: 3480 [Urine:3380; Drains:100]    Output:     Closed/Suction Drain Left Thigh Bulb 19 Fr.-Output (mL): 0 mL (07/03/14 0542)  Closed/Suction Drain Right Scalp Bulb 15 Fr.-Output (mL): 20 mL (07/03/14 0600)     Urethral Catheter Double-lumen;Latex 16 Fr.-Output (mL): 125 mL (07/03/14 0600)                           Nutrition:   Orders Placed This Encounter   Procedures   . Diet dysphagia ADVANCED Liquid consistency:: Nectar Thick; Additional restrictions:: Consistent carbohydrate       Physical Exam:  Physical Exam   Constitutional: He is oriented to person, place, and time. He appears well-developed and well-nourished. No distress.   HENT:   Large surgical wound encompassing superior aspect of head, closed with sutures, JP drain from right temporal area with small maount of serosang fluid.  Right temporal area open with xeroform dressing.  No surrounding erythema and no drainage form wound     Eyes: Conjunctivae are normal. Pupils are equal, round, and reactive to light.   Neck: Normal range of motion. Neck supple. No tracheal deviation present.   Cardiovascular: Normal rate, regular rhythm, normal heart sounds and  intact distal pulses.    Pulmonary/Chest: Effort normal and breath sounds normal. No respiratory distress.   Abdominal: Soft. He exhibits no distension. There is no tenderness.   Musculoskeletal: Normal range of motion. He exhibits no tenderness.   Neurological: He is alert and oriented to person, place, and time.   Skin: Skin is warm and dry.   Surgical wound as describe in HENT   Psychiatric: He has a normal mood and affect. His behavior is normal.     Rhea Belton MD  [facility]  PGY-3

## 2014-07-03 NOTE — Progress Notes (Signed)
Events noted from chart review. Patient with temporary visual "floater" in L field, but now says that it lasted only 30-40 minutes.  Had headaches which have resolved.   Planned for CT today per Dr. Jaynie Collins.    Otherwise pain well controlled with medication.  Would like to have better sleep.    Filed Vitals:    07/03/14 0600   BP: 119/71   Pulse: 75   Temp:    Resp: 17   SpO2: 99%       Intake/Output Summary (Last 24 hours) at 07/03/14 0917  Last data filed at 07/03/14 0600   Gross per 24 hour   Intake   4920 ml   Output   1975 ml   Net   2945 ml     Exam:  Well appearing  Appropriate  EOMI  No visual aura or floaters  Incision c/d/i  Less swelling overall  Integra/open area with less drainage, contracting  Scalp drain serosanguinous  Doppler biphasic and strong  LLE with thigh incision c/d/i  ACE wrap bunched up and falling off.      Recent Labs  Lab 07/01/14  1545   WBC 12.45*   HEMOGLOBIN 12.4*   HEMATOCRIT 36.7*   PLATELETS 224     A/P:  Doing well POD#2 s/p vastus lateralis free flap to skull base    -Cont HOB elevation  -CT today  -ACE wrap removed, no longer needed, leave open to air  -Will d/c scalp drain tomorrow  -Cont doppler monitoring, can go to Q2 hr this PM  -OK to OOB to chair  -OK to d/c foley    Please call with questions.  Shell Blanchette C. Cherly Hensen, MD  Pediatric and Craniofacial Plastic Surgery  [address], [address]  [place], [place] [address]    [phone] (office)

## 2014-07-03 NOTE — Progress Notes (Addendum)
[facility] HOSPITALIST PROGRESS NOTE    Date Time: 07/03/2014 3:51 PM  Patient Name: [name]  Attending Physician: Balinda Quails Man, MD    Assessment:   1. Brain abscess with anterior fossa defect  2. 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 Extensive history of recurrent sinusitis  4. DM  5. OSA  6. HTN  7. HLD  8. Chronic headache  9. Hypophosphatemia     Plan:    Underwent CRANIOTOMY, evacuation of abscess,  endoscopic sinus sphenoidotomy with navigation and reconstruction with Neuro/plastic/ENT team on 07/01/14. Will Continue with vancomycin, flagyl and cefepime (# 11). Continue Cefepime anf Flagyl for a 4 week course. ID might [place] Vanco in AM. Continue pain management. Wound care as per surgery team. neuro check Q 1 hour. Had PICC already. Blood cultures--> NGTD. Wound culture--> NGTD. AFB and fungal cultures--> pending. 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 Extensive bony dehiscence medial wall right orbit. Extensive bony dehiscence right cribriform plate. Small area of bony dehiscence superomedial aspect of right orbit. F/U surgery and ENT teams. Repeat CT head on 9/7--> postsurgical changes, no hydrocephalus.F/U Plastic surgery, ID, ENT, and NS teams. Activity as tolerated to OOB   DM- optimally controlled controlled. On lantus- 38 units and Metformin 1000 gram PO BID (used to get 86 units Sq daily along with Metformin 1000 mg PO BID just before surgery) and Novolog SS. Recent HbA1c- 6.3.    On home CPAP   Continue statin and ASA   Hypophosphatemia- K-phos 15 mm mol IV today, will check electrolytes in AM, replace it PRN    Case discussed with: pt and NS team/RN    Safety Checklist:     DVT prophylaxis:  CHEST guideline (See page [id number]) Mechanical and chemical    Foley:  [place] Rn Foley protocol Not present   IVs:  Peripheral IV   PT/OT: Not needed   Daily CBC & or Chem ordered:  SHM/ABIM guidelines (see #5) No       Lines:     Active PICC Line / CVC Line / PIV Line / Drain / Airway / Intraosseous  Line / Epidural Line / ART Line / Line / Wound / Pressure Ulcer / NG/OG Tube     Name:   Placement date:   Placement time:   Site:   Days:    Peripheral IV 06/23/14 Left Forearm  06/23/14      Forearm   1           Disposition:     Today's date: 07/03/2014  Length of Stay: 10  Anticipated medical stability for discharge: 1-2 days   Reason for ongoing hospitalization: brain abscess   Anticipated discharge needs: TBD    Subjective     CC: Brain abscess    Interval History/24 hour events/subjective: s/p surgery- POD 2, pain- worse today, getting pain medication PRN. No fever, chills.,vomiting, focal weakness, seziures, and SOB/chest pain.        HPI: [name] Brown is a 48 y.o. male w/ h/o DM type 2, chronic headache, OSA, HLD, sinusitis, HTN, brain abscess s/p craniotomy, iv Rocephin for MSSA who p/w recurrent brain abscess on recent MRI. Pt reports bilateral retroorbital headaches weekly at around 2-3am which wake him up. Pt reports intermittent photophobia but denies visual changes or N/V. Pt denies F/C/D/SOB/CP/abd pain or any other sx's. Pt was admitted to NSICU on 09/30/13, d/c'ed to rehab on 10/07/14. Pt was d/c'ed home on 10/18/13.    Review of Systems:  See above    Physical Exam:     VITAL SIGNS PHYSICAL EXAM   Temp:  [98 F (36.7 C)-98.9 F (37.2 C)] 98 F (36.7 C)  Heart Rate:  [67-86] 84  Resp Rate:  [8-36] 17  BP: (101-156)/(57-78) 129/66 mmHg  Blood Glucose:    Telemetry: NSR      Intake/Output Summary (Last 24 hours) at 07/03/14 1551  Last data filed at 07/03/14 1400   Gross per 24 hour   Intake   4690 ml   Output   2690 ml   Net   2000 ml    General: No apparent distress, awake, alert, comfortable appearing, vital signs noted. Incision over scalp- clean and dry with JP drain in right side.   Pupils- reactive, equal, no pallor, no icterus  Neck- supple, no mass, no bruits/JVP   Resp: Clear to ausculation, no wheezing, rhonci, or rales noted; normal respiratory effort   CV: Regular rate and rhythm, no  murmurs, gallops, or rubs; no peripheral edema   GI: Abdomen obese, soft, non-tender, no masses; no hepatosplenomegaly   Skin: Normal temperature, tone, turgor; no rash, lesions, ulcers   Musc: No digital cyanosis or ischemia   Neuro: AOx3, CN 2-12 grossly intact, power 5/5 allover, sensation intact, DTR +1         Meds:     Medications were reviewed:    Labs:     Labs (last 72 hours):      Recent Labs  Lab 07/03/14  1341 07/01/14  1545   WBC 12.16* 12.45*   HEMOGLOBIN 11.9* 12.4*   HEMATOCRIT 36.6* 36.7*   PLATELETS 244 224         Recent Labs  Lab 06/30/14  0816   PT 14.2   PT INR 1.1      Recent Labs  Lab 07/03/14  1341 07/01/14  1545   SODIUM 140 135*   POTASSIUM 4.0 4.4   CHLORIDE 107 106   CO2 25 18*   BUN 11.0 13.0   CREATININE 0.8 0.9   CALCIUM 8.2* 7.8*   ALBUMIN  --  3.9   PROTEIN, TOTAL  --  6.5   BILIRUBIN, TOTAL  --  0.5   ALKALINE PHOSPHATASE  --  51   ALT  --  99*   AST (SGOT)  --  73*   GLUCOSE 154* 204*                   Microbiology, reviewed and are significant for:  Microbiology Results     Procedure Component Value Units Date/Time    CULTURE BLOOD AEROBIC AND ANAEROBIC [[id number]] Collected:  06/23/14 2124    Specimen Information:  Blood, Venipuncture Updated:  06/23/14 2300    Narrative:      1 BLUE+1 PURPLE    CULTURE BLOOD AEROBIC AND ANAEROBIC [[id number]] Collected:  06/23/14 2055    Specimen Information:  Blood, Venipuncture Updated:  06/23/14 2209    Narrative:      1 BLUE+1 PURPLE          Imaging, reviewed and are significant for:  Radiology Results (24 Hour)     Procedure Component Value Units Date/Time    CT Head WO Contrast [[id number]] Collected:  07/03/14 1057    Order Status:  Completed Updated:  07/03/14 1105    Narrative:      Clinical history: Status post surgery.    Comments: Unenhanced CT scan of the brain was performed.    There  is postsurgical change with evidence of prior frontal craniotomy  and left frontal craniectomy. Radiodense material over the left frontal  lobe  anteriorly measuring up to 2.1 cm in thickness is likely a  combination of a flap and blood products. There is small amount of  postoperative pneumocephalus. There is encephalomalacic change and/or  edema in the right frontal lobe, similar to 06/20/2014 MRI. There is no  hydrocephalus. There is mild mass effect with mild posterior  displacement of the frontal horns of the lateral ventricles.      Impression:       Postsurgical changes as above.    Melody Haver, MD   07/03/2014 11:01 AM              Signed by: Lucillie Garfinkel, MD

## 2014-07-03 NOTE — Progress Notes (Signed)
NUTRITION:  Reason for Assessment: Length of stay    Recommend:  Continue Consistent Carb diet as ordered.   Rec Juven BID to assist with wound healing; will order.      Clinical Update:  [name] Brown is a(n) 48 y.o. male with brain abscess s/p craniotomy, tumor removal, endoscopic sinus sphenoidotomy, and major free flap reconstruction. History of DM2 (A1c - 6.3%), chronic headaches, OSA, HLD, and HTN. Patient to have repeat head CT today.  Patient tolerating diet, which was advanced today.    Labs: Na - 135, Ca - 7.8, AST - 73, ALT - 99, POC Glucose 132-244  Meds: Lantus    Anthropometrics:  Height: 177.8 cm (5\' 10" )  Weight: 133.358 kg (294 lb)  Body mass index is 42.18 kg/(m^2).   Ideal Body Weight: 75 kg    Diet / Nutrition Support Order:   Orders Placed This Encounter   Procedures   . Diet consistent carbohydrate       Nutrition Goals:    1700-1900 kcals (23-25 kcals/kg IBW)   150-165 gm protein (2-2.2 gm/kg IBW)   2000-2200 ml fluids (27-30 ml/kg IBW)    Nutrition Diagnosis:   Intake Increased protein needs related to wound healing as evidenced by free flap reconstruction.    Monitor/Eval:  Monitor nutr support goals, labs, GI, med tx plan    Cherlyn Roberts, RD  [employer] [id number]

## 2014-07-03 NOTE — Progress Notes (Signed)
Pt POD #2    Awake alert, oriented with good sense of humor  States floaters are resolved and headaches are 4 on a scale of 10.  EOMI, PERRL  On his way to CT scan.    Will follow.

## 2014-07-03 NOTE — Plan of Care (Signed)
Neuro: Intact, stable.  Increased anxiety noted.  Afebrile.  Q 4 hour neuro/flap checks.  Increased swelling noted to left side of face. Dr. Haig Prophet notified, saw pt at bedside.  No new orders at this time.  Plastics will be notified by resident.     CV:  SR 70-80's, BP 140's/80's.  Slight generalized edema.  Pulses palpable.  JP drains to Rt. Lateral scalp (45 ml), also to Lt. Thigh (30 ml).      Resp:  O2 in use when pt sleeping.  Sats maintained >90%.  Pt using accapella frequently. CPAP when cleared by plastics due to minimal pressure around FLAP site.     GI:  BT + all 4 quads.  Pt reports passing flatus.  No BM yet. Tolerating diabetic diet.  No N/V.  Restarted metformin.  SSI PRN for BS> 150.  BS 122-164.      GU:  Foley Sidney'd.  Pt. Voided at 1730.      Pain:  Pt continues to c/o 5/10 pain to left head despite multiple modalities of pain meds utilized.

## 2014-07-03 NOTE — Progress Notes (Signed)
Neuro exam remains unchanged. Pain controlled by scheduled and PRN meds. Complaining of anxiety and the inability to sleep. Valium given per MD order with good effect. Patient slept for 3-4 hours. Complaints of white spot in left vision field intermittently. Dr. Jaynie Collins notified and at bedside to assess, does not believe it is an issue.  Q1 flap checks done. Flap remains soft, no edema. Serosanguinous drainage from flap site. VSS.

## 2014-07-03 NOTE — Progress Notes (Signed)
ID PROGRESS NOTE    Date Time: 07/03/2014 9:48 AM  Patient Name: [name] Brown      Problem List:    Acute  Worsening headaches  New changes in MRI suggesting abscess in R cribriform plate/ R frontal lobe and medial R orbit/ medial rectus muscle  Prior MSSA brain abscess in 09/2013, treated with drainage and IV abx  -S/P combine neurosurgery and plastics intervention 9/5 for repair of defect. No pus, but phlegmon noted and sent for pathology  Diarrhea chronic  -------------------------------------  Chronic conditions  Diabetes mellitus  Sleep apnea  Extensive history of recurrent sinusitis  -- multiple sinus surgeries, including at least two craniotomies    Assessment:   Stable clinically.  Cultures are negative.  Tolerating Abx.  Repeat CT today.  MRSA negative.  With no MRSA Hx, can likely stop Vancomycin tomorrow.  Surgical pathology is pending    Antibiotics:   #11 Vancomycin 1.75 gm IV q 8   #11 Cefepime 2 gm IV q 8H  #11 Metronidazole 500mg  IV q 8H    Plan:   COntinue current regimen  Can likely D/C Vancomycin tomorrow  COntinue Cefepime anf Flagyl for a 4 week course  CT pending  Discussed with pt, wife and nurse  ________________________________________________________________________  I have considered the potential drug interactions between antimicrobial agents   I have recommended and other medications required by the patient and adjusted appropriately for renal function   I have given thought to the complex medical conditions present and have endeavored to balance the interventions required by the acute conditions with the potential toxicities of the medications and procedures on the patient's well being and on the status of the other chronic conditions  I have engaged considerations and discussions about short and long term prognosis   I have discussed the case with appropriate family members and team members  Patient seen in the Intensive Care Unit  Critical care time spent analyzing case and  coordinating care: 30 minutes    Lines:   PICC Single Lumen 06/26/14 Right Basilic  Arterial Line 07/01/14 Left Radial    *I have performed a risk-benefit analysis and the patient needs a central line for access and IV medications    Family History:     Family History   Problem Relation Age of Onset   . Stroke Neg Hx        Social History:     History     Social History   . Marital Status: Married                      Social History Main Topics   . Smoking status: Light Tobacco Smoker     Types: Cigars   . Smokeless tobacco: Never Used      Comment: Occasional Cigars   . Alcohol Use: 0.0 oz/week     4-5 Cans of beer per week   . Drug Use: No          Allergies:     Allergies   Allergen Reactions   . Morphine    . Penicillins        Review of Systems:   General ROS: negative for - chills, fevers, night sweats, weight loss   HEENT: negative for - blurry vision, sore throat, thrush   Respiratory ROS: negative for cough, SOB  Cardiovascular ROS: negative for - chest pain, palpitations   Gastrointestinal ROS: negative for - abdominal pain, nausea, vomiting, diarrhea  Genito-Urinary  ROS: negative for - dysuria, urinary frequency/urgency   Musculoskeletal ROS: negative for - joint pain, joint stiffness or muscle pain   Dermatological ROS: negative for - rash and skin lesion changes   Neurological ROS: negative for - confusion,  Dizziness, +headache,  Hematological ROS: negative for - bruising, bleeding   Psychological ROS: negative for - changes in mood    Physical Exam:     Filed Vitals:    07/03/14 0600   BP: 119/71   Pulse: 75   Temp: 98.8   Resp: 17   SpO2: 99%       General Appearance: alert and appropriate, non-toxic  Neuro: alert, oriented, normal speech, normal attention and cognition  HEENT: no scleral icterus, pupils round and reactive, OP clear, clean surgical incisions. Drains in place  Neck: supple  Lungs: clear to auscultation, no wheezes, rales or rhonchi, symmetric air entry   Cardiac: normal rate, regular  rhythm, normal S1, S2, No m/r/g  Abdomen: soft, non-tender, non-distended, normal active bowel sounds, No masses or organomegaly  Extremities: no pedal edema, NO c/c. Dressing over L thigh  Skin: no rash  Psych: normal mood and affect    Labs:     Lab Results   Component Value Date    WBC 12.45* 07/01/2014    HGB 12.4* 07/01/2014    HCT 36.7* 07/01/2014    MCV 88.9 07/01/2014    PLT 224 07/01/2014     Lab Results   Component Value Date    CREAT 0.9 07/01/2014     Lab Results   Component Value Date    ALT 99* 07/01/2014    AST 73* 07/01/2014    ALKPHOS 51 07/01/2014    BILITOTAL 0.5 07/01/2014     No results found for: LACTATE    Microbiology:     Microbiology Results     Procedure Component Value Units Date/Time    AFB culture and smear [[id number]] Collected:  07/01/14 1303    Specimen Information:  Sputum / Tissue Updated:  07/02/14 0854    Narrative:      ORDER#: [id number]                                    ORDERED BY: LIM, JAE  SOURCE: Tissue skull (right ethimoid bone)           COLLECTED:  07/01/14 13:03  ANTIBIOTICS AT COLL.:                                RECEIVED :  07/01/14 19:27  Stain, Acid Fast                           FINAL       07/02/14 08:54  07/02/14   No Acid Fast Bacillus Seen  Culture Acid Fast Bacillus (AFB)           PENDING      AFB culture and smear [[id number]] Collected:  07/01/14 1303    Specimen Information:  Sputum / Tissue Updated:  07/02/14 0854    Narrative:      ORDER#: [id number]                                    ORDERED  BY: LIM, JAE  SOURCE: Tissue right Ethimoid                        COLLECTED:  07/01/14 13:03  ANTIBIOTICS AT COLL.:                                RECEIVED :  07/01/14 19:23  Stain, Acid Fast                           FINAL       07/02/14 08:54  07/02/14   No Acid Fast Bacillus Seen  Culture Acid Fast Bacillus (AFB)           PENDING      Anaerobic culture [[id number]] Collected:  07/01/14 1303    Specimen Information:  Other / Bone Updated:  07/03/14 0832     Narrative:      ORDER#: [id number]                                    ORDERED BY: LIM, JAE  SOURCE: Bone Right ethimoid bone                     COLLECTED:  07/01/14 13:03  ANTIBIOTICS AT COLL.:                                RECEIVED :  07/01/14 19:27  Culture, Anaerobic Bacteria                PRELIM      07/03/14 08:32  07/03/14   No growth to date, final report to follow      Anaerobic culture [[id number]] Collected:  07/01/14 1303    Specimen Information:  Other / Tissue Updated:  07/03/14 0832    Narrative:      ORDER#: [id number]                                    ORDERED BY: LIM, JAE  SOURCE: Tissue right ethomoid                        COLLECTED:  07/01/14 13:03  ANTIBIOTICS AT COLL.:                                RECEIVED :  07/01/14 19:23  Culture, Anaerobic Bacteria                PRELIM      07/03/14 08:32  07/03/14   No growth to date, final report to follow      CULTURE BLOOD AEROBIC AND ANAEROBIC [[id number]] Collected:  06/23/14 2124    Specimen Information:  Blood, Venipuncture Updated:  06/29/14 0121    Narrative:      ORDER#: [id number]                                    ORDERED BY: Candy Sledge  SOURCE: Blood, Venipuncture rue  COLLECTED:  06/23/14 21:24  ANTIBIOTICS AT COLL.:                                RECEIVED :  06/23/14 23:00  Culture Blood Aerobic and Anaerobic        FINAL       06/29/14 01:21  06/29/14   No growth after 5 days of incubation.      CULTURE BLOOD AEROBIC AND ANAEROBIC [[id number]] Collected:  06/23/14 2055    Specimen Information:  Blood, Venipuncture Updated:  06/29/14 0022    Narrative:      ORDER#: [id number]                                    ORDERED BY: Candy Sledge  SOURCE: Blood, Venipuncture lue                      COLLECTED:  06/23/14 20:55  ANTIBIOTICS AT COLL.:                                RECEIVED :  06/23/14 22:09  Culture Blood Aerobic and Anaerobic        FINAL       06/29/14 00:21  06/29/14   No growth after 5 days of incubation.       Fungus culture [[id number]] Collected:  07/01/14 1303    Specimen Information:  Other / Bone Updated:  07/02/14 0845    Narrative:      ORDER#: [id number]                                    ORDERED BY: LIM, JAE  SOURCE: Bone right ethomoid bone                     COLLECTED:  07/01/14 13:03  ANTIBIOTICS AT COLL.:                                RECEIVED :  07/01/14 19:27  Stain, Fungal                              FINAL       07/02/14 08:45  07/02/14   No Fungal or Yeast Elements Seen  Culture Fungus                             PENDING      Fungus culture [[id number]] Collected:  07/01/14 1303    Specimen Information:  Other / Tissue Updated:  07/02/14 0845    Narrative:      ORDER#: [id number]                                    ORDERED BY: LIM, JAE  SOURCE: Tissue right ethimoid                        COLLECTED:  07/01/14 13:03  ANTIBIOTICS AT COLL.:  RECEIVED :  07/01/14 19:23  Stain, Fungal                              FINAL       07/02/14 08:45  07/02/14   No Fungal or Yeast Elements Seen  Culture Fungus                             PENDING      MRSA culture [[id number]] Collected:  07/01/14 2212    Specimen Information:  Body Fluid / Nasal/Throat ASC Admission Updated:  07/02/14 2340    Narrative:      ORDER#: [id number]                                    ORDERED BY: Daphane Shepherd, MEREDITH  SOURCE: Nares and Throat                             COLLECTED:  07/01/14 22:12  ANTIBIOTICS AT COLL.:                                RECEIVED :  07/02/14 02:32  Culture MRSA Surveillance                  FINAL       07/02/14 23:40  07/02/14   Negative for Methicillin Resistant Staph aureus from Nares and             Negative for Methicillin Resistant Staph aureus from Throat      Wound culture & gram stain [[id number]] Collected:  07/01/14 1303    Specimen Information:  Wound / Abscess Updated:  07/02/14 1437    Narrative:      ORDER#: [id number]                                    ORDERED BY: LIM, JAE  SOURCE:  Abscess right ethimoid                       COLLECTED:  07/01/14 13:03  ANTIBIOTICS AT COLL.:                                RECEIVED :  07/01/14 19:23  Stain, Gram                                FINAL       07/01/14 22:39  07/01/14   No WBCs or organisms seen  Culture and Gram Stain, Aerobic, Wound     PRELIM      07/02/14 14:37  07/02/14   Culture no growth to date, Final report to follow      Wound culture & gram stain [[id number]] Collected:  07/01/14 1303    Specimen Information:  Wound / Abscess Updated:  07/02/14 1436    Narrative:      ORDER#: [id number]  ORDERED BY: LIM, JAE  SOURCE: Abscess right ethomoid bone                  COLLECTED:  07/01/14 13:03  ANTIBIOTICS AT COLL.:                                RECEIVED :  07/01/14 19:27  Stain, Gram                                FINAL       07/01/14 20:35  07/01/14   No WBCs or organisms seen  Culture and Gram Stain, Aerobic, Wound     PRELIM      07/02/14 14:36  07/02/14   Culture no growth to date, Final report to follow            Rads:   No results found.    Signed by: Micheline Rough, MD

## 2014-07-04 ENCOUNTER — Encounter: Payer: Self-pay | Admitting: Neurological Surgery

## 2014-07-04 LAB — CBC AND DIFFERENTIAL
Basophils Absolute Automated: 0.02 10*3/uL (ref 0.00–0.20)
Basophils Automated: 0 %
Eosinophils Absolute Automated: 0.22 10*3/uL (ref 0.00–0.70)
Eosinophils Automated: 2 %
Hematocrit: 35.1 % — ABNORMAL LOW (ref 42.0–52.0)
Hgb: 11.3 g/dL — ABNORMAL LOW (ref 13.0–17.0)
Immature Granulocytes Absolute: 0.02 10*3/uL
Immature Granulocytes: 0 %
Lymphocytes Absolute Automated: 2.72 10*3/uL (ref 0.50–4.40)
Lymphocytes Automated: 30 %
MCH: 29.3 pg (ref 28.0–32.0)
MCHC: 32.2 g/dL (ref 32.0–36.0)
MCV: 90.9 fL (ref 80.0–100.0)
MPV: 9.8 fL (ref 9.4–12.3)
Monocytes Absolute Automated: 0.7 10*3/uL (ref 0.00–1.20)
Monocytes: 8 %
Neutrophils Absolute: 5.46 10*3/uL (ref 1.80–8.10)
Neutrophils: 60 %
Nucleated RBC: 0 /100 WBC (ref 0–1)
Platelets: 230 10*3/uL (ref 140–400)
RBC: 3.86 10*6/uL — ABNORMAL LOW (ref 4.70–6.00)
RDW: 14 % (ref 12–15)
WBC: 9.14 10*3/uL (ref 3.50–10.80)

## 2014-07-04 LAB — GLUCOSE WHOLE BLOOD - POCT
Whole Blood Glucose POCT: 129 mg/dL — ABNORMAL HIGH (ref 70–100)
Whole Blood Glucose POCT: 138 mg/dL — ABNORMAL HIGH (ref 70–100)
Whole Blood Glucose POCT: 147 mg/dL — ABNORMAL HIGH (ref 70–100)
Whole Blood Glucose POCT: 155 mg/dL — ABNORMAL HIGH (ref 70–100)
Whole Blood Glucose POCT: 93 mg/dL (ref 70–100)

## 2014-07-04 LAB — BASIC METABOLIC PANEL
BUN: 10 mg/dL (ref 9.0–28.0)
CO2: 25 mEq/L (ref 22–29)
Calcium: 7.9 mg/dL — ABNORMAL LOW (ref 8.5–10.5)
Chloride: 108 mEq/L (ref 100–111)
Creatinine: 0.7 mg/dL (ref 0.7–1.3)
Glucose: 141 mg/dL — ABNORMAL HIGH (ref 70–100)
Potassium: 4 mEq/L (ref 3.5–5.1)
Sodium: 139 mEq/L (ref 136–145)

## 2014-07-04 LAB — PHOSPHORUS: Phosphorus: 2.9 mg/dL (ref 2.3–4.7)

## 2014-07-04 LAB — GFR: EGFR: >60

## 2014-07-04 LAB — MAGNESIUM: Magnesium: 1.9 mg/dL (ref 1.6–2.6)

## 2014-07-04 NOTE — Progress Notes (Signed)
VM left for wife to complete assessment- awaiting return call     Tamera Punt, MSW  Social Worker [facility] and TICU   [phone]   [email or web address]

## 2014-07-04 NOTE — Progress Notes (Signed)
NEUROSURGERY PROGRESS NOTE    Date Time: 07/04/2014 3:58 PM  Patient Name: [facility]  Consulting Attending Physician: Dr. Vivia Budge  Covered By: Neurosurgery PA Deland Pretty [phone]    Interim History:   POD 3  Pt c/o dizziness, headache, and left leg ache. Headache rated a 4-5/10.     Medications:     Current Facility-Administered Medications   Medication Dose Route Frequency   . aspirin  325 mg Oral Daily   . cefepime  2 g Intravenous Q8H SCH   . enoxaparin  40 mg Subcutaneous Q24H   . gabapentin  300 mg Oral 6X Daily   . insulin glargine  38 Units Subcutaneous QHS   . metFORMIN  1,000 mg Oral BID Meals   . metroNIDAZOLE  500 mg Intravenous Q8H SCH   . oxyCODONE  10 mg Oral Q12H SCH   . simvastatin  40 mg Oral QHS         Physical Exam:     Filed Vitals:    07/04/14 1400   BP: 140/74   Pulse: 102   Temp:    Resp: 41   SpO2: 96%       Intake and Output Summary (Last 24 hours) at Date Time    Intake/Output Summary (Last 24 hours) at 07/04/14 1558  Last data filed at 07/04/14 1400   Gross per 24 hour   Intake   2370 ml   Output   2595 ml   Net   -225 ml       Neuro exam:  Awake, alert  + facial swelling   Oriented X 3  PERRL  Speech is fluent, asking questions.  Follows commands briskly  Motor strength is grossly full except L leg limited due to harvest site pain    Incision: per plastics.    Labs:     Lab Results   Component Value Date    WBC 9.14 07/04/2014    HGB 11.3* 07/04/2014    HCT 35.1* 07/04/2014    MCV 90.9 07/04/2014    PLT 230 07/04/2014     Lab Results   Component Value Date    NA 139 07/04/2014    K 4.0 07/04/2014    CL 108 07/04/2014    CO2 25 07/04/2014     Lab Results   Component Value Date    INR 1.1 06/30/2014    INR 1.0 09/30/2013    PT 14.2 06/30/2014    PT 12.9 09/30/2013       Rads:     Radiology Results (24 Hour)     ** No results found for the last 24 hours. **          Assessment:   48 yo male s/p bifrontal craniotomy for evacuation of intracerebral abscess now POD #3.    9/6 HCT:  postsurgical changes    Plan:   - abx per ID  - wound per plastics     Signed by: Rolinda Roan PA-C  Date/Time: 07/04/2014 3:58 PM

## 2014-07-04 NOTE — Plan of Care (Signed)
Patient resting in bed and OOB to chair with standbb/steadying assist, c/o right sided head pain managed fairly effectively with scheduled and PRN pain medications.     VS/assessment/I&O's per doc flow sheets.     FLAP checks and neurovascular checks q2 hrs, patient continues to have strong arterial and venous signals, he also  has moderate facial swelling and SSI team aware. (refer to doc flow sheets for details).    IV antibiotics continue as ordered. Cultures still pending.    Awaiting transfer to [facility] bed when available.     Emotional support and education provided on plan of care to patient. Safety rounds completed every hour or more frequently for pain, position, bathroom, central and PIV assessment and environmental needs. Will continue to monitor for signs and symptoms of distress and encourage questions re: plan of care.

## 2014-07-04 NOTE — Progress Notes (Signed)
SW spoke with pts wife. Wife states that pt was independent, active working at home. This was found on a routine scan. Wife is Lawson Fiscal [phone]. She states that she is working and plans to take off to be with him at home when needed. Pt may be able to d/c to home with home care- had been to [facility] in the past and did not have good experience there.     Case Management Initial Discharge Planning Assessment     Psychosocial/Demographic Information   Name of interviewee/s:  wife    Orientation and decision making abilities of patient (ie a&ox3 able to make decisions, demented patient, patient on vent, etc)       Does the patient have an Advance Directive? Location? (home/on chart, if home-advised to bring in copy?) Not Received  Advance Directive: Patient does not have advance directive]   Healthcare Decision Maker (HDM) (if other than the patient) Include relationship and contact information.      Any additional emergency contacts? Extended Emergency Contact Information  Primary Emergency Contact: Copeman,Samantha   [place]  Home Phone: [phone]  Relation: Daughter  Secondary Emergency Contact: Lemming,Lori   [place]  Mobile Phone: [phone]  Relation: Spouse   Pt lives with:  Living Arrangements: Spouse/significant other]   Type of [place] [facility] lives[facility]   [facility]   Prior level of functioning (ambulation & ADL's)  Independent    Support system-list  (i.e.church, friends, extended family, friends?) Wife family    Do you want to designate an individual who will care for or assist you upon discharge?    If yes: Please list the name, relationship, phone number, and address of the designated individual. Name:  Relationship:  Phone Number:  Address:       Correct Insurance listed on face sheet - verified with the patient/HDM  yes      Discharge Planning Services in Place  Name of Primary Care Physician verified in patient banner (update in patient banner if not listed)  Deretha Emory, Windy Kalata, MD  [phone]   What DME does the patient currently own? (rolling walker, hospital bed, home O2, BiPAP/CPAP, bedside commode, cane, hoyer lift)  ]   ]none   ]   Are PT/OT services indicated? If so, has it been ordered?  yes   Has the patient been to an Acute Rehab or SNF in the past?  If so, where?   [facility]   Does the patient currently have home health or hospice/palliative services in place?  If so, list agency name.   no   Does the patient already have community dialysis set up?  If so, where? no      Anticipated Discharge Plan  Anticipated Disposition: Option A  home health    Anticipated Disposition: Option B Rehab    Who will transport the patient upon discharge? Wife    If applicable, were SNF or Hospice choices provided?     Palliative Care Consult needed? (if yes, contact attending MD)       Tamera Punt, MSW  Social Worker CVIC and TICU   [phone]   [email or web address]

## 2014-07-04 NOTE — Progress Notes (Signed)
[facility] HOSPITALIST PROGRESS NOTE    Date Time: 07/04/2014 5:04 PM  Patient Name: [facility]  Attending Physician: Tery Sanfilippo, *    Assessment:   1. Brain abscess with anterior fossa defect  2. 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 Extensive history of recurrent sinusitis  4. DM  5. OSA  6. HTN  7. HLD  8. Chronic headache  9. Hypophosphatemia     Plan:    Brain abscess with anterior fossa defect - S/P craniotomy and evacuation of abscess,  endoscopic sinus sphenoidotomy with navigation and reconstruction with Neuro/plastic/ENT team on 07/01/14. Will Continue with vancomycin, flagyl and cefepime (# 12). Continue Cefepime anf Flagyl for a 4 week course. Vanco stopped 06/04/14. Continue pain management. Wound care as per surgery team. Had PICC already. Blood cultures are NGTD. Wound culture NGTD. AFB and fungal cultures pending. 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 Extensive bony dehiscence right cribriform plate. Small area of bony dehiscence superomedial aspect of right orbit. F/U surgery and ENT teams. Repeat CT head on 9/7 show only postsurgical changes, no hydrocephalus.F/U with Plastic surgery, ID, ENT, and NS teams. Activity as tolerated to OOB   DM - optimally controlled. On lantus- 38 units and Metformin 1000 gram PO BID (used to get 86 units Sq daily along with Metformin 1000 mg PO BID just before surgery) and Novolog SS. Recent HbA1c- 6.3.    OSA - On home CPAP   Continue statin and ASA   Hypophosphatemia - K-phos 15 mm mol IV given and now stable level, replace it PRN    Case discussed with: pt and NS team/RN    Safety Checklist:     DVT prophylaxis:  CHEST guideline (See page [id number]) Mechanical and chemical    Foley:  Sharon Rn Foley protocol Not present   IVs:  Peripheral IV   PT/OT: Not needed   Daily CBC & or Chem ordered:  SHM/ABIM guidelines (see #5) No       Lines:     Active PICC Line / CVC Line / PIV Line / Drain / Airway /  Intraosseous Line / Epidural Line / ART Line / Line / Wound / Pressure Ulcer / NG/OG Tube     Name:   Placement date:   Placement time:   Site:   Days:    Peripheral IV 06/23/14 Left Forearm  06/23/14      Forearm   1           Disposition:     Today's date: 07/04/2014  Length of Stay: 11  Anticipated medical stability for discharge: 1-2 days   Reason for ongoing hospitalization: brain abscess   Anticipated discharge needs: TBD    Subjective     CC: Brain abscess    Interval History/24 hour events/subjective: s/p surgery- POD 3, pain improving, getting pain medication PRN. No fever, chills.,vomiting, focal weakness, seziures, and SOB/chest pain.        HPI: [name] Brown is a 48 y.o. male w/ h/o DM type 2, chronic headache, OSA, HLD, sinusitis, HTN, brain abscess s/p craniotomy, iv Rocephin for MSSA who p/w recurrent brain abscess on recent MRI. Pt reports bilateral retroorbital headaches weekly at around 2-3am which wake him up. Pt reports intermittent photophobia but denies visual changes or N/V. Pt denies F/C/D/SOB/CP/abd pain or any other sx's. Pt was admitted to NSICU on 09/30/13, d/c'ed to rehab on 10/07/14. Pt was d/c'ed home on 10/18/13.  Review of Systems:     See above    Physical Exam:     VITAL SIGNS PHYSICAL EXAM   Temp:  [98 F (36.7 C)-98.6 F (37 C)] 98.3 F (36.8 C)  Heart Rate:  [70-102] 102  Resp Rate:  [10-41] 41  BP: (128-157)/(66-89) 140/74 mmHg  Blood Glucose:    Telemetry: NSR      Intake/Output Summary (Last 24 hours) at 07/04/14 1704  Last data filed at 07/04/14 1400   Gross per 24 hour   Intake   2370 ml   Output   2595 ml   Net   -225 ml    General: No apparent distress, awake, alert, comfortable appearing, vital signs noted. Incision over scalp- clean and dry with JP drain in right side.   Pupils- reactive, equal, no pallor, no icterus  Neck- supple, no mass, no bruits/JVP   Resp: Clear to ausculation, no wheezing, rhonci, or rales noted; normal respiratory effort   CV: Regular rate and  rhythm, no murmurs, gallops, or rubs; no peripheral edema   GI: Abdomen obese, soft, non-tender, no masses; no hepatosplenomegaly   Skin: Normal temperature, tone, turgor; no rash, lesions, ulcers   Musc: No digital cyanosis or ischemia   Neuro: AOx3, CN 2-12 grossly intact, power 5/5 allover, sensation intact, DTR +1         Meds:     Medications were reviewed:    Labs:     Labs (last 72 hours):      Recent Labs  Lab 07/04/14  0443 07/03/14  1341   WBC 9.14 12.16*   HEMOGLOBIN 11.3* 11.9*   HEMATOCRIT 35.1* 36.6*   PLATELETS 230 244         Recent Labs  Lab 06/30/14  0816   PT 14.2   PT INR 1.1      Recent Labs  Lab 07/04/14  0443 07/03/14  1341 07/01/14  1545   SODIUM 139 140 135*   POTASSIUM 4.0 4.0 4.4   CHLORIDE 108 107 106   CO2 25 25 18*   BUN 10.0 11.0 13.0   CREATININE 0.7 0.8 0.9   CALCIUM 7.9* 8.2* 7.8*   ALBUMIN  --   --  3.9   PROTEIN, TOTAL  --   --  6.5   BILIRUBIN, TOTAL  --   --  0.5   ALKALINE PHOSPHATASE  --   --  51   ALT  --   --  99*   AST (SGOT)  --   --  73*   GLUCOSE 141* 154* 204*                   Microbiology, reviewed and are significant for:  Microbiology Results     Procedure Component Value Units Date/Time    CULTURE BLOOD AEROBIC AND ANAEROBIC [[id number]] Collected:  06/23/14 2124    Specimen Information:  Blood, Venipuncture Updated:  06/23/14 2300    Narrative:      1 BLUE+1 PURPLE    CULTURE BLOOD AEROBIC AND ANAEROBIC [[id number]] Collected:  06/23/14 2055    Specimen Information:  Blood, Venipuncture Updated:  06/23/14 2209    Narrative:      1 BLUE+1 PURPLE          Imaging, reviewed and are significant for:  Radiology Results (24 Hour)     ** No results found for the last 24 hours. **            Signed  by: Tery Sanfilippo, MD

## 2014-07-04 NOTE — Progress Notes (Signed)
07/04/2014 10:08 AM  [name] Brown,[name] Brown      Updated problem List   Acute  Worsening headaches  New changes in MRI suggesting abscess in R cribriform plate/ R frontal lobe and medial R orbit/ medial rectus muscle  Prior MSSA brain abscess in 09/2013, treated with drainage and IV abx  -S/P combine neurosurgery and plastics intervention/ craniotomy 9/5 for repair of defect.   --  No pus, but phlegmon noted and sent for pathology (cx negative)  Diarrhea chronic  -------------------------------------  Chronic conditions  Diabetes mellitus  Sleep apnea  Extensive history of recurrent sinusitis  -- multiple sinus surgeries, including at least two craniotomies    Assessment:   Facial swelling post op but no evidence of wound infection. So far, intra-op cultures are negative, but he has been on IV antibiotics prior to surgery.  MRSA swab negative. No MRSA found intra-op.  Plastics is closely monitoring the flap and the wound.    Recommendations:   Will stop vancomycin  Continue cefepime and flagyl for a total of 4 weeks  Follow up on the pathology (still pending)    Antibiotics:   #12 Vancomycin 1.75 gm IV q 8   #12 Cefepime 2 gm IV q 8H  #12 Metronidazole 500mg  IV q 8H    IV lines:   PICC Single Lumen 06/26/14 Right Basilic    A risk-benefit assessment was entertained and the ongoing use of the catheter is warranted for IV access and the infusion of intravenous medications.    Family History:     Family History   Problem Relation Age of Onset   . Stroke Neg Hx        Social History:     History     Social History   . Marital Status: Married     Spouse Name: N/A     Number of Children: N/A   . Years of Education: N/A     Social History Main Topics   . Smoking status: Light Tobacco Smoker     Types: Cigars   . Smokeless tobacco: Never Used      Comment: Occasional Cigars   . Alcohol Use: 0.0 oz/week     4-5 Cans of beer per week   . Drug Use: No   . Sexual Activity: Not on file     Other Topics Concern   . Not on file     Social  History Narrative       Allergies:     Allergies   Allergen Reactions   . Morphine      Pt stated the medication "burned" when given through an IV   . Penicillins       "I have never taken it, but I am allergic to it":       Review of Systems:   General ROS: negative for - chills, fevers, night sweats, weight loss   HEENT: still with swelling on face  Respiratory ROS: negative for cough, SOB  Cardiovascular ROS: negative for - chest pain, palpitations   Gastrointestinal ROS: negative for - abdominal pain, nausea, vomiting, diarrhea, has decreased appetite  Genito-Urinary ROS: negative for - dysuria, urinary frequency/urgency   Musculoskeletal ROS: some pain in R leg at donor site   Dermatological ROS: negative for - rash and skin lesion changes   Neurological ROS: negative for - confusion, headache, dizziness  Hematological ROS: negative for - bruising, bleeding   Psychological ROS: negative for - changes in mood    Physical  Exam:   BP 157/89 mmHg  Pulse 81  Temp(Src) 98 F (36.7 C) (Oral)  Resp 28  Ht 1.778 m (5\' 10" )  Wt 133.358 kg (294 lb)  BMI 42.18 kg/m2  SpO2 87%  Awake alert, cooperative, alert, still with facial swelling. Wound closed on R side, left side packed open, flow sensors in place.  No cellulitis. Drain on R side. Breathing well, abdomen soft nontender, R thigh with drain and no inflammation, PICC line site ok. No rash  Select labs:   Intra-op cultures negative  AFB, fungal remain negative  MRSA swab negative    Recent Labs  Lab 07/04/14  0443   WBC 9.14   HEMOGLOBIN 11.3*   HEMATOCRIT 35.1*   PLATELETS 230       Recent Labs  Lab 07/04/14  0443  07/01/14  1545   SODIUM 139  < > 135*   POTASSIUM 4.0  < > 4.4   CHLORIDE 108  < > 106   CO2 25  < > 18*   BUN 10.0  < > 13.0   CREATININE 0.7  < > 0.9   CALCIUM 7.9*  < > 7.8*   ALBUMIN  --   --  3.9   PROTEIN, TOTAL  --   --  6.5   BILIRUBIN, TOTAL  --   --  0.5   ALKALINE PHOSPHATASE  --   --  51   ALT  --   --  99*   AST (SGOT)  --   --  73*    GLUCOSE 141*  < > 204*   < > = values in this interval not displayed.     Rads:       Attestations:     I certify that I personally reviewed the interim medical record, interviewed the patient, performed a physical examination, as documented above, reviewed the medications being actively administered, and reviewed the referenced laboratory and radiographic studies.  I have discussed my thoughts with the patient and with relevant medical team members.  I have given thought to the complex medical conditions present and have endeavored to balance the interventions required by the acute conditions with the potential toxicities of the medications and procedures on the patient's well being and on the status of the other chronic conditions. I have considered the potential drug interactions between anti-microbial agents we have recommended and other medications required by the patient.   I have documented my recommendations for the primary attending, the medical trainees and the other medical and/or surgical consultants involved in caring for the patient.  A total of 35  minutes were spent in the care of this patient.    Signed by: Guadalupe Maple, MD

## 2014-07-04 NOTE — Progress Notes (Signed)
Pt is doing well    Af vss    Drain out put above min of 30cc/d/drain for two days straight    Flap great a and venous signals    L thigh- incision intact      A/p pod #3  Change xeroform tomorrow  Remove drain when ready    Doppler check q4hr    Can tsfr to [facility] from our standpoint.    [name], MD [facility]  Plastic Surgery, [phone]

## 2014-07-04 NOTE — Progress Notes (Addendum)
Attending Attestation:     I have personally seen and examined mr Shelburne and I have reviewed the notes, assessments, and/ or procedures by dr Daphane Shepherd, I concur with his/her documentation of Jarick Harkins.    48 year old gentleman, status post vastus lateralis flap for brain abscess debridement, patient doing well, drains in place, is on the floor status, otherwise patient HD stable, tolerating diet, passing gas,pain well-controlled, neurologically grossly intact, no other current issues, and Antibiotics per infectious    Exclusive of time for Resident/ Student education and for separate procedures-  Total Critical Care Time spent NO minutes.    Abagail Kitchens, MD, FACS    Abagail Kitchens, M.D., F.A.C.S. Philis Kendall [phone])  Attending Surgeon, [facility]  General and Acute Care Surgery, Trauma and Surgical Critical Care  Board Certified in Surgery and in Surgical Critical Care  and Subspecialty Certified in Neurocritical Care      ICU ACUTE CARE SURGERY / TRAUMA PROGRESS NOTE     Date/Time: 07/04/2014 7:24 AM  Patient Name: [facility]  Primary Care Physician: Tressa Busman, MD  Hospital Day: 11  Procedure(s):  CRANIOTOMY, REMOVAL TUMOR  ENDOSCOPIC SINUS sphenoidotomy with navigation  RECONSTRUCTION, FREE FLAP MAJOR   Post-op Day: 3 Days Post-Op    Assessment/Plan:   This patient is critically ill and requiring ICU level of care due to Risk of life or limb.    The patient has the following active problems:  Patient Active Problem List   Diagnosis   . Brain abscess   . Type 2 diabetes mellitus   . Chronic headache   . OSA (obstructive sleep apnea)   . HLD (hyperlipidemia)   . Sinusitis   . HTN (hypertension)   . Hypophosphatemia       Plan by systems:  Neuro: Pain control with PO meds; drain management per NSGY; repeat head CT yesterday - "post-surgical chances". No current plans for return to OR.  Seizure Prophylaxis not indicated  Pulm: No acute issues, extubated post-op without complication, didn't  require CPAP overnight2L O2, wean O2 today  CV: No acute issues, hemodynamically stable  Endo: Glucose <200, continue current insulin regimen (lantus and SSI)  GI: Tolerating diabetic diet,   GI Prophylaxis:  No prophylaxis need, patient is on a diet   Heme/ID: On abx regimen per ID recs, including vancomycin, flagy, and cefepime., Will continue to follow recs. WBC normalized - 9 (12), H/H stable. WCx/BCx - NGTD  DVT Prophylaxis: enoxaparin 40 Daily   Renal: No issues, good UOP.  Foley: no  Neuromuscular:  Weight Bearing Right Left   Upper Extremity WBAT WBAT   Lower Extremity WBAT WBAT   PT/OT: no  Psych: Valium at night for anxiety  Wounds: cranial wounds per SGY and plastics  Lines: PICC, 2 drains (Head 55 ss[80], Leg 60 serous [0]), ON-Q pump  Dispo: Needs transfer orders    Neurosurgery - Lim, Plastics - [place] and ID - [place]    Interval History:   Kyri Dai is a 47 y.o. male who presents to the hospital after Other: Cerebral/cribiriform abscess.     Significant overnight events include None. Head CT yesterday - postoperative changes   Allergies:     Allergies   Allergen Reactions   . Morphine      Pt stated the medication "burned" when given through an IV   . Penicillins       "I have never taken it, but I am allergic to it":  Medications:     Current Facility-Administered Medications   Medication Dose Route Frequency Provider Last Rate Last Dose   . aspirin tablet 325 mg  325 mg Oral Daily Nichols, Ty B, MD   325 mg at 07/03/14 1117   . bupivacaine (MARCAINE) 0.25 % 550 mL in ON Q PUMP  2 mL/hr Percutaneous Continuous Higinio Roger, MD 2 mL/hr at 07/01/14 1739 2 mL/hr at 07/01/14 1739   . cefepime (MAXIPIME) 2 g in sodium chloride 0.9 % 100 mL IVPB mini-bag plus  2 g Intravenous [facility] Guadalupe Maple, MD 200 mL/hr at 07/04/14 1610 2 g at 07/04/14 0608   . dextrose 50 % bolus 25 mL  25 mL Intravenous PRN Atnafu, Belay W, MD       . diazepam (VALIUM) tablet 5 mg  5 mg Oral QHS PRN Rhea Belton B, MD   5 mg at 07/04/14 0014   . diphenhydrAMINE (BENADRYL) capsule 25 mg  25 mg Oral Q6H PRN Balinda Quails Man, MD   25 mg at 07/04/14 9604   . enoxaparin (LOVENOX) syringe 40 mg  40 mg Subcutaneous Q24H Atnafu, Vinie Sill, MD   40 mg at 07/03/14 1738   . gabapentin (NEURONTIN) capsule 300 mg  300 mg Oral 6X Daily Gardiner Sleeper, MD   300 mg at 07/04/14 5409   . glucagon (rDNA) (GLUCAGEN) injection 1 mg  1 mg Intramuscular PRN Atnafu, Belay W, MD       . hydrALAZINE (APRESOLINE) injection 10 mg  10 mg Intravenous Q6H PRN Gardiner Sleeper, MD   10 mg at 07/01/14 2022   . ibuprofen (ADVIL,MOTRIN) tablet 400 mg  400 mg Oral Q6H PRN Rayna Sexton, MD   400 mg at 07/04/14 8119   . insulin aspart (NovoLOG) injection 1-5 Units  1-5 Units Subcutaneous TID AC PRN Rafiq, Nila, MD   1 Units at 07/03/14 1750   . insulin glargine (LANTUS) injection 38 Units  38 Units Subcutaneous QHS Wyvonna Plum, MD   38 Units at 07/03/14 2248   . metFORMIN (GLUCOPHAGE) tablet 1,000 mg  1,000 mg Oral BID Meals Wyvonna Plum, MD   1,000 mg at 07/03/14 1706   . metroNIDAZOLE (FLAGYL) 500mg  in NS IVPB (premix)  500 mg Intravenous [facility] Guadalupe Maple, MD 100 mL/hr at 07/04/14 0229 500 mg at 07/04/14 0229   . oxyCODONE (OxyCONTIN) 12 hr tablet 10 mg  10 mg Oral Q12H [facility] Wyvonna Plum, MD   10 mg at 07/03/14 2248   . oxyCODONE (ROXICODONE) immediate release tablet 15 mg  15 mg Oral Q3H PRN Gardiner Sleeper, MD   15 mg at 07/04/14 0439   . simvastatin (ZOCOR) tablet 40 mg  40 mg Oral QHS Gardiner Sleeper, MD   40 mg at 07/03/14 2248   . vancomycin (VANCOCIN) 1,750 mg in sodium chloride 0.9 % 500 mL IVPB  1,750 mg Intravenous [facility] Tanda Rockers, Ty B, MD 250 mL/hr at 07/04/14 0645 1,750 mg at 07/04/14 0645       Labs:     Results     Procedure Component Value Units Date/Time    Phosphorus [[id number]] Collected:  07/04/14 0443    Specimen Information:  Blood Updated:  07/04/14 0549     Phosphorus 2.9 mg/dL     GFR [[id number]]  Collected:  07/04/14 0443     EGFR >60.0 Updated:  07/04/14 0549    Basic Metabolic Panel [[id number]]  (  Abnormal) Collected:  07/04/14 0443    Specimen Information:  Blood Updated:  07/04/14 0549     Glucose 141 (H) mg/dL      BUN 96.7 mg/dL      Creatinine 0.7 mg/dL      CALCIUM 7.9 (L) mg/dL      Sodium 893 mEq/L      Potassium 4.0 mEq/L      Chloride 108 mEq/L      CO2 25 mEq/L     Magnesium [[id number]] Collected:  07/04/14 0443    Specimen Information:  Blood Updated:  07/04/14 0549     Magnesium 1.9 mg/dL     CBC and differential [[id number]]  (Abnormal) Collected:  07/04/14 0443    Specimen Information:  Blood / Blood Updated:  07/04/14 0524     WBC 9.14 x10 3/uL      RBC 3.86 (L) x10 6/uL      Hgb 11.3 (L) g/dL      Hematocrit 23.5 (L) %      MCV 90.9 fL      MCH 29.3 pg      MCHC 32.2 g/dL      RDW 14 %      Platelets 230 x10 3/uL      MPV 9.8 fL      Neutrophils 60 %      Lymphocytes Automated 30 %      Monocytes 8 %      Eosinophils Automated 2 %      Basophils Automated 0 %      Immature Granulocyte 0 %      Nucleated RBC 0 /100 WBC      Neutrophils Absolute 5.46 x10 3/uL      Abs Lymph Automated 2.72 x10 3/uL      Abs Mono Automated 0.70 x10 3/uL      Abs Eos Automated 0.22 x10 3/uL      Absolute Baso Automated 0.02 x10 3/uL      Absolute Immature Granulocyte 0.02 x10 3/uL     RBC OR HOLD Blood Product [[id number]] Collected:  06/30/14 0817     RBC Leukoreduced [id number]    released Updated:  07/04/14 0044     RBC Leukoreduced [id number]    released     Glucose Whole Blood - POCT [[id number]]  (Abnormal) Collected:  07/03/14 2040     POCT - Glucose Whole blood 141 (H) mg/dL Updated:  73/41/93 7902    Glucose Whole Blood - POCT [[id number]]  (Abnormal) Collected:  07/03/14 1742     POCT - Glucose Whole blood 164 (H) mg/dL Updated:  92/42/68 3419    Wound culture & gram stain [[id number]] Collected:  07/01/14 1303    Specimen Information:  Wound / Abscess Updated:  07/03/14 1642    Narrative:       ORDER#: [id number]                                    ORDERED BY: LIM, JAE  SOURCE: Abscess right ethimoid                       COLLECTED:  07/01/14 13:03  ANTIBIOTICS AT COLL.:                                RECEIVED :  07/01/14 19:23  Stain, Gram                                FINAL       07/01/14 22:39  07/01/14   No WBCs or organisms seen  Culture and Gram Stain, Aerobic, Wound     PRELIM      07/03/14 16:42  07/02/14   Culture no growth to date, Final report to follow  07/03/14   Culture no growth to date, Final report to follow      Wound culture & gram stain [[id number]] Collected:  07/01/14 1303    Specimen Information:  Wound / Abscess Updated:  07/03/14 1642    Narrative:      ORDER#: [id number]                                    ORDERED BY: LIM, JAE  SOURCE: Abscess right ethomoid bone                  COLLECTED:  07/01/14 13:03  ANTIBIOTICS AT COLL.:                                RECEIVED :  07/01/14 19:27  Stain, Gram                                FINAL       07/01/14 20:35  07/01/14   No WBCs or organisms seen  Culture and Gram Stain, Aerobic, Wound     PRELIM      07/03/14 16:42  07/02/14   Culture no growth to date, Final report to follow  07/03/14   Culture no growth to date, Final report to follow      CBC without differential [[id number]]  (Abnormal) Collected:  07/03/14 1341    Specimen Information:  Blood / Blood Updated:  07/03/14 1431     WBC 12.16 (H) x10 3/uL      RBC 3.97 (L) x10 6/uL      Hgb 11.9 (L) g/dL      Hematocrit 29.5 (L) %      MCV 92.2 fL      MCH 30.0 pg      MCHC 32.5 g/dL      RDW 14 %      Platelets 244 x10 3/uL      MPV 10.1 fL      Nucleated RBC 0 /100 WBC     Vancomycin, trough [[id number]] Collected:  07/03/14 1341    Specimen Information:  Blood Updated:  07/03/14 1425     Vancomycin Trough 19.8 ug/mL      Vancomycin Time of Last Dose [id number]      Vancomycin Date of Last Dose 07/03/2014     Narrative:      Please draw at 1330, 07/03/14    Basic Metabolic Panel  [[id number]]  (Abnormal) Collected:  07/03/14 1341    Specimen Information:  Blood Updated:  07/03/14 1417     Glucose 154 (H) mg/dL      BUN 63.8 mg/dL      Creatinine 0.8 mg/dL      CALCIUM 8.2 (L) mg/dL      Sodium 756 mEq/L  Potassium 4.0 mEq/L      Chloride 107 mEq/L      CO2 25 mEq/L     Magnesium [[id number]] Collected:  07/03/14 1341    Specimen Information:  Blood Updated:  07/03/14 1417     Magnesium 2.1 mg/dL     Phosphorus [[id number]]  (Abnormal) Collected:  07/03/14 1341    Specimen Information:  Blood Updated:  07/03/14 1417     Phosphorus 2.0 (L) mg/dL     GFR [[id number]] Collected:  07/03/14 1341     EGFR >60.0 Updated:  07/03/14 1417    Glucose Whole Blood - POCT [[id number]]  (Abnormal) Collected:  07/03/14 1257     POCT - Glucose Whole blood 122 (H) mg/dL Updated:  09/32/35 5732    Glucose Whole Blood - POCT [[id number]]  (Abnormal) Collected:  07/03/14 0819     POCT - Glucose Whole blood 132 (H) mg/dL Updated:  23/76/28 3151    Anaerobic culture [[id number]] Collected:  07/01/14 1303    Specimen Information:  Other / Bone Updated:  07/03/14 0832    Narrative:      ORDER#: [id number]                                    ORDERED BY: LIM, JAE  SOURCE: Bone Right ethimoid bone                     COLLECTED:  07/01/14 13:03  ANTIBIOTICS AT COLL.:                                RECEIVED :  07/01/14 19:27  Culture, Anaerobic Bacteria                PRELIM      07/03/14 08:32  07/03/14   No growth to date, final report to follow      Anaerobic culture [[id number]] Collected:  07/01/14 1303    Specimen Information:  Other / Tissue Updated:  07/03/14 0832    Narrative:      ORDER#: [id number]                                    ORDERED BY: LIM, JAE  SOURCE: Tissue right ethomoid                        COLLECTED:  07/01/14 13:03  ANTIBIOTICS AT COLL.:                                RECEIVED :  07/01/14 19:23  Culture, Anaerobic Bacteria                PRELIM      07/03/14 08:32  07/03/14   No growth to date, final  report to follow            Rads:   Radiological Procedure reviewed.    Radiology Results (24 Hour)     Procedure Component Value Units Date/Time    CT Head WO Contrast [[id number]] Collected:  07/03/14 1057    Order Status:  Completed Updated:  07/03/14 1105    Narrative:      Clinical history: Status post surgery.  Comments: Unenhanced CT scan of the brain was performed.    There is postsurgical change with evidence of prior frontal craniotomy  and left frontal craniectomy. Radiodense material over the left frontal  lobe anteriorly measuring up to 2.1 cm in thickness is likely a  combination of a flap and blood products. There is small amount of  postoperative pneumocephalus. There is encephalomalacic change and/or  edema in the right frontal lobe, similar to 06/20/2014 MRI. There is no  hydrocephalus. There is mild mass effect with mild posterior  displacement of the frontal horns of the lateral ventricles.      Impression:       Postsurgical changes as above.    Melody Haver, MD   07/03/2014 11:01 AM            Physical Exam:   Temp:  [98 F (36.7 C)-98.6 F (37 C)] 98.2 F (36.8 C)  Heart Rate:  [71-92] 77  Resp Rate:  [10-29] 19  BP: (123-147)/(61-85) 130/69 mmHg      Invasive ICU Hemodynamics:            Art Line (2)  Arterial Line BP 2: 136/82 mmHg (07/03/14 1000)  Arterial Line MAP (mmHg): 105 mmHg (07/03/14 1000)  ABP 2 Pulse Rate: 94 mmHg (07/03/14 1000)    Vital Signs:  Filed Vitals:    07/04/14 0600   BP: 130/69   Pulse: 77   Temp:    Resp: 19   SpO2: 95%       Vent Settings:             I/O:  Intake and Output Summary (Last 24 hours) at Date Time  I/O last 3 completed shifts:  In: 6835 [P.O.:1560; I.V.:825; IV Piggyback:4450]  Out: 3460 [Urine:3325; Drains:135]    Output:     Closed/Suction Drain Left Thigh Bulb 19 Fr.-Output (mL): 25 mL (07/04/14 0600)  Closed/Suction Drain Right Scalp Bulb 15 Fr.-Output (mL): 5 mL (07/04/14 0600)     [REMOVED] Urethral Catheter Double-lumen;Latex 16 Fr.-Output  (mL): 225 mL ( Park'd) (07/03/14 1000)                           Nutrition:   Orders Placed This Encounter   Procedures   . Diet consistent carbohydrate   . Juven Quantity:: A. One; Frequency:: BID with meals       Physical Exam:  Physical Exam   Constitutional: He is oriented to person, place, and time. He appears well-developed and well-nourished. No distress.   Some confusion about timing of events - forgot CT was done yesterday   HENT:   Large surgical wound encompassing superior aspect of head, closed with sutures, JP drain from right temporal area with small maount of serosang fluid.  Right temporal area open with xeroform dressing.  No surrounding erythema and no drainage form wound     Eyes: Conjunctivae are normal. Pupils are equal, round, and reactive to light.   Neck: Normal range of motion. Neck supple. No tracheal deviation present.   Cardiovascular: Normal rate, regular rhythm, normal heart sounds and intact distal pulses.    Pulmonary/Chest: Effort normal and breath sounds normal. No respiratory distress.   Abdominal: Soft. He exhibits no distension. There is no tenderness.   Musculoskeletal: Normal range of motion. He exhibits no tenderness.   Neurological: He is alert and oriented to person, place, and time.   Skin: Skin is warm and dry.   Surgical  wound as describe in HENT   Psychiatric: He has a normal mood and affect. His behavior is normal.

## 2014-07-04 NOTE — Progress Notes (Signed)
Surgery Daily Progress Note  Surgery Team Plastic Surgery    Date/Time: 07/04/2014 9:53 AM  Bed:  F304/F304.01  Hospital Day: 12  3 Days Post-Op  Primary Procedure:   1. Intracranial free tissue transfer reconstruction, using vastus  lateralis free-flap.  2. Complex closure of the scalp, 22 cm.  3. Reconstruction of scalp using Integra dermal matrix, measuring roughly  an area of 6 x 12 cm.  4. Nasal endoscopy by Dr. Dorinda Hill.   5. Placement of On-Q pump, left thigh    Interval History:   No acute issues overnight. Pain controlled. Still with facial swelling    Diet: Diet consistent carbohydrate  Juven Quantity:: A. One; Frequency:: BID with meals     Active Lines, Drains and Airways     Name:   Placement date:   Placement time:   Site:   Days:    PICC Single Lumen 06/26/14 Right Basilic  06/26/14   1517   Basilic   7    OnQ Pump (Incisional) 07/01/14 left thigh Left  07/01/14      left thigh   3    Closed/Suction Drain Left Thigh Bulb 19 Fr.  07/01/14   1520   Thigh   2    Closed/Suction Drain Right Scalp Bulb 15 Fr.  07/01/14   1646   Scalp   2          Medications:     Current Facility-Administered Medications   Medication Dose Route Frequency   . aspirin  325 mg Oral Daily   . cefepime  2 g Intravenous Q8H SCH   . enoxaparin  40 mg Subcutaneous Q24H   . gabapentin  300 mg Oral 6X Daily   . insulin glargine  38 Units Subcutaneous QHS   . metFORMIN  1,000 mg Oral BID Meals   . metroNIDAZOLE  500 mg Intravenous Q8H SCH   . oxyCODONE  10 mg Oral Q12H SCH   . simvastatin  40 mg Oral QHS   . vancomycin  1,750 mg Intravenous Q8H SCH     . bupivacaine 0.25% 2 mL/hr (07/01/14 1739)     dextrose, diazepam, diphenhydrAMINE, glucagon (rDNA), hydrALAZINE, ibuprofen, insulin aspart, oxyCODONE     Physical Exam:   Current Vitals:   Filed Vitals:    07/04/14 0800   BP: 157/89   Pulse: 81   Temp: 98 F (36.7 C)   Resp: 28   SpO2: 87%     Vital signs in last 24 hours:Temp:  [98 F (36.7 C)-98.6 F (37 C)] 98 F (36.7  C)  Heart Rate:  [70-92] 81  Resp Rate:  [10-29] 28  BP: (128-157)/(66-89) 157/89 mmHg  Body mass index is 42.18 kg/(m^2).    Intake and Output Summary (Last 24 hours):  I/O last 3 completed shifts:  In: 6835 [P.O.:1560; I.V.:825; IV Piggyback:4450]  Out: 3460 [Urine:3325; Drains:135]    PHYSICAL EXAM:  General:AAOx3, NAD  HEENT: scalp incision with staples in place, partially closed, minimal amt ss drainage, drain in place with minimal ss output, flaps with intact arterial/venous doppler signals, minimal infraorbital swelling  CV: RRR, no murmurs, rubs, gallops  Pulm: CTAB, no wheezes, rhonci  Extremities: LLE incision c/d/i, JP in place with minimal output, on-q in place  Neuro: Sensation intact throughout, CN 2-12 intact, gross motor intact      Labs:     Hematology   Recent Labs      07/04/14   0443  07/03/14  1341  07/01/14   1545   WBC  9.14  12.16*  12.45*   HEMOGLOBIN  11.3*  11.9*  12.4*   HEMATOCRIT  35.1*  36.6*  36.7*   PLATELETS  230  244  224      Chemistry   Recent Labs      07/04/14   0443  07/03/14   1341  07/01/14   1545   SODIUM  139  140  135*   POTASSIUM  4.0  4.0  4.4   CHLORIDE  108  107  106   CO2  25  25  18*   BUN  10.0  11.0  13.0   CREATININE  0.7  0.8  0.9   GLUCOSE  141*  154*  204*   CALCIUM  7.9*  8.2*  7.8*   MAGNESIUM  1.9  2.1   --    PHOSPHORUS  2.9  2.0*   --       Coagulation   No results for input(s): PT, INR, PTT in the last 72 hours.   Liver Function Tests   Recent Labs      07/01/14   1545   AST (SGOT)  73*   ALT  99*   ALKALINE PHOSPHATASE  51   PROTEIN, TOTAL  6.5   ALBUMIN  3.9   BILIRUBIN, TOTAL  0.5        Cultures:  None     Rads:   Radiological Procedure reviewed.  Ct Head Wo Contrast    07/03/2014    Postsurgical changes as above.  Melody Haver, MD  07/03/2014 11:01 AM         Assessment:   [name] Brown is a 48 y.o. male hx HTN, DM, brain abscess s/p craniotomy 09/2013 now w recurrent brain abscess POD 3 s/p procedure noted above    Plan:   Continue flap checks q2hr  with continuous flow monitors  Continue q2hr neurovascular checks to LLE; elevate  Avoid any compression to the forehead  OOB to chair as able  Continue asa 325 and lovenox 40qd  Continue abx per ID; currently vanc/cefepime/flagyl  Monitor drain output for possible d/c    M. Everlena Cooper, MD  General Surgery PGY-2  Pager: [phone]

## 2014-07-04 NOTE — Progress Notes (Signed)
No events.  Has been complaining of lack of sleep.  Pain controlled    Filed Vitals:    07/04/14 0600   BP: 130/69   Pulse: 77   Temp:    Resp: 19   SpO2: 95%       Intake/Output Summary (Last 24 hours) at 07/04/14 0831  Last data filed at 07/04/14 0645   Gross per 24 hour   Intake   3310 ml   Output   1890 ml   Net   1420 ml     JP scalp 55+  JP leg 60+    Appears fatigued  Some infraorbital swelling bilaterally  Incision c/d/i  LLE incision c/d/i  Drains serosanguinous  Doppler pulse strong, biphasic    CT:  Ct Head Wo Contrast    07/03/2014    Postsurgical changes as above.  Melody Haver, MD  07/03/2014 11:01 AM     A/P:POD#3 s/p free flap to skull base    -Cont Q2 flap checks  -OOB to chair  -Cont reg diet  -Scalp drain too much to d/c, may consider this PM or tomorrow  -Swelling likely resolving edema from above, not a concern at this time.    Quantisha Marsicano C. Cherly Hensen, MD  Pediatric and Craniofacial Plastic Surgery  [address], [address]  [place], [place] [address]    [phone] (office)

## 2014-07-05 LAB — GLUCOSE WHOLE BLOOD - POCT
Whole Blood Glucose POCT: 132 mg/dL — ABNORMAL HIGH (ref 70–100)
Whole Blood Glucose POCT: 166 mg/dL — ABNORMAL HIGH (ref 70–100)
Whole Blood Glucose POCT: 159 mg/dL — ABNORMAL HIGH (ref 70–100)
Whole Blood Glucose POCT: 186 mg/dL — ABNORMAL HIGH (ref 70–100)

## 2014-07-05 MED ORDER — HYDROMORPHONE HCL PF 1 MG/ML IJ SOLN
1.0000 mg | INTRAMUSCULAR | Status: AC | PRN
Start: 2014-07-05 — End: 2014-07-06
  Administered 2014-07-06 (×4): 1 mg via INTRAVENOUS
  Filled 2014-07-05 (×5): qty 1

## 2014-07-05 MED ORDER — HYDROMORPHONE HCL 2 MG PO TABS
2.0000 mg | ORAL_TABLET | ORAL | Status: DC | PRN
Start: 2014-07-05 — End: 2014-07-08
  Administered 2014-07-05 – 2014-07-08 (×7): 2 mg via ORAL
  Filled 2014-07-05 (×7): qty 1

## 2014-07-05 NOTE — Plan of Care (Addendum)
Problem: Pain  Goal: Patient's pain/discomfort is manageable  Intervention: Offer non-pharmocological pain management interventions  Received pt around 2200.   B/P 130s-150s. Team aware. ACHS. Patient communicated that the Lantus dose was "way too high" and that he doesn't want [place] to "drop him low". Requested for me to call MD to verify order. Blood glucose was also checked during this time, 147mg /dL. Dr. Clydene Fake paged. Confirmed order. Patient agreed to take insulin. Patient educated on importance of Lantus to control blood sugars during day, and was also informed of glucose levels during the time he has been on this dose (100s and up). At 2340, patient requested that his glucose be checked. Result was 155mg /dL. Communicated to patient. A&Ox3, however, at times very argumentative. q4h neuro checks--- PERRLA, +b/l grips of UEs and dorsiflexion/extension of LE's. At times, short term memory loss. This is at baseline. NSR with occasional PVCs. On RA currently. Sats at 96%. CHO/Juven diet. BM 07/04/2014. Standby assist. q4h flap checks with internal doppler (left temple). Venous doppler on continuous per orders, audible. Arterial intact and audible. Pink, warm left temple. With staples and packed with xeroform. Right temple with JP drain, SS drainage. Left thigh, harvest site, with Tegaderm, CDI, with JP--serous drainage. Right PICC single lumen, due to change 9/10. Scheduled oxycotin and gabapentin for pain. ONQ pump on left thigh. Will continue to monitor throughout rest of shift.        Patient is very noncompliant with breathing exercises and education overall. Continuing to encourage patient to do breathing exercises and continuing education on overall care. Will continue to monitor.

## 2014-07-05 NOTE — Plan of Care (Signed)
Pt alert and oriented x4. VS stable. NSR on monitor. Pt on RA. Pain controlled with pain meds. Tolerated diet well.    Internal doppler check every 4 hours. Neurocheck every 4 hours. Staples around the head. L scalp incision has xeroform in place, changed it daily per order. JP x2 on the head incision site and left thigh incision site, output recorded.     Pt needs minimal assist for ambulation and moving around. Voided adequate amount.  BM 2 days ago. PICC line on left arm. Pt on IV Abx.     Continue to monitor pt.

## 2014-07-05 NOTE — Progress Notes (Signed)
Neurosurgery Progress Note  POD 4  S:   No new neuro complaints    O:   Filed Vitals:    07/05/14 0807   BP: 135/80   Pulse: 89   Temp: 98.4 F (36.9 C)   Resp:    SpO2: 94%         PHYSICAL EXAM:  Awake, alert  + facial swelling   Oriented X 3  PERRL  Speech is fluent, asking questions.  Follows commands briskly  Incision is clear        A/P:   Neuro stable  Wound management per Plastics  Continue abx per ID      Rella Larve, MD

## 2014-07-05 NOTE — Progress Notes (Signed)
Transferred to [facility].  No events  Has been OOB to chair    Filed Vitals:    07/05/14 0419   BP: 141/77   Pulse: 87   Temp: 99.9 F (37.7 C)   Resp: 18   SpO2: 91%       Intake/Output Summary (Last 24 hours) at 07/05/14 0746  Last data filed at 07/05/14 0500   Gross per 24 hour   Intake    400 ml   Output   1655 ml   Net  -1255 ml     JP 35cc leg (12 hr)  JP 15cc scalp (12 hr)    Awake, alert  Swelling resolved  No ecchymosis  Staple line intact  Xeroform in place  Dopplers biphasic, strong  Leg c/d/i  ON Q pump intact  Leg drain and scalp drain serosanguinous    A/P:  Doing well POD#4.  -Cont doppler monitoring  -PT consult for mobility.  No stairs yet  -Keep drains for now, will Canon scalp possibly this PM or tomorrow, but would like daily output to be <30cc.  Thigh drainage may increase after PT activity  -On Q pump removed  -please call with questions.    Tomorrow Dehaas C. Cherly Hensen, MD  Pediatric and Craniofacial Plastic Surgery  [address]  [place], [place] [address]    [phone] (office)

## 2014-07-05 NOTE — Plan of Care (Signed)
Problem: Pain  Goal: Patient's pain/discomfort is manageable  Outcome: Progressing  Patient A&Ox3, VSS. Monitor shows normal sinus rhythm in the 80s-90s. Was on RA during the day but requested to be put on 4L O2 for sleeping. OOB with assist, patient ambulated in the room. Internal doppler and neuro checks Q4H, positive venous and arterial sounds. RUA PICC single lumen in place, receiving scheduled antibiotics IVPB. Head incision approximated with staples, OTA with scant serosanguinous drainage. Right head JP drain removed per plastics order. LLE JP drain in place draining serous fluid. On-Q pumped removed during day shift. Left scalp incision with xeroform in place, dressing change daily (completed during day shift). Patient's pain well controlled with scheduled PO oxycontin and PO gabapentin, as well as PO roxicodone PRN and PO dilaudid PRN. Bed in lowest position, call bell within reach. Will continue to monitor and report any changes.

## 2014-07-05 NOTE — Progress Notes (Addendum)
Surgery Daily Progress Note  Surgery Team Plastic Surgery    Date/Time: 07/05/2014 6:36 AM  Bed:  FI252/FI252-01  Hospital Day: 13  4 Days Post-Op  Primary Procedure:   1. Intracranial free tissue transfer reconstruction, using vastus  lateralis free-flap.  2. Complex closure of the scalp, 22 cm.  3. Reconstruction of scalp using Integra dermal matrix, measuring roughly  an area of 6 x 12 cm.  4. Nasal endoscopy by Dr. Dorinda Hill.   5. Placement of On-Q pump, left thigh    Interval History:   No acute issues overnight. Feels like pain is downtrending overall. Swelling somewhat improved. Afebrile, doing well.    Diet: Diet consistent carbohydrate  Juven Quantity:: A. One; Frequency:: BID with meals     Active Lines, Drains and Airways     Name:   Placement date:   Placement time:   Site:   Days:    PICC Single Lumen 06/26/14 Right Basilic  06/26/14   1517   Basilic   8    OnQ Pump (Incisional) 07/01/14 left thigh Left  07/01/14      left thigh   4    Closed/Suction Drain Left Thigh Bulb 19 Fr.  07/01/14   1520   Thigh   3    Closed/Suction Drain Right Scalp Bulb 15 Fr.  07/01/14   1646   Scalp   3          Medications:     Current Facility-Administered Medications   Medication Dose Route Frequency   . aspirin  325 mg Oral Daily   . cefepime  2 g Intravenous Q8H SCH   . enoxaparin  40 mg Subcutaneous Q24H   . gabapentin  300 mg Oral 6X Daily   . insulin glargine  38 Units Subcutaneous QHS   . metFORMIN  1,000 mg Oral BID Meals   . metroNIDAZOLE  500 mg Intravenous Q8H SCH   . oxyCODONE  10 mg Oral Q12H SCH   . simvastatin  40 mg Oral QHS     . bupivacaine 0.25% 2 mL/hr (07/01/14 1739)     dextrose, diazepam, diphenhydrAMINE, glucagon (rDNA), hydrALAZINE, ibuprofen, insulin aspart, oxyCODONE     Physical Exam:   Current Vitals:   Filed Vitals:    07/05/14 0419   BP: 141/77   Pulse: 87   Temp: 99.9 F (37.7 C)   Resp: 18   SpO2: 91%     Vital signs in last 24 hours:Temp:  [98 F (36.7 C)-99.9 F (37.7 C)] 99.9 F  (37.7 C)  Heart Rate:  [70-102] 87  Resp Rate:  [11-41] 18  BP: (132-181)/(67-90) 141/77 mmHg  Body mass index is 42.04 kg/(m^2).    Intake and Output Summary (Last 24 hours):  I/O last 3 completed shifts:  In: 3430 [P.O.:1080; IV Piggyback:2350]  Out: 3670 [Urine:3555; Drains:115]    PHYSICAL EXAM:  General:AAOx3, NAD  HEENT: scalp incision with staples in place, partially closed, minimal amt ss drainage, drain in place with minimal ss output (15cc/12hr), flaps with intact arterial/venous doppler signals, minimal infraorbital swelling  CV: RRR, no murmurs, rubs, gallops  Pulm: CTAB, no wheezes, rhonci  Extremities: LLE incision c/d/i, JP in place with minimal output (35cc/12hr), on-q in place  Neuro: Sensation intact throughout, CN 2-12 intact, gross motor intact      Labs:     Hematology   Recent Labs      07/04/14   0443  07/03/14   1341  WBC  9.14  12.16*   HEMOGLOBIN  11.3*  11.9*   HEMATOCRIT  35.1*  36.6*   PLATELETS  230  244      Chemistry   Recent Labs      07/04/14   0443  07/03/14   1341   SODIUM  139  140   POTASSIUM  4.0  4.0   CHLORIDE  108  107   CO2  25  25   BUN  10.0  11.0   CREATININE  0.7  0.8   GLUCOSE  141*  154*   CALCIUM  7.9*  8.2*   MAGNESIUM  1.9  2.1   PHOSPHORUS  2.9  2.0*      Coagulation   No results for input(s): PT, INR, PTT in the last 72 hours.   Liver Function Tests   No results for input(s): AST, ALT, ALKPHOS, PROT, ALB, BILITOTAL, BILIDIRECT, LIP, AMY, PREALB in the last 72 hours.     Cultures:  None     Rads:   Radiological Procedure reviewed.  No results found.      Assessment:   [name] Brown is a 48 y.o. male hx HTN, DM, brain abscess s/p craniotomy 09/2013 now w recurrent brain abscess POD 4 s/p procedure noted above    Plan:   Continue flap checks q4hr with continuous flow monitors - arterial monitor remaining; venous monitor broken on transport  Continue q4hr neurovascular checks to LLE; elevate as tolerated  Avoid any compression to the forehead  OOB to chair as  able  Continue asa 325 and lovenox 40qd  Continue abx per ID; currently vanc/cefepime/flagyl  Change xeroform in scalp incision daily (securing staples removed yesterday)  Monitor drain output for possible d/c (35/15 cc/12hr currently too high)    M. Everlena Cooper, MD  General Surgery PGY-2  Pager: [phone]

## 2014-07-05 NOTE — Progress Notes (Signed)
ID PROGRESS NOTE    Date Time: 07/05/2014 1:47 PM  Patient Name: [facility]      Problem List:    Acute  Worsening headaches  New changes in MRI suggesting abscess in R cribriform plate/ R frontal lobe and medial R orbit/ medial rectus muscle  Prior MSSA brain abscess in 09/2013, treated with drainage and IV abx  -S/P combine neurosurgery and plastics intervention/ craniotomy 9/5 for repair of defect.   -- No pus, but phlegmon noted and sent for pathology (cx negative).  Path unremarkable.  Diarrhea chronic  -------------------------------------  Chronic conditions  Diabetes mellitus  Sleep apnea  Extensive history of recurrent sinusitis  -- multiple sinus surgeries, including at least two craniotomies  Antibiotic allergies:  Penicillin listed but tolerating cefepime August/September 2015    Assessment:   On treatment for a brain phlegmon s/p craniotomy and flap repair on 9/5.  Intra-op cultures are negative, but he had been on IV antibiotics prior to surgery.  MRSA coverage d/c'ed on 9/8.      Antibiotics:   #13 Cefepime 2 gm IV q 8H  #13 Metronidazole 500mg  IV q 8H    Plan:   Continue cefepime and metronidazole, several weeks are planned.    Lines:     Active PICC Line / CVC Line / PIV Line / Drain / Airway / Intraosseous Line / Epidural Line / ART Line / Line / Wound / Pressure Ulcer / NG/OG Tube     Name:   Placement date:   Placement time:   Site:   Days:    PICC Single Lumen 06/26/14 Right Basilic  06/26/14   1517   Basilic   8    OnQ Pump (Incisional) 07/01/14 left thigh Left  07/01/14      left thigh   4    Closed/Suction Drain Left Thigh Bulb 19 Fr.  07/01/14   1520   Thigh   3    Closed/Suction Drain Right Scalp Bulb 15 Fr.  07/01/14   1646   Scalp   3    Incision Site 07/01/14 Leg Left  07/01/14   1522     3    Incision Site 07/01/14 Head Other (Comment)  07/01/14   1643     3          *I have performed a risk-benefit analysis and the patient needs a central line for access and IV medications    Family  History:     Family History   Problem Relation Age of Onset   . Stroke Neg Hx        Social History:     History     Social History   . Marital Status: Married     Spouse Name: N/A     Number of Children: N/A   . Years of Education: N/A     Social History Main Topics   . Smoking status: Light Tobacco Smoker     Types: Cigars   . Smokeless tobacco: Never Used      Comment: Occasional Cigars   . Alcohol Use: 0.0 oz/week     4-5 Cans of beer per week   . Drug Use: No   . Sexual Activity: Not on file     Other Topics Concern   . Not on file     Social History Narrative       Allergies:     Allergies   Allergen Reactions   . Morphine  Pt stated the medication "burned" when given through an IV   . Penicillins       "I have never taken it, but I am allergic to it":       Review of Systems:   Afebrile  No diarrhea    Physical Exam:     Filed Vitals:    07/05/14 1124   BP: 138/72   Pulse: 72   Temp: 98 F (36.7 C)   Resp: 19   SpO2: 95%       General Appearance: alert and appropriate, non-toxic  Neuro: alert, oriented, normal speech, normal attention and cognition  HEENT:  wound closed on the right, open on the left, packing in place, some facial swelling  Lungs: clear to auscultation, no wheezes, rales or rhonchi   Cardiac: normal rate, regular rhythm   Abdomen: soft, non-tender     Labs:     Lab Results   Component Value Date    WBC 9.14 07/04/2014    HGB 11.3* 07/04/2014    HCT 35.1* 07/04/2014    MCV 90.9 07/04/2014    PLT 230 07/04/2014     Lab Results   Component Value Date    CREAT 0.7 07/04/2014     Lab Results   Component Value Date    ALT 99* 07/01/2014    AST 73* 07/01/2014    ALKPHOS 51 07/01/2014    BILITOTAL 0.5 07/01/2014     No results found for: LACTATE    Microbiology:     Microbiology Results     Procedure Component Value Units Date/Time    AFB culture and smear [[id number]] Collected:  07/01/14 1303    Specimen Information:  Sputum / Tissue Updated:  07/02/14 0854    Narrative:      ORDER#: [id number]                                     ORDERED BY: LIM, JAE  SOURCE: Tissue skull (right ethimoid bone)           COLLECTED:  07/01/14 13:03  ANTIBIOTICS AT COLL.:                                RECEIVED :  07/01/14 19:27  Stain, Acid Fast                           FINAL       07/02/14 08:54  07/02/14   No Acid Fast Bacillus Seen  Culture Acid Fast Bacillus (AFB)           PENDING      AFB culture and smear [[id number]] Collected:  07/01/14 1303    Specimen Information:  Sputum / Tissue Updated:  07/02/14 0854    Narrative:      ORDER#: [id number]                                    ORDERED BY: LIM, JAE  SOURCE: Tissue right Ethimoid                        COLLECTED:  07/01/14 13:03  ANTIBIOTICS AT COLL.:  RECEIVED :  07/01/14 19:23  Stain, Acid Fast                           FINAL       07/02/14 08:54  07/02/14   No Acid Fast Bacillus Seen  Culture Acid Fast Bacillus (AFB)           PENDING      Anaerobic culture [[id number]] Collected:  07/01/14 1303    Specimen Information:  Other / Tissue Updated:  07/05/14 0803    Narrative:      ORDER#: [id number]                                    ORDERED BY: LIM, JAE  SOURCE: Tissue right ethomoid                        COLLECTED:  07/01/14 13:03  ANTIBIOTICS AT COLL.:                                RECEIVED :  07/01/14 19:23  Culture, Anaerobic Bacteria                FINAL       07/05/14 08:03  07/05/14   No anaerobic growth      Anaerobic culture [[id number]] Collected:  07/01/14 1303    Specimen Information:  Other / Bone Updated:  07/05/14 0803    Narrative:      ORDER#: [id number]                                    ORDERED BY: LIM, JAE  SOURCE: Bone Right ethimoid bone                     COLLECTED:  07/01/14 13:03  ANTIBIOTICS AT COLL.:                                RECEIVED :  07/01/14 19:27  Culture, Anaerobic Bacteria                FINAL       07/05/14 08:03  07/05/14   No anaerobic growth      CULTURE BLOOD AEROBIC AND ANAEROBIC [[id number]]  Collected:  06/23/14 2124    Specimen Information:  Blood, Venipuncture Updated:  06/29/14 0121    Narrative:      ORDER#: [id number]                                    ORDERED BY: Candy Sledge  SOURCE: Blood, Venipuncture rue                      COLLECTED:  06/23/14 21:24  ANTIBIOTICS AT COLL.:                                RECEIVED :  06/23/14 23:00  Culture Blood Aerobic and Anaerobic        FINAL  06/29/14 01:21  06/29/14   No growth after 5 days of incubation.      CULTURE BLOOD AEROBIC AND ANAEROBIC [[id number]] Collected:  06/23/14 2055    Specimen Information:  Blood, Venipuncture Updated:  06/29/14 0022    Narrative:      ORDER#: [id number]                                    ORDERED BY: Candy Sledge  SOURCE: Blood, Venipuncture lue                      COLLECTED:  06/23/14 20:55  ANTIBIOTICS AT COLL.:                                RECEIVED :  06/23/14 22:09  Culture Blood Aerobic and Anaerobic        FINAL       06/29/14 00:21  06/29/14   No growth after 5 days of incubation.      Fungus culture [[id number]] Collected:  07/01/14 1303    Specimen Information:  Other / Bone Updated:  07/02/14 0845    Narrative:      ORDER#: [id number]                                    ORDERED BY: LIM, JAE  SOURCE: Bone right ethomoid bone                     COLLECTED:  07/01/14 13:03  ANTIBIOTICS AT COLL.:                                RECEIVED :  07/01/14 19:27  Stain, Fungal                              FINAL       07/02/14 08:45  07/02/14   No Fungal or Yeast Elements Seen  Culture Fungus                             PENDING      Fungus culture [[id number]] Collected:  07/01/14 1303    Specimen Information:  Other / Tissue Updated:  07/02/14 0845    Narrative:      ORDER#: [id number]                                    ORDERED BY: LIM, JAE  SOURCE: Tissue right ethimoid                        COLLECTED:  07/01/14 13:03  ANTIBIOTICS AT COLL.:                                RECEIVED :  07/01/14 19:23  Stain, Fungal  FINAL       07/02/14 08:45  07/02/14   No Fungal or Yeast Elements Seen  Culture Fungus                             PENDING      MRSA culture [[id number]] Collected:  07/01/14 2212    Specimen Information:  Body Fluid / Nasal/Throat ASC Admission Updated:  07/02/14 2340    Narrative:      ORDER#: [id number]                                    ORDERED BY: Daphane Shepherd, MEREDITH  SOURCE: Nares and Throat                             COLLECTED:  07/01/14 22:12  ANTIBIOTICS AT COLL.:                                RECEIVED :  07/02/14 02:32  Culture MRSA Surveillance                  FINAL       07/02/14 23:40  07/02/14   Negative for Methicillin Resistant Staph aureus from Nares and             Negative for Methicillin Resistant Staph aureus from Throat      Wound culture & gram stain [[id number]] Collected:  07/01/14 1303    Specimen Information:  Wound / Abscess Updated:  07/04/14 1712    Narrative:      ORDER#: [id number]                                    ORDERED BY: LIM, JAE  SOURCE: Abscess right ethomoid bone                  COLLECTED:  07/01/14 13:03  ANTIBIOTICS AT COLL.:                                RECEIVED :  07/01/14 19:27  Stain, Gram                                FINAL       07/01/14 20:35  07/01/14   No WBCs or organisms seen  Culture and Gram Stain, Aerobic, Wound     FINAL       07/04/14 17:12  07/04/14   No Growth      Wound culture & gram stain [[id number]] Collected:  07/01/14 1303    Specimen Information:  Wound / Abscess Updated:  07/04/14 1711    Narrative:      ORDER#: [id number]                                    ORDERED BY: LIM, JAE  SOURCE: Abscess right ethimoid                       COLLECTED:  07/01/14  13:03  ANTIBIOTICS AT COLL.:                                RECEIVED :  07/01/14 19:23  Stain, Gram                                FINAL       07/01/14 22:39  07/01/14   No WBCs or organisms seen  Culture and Gram Stain, Aerobic, Wound     FINAL       07/04/14 17:11  07/04/14    No Growth            Rads:   No results found.    Signed by: Marga Hoots, MD

## 2014-07-05 NOTE — Progress Notes (Signed)
Doing well, no complaints    Drain output from scalp decreased this shift  Drain removed easily    A/P:  Doing well  Flap viable, doppler strong  Cont PT, OOB  Local wound care  Xeroform to change tomorrow

## 2014-07-05 NOTE — Progress Notes (Signed)
[facility] HOSPITALIST PROGRESS NOTE    Date Time: 07/05/2014 10:40 AM  Patient Name: [facility]  Attending Physician: Tery Sanfilippo, *    Assessment:   1. Brain abscess with anterior fossa defect  2. 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 Extensive history of recurrent sinusitis  4. DM  5. OSA  6. HTN  7. HLD  8. Chronic headache  9. Hypophosphatemia     Plan:    Brain abscess with anterior fossa defect - S/P craniotomy and evacuation of abscess,  endoscopic sinus sphenoidotomy with navigation and reconstruction with Neuro/plastic/ENT team on 07/01/14. Will Continue with vancomycin, flagyl and cefepime (# 13). Continue Cefepime anf Flagyl for a 4 week course. Vanco stopped 06/04/14. Continue pain management. Wound care as per surgery team. Had PICC already. Blood cultures are NGTD. Wound culture NGTD. AFB and fungal cultures pending. 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 Extensive bony dehiscence right cribriform plate. Small area of bony dehiscence superomedial aspect of right orbit. F/U surgery and ENT teams. Repeat CT head on 9/7 show only postsurgical changes, no hydrocephalus.F/U with Plastic surgery, ID, ENT, and NS teams. Activity as tolerated to OOB, NSG cleared for discharge, just wanted Plastic surgery team to manage the tubes.    DM - optimally controlled. On lantus- 38 units and Metformin 1000 gram PO BID (used to get 86 units Sq daily along with Metformin 1000 mg PO BID just before surgery) and Novolog SS. Recent HbA1c- 6.3.    OSA - On home CPAP   Continue statin and ASA   Hypophosphatemia - K-phos 15 mm mol IV given and now stable level, replace it PRN    Case discussed with: pt and NS team/RN    Safety Checklist:     DVT prophylaxis:  CHEST guideline (See page [id number]) Mechanical and chemical    Foley:  Spencer Rn Foley protocol Not present   IVs:  Peripheral IV   PT/OT: Not needed   Daily CBC & or Chem ordered:  SHM/ABIM guidelines (see #5)  No       Lines:     Active PICC Line / CVC Line / PIV Line / Drain / Airway / Intraosseous Line / Epidural Line / ART Line / Line / Wound / Pressure Ulcer / NG/OG Tube     Name:   Placement date:   Placement time:   Site:   Days:    Peripheral IV 06/23/14 Left Forearm  06/23/14      Forearm   1           Disposition:     Today's date: 07/05/2014  Length of Stay: 12  Anticipated medical stability for discharge: 1-2 days   Reason for ongoing hospitalization: brain abscess   Anticipated discharge needs: TBD    Subjective     CC: Brain abscess    Interval History/24 hour events/subjective: c/o some pain, but much better today, no fever, no SOB.       HPI: [name] Brown is a 48 y.o. male w/ h/o DM type 2, chronic headache, OSA, HLD, sinusitis, HTN, brain abscess s/p craniotomy, iv Rocephin for MSSA who p/w recurrent brain abscess on recent MRI. Pt reports bilateral retroorbital headaches weekly at around 2-3am which wake him up. Pt reports intermittent photophobia but denies visual changes or N/V. Pt denies F/C/D/SOB/CP/abd pain or any other sx's. Pt was admitted to NSICU on 09/30/13, d/c'ed to rehab on 10/07/14. Pt was d/c'ed home  on 10/18/13.    Review of Systems:     See above    Physical Exam:     VITAL SIGNS PHYSICAL EXAM   Temp:  [98.2 F (36.8 C)-99.9 F (37.7 C)] 98.4 F (36.9 C)  Heart Rate:  [80-102] 89  Resp Rate:  [18-41] 18  BP: (132-181)/(67-90) 135/80 mmHg  Blood Glucose:    Telemetry: NSR      Intake/Output Summary (Last 24 hours) at 07/05/14 1040  Last data filed at 07/05/14 0500   Gross per 24 hour   Intake    400 ml   Output   1105 ml   Net   -705 ml    General: No apparent distress, awake, alert, comfortable appearing, vital signs noted. Incision over scalp- clean and dry with JP drain in right side.   Pupils- reactive, equal, no pallor, no icterus  Neck- supple, no mass, no bruits/JVP   Resp: Clear to ausculation, no wheezing, rhonci, or rales noted; normal respiratory effort   CV: Regular rate and  rhythm, no murmurs, gallops, or rubs; no peripheral edema   GI: Abdomen obese, soft, non-tender, no masses; no hepatosplenomegaly   Skin: Normal temperature, tone, turgor; no rash, lesions, ulcers   Musc: No digital cyanosis or ischemia   Neuro: AOx3, CN 2-12 grossly intact, power 5/5 allover, sensation intact, DTR +1         Meds:     Medications were reviewed:    Labs:     Labs (last 72 hours):      Recent Labs  Lab 07/04/14  0443 07/03/14  1341   WBC 9.14 12.16*   HEMOGLOBIN 11.3* 11.9*   HEMATOCRIT 35.1* 36.6*   PLATELETS 230 244         Recent Labs  Lab 06/30/14  0816   PT 14.2   PT INR 1.1      Recent Labs  Lab 07/04/14  0443 07/03/14  1341 07/01/14  1545   SODIUM 139 140 135*   POTASSIUM 4.0 4.0 4.4   CHLORIDE 108 107 106   CO2 25 25 18*   BUN 10.0 11.0 13.0   CREATININE 0.7 0.8 0.9   CALCIUM 7.9* 8.2* 7.8*   ALBUMIN  --   --  3.9   PROTEIN, TOTAL  --   --  6.5   BILIRUBIN, TOTAL  --   --  0.5   ALKALINE PHOSPHATASE  --   --  51   ALT  --   --  99*   AST (SGOT)  --   --  73*   GLUCOSE 141* 154* 204*                   Microbiology, reviewed and are significant for:  Microbiology Results     Procedure Component Value Units Date/Time    CULTURE BLOOD AEROBIC AND ANAEROBIC [[id number]] Collected:  06/23/14 2124    Specimen Information:  Blood, Venipuncture Updated:  06/23/14 2300    Narrative:      1 BLUE+1 PURPLE    CULTURE BLOOD AEROBIC AND ANAEROBIC [[id number]] Collected:  06/23/14 2055    Specimen Information:  Blood, Venipuncture Updated:  06/23/14 2209    Narrative:      1 BLUE+1 PURPLE          Imaging, reviewed and are significant for:  Radiology Results (24 Hour)     ** No results found for the last 24 hours. **  Signed by: Estanislado Pandy, MD

## 2014-07-06 DIAGNOSIS — E118 Type 2 diabetes mellitus with unspecified complications: Secondary | ICD-10-CM

## 2014-07-06 LAB — GLUCOSE WHOLE BLOOD - POCT
Whole Blood Glucose POCT: 135 mg/dL — ABNORMAL HIGH (ref 70–100)
Whole Blood Glucose POCT: 139 mg/dL — ABNORMAL HIGH (ref 70–100)
Whole Blood Glucose POCT: 118 mg/dL — ABNORMAL HIGH (ref 70–100)
Whole Blood Glucose POCT: 142 mg/dL — ABNORMAL HIGH (ref 70–100)

## 2014-07-06 NOTE — Progress Notes (Signed)
Pt is comfortably asleep    Af vss    Flap with good signals    Drain output low    A/p POD5  Will d/c internal doppler later today and then can tsfr to neuro floor  D/c drain today    Encourage ambulation pulm toilet    [name], MD [facility]  Plastic Surgery, [phone]

## 2014-07-06 NOTE — Progress Notes (Signed)
Internal doppler monitors removed without issue, clipped at skin. L thigh drain removed without issue. Patient tolerated well. Will plan for transfer to neuro floor later today.    Stasia Cavalier, MD  General Surgery PGY-2  Pager: [phone]

## 2014-07-06 NOTE — Plan of Care (Signed)
Pt alert and oriented x4. VS stable. NSR on monitor. Pt on RA.    Neurocheck every 4 hours. Staples from one ear to the other one. L scalp incision has xeroform in place, changed it daily per order. Steri strips covers the incision on left thigh. JP and internal doppler removed by surgery resident today.    Tolerated diet well. Complained of moderate pain on left thigh where the incision is. Pt okay controlled with current pain meds.    Pt needs minimal assist for ambulation and moving around. Voided adequate amount in the bathroom. BM 2 days ago. PICC line on left arm and changed dressing today.  Pt on IV Abx. Pt was not seen by PT and OT because pt was in moderate pain and bad mood.     PICC line dressing change is due today and it still needs to be done, because pt has refused the nurse to change the dressing.     Continue to monitor pt.

## 2014-07-06 NOTE — OT Progress Note (Signed)
Occupational Therapy Cancellation Note    Patient: [name] Brown  ZOX:[id number]    Unit: [id number]    Patient not seen for occupational therapy secondary to RN declines at this time, pt with increased pain. Will continue to follow.    Jacalyn Lefevre, MS, OTR/L  Pager [phone]  Time 3:25 pm

## 2014-07-06 NOTE — Progress Notes (Signed)
[facility] HOSPITALIST PROGRESS NOTE    Date Time: 07/06/2014 6:02 PM  Patient Name: [name]  Attending Physician: Tery Sanfilippo, *    Assessment:   1. Brain abscess with anterior fossa defect  2. 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 Extensive history of recurrent sinusitis  4. DM  5. OSA  6. HTN  7. HLD  8. Chronic headache  9. Hypophosphatemia     Plan:    Brain abscess with anterior fossa defect - S/P craniotomy and evacuation of abscess,  endoscopic sinus sphenoidotomy with navigation and reconstruction with Neuro/plastic/ENT team on 07/01/14. He is on vancomycin, flagyl and cefepime (# 14). Continue Cefepime anf Flagyl for a 4 week course per ID. Vanco stopped 06/04/14. Continue pain management. Wound care as per surgery team, plastic team took out the drain from the head. Had PICC already. Blood cultures are NGTD. Wound culture NGTD. AFB and fungal cultures pending. F/U with Plastic surgery, ID, ENT, and NS teams. Activity as tolerated to OOB, NSG cleared for discharge, just wanted Plastic surgery team to manage the tubes.    DM - optimally controlled. On lantus- 38 units and Metformin 1000 gram PO BID (used to get 86 units Sq daily along with Metformin 1000 mg PO BID just before surgery) and Novolog SS. Recent HbA1c- 6.3.    OSA - not on CPAP, he is requesting outpatient follow up , will give referral for outpatient follow up on [place].    Continue statin and ASA   Hypophosphatemia - K-phos 15 mm mol IV given and now stable level, replace it PRN.     Case discussed with: pt and NS team/RN    Safety Checklist:     DVT prophylaxis:  CHEST guideline (See page [id number]) Mechanical and chemical    Foley:  Wilson Rn Foley protocol Not present   IVs:  Peripheral IV   PT/OT: Not needed   Daily CBC & or Chem ordered:  SHM/ABIM guidelines (see #5) No       Lines:     Active PICC Line / CVC Line / PIV Line / Drain / Airway / Intraosseous Line / Epidural Line / ART Line / Line / Wound / Pressure Ulcer / NG/OG  Tube     Name:   Placement date:   Placement time:   Site:   Days:    Peripheral IV 06/23/14 Left Forearm  06/23/14      Forearm   1           Disposition:     Today's date: 07/06/2014  Length of Stay: 13  Anticipated medical stability for discharge: 1-2 days   Reason for ongoing hospitalization: brain abscess   Anticipated discharge needs: TBD    Subjective     CC: Brain abscess    Interval History/24 hour events/subjective: pain, swelling on the left scalp bigger than yesterday, no fever.     HPI: [name] Brown is a 48 y.o. male w/ h/o DM type 2, chronic headache, OSA, HLD, sinusitis, HTN, brain abscess s/p craniotomy, iv Rocephin for MSSA who p/w recurrent brain abscess on recent MRI. Pt reports bilateral retroorbital headaches weekly at around 2-3am which wake him up. Pt reports intermittent photophobia but denies visual changes or N/V. Pt denies F/C/D/SOB/CP/abd pain or any other sx's. Pt was admitted to NSICU on 09/30/13, d/c'ed to rehab on 10/07/14. Pt was d/c'ed home on 10/18/13.    Review of Systems:     See above  Physical Exam:     VITAL SIGNS PHYSICAL EXAM   Temp:  [97.5 F (36.4 C)-98.7 F (37.1 C)] 97.7 F (36.5 C)  Heart Rate:  [76-88] 77  Resp Rate:  [18] 18  BP: (123-148)/(66-86) 123/66 mmHg  Blood Glucose:    Telemetry: NSR      Intake/Output Summary (Last 24 hours) at 07/06/14 1802  Last data filed at 07/06/14 1700   Gross per 24 hour   Intake   1200 ml   Output   22.5 ml   Net 1177.5 ml    General: No apparent distress, awake, alert, comfortable appearing, vital signs noted. Incision over scalp- clean and dry with JP drain in right side.   Pupils- reactive, equal, no pallor, no icterus  Neck- supple, no mass, no bruits/JVP   Resp: Clear to ausculation, no wheezing, rhonci, or rales noted; normal respiratory effort   CV: Regular rate and rhythm, no murmurs, gallops, or rubs; no peripheral edema   GI: Abdomen obese, soft, non-tender, no masses; no hepatosplenomegaly   Skin: Normal temperature,  tone, turgor; no rash, lesions, ulcers   Musc: No digital cyanosis or ischemia   Neuro: AOx3, CN 2-12 grossly intact, power 5/5 allover, sensation intact, DTR +1         Meds:     Medications were reviewed:    Labs:     Labs (last 72 hours):      Recent Labs  Lab 07/04/14  0443 07/03/14  1341   WBC 9.14 12.16*   HEMOGLOBIN 11.3* 11.9*   HEMATOCRIT 35.1* 36.6*   PLATELETS 230 244         Recent Labs  Lab 06/30/14  0816   PT 14.2   PT INR 1.1      Recent Labs  Lab 07/04/14  0443 07/03/14  1341 07/01/14  1545   SODIUM 139 140 135*   POTASSIUM 4.0 4.0 4.4   CHLORIDE 108 107 106   CO2 25 25 18*   BUN 10.0 11.0 13.0   CREATININE 0.7 0.8 0.9   CALCIUM 7.9* 8.2* 7.8*   ALBUMIN  --   --  3.9   PROTEIN, TOTAL  --   --  6.5   BILIRUBIN, TOTAL  --   --  0.5   ALKALINE PHOSPHATASE  --   --  51   ALT  --   --  99*   AST (SGOT)  --   --  73*   GLUCOSE 141* 154* 204*                   Microbiology, reviewed and are significant for:  Microbiology Results     Procedure Component Value Units Date/Time    CULTURE BLOOD AEROBIC AND ANAEROBIC [[id number]] Collected:  06/23/14 2124    Specimen Information:  Blood, Venipuncture Updated:  06/23/14 2300    Narrative:      1 BLUE+1 PURPLE    CULTURE BLOOD AEROBIC AND ANAEROBIC [[id number]] Collected:  06/23/14 2055    Specimen Information:  Blood, Venipuncture Updated:  06/23/14 2209    Narrative:      1 BLUE+1 PURPLE          Imaging, reviewed and are significant for:  Radiology Results (24 Hour)     ** No results found for the last 24 hours. **            Signed by: Tery Sanfilippo, MD

## 2014-07-06 NOTE — Progress Notes (Signed)
Cultures from ethmoid show no organisms, no growth.  I asked pt to followup in office.  We will try varying regimen to maintain nasal cavity hygiene.

## 2014-07-06 NOTE — Progress Notes (Signed)
ID PROGRESS NOTE    Date Time: 07/06/2014 10:09 AM  Patient Name: [name] Brown      Problem List:    Acute  Worsening headaches  New changes in MRI suggesting abscess in R cribriform plate/ R frontal lobe and medial R orbit/ medial rectus muscle  Prior MSSA brain abscess in 09/2013, treated with drainage and IV abx  -S/P combine neurosurgery and plastics intervention/ craniotomy 9/5 for repair of defect.   -- No pus, but phlegmon noted and sent for pathology (cx negative). Path unremarkable.  Diarrhea chronic  -------------------------------------  Chronic conditions  Diabetes mellitus  Sleep apnea  Extensive history of recurrent sinusitis  -- multiple sinus surgeries, including at least two craniotomies  Antibiotic allergies: Penicillin listed but tolerating cefepime August/September 2015    Assessment:   On treatment for a brain phlegmon s/p craniotomy and flap repair on 9/5. Intra-op cultures are negative, but he had been on IV antibiotics prior to surgery. MRSA coverage d/c'ed on 9/8.  Flap with good signals per plastic surgery (07/06/14).    Antibiotics:   #14 Cefepime 2 gm IV q 8H  #14 Metronidazole 500mg  IV q 8H    Plan:   Continue cefepime and metronidazole, several weeks are planned.    Lines:     Active PICC Line / CVC Line / PIV Line / Drain / Airway / Intraosseous Line / Epidural Line / ART Line / Line / Wound / Pressure Ulcer / NG/OG Tube     Name:   Placement date:   Placement time:   Site:   Days:    PICC Single Lumen 06/26/14 Right Basilic  06/26/14   1517   Basilic   9    Closed/Suction Drain Left Thigh Bulb 19 Fr.  07/01/14   1520   Thigh   4    Incision Site 07/01/14 Leg Left  07/01/14   1522     4    Incision Site 07/01/14 Head Other (Comment)  07/01/14   1643     4          *I have performed a risk-benefit analysis and the patient needs a central line for access and IV medications    Family History:     Family History   Problem Relation Age of Onset   . Stroke Neg Hx        Social History:      History     Social History   . Marital Status: Married     Spouse Name: N/A     Number of Children: N/A   . Years of Education: N/A     Social History Main Topics   . Smoking status: Light Tobacco Smoker     Types: Cigars   . Smokeless tobacco: Never Used      Comment: Occasional Cigars   . Alcohol Use: 0.0 oz/week     4-5 Cans of beer per week   . Drug Use: No   . Sexual Activity: Not on file     Other Topics Concern   . Not on file     Social History Narrative       Allergies:     Allergies   Allergen Reactions   . Morphine      Pt stated the medication "burned" when given through an IV   . Penicillins       "I have never taken it, but I am allergic to it":  Tolerating cefepime August/September 2015  Review of Systems:   Afebrile  No acute overnight events.    Physical Exam:     Filed Vitals:    07/06/14 0436   BP: 148/80   Pulse:    Temp: 97.5 F (36.4 C)   Resp: 18   SpO2: 98%       General Appearance: alert and appropriate, non-toxic  Neuro: alert, oriented, normal speech, normal attention and cognition  HEENT: wound  open on the left, packing in place, some facial swelling  Lungs: clear to auscultation, no wheezes, rales or rhonchi   Cardiac: normal rate, regular rhythm   Abdomen: soft, non-tender     Labs:     Lab Results   Component Value Date    WBC 9.14 07/04/2014    HGB 11.3* 07/04/2014    HCT 35.1* 07/04/2014    MCV 90.9 07/04/2014    PLT 230 07/04/2014     Lab Results   Component Value Date    CREAT 0.7 07/04/2014     Lab Results   Component Value Date    ALT 99* 07/01/2014    AST 73* 07/01/2014    ALKPHOS 51 07/01/2014    BILITOTAL 0.5 07/01/2014     No results found for: LACTATE    Microbiology:     Microbiology Results     Procedure Component Value Units Date/Time    AFB culture and smear [[id number]] Collected:  07/01/14 1303    Specimen Information:  Sputum / Tissue Updated:  07/02/14 0854    Narrative:      ORDER#: [id number]                                    ORDERED BY: LIM, JAE  SOURCE:  Tissue skull (right ethimoid bone)           COLLECTED:  07/01/14 13:03  ANTIBIOTICS AT COLL.:                                RECEIVED :  07/01/14 19:27  Stain, Acid Fast                           FINAL       07/02/14 08:54  07/02/14   No Acid Fast Bacillus Seen  Culture Acid Fast Bacillus (AFB)           PENDING      AFB culture and smear [[id number]] Collected:  07/01/14 1303    Specimen Information:  Sputum / Tissue Updated:  07/02/14 0854    Narrative:      ORDER#: [id number]                                    ORDERED BY: LIM, JAE  SOURCE: Tissue right Ethimoid                        COLLECTED:  07/01/14 13:03  ANTIBIOTICS AT COLL.:                                RECEIVED :  07/01/14 19:23  Stain, Acid Fast  FINAL       07/02/14 08:54  07/02/14   No Acid Fast Bacillus Seen  Culture Acid Fast Bacillus (AFB)           PENDING      Anaerobic culture [[id number]] Collected:  07/01/14 1303    Specimen Information:  Other / Tissue Updated:  07/05/14 0803    Narrative:      ORDER#: [id number]                                    ORDERED BY: LIM, JAE  SOURCE: Tissue right ethomoid                        COLLECTED:  07/01/14 13:03  ANTIBIOTICS AT COLL.:                                RECEIVED :  07/01/14 19:23  Culture, Anaerobic Bacteria                FINAL       07/05/14 08:03  07/05/14   No anaerobic growth      Anaerobic culture [[id number]] Collected:  07/01/14 1303    Specimen Information:  Other / Bone Updated:  07/05/14 0803    Narrative:      ORDER#: [id number]                                    ORDERED BY: LIM, JAE  SOURCE: Bone Right ethimoid bone                     COLLECTED:  07/01/14 13:03  ANTIBIOTICS AT COLL.:                                RECEIVED :  07/01/14 19:27  Culture, Anaerobic Bacteria                FINAL       07/05/14 08:03  07/05/14   No anaerobic growth      CULTURE BLOOD AEROBIC AND ANAEROBIC [[id number]] Collected:  06/23/14 2124    Specimen Information:  Blood,  Venipuncture Updated:  06/29/14 0121    Narrative:      ORDER#: [id number]                                    ORDERED BY: Candy Sledge  SOURCE: Blood, Venipuncture rue                      COLLECTED:  06/23/14 21:24  ANTIBIOTICS AT COLL.:                                RECEIVED :  06/23/14 23:00  Culture Blood Aerobic and Anaerobic        FINAL       06/29/14 01:21  06/29/14   No growth after 5 days of incubation.      CULTURE BLOOD AEROBIC AND ANAEROBIC [[id number]] Collected:  06/23/14 2055    Specimen Information:  Blood,  Venipuncture Updated:  06/29/14 0022    Narrative:      ORDER#: [id number]                                    ORDERED BY: Candy Sledge  SOURCE: Blood, Venipuncture lue                      COLLECTED:  06/23/14 20:55  ANTIBIOTICS AT COLL.:                                RECEIVED :  06/23/14 22:09  Culture Blood Aerobic and Anaerobic        FINAL       06/29/14 00:21  06/29/14   No growth after 5 days of incubation.      Fungus culture [[id number]] Collected:  07/01/14 1303    Specimen Information:  Other / Bone Updated:  07/02/14 0845    Narrative:      ORDER#: [id number]                                    ORDERED BY: LIM, JAE  SOURCE: Bone right ethomoid bone                     COLLECTED:  07/01/14 13:03  ANTIBIOTICS AT COLL.:                                RECEIVED :  07/01/14 19:27  Stain, Fungal                              FINAL       07/02/14 08:45  07/02/14   No Fungal or Yeast Elements Seen  Culture Fungus                             PENDING      Fungus culture [[id number]] Collected:  07/01/14 1303    Specimen Information:  Other / Tissue Updated:  07/02/14 0845    Narrative:      ORDER#: [id number]                                    ORDERED BY: LIM, JAE  SOURCE: Tissue right ethimoid                        COLLECTED:  07/01/14 13:03  ANTIBIOTICS AT COLL.:                                RECEIVED :  07/01/14 19:23  Stain, Fungal                              FINAL       07/02/14  08:45  07/02/14   No Fungal or Yeast Elements Seen  Culture Fungus  PENDING      MRSA culture [[id number]] Collected:  07/01/14 2212    Specimen Information:  Body Fluid / Nasal/Throat ASC Admission Updated:  07/02/14 2340    Narrative:      ORDER#: [id number]                                    ORDERED BY: Daphane Shepherd, MEREDITH  SOURCE: Nares and Throat                             COLLECTED:  07/01/14 22:12  ANTIBIOTICS AT COLL.:                                RECEIVED :  07/02/14 02:32  Culture MRSA Surveillance                  FINAL       07/02/14 23:40  07/02/14   Negative for Methicillin Resistant Staph aureus from Nares and             Negative for Methicillin Resistant Staph aureus from Throat      Wound culture & gram stain [[id number]] Collected:  07/01/14 1303    Specimen Information:  Wound / Abscess Updated:  07/04/14 1712    Narrative:      ORDER#: [id number]                                    ORDERED BY: LIM, JAE  SOURCE: Abscess right ethomoid bone                  COLLECTED:  07/01/14 13:03  ANTIBIOTICS AT COLL.:                                RECEIVED :  07/01/14 19:27  Stain, Gram                                FINAL       07/01/14 20:35  07/01/14   No WBCs or organisms seen  Culture and Gram Stain, Aerobic, Wound     FINAL       07/04/14 17:12  07/04/14   No Growth      Wound culture & gram stain [[id number]] Collected:  07/01/14 1303    Specimen Information:  Wound / Abscess Updated:  07/04/14 1711    Narrative:      ORDER#: [id number]                                    ORDERED BY: LIM, JAE  SOURCE: Abscess right ethimoid                       COLLECTED:  07/01/14 13:03  ANTIBIOTICS AT COLL.:                                RECEIVED :  07/01/14 19:23  Stain, Gram  FINAL       07/01/14 22:39  07/01/14   No WBCs or organisms seen  Culture and Gram Stain, Aerobic, Wound     FINAL       07/04/14 17:11  07/04/14   No Growth            Rads:   No results  found.    Signed by: Marga Hoots, MD

## 2014-07-06 NOTE — PT Progress Note (Signed)
[facility]   Physical Therapy Cancellation Note      Patient:  [name] Brown MRN#:  [id number]  [facility] Room/Bed:  FI252/FI252-01    07/06/2014  Time: 14:40      Pt not seen for physical therapy secondary to per nurse request, pt. In a lot of pain and very irritable. Will continue to follow.    Gerline Legacy, PT pager [phone], 07/06/2014 2:44 PM

## 2014-07-06 NOTE — Progress Notes (Signed)
Neurosurgery Progress Note  POD 5    S:  No new neuro complaints  Pt c/o left leg burning which was relieved with pain medications.   Reduced appetite.   OOB with assist and ambulating room    O:  Neuro Exam:  Awake, alert  Lying in bed in NAD  + facial swelling   Oriented X 3  PERRL  Speech is fluent, asking questions.  Follows commands briskly  Incision is clear    A/P:  - Wound management per plastics  - Abx per ID  - May d/c from NS standpoint  - Please f/u with Dr. Duanne Moron office in ~3 weeks

## 2014-07-07 LAB — GLUCOSE WHOLE BLOOD - POCT
Whole Blood Glucose POCT: 111 mg/dL — ABNORMAL HIGH (ref 70–100)
Whole Blood Glucose POCT: 106 mg/dL — ABNORMAL HIGH (ref 70–100)

## 2014-07-07 NOTE — Plan of Care (Signed)
Problem: Pain  Goal: Patient's pain/discomfort is manageable  Outcome: Progressing  Pt c/o incisional pain requesting 2 mg po Dilaudid and 400 mg po Ibuprofen with good relief.PT also on Neurontin per order cont to assess and observe.

## 2014-07-07 NOTE — Progress Notes (Addendum)
Home Health Referral          Referral from [facility] (Case Manager) for home health care upon discharge.    By [employer], the patient has the right to freely choose a home care provider.  Arrangements have been made with:     A company of the patients choosing. We have supplied the patient with a listing of providers in your area who asked to be included and participate in Medicare.   [facility], a home care agency that provides both adult home care services which is a wholly owned and operated by [employer] and participates in [employer]   The preferred provider of your insurance company. Choosing a home care provider other than your insurance company's preferred provider may affect your insurance coverage.    The Home Health Care Referral Form acknowledging the voluntary selection of the home care company has been completed, signed, and is on file.      Home Health Discharge Information     Your doctor has ordered Skilled Nursing and OtherIV antibiotic, Physical Therapy and Occupational Therapy in-home service(s) for you while you recuperate at home, to assist you in the transition from hospital to home.      The agency that you or your representative chose to provide the service:  [facility] [phone]      The Infusion Company:  Name of Infusion Company : [facility] [phone]]  Infusion: [facility]  Start on: Date:  07/08/14       The above services were set up by:  [facility]  [facility] Liaison)   Phone     [phone]                                              Signed by: Zorita Pang. Romeo Apple  Date Time: 07/07/2014 7:11 PM

## 2014-07-07 NOTE — Progress Notes (Signed)
NEUROSURGERY PROGRESS NOTE    Date Time: 07/07/2014 4:23 PM  Patient Name: [facility]  Consulting Attending Physician: Dr. Jaynie Collins   Covered By: Neurosurgery PA Deland Pretty [phone]    Interim History:   No new neuro complaints.    Medications:     Current Facility-Administered Medications   Medication Dose Route Frequency   . aspirin  325 mg Oral Daily   . cefepime  2 g Intravenous Q8H SCH   . enoxaparin  40 mg Subcutaneous Q24H   . gabapentin  300 mg Oral 6X Daily   . insulin glargine  38 Units Subcutaneous QHS   . metFORMIN  1,000 mg Oral BID Meals   . metroNIDAZOLE  500 mg Intravenous Q8H SCH   . oxyCODONE  10 mg Oral Q12H SCH   . simvastatin  40 mg Oral QHS         Physical Exam:     Filed Vitals:    07/07/14 1355   BP: 140/67   Pulse: 74   Temp: 98.5 F (36.9 C)   Resp: 18   SpO2: 95%       Intake and Output Summary (Last 24 hours) at Date Time    Intake/Output Summary (Last 24 hours) at 07/07/14 1623  Last data filed at 07/07/14 0115   Gross per 24 hour   Intake    950 ml   Output      1 ml   Net    949 ml       Neuro exam:  Awake, alert  Lying in bed in NAD  Oriented X 3  PERRL  Speech is fluent, asking questions.  Follows commands briskly  Incision is clear      Labs:     Lab Results   Component Value Date    WBC 9.14 07/04/2014    HGB 11.3* 07/04/2014    HCT 35.1* 07/04/2014    MCV 90.9 07/04/2014    PLT 230 07/04/2014     Lab Results   Component Value Date    NA 139 07/04/2014    K 4.0 07/04/2014    CL 108 07/04/2014    CO2 25 07/04/2014     Lab Results   Component Value Date    INR 1.1 06/30/2014    INR 1.0 09/30/2013    PT 14.2 06/30/2014    PT 12.9 09/30/2013       Rads:     Radiology Results (24 Hour)     ** No results found for the last 24 hours. **        A/P:  - Wound management per plastics  - Abx per ID  - May d/c from NS standpoint  - Please f/u with Dr. Duanne Moron office in ~3 weeks     Signed by: Rolinda Roan PA-C  Date/Time: 07/07/2014 4:23 PM

## 2014-07-07 NOTE — Progress Notes (Signed)
Received a call from [facility] charge nurse about talking to [name] Brown about his central line dressing which is dirty and overdue. He refused it because of his concerns about it being painful. He was teary and hesitant. He received pain medication recently but insisted that it would be very painful. After explaining that it is very important and can cause infection he was a little calmer. Agreed to have it done at night at bedtime after "a good dose of pain medication". Tried to convince the patient and that I would do it myself very slowly and be careful about removing the dressing and take extra time to make it as less painful as possible. But Patient refused to get it done now and kept postponing.

## 2014-07-07 NOTE — Plan of Care (Signed)
Pt alert and oriented x 3, no sob, vs stable, pain well controlled with meds. Internal dopplers were d/c'd today, neurochecks WNL. craniotomy incision dressed up with xeroform, no drainage, clean intact. RUA picc intact. On IV antibiotics for abscess, will be d/c'd home on antibiotics. Left thigh flap site with small dressing clean intact, no hematoma. Cleared by neurosurgery for discharge. Per plastics may transfer to neuro floor. Pt might dispo home this am if hemodynamically stable. Will continue to monitor.

## 2014-07-07 NOTE — Progress Notes (Signed)
[facility] HOSPITALIST PROGRESS NOTE    Date Time: 07/07/2014 6:53 PM  Patient Name: [name] Brown  Attending Physician: Tery Sanfilippo, *    Assessment:   1. Brain abscess with anterior fossa defect  2. 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 Extensive history of recurrent sinusitis  4. DM  5. OSA  6. HTN  7. HLD  8. Chronic headache  9. Hypophosphatemia     Plan:    Brain abscess with anterior fossa defect - S/P craniotomy and evacuation of abscess,  endoscopic sinus sphenoidotomy with navigation and reconstruction with Neuro/plastic/ENT team on 07/01/14. He is on flagyl and cefepime (# 15), he was on abx before surgery, per ID he will continue Cefepime anf Flagyl for a 4 week course per ID. Vanco stopped 06/04/14. Continue pain management. Drains and monitors are taken out by plastic surgery, wound seem stable, will have home health arrange and [place] in AM.    Generalized body pain - through out body due to surgery, pain meds are PRN, follow.    DM - optimally controlled. On lantus  38 units and Metformin 1000 gram PO BID (used to get 86 units Sq daily along with Metformin 1000 mg PO BID just before surgery) and Novolog SS. Recent HbA1c- 6.3.    OSA - not on CPAP, he is requesting outpatient follow up , will give referral for outpatient follow up on [place].    Continue statin and ASA   Hypophosphatemia - K-phos 15 mm mol IV given and now stable level, replace it PRN.     Case discussed with: CM, ID, NSG and plastics.     Safety Checklist:     DVT prophylaxis:  CHEST guideline (See page [id number]) Mechanical and chemical    Foley:  [place] Rn Foley protocol Not present   IVs:  Peripheral IV   PT/OT: Not needed   Daily CBC & or Chem ordered:  SHM/ABIM guidelines (see #5) No       Lines:     Active PICC Line / CVC Line / PIV Line / Drain / Airway / Intraosseous Line / Epidural Line / ART Line / Line / Wound / Pressure Ulcer / NG/OG Tube     Name:   Placement date:   Placement time:   Site:   Days:    Peripheral IV  06/23/14 Left Forearm  06/23/14      Forearm   1           Disposition:     Today's date: 07/07/2014  Length of Stay: 14  Anticipated medical stability for discharge: 1-2 days   Reason for ongoing hospitalization: brain abscess   Anticipated discharge needs: TBD    Subjective     CC: Brain abscess    Interval History/24 hour events/subjective: c/o pain, but feels better, drains taken.     HPI: [name] Brown is a 48 y.o. male w/ h/o DM type 2, chronic headache, OSA, HLD, sinusitis, HTN, brain abscess s/p craniotomy, iv Rocephin for MSSA who p/w recurrent brain abscess on recent MRI. Pt reports bilateral retroorbital headaches weekly at around 2-3am which wake him up. Pt reports intermittent photophobia but denies visual changes or N/V. Pt denies F/C/D/SOB/CP/abd pain or any other sx's. Pt was admitted to NSICU on 09/30/13, d/c'ed to rehab on 10/07/14. Pt was d/c'ed home on 10/18/13.    Review of Systems:     See above    Physical Exam:     VITAL SIGNS  PHYSICAL EXAM   Temp:  [97.9 F (36.6 C)-98.8 F (37.1 C)] 98.5 F (36.9 C)  Heart Rate:  [64-85] 74  Resp Rate:  [16-18] 18  BP: (118-158)/(59-86) 140/67 mmHg  Blood Glucose:    Telemetry: NSR      Intake/Output Summary (Last 24 hours) at 07/07/14 1853  Last data filed at 07/07/14 1700   Gross per 24 hour   Intake   1070 ml   Output      1 ml   Net   1069 ml    General: No apparent distress, awake, alert, comfortable appearing, vital signs noted. Incision over scalp- clean and dry   Pupils- reactive, equal, no pallor, no icterus  Neck- supple, no mass, no bruits/JVP   Resp: Clear to ausculation, no wheezing, rhonci, or rales noted; normal respiratory effort   CV: Regular rate and rhythm, no murmurs, gallops, or rubs; no peripheral edema   GI: Abdomen obese, soft, non-tender, no masses; no hepatosplenomegaly   Skin: Normal temperature, tone, turgor; no rash, lesions, ulcers   Musc: No digital cyanosis or ischemia   Neuro: AOx3, CN 2-12 grossly intact, power 5/5 allover,  sensation intact, DTR +1         Meds:     Medications were reviewed:    Labs:     Labs (last 72 hours):      Recent Labs  Lab 07/04/14  0443 07/03/14  1341   WBC 9.14 12.16*   HEMOGLOBIN 11.3* 11.9*   HEMATOCRIT 35.1* 36.6*   PLATELETS 230 244       No results for input(s): PT, INR, PTT in the last 168 hours.   Recent Labs  Lab 07/04/14  0443 07/03/14  1341 07/01/14  1545   SODIUM 139 140 135*   POTASSIUM 4.0 4.0 4.4   CHLORIDE 108 107 106   CO2 25 25 18*   BUN 10.0 11.0 13.0   CREATININE 0.7 0.8 0.9   CALCIUM 7.9* 8.2* 7.8*   ALBUMIN  --   --  3.9   PROTEIN, TOTAL  --   --  6.5   BILIRUBIN, TOTAL  --   --  0.5   ALKALINE PHOSPHATASE  --   --  51   ALT  --   --  99*   AST (SGOT)  --   --  73*   GLUCOSE 141* 154* 204*                   Microbiology, reviewed and are significant for:  Microbiology Results     Procedure Component Value Units Date/Time    CULTURE BLOOD AEROBIC AND ANAEROBIC [[id number]] Collected:  06/23/14 2124    Specimen Information:  Blood, Venipuncture Updated:  06/23/14 2300    Narrative:      1 BLUE+1 PURPLE    CULTURE BLOOD AEROBIC AND ANAEROBIC [[id number]] Collected:  06/23/14 2055    Specimen Information:  Blood, Venipuncture Updated:  06/23/14 2209    Narrative:      1 BLUE+1 PURPLE          Imaging, reviewed and are significant for:  Radiology Results (24 Hour)     ** No results found for the last 24 hours. **            Signed by: Tery Sanfilippo, MD

## 2014-07-07 NOTE — Plan of Care (Signed)
Pt alert and oriented x 3, no sob, vs stable, pain well controlled with PO dilaudid and Roxicodone as well as scheduled Oxycontin. Right UA PICC line dressing changed tonight. Remains on IV antibiotics per ID. Left side of the head has an open would which is to be dressed once a day and as needed with xeroform, remains intact now. Neurovascular checks WNL. Pt to be discharged home in am with home antibiotics. Encouraged pt to ambulate, will continue to monitor him for hemodynamic stability.

## 2014-07-07 NOTE — Progress Notes (Signed)
Surgery Daily Progress Note  Surgery Team Plastic Surgery    Date/Time: 07/07/2014 8:13 AM  Bed:  FI252/FI252-01  Hospital Day: 15  6 Days Post-Op  Primary Procedure:   1. Intracranial free tissue transfer reconstruction, using vastus  lateralis free-flap.  2. Complex closure of the scalp, 22 cm.  3. Reconstruction of scalp using Integra dermal matrix, measuring roughly  an area of 6 x 12 cm.  4. Nasal endoscopy by Dr. Dorinda Hill.   5. Placement of On-Q pump, left thigh    Interval History:   No acute issues overnight. Drains and internal doppler d/c'd yesterday. Afebrile, ambulating more.   Diet: Diet consistent carbohydrate  Juven Quantity:: A. One; Frequency:: BID with meals     Active Lines, Drains and Airways     Name:   Placement date:   Placement time:   Site:   Days:    PICC Single Lumen 06/26/14 Right Basilic  06/26/14   1517   Basilic   10    Closed/Suction Drain Left Thigh Bulb 19 Fr.  07/01/14   1520   Thigh   5          Medications:     Current Facility-Administered Medications   Medication Dose Route Frequency   . aspirin  325 mg Oral Daily   . cefepime  2 g Intravenous Q8H SCH   . enoxaparin  40 mg Subcutaneous Q24H   . gabapentin  300 mg Oral 6X Daily   . insulin glargine  38 Units Subcutaneous QHS   . metFORMIN  1,000 mg Oral BID Meals   . metroNIDAZOLE  500 mg Intravenous Q8H SCH   . oxyCODONE  10 mg Oral Q12H SCH   . simvastatin  40 mg Oral QHS     . bupivacaine 0.25% 2 mL/hr (07/01/14 1739)     dextrose, diazepam, diphenhydrAMINE, glucagon (rDNA), hydrALAZINE, HYDROmorphone, ibuprofen, insulin aspart, oxyCODONE     Physical Exam:   Current Vitals:   Filed Vitals:    07/07/14 0508   BP: 118/59   Pulse: 64   Temp: 98.4 F (36.9 C)   Resp: 18   SpO2: 95%     Vital signs in last 24 hours:Temp:  [97.7 F (36.5 C)-98.8 F (37.1 C)] 98.4 F (36.9 C)  Heart Rate:  [64-88] 64  Resp Rate:  [16-18] 18  BP: (118-158)/(59-86) 118/59 mmHg  Body mass index is 42.04 kg/(m^2).    Intake and Output Summary  (Last 24 hours):  I/O last 3 completed shifts:  In: 1550 [P.O.:1550]  Out: 23.5 [Urine:1; Drains:22.5]    PHYSICAL EXAM:  General:AAOx3, NAD  HEENT: scalp incision with staples in place, partially closed with integra/xeroform in place, improving L frontal and infraorbital swelling  CV: RRR, no murmurs, rubs, gallops  Pulm: CTAB, no wheezes, rhonci  Extremities: LLE incision c/d/i, no erythema noted  Neuro: Sensation intact throughout, CN 2-12 intact, gross motor intact      Labs:     Hematology   No results for input(s): WBC, HGB, HCT, PLT in the last 72 hours.   Chemistry   No results for input(s): NA, K, CL, CO2, BUN, CREAT, GLU, CA, MG, PHOS in the last 72 hours.    Invalid input(s): CR, CREATININE   Coagulation   No results for input(s): PT, INR, PTT in the last 72 hours.   Liver Function Tests   No results for input(s): AST, ALT, ALKPHOS, PROT, ALB, BILITOTAL, BILIDIRECT, LIP, AMY, PREALB in the  last 72 hours.     Cultures:  None     Rads:   Radiological Procedure reviewed.  No results found.      Assessment:   [name] Brown is a 48 y.o. male hx HTN, DM, brain abscess s/p craniotomy 09/2013 now w recurrent brain abscess POD 6 s/p procedure noted above    Plan:   Continue to elevate LLE as tolerated  Avoid any compression to the forehead, esp L side  Ambulate/pulm toilet  Continue asa 325 and lovenox 40qd  Continue abx per ID; currently vanc/cefepime/flagyl  Change xeroform in scalp incision daily   Ok to discharge from [facility] standpoint   - d/c instructions in chart    M. Everlena Cooper, MD  General Surgery PGY-2  Pager: [phone]

## 2014-07-07 NOTE — Progress Notes (Signed)
Pt is pod 6 with intracranial free flap from vastus lateralis complex closure with stable hemodynamics no neuro deficit c/o incisional pain medicating with po dilaudid and ibuprofen per PRN orders.Pt refusing to have his picc line dressing change MD and management is aware afebrile on IV AB per order cont to observe.

## 2014-07-07 NOTE — PT Progress Note (Addendum)
[facility]   Physical Therapy Cancellation Note      Patient:  [name] Brown MRN#:  [id number]  [facility] Room/Bed:  FI252/FI252-01    07/07/2014  Time: 09:50      Pt not seen for physical therapy secondary to patient refused stating had a rough week and does not feel like doing anything. Will continue to follow.    Gerline Legacy, PT pager [phone], 07/07/2014 9:57 AM         Addendum: Re-attempted PT eval at 14:45, patient declined again w/o specific reason.   Gerline Legacy, PT pager [phone], 07/07/2014 2:46 PM

## 2014-07-08 ENCOUNTER — Other Ambulatory Visit: Payer: Self-pay

## 2014-07-08 LAB — BASIC METABOLIC PANEL
BUN: 15 mg/dL (ref 9.0–28.0)
CO2: 22 mEq/L (ref 22–29)
Calcium: 8.8 mg/dL (ref 8.5–10.5)
Chloride: 106 mEq/L (ref 100–111)
Creatinine: 0.8 mg/dL (ref 0.7–1.3)
Glucose: 144 mg/dL — ABNORMAL HIGH (ref 70–100)
Potassium: 4.6 mEq/L (ref 3.5–5.1)
Sodium: 138 mEq/L (ref 136–145)

## 2014-07-08 LAB — CBC AND DIFFERENTIAL
Basophils Absolute Automated: 0.03 10*3/uL (ref 0.00–0.20)
Basophils Automated: 0 %
Eosinophils Absolute Automated: 0.24 10*3/uL (ref 0.00–0.70)
Eosinophils Automated: 2 %
Hematocrit: 39.4 % — ABNORMAL LOW (ref 42.0–52.0)
Hgb: 13 g/dL (ref 13.0–17.0)
Immature Granulocytes Absolute: 0.06 10*3/uL — ABNORMAL HIGH
Immature Granulocytes: 1 %
Lymphocytes Absolute Automated: 2.68 10*3/uL (ref 0.50–4.40)
Lymphocytes Automated: 21 %
MCH: 29.8 pg (ref 28.0–32.0)
MCHC: 33 g/dL (ref 32.0–36.0)
MCV: 90.4 fL (ref 80.0–100.0)
MPV: 9.4 fL (ref 9.4–12.3)
Monocytes Absolute Automated: 0.78 10*3/uL (ref 0.00–1.20)
Monocytes: 6 %
Neutrophils Absolute: 8.77 10*3/uL — ABNORMAL HIGH (ref 1.80–8.10)
Neutrophils: 70 %
Nucleated RBC: 0 /100 WBC (ref 0–1)
Platelets: 338 10*3/uL (ref 140–400)
RBC: 4.36 10*6/uL — ABNORMAL LOW (ref 4.70–6.00)
RDW: 14 % (ref 12–15)
WBC: 12.56 10*3/uL — ABNORMAL HIGH (ref 3.50–10.80)

## 2014-07-08 LAB — GLUCOSE WHOLE BLOOD - POCT: Whole Blood Glucose POCT: 117 mg/dL — ABNORMAL HIGH (ref 70–100)

## 2014-07-08 LAB — GFR: EGFR: >60

## 2014-07-08 MED ORDER — HYDROMORPHONE HCL 2 MG PO TABS
2.0000 mg | ORAL_TABLET | ORAL | 0 refills | Status: DC | PRN
Start: 2014-07-08 — End: 2018-02-11
  Filled 2014-07-08: qty 30, 5d supply, fill #0

## 2014-07-08 MED ORDER — ASPIRIN 325 MG PO TABS
325.0000 mg | ORAL_TABLET | Freq: Every day | ORAL | Status: AC
Start: 2014-07-08 — End: ?

## 2014-07-08 MED ORDER — METRONIDAZOLE 500 MG PO TABS
500.0000 mg | ORAL_TABLET | Freq: Three times a day (TID) | ORAL | 0 refills | Status: DC
Start: 2014-07-08 — End: 2018-02-11
  Filled 2014-07-08: qty 90, 30d supply, fill #0

## 2014-07-08 MED ORDER — CEFEPIME HCL 2 G IV SOLR
2.0000 g | Freq: Three times a day (TID) | INTRAVENOUS | Status: DC
Start: 2014-07-08 — End: 2018-07-23

## 2014-07-08 MED ORDER — OXYCODONE HCL 5 MG PO TABS
15.0000 mg | ORAL_TABLET | ORAL | 0 refills | Status: DC | PRN
Start: 2014-07-08 — End: 2014-07-28
  Filled 2014-07-08: qty 90, 4d supply, fill #0

## 2014-07-08 MED ORDER — OXYCODONE HCL ER 10 MG PO T12A
10.0000 mg | EXTENDED_RELEASE_TABLET | Freq: Two times a day (BID) | ORAL | 0 refills | Status: DC
Start: 2014-07-08 — End: 2018-02-11
  Filled 2014-07-08: qty 30, 15d supply, fill #0

## 2014-07-08 NOTE — OT Eval Note (Signed)
[facility]   Occupational Therapy Evaluation     Patient: [name] Brown    MRN#: [id number]   [facility]  Bed: FI252/FI252-01                                     Discharge Recommendations:   Discharge Recommendation: Home with family supervision/assist, home health OT (declining home services)  DME Recommended for Discharge: Shower chair        Assessment:   [name] Brown is a 48 y.o. male admitted 06/23/2014 with recurrent brain abscess, craniotomy, tumor removal, intracranial free tissue transfer reconstruction using vastus lateralis free flap. Presents with decrease adls, activity tolerance, and safety awareness. Will benefit from skilled OT intervention to increase independence in ADLs.    Therapy Diagnosis: decrease ADLs    Rehabilitation Potential: good    Treatment Activities: OT eval, patient education on safety with ADLs, energy conservation techniques, verbalizes learning  Educated the patient to role of occupational therapy, plan of care, goals of therapy and safety with mobility and ADLs, energy conservation techniques.    Plan:   OT Frequency Recommended: 1-2x/wk     Treatment/Interventions: ADL retraining, functional mobility training, patient education, therapeutic exercises, therapeutic activities      Risks/benefits/POC discussed yes       Precautions and Contraindications:   Act as tolerated    Consult received for [name] Brown for OT Evaluation and Treatment.  Patient's medical condition is appropriate for Occupational Therapy intervention at this time.      History of Present Illness:    [name] Brown is a 48 y.o. 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 Pt reports bilateral retroorbital headaches weekly at around 2-3am which wake him up. Pt reports intermittent photophobia but denies visual changes or N/V. Pt denies  F/C/D/SOB/CP/abd pain or any other sx's. Pt was admitted to NSICU on 09/30/13, d/c'ed to rehab on 10/07/14. Pt was d/c'ed home on 10/18/13.   Operative Procedure:    Procedure(s):  CRANIOTOMY, REMOVAL TUMOR  ENDOSCOPIC SINUS sphenoidotomy with navigation  RECONSTRUCTION, Free VL flap   Integra  Preoperative Diagnosis:    Pre-Op Diagnosis Codes:  * Intracranial abscess [324.0]      Admitting Diagnosis: Brain abscess [324.0]    Past Medical/Surgical History:            Past Medical History            Diagnosis        Date            .        Seasonal allergic rhinitis                    .        Diabetes mellitus                    .        Sinusitis                    .        Migraines                    .        HLD (hyperlipidemia)                    .  Brain abscess                    .        Sleep apnea        2000                            c-pap            .        Headache        1999                            chronic                    Available old records reviewed, including: EPIC                Past Surgical History:                         Past Surgical History                              Past Surgical History            Procedure        Laterality        Date            .        Craniotomy, removal hematoma, subdural                09/30/2013                            Procedure: CRANIOTOMY, REMOVAL HEMATOMA, SUBDURAL;  Surgeon: Madaline Savage, MD; Location: [place] [place] [facility];  Service: Neurosurgery;  Laterality: N/A;   BI-FRONTAL CRANIOTOMY, FOR  EVACUATION OF RIGHT FRONTAL ABSCESS          Imaging/Tests/Labs:  9/7 CT head            Past Medical History            Diagnosis        Date            .        Seasonal allergic rhinitis                    .        Diabetes mellitus                    .        Sinusitis                    .        Migraines                    .        HLD (hyperlipidemia)                    .        Brain abscess                    .        Sleep apnea  2000                             c-pap            .        Headache        1999                            chronic                    Available old records reviewed, including: EPIC                Past Surgical History:                         Past Surgical History                              Past Surgical History            Procedure        Laterality        Date            .        Craniotomy, removal hematoma, subdural                09/30/2013                            Procedure: CRANIOTOMY, REMOVAL HEMATOMA, SUBDURAL;  Surgeon: Madaline Savage, MD; Location: [place] [facility];  Service: Neurosurgery;  Laterality: N/A;   BI-FRONTAL CRANIOTOMY, FOR  EVACUATION OF RIGHT FRONTAL ABSCESS          Social History:   Prior Level of Function: indep with ADLs, works as Conservator, museum/gallery: none  Baseline Activity: community amb  DME Currently at Home: none  Home Living Arrangements: lives with wife  Type of Home: TH  Home Layout: multilevel    Subjective: I feel okay    Patient is agreeable to participation in the therapy session. Nursing clears patient for therapy.     Patient Goal: to go home  Pain:   Scale: unable to rate  Location: head and LLE  Intervention: RN aware    Objective:   Patient is in bed with restraints in place.      Cognitive Status and Neuro Exam:  Alert and oriented, able to follow commands      Musculoskeletal Examination  RUE ROM: WFL  LUE ROM: WFL  RLE ROM: WFL  LLE ROM: WFL    RUE Strength: WFL  LUE Strength: WFL  RLE Strength: WFL  LLE Strength: WFL      Sensory/Oculomotor Examination  Auditory: intact  Tactile: intact  Vision: intact      Activities of Daily Living  Eating: set up  Grooming: set up  Bathing: min A  UE Dressing: indep  LE Dressing: min A  Toileting: min A    Functional Mobility:  Supine to Sit: supervison  Sit to Stand: supervision  Transfers: CGA     Balance  Static Sitting: good  Dynamic Sitting: good  Static Standing: good  Dynamic Standing: good-    Participation and  Activity Tolerance  Participation Effort: good  Endurance: BP 142/66 O2 94% RA  HR 61    Patient left with call bell within reach, all needs met, SCDs request off and all questions answered. RN notified of session outcome and patient response.       Goals:  Time For Goal Achievement: 3 visits  ADL Goals  Patient will dress lower body: Modified Independent, without AE, 3 visits  Pt will complete bathing: Modified Independent, 3 visits  Patient will toilet: Supervision, without AE, 3 visits  Mobility and Transfer Goals  Pt will perform functional transfers: Supervision, 3 visits                         Minda Ditto, MS, OTR/L  Pager [phone]       Time of treatment:   OT Received On: 07/08/14  Start Time: 0830  Stop Time: 0900  Time Calculation (min): 30 min

## 2014-07-08 NOTE — Discharge Summary -  Nursing (Addendum)
Went over discharge paperwork with patient and wife including medication schedule/prescriptions, follow-up appointments, diet, activity, and incision care. Patient and wife verbalized understanding and had no further questions. Telemetry d/c'd per protocol. Given cane (by PT) per patient request upon dischargw. Scheduled antibiotics arranged by home health, single lumen PICC left in place. Safely transported to main [employer] by [employer].

## 2014-07-08 NOTE — Progress Notes (Signed)
Surgery Daily Progress Note  Surgery Team Plastic Surgery    Date/Time: 07/08/2014 7:04 AM  Bed:  FI252/FI252-01  Hospital Day: 16  7 Days Post-Op  Primary Procedure:   1. Intracranial free tissue transfer reconstruction, using vastus  lateralis free-flap.  2. Complex closure of the scalp, 22 cm.  3. Reconstruction of scalp using Integra dermal matrix, measuring roughly  an area of 6 x 12 cm.  4. Nasal endoscopy by Dr. Dorinda Hill.   5. Placement of On-Q pump, left thigh    Interval History:   No acute issues overnight. Continues with dressing changes L scalp. Afebrile.    Diet: Diet consistent carbohydrate  Juven Quantity:: A. One; Frequency:: BID with meals     Active Lines, Drains and Airways     Name:   Placement date:   Placement time:   Site:   Days:    PICC Single Lumen 06/26/14 Right Basilic  06/26/14   1517   Basilic   11    Closed/Suction Drain Left Thigh Bulb 19 Fr.  07/01/14   1520   Thigh   6          Medications:     Current Facility-Administered Medications   Medication Dose Route Frequency   . aspirin  325 mg Oral Daily   . cefepime  2 g Intravenous Q8H SCH   . enoxaparin  40 mg Subcutaneous Q24H   . gabapentin  300 mg Oral 6X Daily   . insulin glargine  38 Units Subcutaneous QHS   . metFORMIN  1,000 mg Oral BID Meals   . metroNIDAZOLE  500 mg Intravenous Q8H SCH   . oxyCODONE  10 mg Oral Q12H SCH   . simvastatin  40 mg Oral QHS     . bupivacaine 0.25% 2 mL/hr (07/01/14 1739)     dextrose, diazepam, diphenhydrAMINE, glucagon (rDNA), hydrALAZINE, HYDROmorphone, ibuprofen, insulin aspart, oxyCODONE     Physical Exam:   Current Vitals:   Filed Vitals:    07/08/14 0526   BP: 138/71   Pulse: 74   Temp: 97.7 F (36.5 C)   Resp: 18   SpO2: 96%     Vital signs in last 24 hours:Temp:  [97.7 F (36.5 C)-99 F (37.2 C)] 97.7 F (36.5 C)  Heart Rate:  [68-78] 74  Resp Rate:  [17-18] 18  BP: (114-140)/(61-79) 138/71 mmHg  Body mass index is 42.18 kg/(m^2).    Intake and Output Summary (Last 24 hours):  I/O last  3 completed shifts:  In: 1070 [P.O.:1070]  Out: 1 [Urine:1]    PHYSICAL EXAM:  General:AAOx3, NAD  HEENT: scalp incision with staples in place, partially closed with integra/xeroform in place, improving L frontal and infraorbital swelling  CV: RRR, no murmurs, rubs, gallops  Pulm: CTAB, no wheezes, rhonci  Extremities: LLE incision c/d/i, no erythema noted  Neuro: Sensation intact throughout, CN 2-12 intact, gross motor intact      Labs:     Hematology   No results for input(s): WBC, HGB, HCT, PLT in the last 72 hours.   Chemistry   No results for input(s): NA, K, CL, CO2, BUN, CREAT, GLU, CA, MG, PHOS in the last 72 hours.    Invalid input(s): CR, CREATININE   Coagulation   No results for input(s): PT, INR, PTT in the last 72 hours.   Liver Function Tests   No results for input(s): AST, ALT, ALKPHOS, PROT, ALB, BILITOTAL, BILIDIRECT, LIP, AMY, PREALB in the last 72  hours.     Cultures:  None     Rads:   Radiological Procedure reviewed.  No results found.      Assessment:   [name] Brown is a 48 y.o. male hx HTN, DM, brain abscess s/p craniotomy 09/2013 now w recurrent brain abscess POD 7 s/p procedure noted above    Plan:   Continue to elevate LLE as tolerated  Avoid any compression to the forehead   - Discussed importance of avoiding sleeping on pt's L side that may compress the flap  Ambulate/pulm toilet  Continue asa 325 and lovenox 40qd  Continue abx per ID; currently cefepime/flagyl  Change xeroform in scalp incision daily   Ok to discharge from [facility] standpoint   - follow up with Dr. Nicolette Bang 7/17    M. Everlena Cooper, MD  General Surgery PGY-2  Pager: [phone]

## 2014-07-08 NOTE — PT Eval Note (Signed)
[facility]   Physical Therapy Evaluation   Patient: [name] Brown    MRN#: [id number]   [facility]  Bed: FI252/FI252-01    Discharge Recommendations:   Discharge Recommendation: Home with home health PT, Home with supervision   DME Recommendation: DME Recommended for Discharge: Single point cane      Assessment:   [name] Brown is a 48 y.o. male admitted 06/23/2014.  Pt presents with mild decrease in functional mobility d/t pain in LLE and weakness.  Functional on level and stairs with rail and SPC.  Would benefit from HHPT to work on LLE ROM/ strengthening and progressive amb.     Impairments: Assessment: Gait impairment.     Therapy Diagnosis: decreased functional mobililty    Rehabilitation Potential: Prognosis: Good    Treatment Activities: BSE, gait with SPC, stairs training.   Educated the patient to role of physical therapy, plan of care, goals of therapy and safety with mobility and ADLs.    Plan:   Treatment/Interventions: LE strengthening/ROM     PT Frequency: 2-3x/wk   Risks/Benefits/POC Discussed with Pt/Family: With patient        Precautions and Contraindications:   Weight Bearing Status: no restrictions    Consult received for [name] Brown for PT Evaluation and Treatment.  Patient's medical condition is appropriate for Physical therapy intervention at this time.    Medical Diagnosis: Brain abscess [324.0]      History of Present Illness:   [name] Brown is a 48 y.o. 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  Pt reports bilateral retroorbital headaches weekly at around 2-3am which wake him up.  Pt reports intermittent photophobia but denies visual changes or N/V.  Pt denies F/C/D/SOB/CP/abd pain or any other sx's.  Pt was admitted to NSICU on 09/30/13, d/c'ed to rehab on 10/07/14.  Pt was d/c'ed home on 10/18/13.  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    TITLES OF PROCEDURES:  1.  Intracranial free tissue transfer reconstruction, using vastus  lateralis free-flap.  2.  Complex closure of the scalp, 22 cm.  3.  Reconstruction of scalp using Integra dermal matrix, measuring roughly  an area of 6 x 12 cm.  4.  Nasal endoscopy by Dr. Dorinda Hill.    5.  Placement of On-Q pump, left thigh.      Past Medical/Surgical History:  Past Medical History   Diagnosis   Date    Seasonal allergic rhinitis    Diabetes mellitus    Sinusitis    Migraines    HLD (hyperlipidemia)    Brain abscess    Sleep apnea c-pap .   Headache chronic   Past Surgical History   Procedure   Laterality   Date    Craniotomy, removal hematoma, subdural 09/30/2013    Procedure: CRANIOTOMY, REMOVAL HEMATOMA, SUBDURAL;  Surgeon: Madaline Savage, MD; Location: [place] [facility];  Service: Neurosurgery;  Laterality: N/A;   BI-FRONTAL CRANIOTOMY, FOR  EVACUATION OF RIGHT FRONTAL ABSCESS   X-Rays/Tests/Labs:  8/25 MRI Brain: IMPRESSION:    1. Contraction of the previously visualized right frontal lobe abscess  cavity as above.  2. Abnormal enhancement along the right cribriform plate that extends  into  the adjacent right frontal gyrus rectus and orbital gyrus with superimposed  abscess formation as above.  3. Enhancing soft tissue extending through the right lamina papyracea into  the medial right orbit consistent with subperiosteal abscess/phlegmon with  suspected involvement of the right medial rectus muscle as above.   9/7 CT HEAD: Clinical history: Status post surgery.   Comments: Unenhanced CT scan of the brain was performed.   There is postsurgical change with evidence of prior frontal craniotomy  and left frontal craniectomy. Radiodense material over the left frontal  lobe anteriorly measuring up to 2.1 cm in thickness is  likely a  combination of a flap and blood products. There is small amount of  postoperative pneumocephalus. There is encephalomalacic change and/or  edema in the right frontal lobe, similar to 06/20/2014 MRI. There is no  hydrocephalus. There is mild mass effect with mild posterior  displacement of the frontal Brown of the lateral ventricles.    Social History:   Prior Level of Function:  Prior level of function: Ambulates independently, Independent with ADLs  Baseline Activity Level: Community ambulation  DME Currently at Home: Front wheel walker    Home Living Arrangements:  Living Arrangements: Spouse/significant other  Type of Home: House  Home Layout: Multi-level (1 flight of stairs )  DME Currently at Home: Front wheel walker    Subjective:   Patient is agreeable to participation in the therapy session. Nursing clears patient for therapy.     Patient Goal:  (home today)    Pain Assessment  Pain Assessment: Numeric Scale (0-10)  Pain Score: 2-mild pain  Pain Location: Head  Pain Intervention(s):  (RN aware)    Objective:   Observation of Patient/Vital Signs:  Patient is seated at edge of bed with telemetry in place.    Observation of Patient/Vital signs:  Inspection/Posture:  Gaffer)    Cognition  Orientation Level: Oriented X4         Musculoskeletal Examination:  Gross ROM  Right Upper Extremity ROM: within functional limits  Left Upper Extremity ROM: within functional limits  Right Lower Extremity ROM: within functional limits  Left Lower Extremity ROM: within functional limits    Gross Strength  Right Upper Extremity Strength: within functional limits  Left Upper Extremity Strength: within functional limits  Right Lower Extremity Strength: within functional limits  Left Lower Extremity Strength:  (grossly 3+/5)         Functional Mobility:  Supine to Sit: Independent  Scooting to EOB: Independent  Sit to Supine: Independent  Sit to Stand: Independent  Stand to Sit: Independent    Transfers  Bed to  Chair: Modified Independent    Ambulation:  Ambulation: Modified Independent;with single point cane  Ambulation Distance (Feet): 150 Feet (150'x2)  Pattern: L foot flat;L foot decreased clearance;decreased step length;L decreased stance time  Stair Management: Supervision - Modified Independent;one rail L;step to pattern;with cane  Number of Stairs: 9     Balance:  Sitting - Static: Good  Sitting - Dynamic: Good  Standing - Static: Good  Standing - Dynamic: Fair    Participation and Activity Tolerance:  Participation Effort: good      Patient left with call bell within reach, all needs met, and all questions answered. RN notified of session outcome and patient response.       Goals:   Goals  Goal Formulation: With patient  Time for Goal Acheivement: 3 visits  Goals: Select goal  Pt Will Ambulate: > 200  feet, with single point cane, modified independent  Pt Will Go Up / Down Stairs: 1 flight, modified independent, With rail, With [facility]  Other Goal:  (Demonstrate 4/5 strength LLE for mobility)           Time of treatment:   PT Received On: 07/08/14  Start Time: 0930  Stop Time: 1010  Time Calculation (min): 40 min    Reine Just, PT

## 2014-07-08 NOTE — Progress Notes (Signed)
ID PROGRESS NOTE    Date Time: 07/08/2014 9:32 AM  Patient Name: [facility]      Problem List:    Acute  Worsening headaches  New changes in MRI suggesting abscess in R cribriform plate/ R frontal lobe and medial R orbit/medial rectus muscle  Prior MSSA brain abscess in 09/2013, treated with drainage and IV abx  -S/P combined neurosurgery and plastics intervention/ craniotomy 9/5 for repair of defect.   -- No purulence, but phlegmon noted and sent for pathology (cx negative). Path unremarkable.  Diarrhea chronic  -------------------------------------  Chronic conditions  Diabetes mellitus  Sleep apnea  Extensive history of recurrent sinusitis  -- multiple sinus surgeries, including at least two craniotomies  Antibiotic allergies: Penicillin listed but tolerating cefepime August/September 2015    Assessment:   On treatment for a brain phlegmon s/p craniotomy and flap repair on 9/5. Intra-op cultures are negative, but he had been on IV antibiotics prior to surgery. MRSA coverage d/c'ed on 9/8. Flap with good signals per plastic surgery.    Will treat for now with 4 weeks of antibiotics from the surgery on 9/5 but may need an additional duration depending on outpatient progress.    Antibiotics:   #16 Cefepime 2 gm IV q 8H  #16 Metronidazole 500mg  IV q 8H (will switch to PO, same dose/frequency on discharge)    Plan:   1.  Cefepime and metronidazole through at least July 28, 2014.  Will change metronidazole to PO.  He may need a longer duration of antibiotics than Oct. 2nd, the ultimate duration will depend on outpatient follow up.  2.  Follow up with Dr. Tyler Deis in 2 weeks [id number]    Discussed with the patient and his wife, including that antibiotics should not be stopped until he is seen by [place] in the office for follow up, do not stop antibiotics if side effects/call our office, GI side effects of PO metronidazole.    Lines:     Active PICC Line / CVC Line / PIV Line / Drain / Airway / Intraosseous Line /  Epidural Line / ART Line / Line / Wound / Pressure Ulcer / NG/OG Tube     Name:   Placement date:   Placement time:   Site:   Days:    PICC Single Lumen 06/26/14 Right Basilic  06/26/14   1517   Basilic   11    Closed/Suction Drain Left Thigh Bulb 19 Fr.  07/01/14   1520   Thigh   6    Incision Site 07/01/14 Leg Left  07/01/14   1522     6    Incision Site 07/01/14 Head Other (Comment)  07/01/14   1643     6          *I have performed a risk-benefit analysis and the patient needs a central line for access and IV medications    Family History:     Family History   Problem Relation Age of Onset   . Stroke Neg Hx        Social History:     History     Social History   . Marital Status: Married     Spouse Name: N/A     Number of Children: N/A   . Years of Education: N/A     Social History Main Topics   . Smoking status: Light Tobacco Smoker     Types: Cigars   . Smokeless tobacco: Never Used  Comment: Occasional Cigars   . Alcohol Use: 0.0 oz/week     4-5 Cans of beer per week   . Drug Use: No   . Sexual Activity: Not on file     Other Topics Concern   . Not on file     Social History Narrative       Allergies:     Allergies   Allergen Reactions   . Morphine      Pt stated the medication "burned" when given through an IV   . Penicillins       "I have never taken it, but I am allergic to it":  Tolerating cefepime August/September 2015       Review of Systems:   Afebrile  Walking the halls with PT/OT.  No diarrhea  Discharge planned for later today.    Physical Exam:     Filed Vitals:    07/08/14 0726   BP: 139/80   Pulse:    Temp: 98.2 F (36.8 C)   Resp: 18   SpO2: 93%       General Appearance: alert and appropriate, non-toxic  Neuro: alert, oriented, normal speech, normal attention and cognition  HEENT: wound closed on the right, open on the left, packing in place, some facial swelling, appears similar to my last exam, not worse  Lungs: clear to auscultation, no wheezes, rales or rhonchi   Cardiac: normal rate,  regular rhythm   Abdomen: soft, non-tender   Extremities:  mild pedal edma    Labs:     Lab Results   Component Value Date    WBC 9.14 07/04/2014    HGB 11.3* 07/04/2014    HCT 35.1* 07/04/2014    MCV 90.9 07/04/2014    PLT 230 07/04/2014     Lab Results   Component Value Date    CREAT 0.7 07/04/2014     Lab Results   Component Value Date    ALT 99* 07/01/2014    AST 73* 07/01/2014    ALKPHOS 51 07/01/2014    BILITOTAL 0.5 07/01/2014     No results found for: LACTATE    Microbiology:     Microbiology Results     Procedure Component Value Units Date/Time    AFB culture and smear [[id number]] Collected:  07/01/14 1303    Specimen Information:  Sputum / Tissue Updated:  07/02/14 0854    Narrative:      ORDER#: [id number]                                    ORDERED BY: LIM, JAE  SOURCE: Tissue skull (right ethimoid bone)           COLLECTED:  07/01/14 13:03  ANTIBIOTICS AT COLL.:                                RECEIVED :  07/01/14 19:27  Stain, Acid Fast                           FINAL       07/02/14 08:54  07/02/14   No Acid Fast Bacillus Seen  Culture Acid Fast Bacillus (AFB)           PENDING      AFB culture and smear [[id number]] Collected:  07/01/14 1303  Specimen Information:  Sputum / Tissue Updated:  07/02/14 0854    Narrative:      ORDER#: [id number]                                    ORDERED BY: LIM, JAE  SOURCE: Tissue right Ethimoid                        COLLECTED:  07/01/14 13:03  ANTIBIOTICS AT COLL.:                                RECEIVED :  07/01/14 19:23  Stain, Acid Fast                           FINAL       07/02/14 08:54  07/02/14   No Acid Fast Bacillus Seen  Culture Acid Fast Bacillus (AFB)           PENDING      Anaerobic culture [[id number]] Collected:  07/01/14 1303    Specimen Information:  Other / Tissue Updated:  07/05/14 0803    Narrative:      ORDER#: [id number]                                    ORDERED BY: LIM, JAE  SOURCE: Tissue right ethomoid                        COLLECTED:  07/01/14  13:03  ANTIBIOTICS AT COLL.:                                RECEIVED :  07/01/14 19:23  Culture, Anaerobic Bacteria                FINAL       07/05/14 08:03  07/05/14   No anaerobic growth      Anaerobic culture [[id number]] Collected:  07/01/14 1303    Specimen Information:  Other / Bone Updated:  07/05/14 0803    Narrative:      ORDER#: [id number]                                    ORDERED BY: LIM, JAE  SOURCE: Bone Right ethimoid bone                     COLLECTED:  07/01/14 13:03  ANTIBIOTICS AT COLL.:                                RECEIVED :  07/01/14 19:27  Culture, Anaerobic Bacteria                FINAL       07/05/14 08:03  07/05/14   No anaerobic growth      CULTURE BLOOD AEROBIC AND ANAEROBIC [[id number]] Collected:  06/23/14 2124    Specimen Information:  Blood, Venipuncture Updated:  06/29/14 0121    Narrative:      ORDER#: [id number]  ORDERED BY: HUNTER, NORIKO  SOURCE: Blood, Venipuncture rue                      COLLECTED:  06/23/14 21:24  ANTIBIOTICS AT COLL.:                                RECEIVED :  06/23/14 23:00  Culture Blood Aerobic and Anaerobic        FINAL       06/29/14 01:21  06/29/14   No growth after 5 days of incubation.      CULTURE BLOOD AEROBIC AND ANAEROBIC [[id number]] Collected:  06/23/14 2055    Specimen Information:  Blood, Venipuncture Updated:  06/29/14 0022    Narrative:      ORDER#: [id number]                                    ORDERED BY: Candy Sledge  SOURCE: Blood, Venipuncture lue                      COLLECTED:  06/23/14 20:55  ANTIBIOTICS AT COLL.:                                RECEIVED :  06/23/14 22:09  Culture Blood Aerobic and Anaerobic        FINAL       06/29/14 00:21  06/29/14   No growth after 5 days of incubation.      Fungus culture [[id number]] Collected:  07/01/14 1303    Specimen Information:  Other / Bone Updated:  07/02/14 0845    Narrative:      ORDER#: [id number]                                    ORDERED BY: LIM,  JAE  SOURCE: Bone right ethomoid bone                     COLLECTED:  07/01/14 13:03  ANTIBIOTICS AT COLL.:                                RECEIVED :  07/01/14 19:27  Stain, Fungal                              FINAL       07/02/14 08:45  07/02/14   No Fungal or Yeast Elements Seen  Culture Fungus                             PENDING      Fungus culture [[id number]] Collected:  07/01/14 1303    Specimen Information:  Other / Tissue Updated:  07/02/14 0845    Narrative:      ORDER#: [id number]                                    ORDERED BY: LIM, JAE  SOURCE: Tissue right ethimoid  COLLECTED:  07/01/14 13:03  ANTIBIOTICS AT COLL.:                                RECEIVED :  07/01/14 19:23  Stain, Fungal                              FINAL       07/02/14 08:45  07/02/14   No Fungal or Yeast Elements Seen  Culture Fungus                             PENDING      MRSA culture [[id number]] Collected:  07/01/14 2212    Specimen Information:  Body Fluid / Nasal/Throat ASC Admission Updated:  07/02/14 2340    Narrative:      ORDER#: [id number]                                    ORDERED BY: Daphane Shepherd, MEREDITH  SOURCE: Nares and Throat                             COLLECTED:  07/01/14 22:12  ANTIBIOTICS AT COLL.:                                RECEIVED :  07/02/14 02:32  Culture MRSA Surveillance                  FINAL       07/02/14 23:40  07/02/14   Negative for Methicillin Resistant Staph aureus from Nares and             Negative for Methicillin Resistant Staph aureus from Throat      Wound culture & gram stain [[id number]] Collected:  07/01/14 1303    Specimen Information:  Wound / Abscess Updated:  07/04/14 1712    Narrative:      ORDER#: [id number]                                    ORDERED BY: LIM, JAE  SOURCE: Abscess right ethomoid bone                  COLLECTED:  07/01/14 13:03  ANTIBIOTICS AT COLL.:                                RECEIVED :  07/01/14 19:27  Stain, Gram                                FINAL        07/01/14 20:35  07/01/14   No WBCs or organisms seen  Culture and Gram Stain, Aerobic, Wound     FINAL       07/04/14 17:12  07/04/14   No Growth      Wound culture & gram stain [[id number]] Collected:  07/01/14 1303    Specimen Information:  Wound / Abscess Updated:  07/04/14 1711  Narrative:      ORDER#: [id number]                                    ORDERED BY: LIM, JAE  SOURCE: Abscess right ethimoid                       COLLECTED:  07/01/14 13:03  ANTIBIOTICS AT COLL.:                                RECEIVED :  07/01/14 19:23  Stain, Gram                                FINAL       07/01/14 22:39  07/01/14   No WBCs or organisms seen  Culture and Gram Stain, Aerobic, Wound     FINAL       07/04/14 17:11  07/04/14   No Growth            Rads:   No results found.    Signed by: Marga Hoots, MD

## 2014-07-08 NOTE — Discharge Summary (Signed)
[facility] Discharge Summary      Patient: [name] Brown  Admission Date: 06/23/2014   DOB: [birth year]  Discharge Date: 07/08/2014    MRN: [id number]  Discharge Attending: Kari Baars MD   Referring Physician: Tressa Busman, MD  PCP: Tressa Busman, MD       DISCHARGE SUMMARY     Discharge Information   Admission Diagnosis:   Brain abscess     Discharge Diagnosis:   Patient Active Problem List    Diagnosis Date Noted   . Hypophosphatemia 07/03/2014   . Type 2 diabetes mellitus 06/23/2014   . Chronic headache 06/23/2014   . OSA (obstructive sleep apnea) 06/23/2014   . HLD (hyperlipidemia) 06/23/2014   . Sinusitis 06/23/2014   . HTN (hypertension) 06/23/2014   . Brain abscess 09/30/2013        Discharge Medications:     Medication List      START taking these medications          aspirin 325 MG tablet   Take 1 tablet (325 mg total) by mouth daily.       ceFEPime 2 G injection   Commonly known as:  MAXIPIME   Inject 2 g into the muscle every 8 (eight) hours.       HYDROmorphone 2 MG tablet   Commonly known as:  DILAUDID   Take 1 tablet (2 mg total) by mouth every 4 (four) hours as needed.       metroNIDAZOLE 500 MG tablet   Commonly known as:  FLAGYL   Take 1 tablet (500 mg total) by mouth 3 (three) times daily.         CHANGE how you take these medications          gabapentin 300 MG capsule   Commonly known as:  NEURONTIN   Take 1 capsule (300 mg total) by mouth every 8 (eight) hours.   What changed:    - when to take this  - additional instructions       * oxyCODONE 10 MG 12 hr tablet   Commonly known as:  OxyCONTIN   Take 1 tablet (10 mg total) by mouth every 12 (twelve) hours.   What changed:  when to take this       * oxyCODONE 15 MG immediate release tablet   Commonly known as:  ROXICODONE   Take 1 tablet (15 mg total) by mouth every 3 (three) hours as needed.   What changed:  Another medication with the same name was changed. Make sure you understand how and when to take  each.       * Notice:  This list has 2 medication(s) that are the same as other medications prescribed for you. Read the directions carefully, and ask your doctor or other care provider to review them with you.      CONTINUE taking these medications          insulin glargine 100 UNIT/ML injection   Commonly known as:  LANTUS   Inject 38 Units into the skin every morning.       metFORMIN 1000 MG tablet   Commonly known as:  GLUCOPHAGE   Take 1 tablet (1,000 mg total) by mouth 2 (two) times daily with meals.       simvastatin 40 MG tablet   Commonly known as:  ZOCOR   Take 1 tablet (40 mg total) by mouth daily with dinner.  STOP taking these medications          cefTRIAXone 2 G injection   Commonly known as:  ROCEPHIN       famotidine 20 MG tablet   Commonly known as:  PEPCID       insulin aspart 100 UNIT/ML injection   Commonly known as:  NovoLOG       saline 0.65 % Soln   Commonly known as:  OCEAN NASAL SPRAY       senna-docusate 8.6-50 MG per tablet   Commonly known as:  PERICOLACE         Where to Get Your Medications     These are the prescriptions that you need to pick up.         You may get the following medications from any pharmacy   -  aspirin 325 MG tablet   -  HYDROmorphone 2 MG tablet   -  metroNIDAZOLE 500 MG tablet   -  oxyCODONE 10 MG 12 hr tablet   -  oxyCODONE 15 MG immediate release tablet                Information on where to get these meds is not yet available. Ask your nurse or doctor.         -  ceFEPime 2 G injection                          Hospital Course   Presentation History   Sinai Mahany is a 48 y.o. 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 Pt reports bilateral retroorbital headaches weekly at around 2-3am which wake him up. Pt reports intermittent photophobia but denies visual changes or N/V. Pt was admitted to NSICU on 09/30/13, d/c'ed to rehab  on 10/07/13. Pt was discharged home on 10/18/13.    See HPI for details.    Hospital Course   Following admission, patient had MRI of the brain done before admission which showed abnormal enhancement along the superior margin of the right cribriform plate involving the inferior aspect of the adjacent right frontal gyrus rectus and orbital gyrus, extending 4.4 cm AP dimension by up to 1.5 cm wide with superimposed abscess formation, patient as outpatient has been on IV abx which was continued here, Neurosurgery, ENT and Plastic surgery team took him to OR on 07/01/14 and did complicated surgery which involve Bilateral endoscopic revision sphenoidotomy by ENT, Craniotomy, Washout and drainage of the anterior skull base with a vastus lateralis free flap from the left leg and complex closure measuring 22cm and Integra placement of the scalp wound, and Intracranial free tissue transfer reconstruction, using vastus lateralis free-flap, Complex closure of the scalp, 22 cm, Reconstruction of scalp using Integra dermal matrix, measuring roughly an area of 6 x 12 cm by plastic surgery and Bifrontal craniotomy for evacuation of intracerebral abscess and exposure of the anterior fossa for repair of defect.     The cultures from abscess drained, sinuses and blood were negative for any bacterial growth, he had Q pump on his left thigh which was taken off after few days, also monitored the tissue viability with internal doppler in the scalp , also had IC drain, which were all taken out on 9/10, pain has been controlled with pain meds for which he will be discharged with, at this time  he is cleared from all disciplines to go home and follow as outpatient, he will be on IV and PO antibiotics for few weeks down the road which was set up, has right arm PICC line placed, follow up with NSG and plastic surgery scheduled.     Home health is set up, medications updated, patient is ambulatory and is getting discharged home in stable  condition today.      Procedures:   CT Head WO Contrast   Final Result   Clinical history: Status post surgery.      Comments: Unenhanced CT scan of the brain was performed.      There is postsurgical change with evidence of prior frontal craniotomy   and left frontal craniectomy. Radiodense material over the left frontal   lobe anteriorly measuring up to 2.1 cm in thickness is likely a   combination of a flap and blood products. There is small amount of   postoperative pneumocephalus. There is encephalomalacic change and/or   edema in the right frontal lobe, similar to 06/20/2014 MRI. There is no   hydrocephalus. There is mild mass effect with mild posterior   displacement of the frontal horns of the lateral ventricles.      IMPRESSION:     Postsurgical changes as above.      Melody Haver, MD    07/03/2014 11:01 AM         CT Maxillofacial Bones   Final Result   Clinical history: Inferior right frontal lobe abscess. Evaluate for bony   dehiscence. Paranasal sinus disease.      Comments: Unenhanced CT of the paranasal sinuses was performed with   landmarks protocol.      There is evidence of prior right frontal craniotomy and anterior   craniotomy in the midline. 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 The middle and   superior turbinates are absent. A small portion of the left inferior   turbinate remains. The right inferior turbinate remains. The left   ethmoid air cells are obliterated and completely ossified. The left   maxillary sinus is obliterated and completely ossified. The right   maxillary sinus is small with chronic bony thickening and chronic   membrane thickening. This sphenoid sinuses are completely opacified with   membrane thickening and are small in size due to adjacent bony   thickening. The anterior right ethmoid air cells are absent with soft   tissue thickening in this area. The frontal sinuses have been previously   obliterated  with small residual.      The medial wall of the right orbit disc broadly dehiscent. The area of   dehiscence measures 21 mm craniocaudal and 28 mm anteroposterior. There   is nonspecific soft tissue thickening in this area of dehiscence. Soft   tissue thickening extends up to the adjacent medial rectus muscle. There   is also extensive bony dehiscence of the right cribriform plate. This   area of dehiscence measures 4 mm transverse and 15 mm anteroposterior. A   small intact bony septum extends through a portion of this area of   dehiscence. There is a smaller area of bony dehiscence involving the   superomedial wall of the right orbit posteriorly measuring 2 mm x 2.6   mm.      IMPRESSION:     Prior sinonasal surgery. Sinonasal disease. Extensive bony   dehiscence medial wall right orbit. Extensive bony dehiscence  right   cribriform plate. Small area of bony dehiscence superomedial aspect of   right orbit.      Melody Haver, MD    06/27/2014 5:10 PM         PICC Line Placement   Final Result   PROCEDURE:  PICC line insertion.      INDICATIONS:  48 year old male requires PICC catheter for intravenous   medications. An attempt at placement by the PICC team was unsuccessful..      CONSENT:  The procedure, risks, benefits and alternatives were discussed   with the patient.  All questions were answered, and informed written   consent was obtained from patient.      TECHNIQUE:  Ultrasound was used to confirm patency of the right basilic   vein prior to obtaining venous access.   The area to be punctured was   cleansed with chlorhexidine and the site draped using maximal sterile   barrier precautions.  A large sterile drape/broad field sheet was   applied. Standard hand washing was performed by the operators.  Sterile   gowns and gloves were worn as were caps and masks.  After 1% lidocaine   anesthesia, puncture of the right basilic vein was performed with a   21-gauge single-wall needle under direct sonographic  guidance.   Sonographic images were recorded pre- and post-puncture for   documentation. A .018-inch guidewire was advanced to the cavoatrial   junction.  The needle was removed and a peel-away sheath placed.    Fluoroscopic guidance was used to estimate appropriate catheter length.    The catheter was then trimmed and advanced through the peel-away sheath.    The sheath was removed.  The catheter was flushed and capped.  The   catheter was then secured to the skin and a sterile dressing applied.    The catheter tip was SVC/artery junction. Fluoroscopic image was   obtained to document catheter tip position.      IMPRESSION:      Successful placement of single lumen 4 -French PICC line   without complication.       Deborra Medina, MD    06/26/2014 3:28 PM         XR Chest AP Portable   Final Result   HISTORY: PICC placement.      COMPARISON: 10/01/2013.      FINDINGS: Left upper extremity PICC is coiled. The tip extends   retrograde in the left upper extremity, only partially visualized.   Overlying leads limit assessment somewhat. Low lung volumes,   cardiomediastinal silhouette and remainder of the chest are stable post   extubation and enteric tube removal.      IMPRESSION:     Aberrant/coiled left upper extremity PICC.      These findings were discussed with the patient's nurse, Sapana, at 9:30   PM. The PICC has reportedly been removed in the interval.      Prince Solian, MD    06/23/2014 9:35 PM             Consultants  Treatment Team: Attending Provider: Tery Sanfilippo, MD; Consulting Physician: Madaline Savage, MD; Consulting Physician: Higinio Roger, MD; Consulting Physician: Earle Gell, MD; Surgeon: Dorinda Hill, MD; Surgeon: Madaline Savage, MD; Surgeon: Higinio Roger, MD; Case Manager: Heywood Bene, MSW; Registered Nurse: Modesta Messing, RN; Registered Nurse: Milus Banister, RN; Physical Therapist: Janese Banks, PT; Respiratory Care Practitioner:  Dhanormchitphong, Reatha Armour, RCP  Best Practices   Was the patient admitted with either a CHF Exacerbation or Pneumonia? No     Progress Note/Physical Exam at Discharge     Subjective: some pain in the head but intermittent.     Filed Vitals:    07/07/14 2334 07/08/14 0526 07/08/14 0642 07/08/14 0726   BP: 139/79 138/71  139/80   Pulse: 78 74     Temp: 99 F (37.2 C) 97.7 F (36.5 C)  98.2 F (36.8 C)   TempSrc: Oral   Oral   Resp: 18 18  18    Height:       Weight:   133.358 kg (294 lb)    SpO2: 93% 96%  93%     General: No apparent distress, awake, alert, comfortable appearing, vital signs noted. Incision over scalp- clean and dry, left side scalp has some swelling.   Pupils- reactive, equal, no pallor, no icterus   Neck- supple, no mass, no bruits/JVP   Resp: Clear to ausculation, no wheezing, rhonci, or rales noted; normal respiratory effort   CV: Regular rate and rhythm, no murmurs, gallops, or rubs; no peripheral edema   GI: Abdomen obese, soft, non-tender, no masses; no hepatosplenomegaly   Skin: Normal temperature, tone, turgor; no rash, lesions, ulcers   Musc: No digital cyanosis or ischemia   Neuro: AOx3, CN 2-12 grossly intact, power 5/5 allover, sensation intact, DTR +1      Admission Condition: serious  Discharge Condition: good  Functional Status: Patient is independent with mobility/ambulation, transfers, ADL's, IADL's.     Diagnostics     Labs/Studies Pending at Discharge: No    Last Labs     Recent Labs  Lab 07/08/14  0911 07/04/14  0443 07/03/14  1341   WBC 12.56* 9.14 12.16*   RBC 4.36* 3.86* 3.97*   HEMOGLOBIN 13.0 11.3* 11.9*   HEMATOCRIT 39.4* 35.1* 36.6*   MCV 90.4 90.9 92.2   PLATELETS 338 230 244         Recent Labs  Lab 07/08/14  0911 07/04/14  0443 07/03/14  1341 07/01/14  1545   SODIUM 138 139 140 135*   POTASSIUM 4.6 4.0 4.0 4.4   CHLORIDE 106 108 107 106   CO2 22 25 25  18*   BUN 15.0 10.0 11.0 13.0   CREATININE 0.8 0.7 0.8 0.9   GLUCOSE 144* 141* 154* 204*   CALCIUM 8.8 7.9* 8.2*  7.8*   MAGNESIUM  --  1.9 2.1  --         Patient Instructions   Discharge Diet: cardiac diet and diabetic diet  Discharge Activity:  activity as tolerated  Discharge instructions:   Discharge Code Status: Full code   Additional instructions - Please f/u with Dr. Duanne Moron office in ~3 weeks      Disposition:  Home.   Follow Up Appointment:  See Tressa Busman, MD in the next few weeks.  See Dr. Jaynie Collins , neurosurgery, in 3 weeks.       Time spent examining patient, discussing with patient/family regarding hospital course, chart review, reconciling medications and discharge planning: total time : 1 hour.    Carmelina Noun, MD  10:35 AM 07/08/2014     CC to: Tressa Busman, MD

## 2014-07-08 NOTE — Discharge Instructions (Addendum)
Post-operative Instructions       IN THE EVENT OF ANY FEVER, SEVERE PAIN NOT RELIEVED BY YOUR PAIN MEDICATION, SWELLING, COMPLICATIONS, OR OTHER CONCERNS OR QUESTIONS, PLEASE CONTACT THE OFFICE IMMEDIATELY ([phone]).  THE OFFICE IS OPEN FOR CALLS BETWEEN 9:00 AM AND 5:00 PM.  AFTER HOURS, THE SERVICE WILL CONTACT THE DOCTOR.      DIET  You may return to your normal diet. Avoid constipation by drinking plenty of water.  Drink plenty of fluids. Vitamin supplementation with Vitamin C and a Multivitamin tablet may promote healing after surgery.  Constipation frequently occurs as a result of a reduction in your activities.  Stool softeners such as Colace (available over-the counter at your pharmacy) and bulk former such as Metamucil may be started prior to surgery to normalize your bowel habits.  If you need it, Milk of Magnesia is a gentle laxative.     SLEEP/REST    When you first get home, it is important to rest a lot. Do not sleep on the L side of your face/head - this is very important. Sleep on back with your head elevated and 1 to 2 pillows under your left leg to help with any swelling.    GARMENTS AND DRESSINGS    Leave the steri strips (tapes across the incisions) in place until they start to loosen, then remove them.  If the steri stripes cause irritation or a blister, remove immediately.       SMOKING    If you are a smoker you should stop smoking to encourage healing.  All patients should avoid smokers.    ACTIVITY    Do not stretch, bend or lift heavy items. Avoid strenuous activity - walking normally is ok. Avoid wearing any clothing that will compress your head    BREATHING EXERCISES    One of the most common causes of post surgical fever is associated with inadequate volume of breaths taken and therefore not expanding the lungs.  This has been shown to result in fevers and eventually pneumonia.  To avoid this complication please take ten deep breaths an hour while awake (use the incentive  inspirometry instrument form the hospital if one was provided). Perform one or two deep coughs an hour.      TRAVEL    We ask that you remain within reasonable traveling distance of the office for seven days.    Please keep the office informed of your postoperative telephone number and address.    Constipation    You may or may not get constipated from narcotics or inactivity.  Try to drink plenty of fluids and walk around as much as possible.  In the event that you do feel constipated, we recommend trying Milk of Magnesia (MOM) first.  Follow the directions on the bottle.  If that does not provide you with relief, then use a Ducolax Suppository.       Home Health Discharge Information     Your doctor has ordered Skilled Nursing and OtherIV antibotic,iPhysical Therapy and Occupational Therapy  in-home service(s) for you while you recuperate at home, to assist you in the transition from hospital to home.      The agency that you or your representative chose to provide the service:  [facility] [phone]      The Infusion Company:  Name of Infusion Company : [facility] [phone]]  Infusion: [facility]  Start on: Date:  07/08/14       The above services were set  up by:  Vickie L. Romeo Apple  [facility] Liaison)   Phone     [phone]                                          Avoiding sleeping on pt's L side that may compress the flap  Change xeroform in scalp incision daily

## 2014-07-25 ENCOUNTER — Ambulatory Visit: Payer: Self-pay

## 2014-07-25 NOTE — Pre-Procedure Instructions (Addendum)
Found 06/2014 abnormal labs in Epic.  Only rhythm strips in Epic from 06/2014.  Patient stated he is unable to have EKG done prior to 07/28/14.  For anesthesia review for suitability in WB due to abnormal lab, outdated EKG and multiple co-morbidities.

## 2014-07-26 NOTE — Pre-Procedure Instructions (Addendum)
Health hx. Sent for anesthesia review/ok to proceed

## 2014-07-26 NOTE — Anesthesia Preprocedure Evaluation (Addendum)
Anesthesia Evaluation    AIRWAY    Mallampati: III    TM distance: >3 FB  Neck ROM: full  Mouth Opening:full   CARDIOVASCULAR    cardiovascular exam normal, regular and normal       DENTAL    no notable dental hx     PULMONARY    pulmonary exam normal and clear to auscultation     OTHER FINDINGS              PSS Anesthesia Comments: Low risk peripheral procedure.OK to proceed. IA        Anesthesia Plan    ASA 3     general                     intravenous induction   Detailed anesthesia plan: general LMA  Monitors/Adjuncts: other    Post Op: other  Post op pain management: per surgeon    informed consent obtained    Plan discussed with CRNA.

## 2014-07-27 MED ORDER — BUPIVACAINE-EPINEPHRINE (PF) 0.5% -1:200000 IJ SOLN
INTRAMUSCULAR | Status: AC
Start: 2014-07-27 — End: ?
  Filled 2014-07-27: qty 30

## 2014-07-27 MED ORDER — LIDOCAINE-EPINEPHRINE 1 %-1:100000 IJ SOLN
INTRAMUSCULAR | Status: AC
Start: 2014-07-27 — End: ?
  Filled 2014-07-27: qty 40

## 2014-07-28 ENCOUNTER — Ambulatory Visit: Payer: BLUE CROSS/BLUE SHIELD | Admitting: Anesthesiology

## 2014-07-28 ENCOUNTER — Ambulatory Visit: Payer: Self-pay | Admitting: Plastic and Reconstructive Surgery

## 2014-07-28 ENCOUNTER — Ambulatory Visit
Admission: RE | Admit: 2014-07-28 | Discharge: 2014-07-28 | Disposition: A | Discharge status: Home / Self Care | Payer: BLUE CROSS/BLUE SHIELD | Source: Ambulatory Visit | Attending: Plastic and Reconstructive Surgery | Admitting: Plastic and Reconstructive Surgery

## 2014-07-28 ENCOUNTER — Encounter
Admission: RE | Disposition: A | Discharge status: Home / Self Care | Payer: Self-pay | Source: Ambulatory Visit | Attending: Plastic and Reconstructive Surgery

## 2014-07-28 DIAGNOSIS — E785 Hyperlipidemia, unspecified: Secondary | ICD-10-CM | POA: Insufficient documentation

## 2014-07-28 DIAGNOSIS — E119 Type 2 diabetes mellitus without complications: Secondary | ICD-10-CM | POA: Insufficient documentation

## 2014-07-28 DIAGNOSIS — G4730 Sleep apnea, unspecified: Secondary | ICD-10-CM | POA: Insufficient documentation

## 2014-07-28 DIAGNOSIS — Z794 Long term (current) use of insulin: Secondary | ICD-10-CM | POA: Insufficient documentation

## 2014-07-28 DIAGNOSIS — Z481 Encounter for planned postprocedural wound closure: Secondary | ICD-10-CM | POA: Insufficient documentation

## 2014-07-28 DIAGNOSIS — F17210 Nicotine dependence, cigarettes, uncomplicated: Secondary | ICD-10-CM | POA: Insufficient documentation

## 2014-07-28 DIAGNOSIS — Z8709 Personal history of other diseases of the respiratory system: Secondary | ICD-10-CM | POA: Insufficient documentation

## 2014-07-28 DIAGNOSIS — G8918 Other acute postprocedural pain: Secondary | ICD-10-CM | POA: Diagnosis present

## 2014-07-28 HISTORY — DX: Other chronic pain: G89.29

## 2014-07-28 HISTORY — DX: Encounter for adjustment and management of vascular access device: Z45.2

## 2014-07-28 HISTORY — PX: DEBRIDEMENT & IRRIGATION, WOUND CLOSURE: SHX3698

## 2014-07-28 HISTORY — DX: Chronic sinusitis, unspecified: J32.9

## 2014-07-28 LAB — GLUCOSE WHOLE BLOOD - POCT: Whole Blood Glucose POCT: 97 mg/dL (ref 70–100)

## 2014-07-28 SURGERY — DEBRIDEMENT & IRRIGATION, WOUND CLOSURE
Anesthesia: Anesthesia General | Wound class: Clean

## 2014-07-28 MED ORDER — CLINDAMYCIN PHOSPHATE IN D5W 900 MG/50ML IV SOLN
900.0000 mg | Freq: Once | INTRAVENOUS | Status: AC
Start: 2014-07-28 — End: 2014-07-28
  Administered 2014-07-28: 900 mg via INTRAVENOUS

## 2014-07-28 MED ORDER — FENTANYL CITRATE 0.05 MG/ML IJ SOLN
25.0000 ug | INTRAMUSCULAR | Status: DC | PRN
Start: 2014-07-28 — End: 2014-07-28

## 2014-07-28 MED ORDER — HYDROCODONE-ACETAMINOPHEN 5-325 MG PO TABS
1.0000 | ORAL_TABLET | Freq: Once | ORAL | Status: AC | PRN
Start: 2014-07-28 — End: 2014-07-28
  Administered 2014-07-28: 1 via ORAL

## 2014-07-28 MED ORDER — FENTANYL CITRATE 0.05 MG/ML IJ SOLN
INTRAMUSCULAR | Status: DC | PRN
Start: 2014-07-28 — End: 2014-07-28
  Administered 2014-07-28: 100 ug via INTRAVENOUS

## 2014-07-28 MED ORDER — PROMETHAZINE HCL 25 MG/ML IJ SOLN
6.2500 mg | Freq: Once | INTRAMUSCULAR | Status: DC | PRN
Start: 2014-07-28 — End: 2014-07-28

## 2014-07-28 MED ORDER — PROPOFOL INFUSION 10 MG/ML
INTRAVENOUS | Status: DC | PRN
Start: 2014-07-28 — End: 2014-07-28
  Administered 2014-07-28: 200 mg via INTRAVENOUS

## 2014-07-28 MED ORDER — MUPIROCIN 2 % EX OINT
TOPICAL_OINTMENT | CUTANEOUS | Status: AC
Start: 2014-07-28 — End: ?
  Filled 2014-07-28: qty 22

## 2014-07-28 MED ORDER — PHENYLEPHRINE HCL 10 MG/ML IJ SOLN
INTRAMUSCULAR | Status: DC | PRN
Start: 2014-07-28 — End: 2014-07-28
  Administered 2014-07-28 (×6): 100 ug via INTRAVENOUS
  Administered 2014-07-28: 200 ug via INTRAVENOUS

## 2014-07-28 MED ORDER — FENTANYL CITRATE 0.05 MG/ML IJ SOLN
INTRAMUSCULAR | Status: AC
Start: 2014-07-28 — End: ?
  Filled 2014-07-28: qty 2

## 2014-07-28 MED ORDER — MIDAZOLAM HCL 2 MG/2ML IJ SOLN
INTRAMUSCULAR | Status: AC
Start: 2014-07-28 — End: ?
  Filled 2014-07-28: qty 2

## 2014-07-28 MED ORDER — ONDANSETRON HCL 4 MG/2ML IJ SOLN
INTRAMUSCULAR | Status: DC | PRN
Start: 2014-07-28 — End: 2014-07-28
  Administered 2014-07-28: 4 mg via INTRAVENOUS

## 2014-07-28 MED ORDER — HYDROMORPHONE HCL 1 MG/ML IJ SOLN
0.5000 mg | INTRAMUSCULAR | Status: DC | PRN
Start: 2014-07-28 — End: 2014-07-28

## 2014-07-28 MED ORDER — HYDROCODONE-ACETAMINOPHEN 5-325 MG PO TABS
ORAL_TABLET | ORAL | Status: AC
Start: 2014-07-28 — End: ?
  Filled 2014-07-28: qty 1

## 2014-07-28 MED ORDER — CLINDAMYCIN PHOSPHATE 600 MG/4ML IJ SOLN
INTRAMUSCULAR | Status: AC
Start: 2014-07-28 — End: ?
  Filled 2014-07-28: qty 8

## 2014-07-28 MED ORDER — HEPARIN SODIUM LOCK FLUSH 10 UNIT/ML IV SOLN
INTRAVENOUS | Status: AC
Start: 2014-07-28 — End: ?
  Filled 2014-07-28: qty 5

## 2014-07-28 MED ORDER — BUPIVACAINE HCL (PF) 0.5 % IJ SOLN
INTRAMUSCULAR | Status: AC
Start: 2014-07-28 — End: ?
  Filled 2014-07-28: qty 10

## 2014-07-28 MED ORDER — LACTATED RINGERS IV SOLN
INTRAVENOUS | Status: DC | PRN
Start: 2014-07-28 — End: 2014-07-28

## 2014-07-28 MED ORDER — EPHEDRINE SULFATE 50 MG/ML IJ SOLN
INTRAMUSCULAR | Status: DC | PRN
Start: 2014-07-28 — End: 2014-07-28
  Administered 2014-07-28: 13:00:00 10 mg via INTRAVENOUS
  Administered 2014-07-28: 13:00:00 15 mg via INTRAVENOUS

## 2014-07-28 MED ORDER — PROPOFOL 10 MG/ML IV EMUL
INTRAVENOUS | Status: AC
Start: 2014-07-28 — End: ?
  Filled 2014-07-28: qty 20

## 2014-07-28 MED ORDER — ONDANSETRON HCL 4 MG/2ML IJ SOLN
4.0000 mg | Freq: Once | INTRAMUSCULAR | Status: DC | PRN
Start: 2014-07-28 — End: 2014-07-28

## 2014-07-28 MED ORDER — BUPIVACAINE HCL (PF) 0.5 % IJ SOLN
INTRAMUSCULAR | Status: DC | PRN
Start: 2014-07-28 — End: 2014-07-28
  Administered 2014-07-28: 5 mL

## 2014-07-28 MED ORDER — ONDANSETRON HCL 4 MG/2ML IJ SOLN
INTRAMUSCULAR | Status: AC
Start: 2014-07-28 — End: ?
  Filled 2014-07-28: qty 2

## 2014-07-28 MED ORDER — PHENYLEPHRINE 100 MCG/ML IV BOLUS (ANESTHESIA)
PREFILLED_SYRINGE | INTRAVENOUS | Status: AC
Start: 2014-07-28 — End: ?
  Filled 2014-07-28: qty 5

## 2014-07-28 MED ORDER — LIDOCAINE-EPINEPHRINE 1 %-1:100000 IJ SOLN
INTRAMUSCULAR | Status: DC | PRN
Start: 2014-07-28 — End: 2014-07-28
  Administered 2014-07-28: 5 mL

## 2014-07-28 MED ORDER — MIDAZOLAM HCL 2 MG/2ML IJ SOLN
INTRAMUSCULAR | Status: DC | PRN
Start: 2014-07-28 — End: 2014-07-28
  Administered 2014-07-28: 2 mg via INTRAVENOUS

## 2014-07-28 MED ORDER — LACTATED RINGERS IV SOLN
INTRAVENOUS | Status: DC
Start: 2014-07-28 — End: 2014-07-28

## 2014-07-28 MED ORDER — SODIUM CHLORIDE 0.9 % IR SOLN
Status: DC | PRN
Start: 2014-07-28 — End: 2014-07-28
  Administered 2014-07-28: 500 mL

## 2014-07-28 MED ORDER — EPHEDRINE SULFATE 50 MG/ML IJ SOLN
INTRAMUSCULAR | Status: AC
Start: 2014-07-28 — End: ?
  Filled 2014-07-28: qty 2

## 2014-07-28 MED ORDER — OXYCODONE HCL 5 MG PO TABS
15.0000 mg | ORAL_TABLET | ORAL | Status: DC | PRN
Start: 2014-07-28 — End: 2018-02-11

## 2014-07-28 SURGICAL SUPPLY — 29 items
CLOTH BEACON TIMEOUT ORANGE (Other) ×3 IMPLANT
CNTNRW LID CLR 8OZ (Suction) ×1
CONTAINER SPEC 8OZ NS SNPON LID TRNLU (Suction) ×2 IMPLANT
DRESSING NON-ADHERING 3 X 16 (Dressing) ×12 IMPLANT
DRESSING TEGADERM 8X12IN (Dressing) ×2
DRESSING TRANSPARENT L12 IN X W8 IN (Dressing) ×2
DRESSING TRANSPARENT L12 IN X W8 IN POLYURETHANE ADHESIVE (Dressing) ×2 IMPLANT
DRESSING TRNS PU TGDRM 12X8IN LF STRL (Dressing) ×2
GLOVE SURG BIOGEL INDIC SZ 7.5 (Glove) ×6 IMPLANT
GLOVE SURG BIOGEL LF SZ8 (Glove) ×3 IMPLANT
GLOVE SURG BIOGEL SZ7.5 (Glove) ×6 IMPLANT
IRRIGATOR SUCTN PUMP/HANDPIECE (Suction) ×3 IMPLANT
PAD ELECTROSRG GRND REM W CRD (Procedure Accessories) ×3 IMPLANT
SOL BETADINE SOLUTION 4 OZ (Prep) ×3 IMPLANT
SOL NACL .9% IRRIG 3000ML ARTH (Irrigation Solutions)
SOL NACL.9% 1000ML IRR NONLTX (Irrigation Solutions) ×1
SOLUTION IRR 0.9% NACL 1000ML LF STRL (Irrigation Solutions) ×1
SOLUTION IRR 0.9% NACL 3L ARTHMTC LF (Irrigation Solutions)
SOLUTION IRRIGATION 0.9% SODIUM CHLORIDE (Irrigation Solutions) ×1 IMPLANT
SOLUTION IRRIGATION 0.9% SODIUM CHLORIDE 1000 ML PLASTIC POUR BOTTLE (Irrigation Solutions) ×1 IMPLANT
SPONGE LAP 18X18IN PREWASH WHT (Sponge) ×1
SPONGE LAP XRAY 18X18IN (Sponge) ×1
SPONGE LAPAROTOMY L18 IN X W18 IN (Sponge) ×1
SPONGE LAPAROTOMY L18 IN X W18 IN PREWASH WHITE (Sponge) ×1 IMPLANT
SYRINGE 20 ML BD LUER-LOK MEDICAL (Syringes, Needles) ×1 IMPLANT
SYRINGE LUER-LOK STERILE 20CC (Syringes, Needles) ×1
SYRINGE MED 20ML LL LF STRL (Syringes, Needles) ×2
TOWEL BLUE STERILE (Procedure Accessories) ×3 IMPLANT
TRAY SKIN DRY SCRUB (Tray) ×3 IMPLANT

## 2014-07-28 NOTE — Op Note (Signed)
Procedure Date: 07/28/2014     Patient Type: A     SURGEON: Higinio Roger MD  ASSISTANT:       ASSISTANT:  Jimmy Picket.     PREOPOERATIVE DIAGNOSIS:  Scalp wound from prior craniotomy.     POSTOPERATIVE DIAGNOSIS:  Scalp wound from prior craniotomy.     TITLE OF PROCEDURE:  Debridement of a 15 x 4 cm scalp wound with complex closure.     ANESTHESIA:   General.     COMPLICATIONS:   None.     SPECIMENS:  None.     ESTIMATED BLOOD LOSS:  20 mL.     INDICATIONS:  The patient is a 48 year old male with history of significant sinus disease  with multiple craniotomies in the past who was recently admitted to the  hospital with recurrent sinus infections with leak intracranially.  For  this reason, IV antibiotics, craniotomy, and reconstruction with  intracranial free flap.  We could not close the wound due to tightness of  the scalp impacting the flow; therefore, the patient had Integra placed and  was kept open to the be closed later on when all the swelling has  settled down and graft was not successful.  Today is the day he is  roughly  3-1/2 to 4 weeks out from prior operation and scalp closures to be  performed today.     DESCRIPTION OF PROCEDURE:  The patient was brought to the surgical suite, placed in supine position.   After administration of adequate anesthesia, he was orally intubated.  The  Head of bed was then elevated and Integra outer layer was removed.  Staples were  removed from entirety of the scalp and the area was prepped and draped with  Betadine.  Next, using a curette, the entirety of the wound was debrided.   Then using a knife, a millimeter layer of skin circumferentially was  excised.  This allowed [place] to have excellent raw skin edges to suture  together.  Bleeding points were controlled using Bovie cautery.  The area  was copiously irrigated with saline and then closure was performed only  after undermining on the posterior flap of the scalp allowed for closure.   Anterior scalp was not  undermined due to the fact that the flap was there  and undermining of scalp was also followed by release of the galea to allow  for advancement of the skin.  This allowed for relaxed closure, which was  performed using 2-0 Vicryl and 3-0 Prolene.  I was present for the entirety  of the operation.  The dressing at the end was placed using antibiotic  ointment and Xeroform, Kerlix, and a sponge and a dressing.     ADDENDUM:      SPECIMENS:   There were no specimens.       DRAINS:  No drains placed.      COMPLICATIONS:  There were no complications.     R3: cad 07/28/14 (merged with dictation [id number])           D:  07/28/2014 13:39 PM by Dr. Higinio Roger, MD ([id number])  T:  07/28/2014 20:57 PM by       Everlean Cherry: [id number]) (Doc ID: [id number])

## 2014-07-28 NOTE — Anesthesia Postprocedure Evaluation (Signed)
The patient is awake or easily arousable.      The patients respirations, and cardiovascular status have been evaluated and deemed stable post op.     Post op nausea, vomiting and pain have been treated and controlled as effectively as possible without compromising the patients respiratory and cardiovascular status.    Please refer to Post Op PACU Documentation for confirmation of attainment of normothermia and adequate hydration status.    There were no obvious anesthetic related complications.

## 2014-07-28 NOTE — Brief Op Note (Signed)
BRIEF OP NOTE    Date Time: 07/28/2014 1:42 PM    Patient Name:   [name] Brown    Date of Operation:   07/28/2014    Providers Performing:   Surgeon(s):  Higinio Roger, MD    Assistant (s):payam Rosalie Doctor    Operative Procedure:   Procedure(s):  DEBRIDEMENT & IRRIGATION, WOUND CLOSURE scalp    Preoperative Diagnosis:   Pre-Op Diagnosis Codes:     * Open wound of scalp, complicated, initial encounter [S01.00XA]    Postoperative Diagnosis:   * No post-op diagnosis entered *    Anesthesia:   Choice    Estimated Blood Loss:    20    Implants:   * No implants in log *        Urine output:   ml        Drains:         Specimens:       Findings:   15x4cm wound    Complications:   none      Signed by: Higinio Roger, MD                                                                              [facility] SURGERY OR

## 2014-07-28 NOTE — H&P (Signed)
ADMISSION HISTORY AND PHYSICAL EXAM    Date Time: 07/28/2014 12:19 PM  Patient Name: [name] Brown  Attending Physician: Higinio Roger, MD    Assessment:   scalp wound s/p craniotomy/free flap    Plan:   Staged closure of scalp wound s/p craniotomy/free flap  [name] Brown is a 48 y.o. male  who presents with prior history of multitude of operations over years (at least 4 prior crani) for horrible sinus disease. Admitted to hosp over a month ago with recurrent infection.  Treatment included IV abx, crani, I&D intracranial and a free flap to bring in healthy tissue.       Full consent was obtained from patient/family.  The risks and complications of surgery and anesthesia have been explained to the patient/family as well as the alternatives.  Risks (including but not limited to) of bleeding, infection, scarring damage to surrounding tissues, flap/procedure failure, need for further surgery, and death were reviewed with the patient and family. There were no assurances or guarantees given to the patient as to the result of the surgical procedure.    History of Presenting Illness:   [name] Brown is a 48 y.o. male who presents to the hospital with prior history of multitude of operations over years (at least 4 prior crani) for horrible sinus disease. Admitted to hosp over a month ago with recurrent infection.  Treatment included IV abx, crani, I&D intracranial and a free flap to bring in healthy tissue.   Plan today is for Staged closure of scalp wound s/p craniotomy/free flap      Past Medical History:     Past Medical History   Diagnosis Date   . Seasonal allergic rhinitis    . HLD (hyperlipidemia)    . Sleep apnea 2000     CPAP on hold due to open wound of head   . History of migraine 1999   . Diabetes mellitus    . 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 PICC (peripherally inserted central catheter) in place      right, site clear, afebrile   . Chronic headache      denied  nausea/vomiting, denied vision change.       Past Surgical History:     Past Surgical History   Procedure Laterality Date   . Craniotomy, removal hematoma, subdural  09/30/2013     Procedure: CRANIOTOMY, REMOVAL HEMATOMA, SUBDURAL;  Surgeon: Madaline Savage, MD;  Location: [facility];  Service: Neurosurgery;  Laterality: N/A;   BI-FRONTAL CRANIOTOMY, FOR  EVACUATION OF RIGHT FRONTAL ABSCESS   . Craniotomy, removal tumor, navigation system Bilateral 07/01/2014     Procedure: CRANIOTOMY, REMOVAL TUMOR;  Surgeon: Madaline Savage, MD;  Location: [facility];  Service: Neurosurgery;  Laterality: Bilateral;  RE-EXPLOR BI-FRONTAL CRANIOTOMY, ABSCESS RESECTION     NO BRAIN LAB   . Endoscopic sinus surgery (fess) Bilateral 07/01/2014     Procedure: ENDOSCOPIC SINUS sphenoidotomy with navigation;  Surgeon: Dorinda Hill, MD;  Location: [facility];  Service: ENT;  Laterality: Bilateral;  Scope   . Reconstruction, free flap major Left 07/01/2014     Procedure: RECONSTRUCTION, FREE FLAP MAJOR;  Surgeon: Higinio Roger, MD;  Location: [facility];  Service: Plastics;  Laterality: Left;  FREE FLAP MUSCLE       Family History:     Family History   Problem Relation Age of Onset   . Stroke Neg Hx  Social History:     History     Social History   . Marital Status: Married     Spouse Name: N/A     Number of Children: N/A   . Years of Education: N/A     Social History Main Topics   . Smoking status: Light Tobacco Smoker -- 0.25 packs/day for 20 years     Types: Cigars   . Smokeless tobacco: Never Used      Comment: quit 2006; rare cigars since then   . Alcohol Use: Yes      Comment: rarely   . Drug Use: No   . Sexual Activity: Not on file     Other Topics Concern   . Not on file     Social History Narrative       Allergies:     Allergies   Allergen Reactions   . Morphine      Pt stated the medication "burned" when given through an IV   . Penicillins       "I have never taken it, but I am allergic to it":  Tolerating  cefepime August/September 2015       Medications:     Prescriptions prior to admission   Medication Sig   . aspirin 325 MG tablet Take 1 tablet (325 mg total) by mouth daily.   Marland Kitchen ceFEPime (MAXIPIME) 2 G injection Inject 2 g into the muscle every 8 (eight) hours.   . gabapentin (NEURONTIN) 300 MG capsule Take 1 capsule (300 mg total) by mouth every 8 (eight) hours. (Patient taking differently: Take 300 mg by mouth 6 (six) times daily. 5-6 times daily  )   . insulin glargine (LANTUS) 100 UNIT/ML injection Inject 38 Units into the skin every morning.   . metFORMIN (GLUCOPHAGE) 1000 MG tablet Take 1 tablet (1,000 mg total) by mouth 2 (two) times daily with meals.   . metroNIDAZOLE (FLAGYL) 500 MG tablet Take 1 tablet (500 mg total) by mouth 3 (three) times daily.   Marland Kitchen oxyCODONE (OXYCONTIN) 10 MG 12 hr tablet Take 1 tablet (10 mg total) by mouth every 12 (twelve) hours.   Marland Kitchen oxyCODONE (ROXICODONE) 5 MG immediate release tablet Take 3 tablets (15 mg total) by mouth every 3 (three) hours as needed.   . simvastatin (ZOCOR) 40 MG tablet Take 1 tablet (40 mg total) by mouth daily with dinner.   Marland Kitchen HYDROmorphone (DILAUDID) 2 MG tablet Take 1 tablet (2 mg total) by mouth every 4 (four) hours as needed.       Review of Systems:   A comprehensive review of systems was: Negative except scalp wound    Physical Exam:     Filed Vitals:    07/28/14 1149   BP: 131/72   Pulse: 76   Temp: 98.2 F (36.8 C)   SpO2: 96%       Intake and Output Summary (Last 24 hours) at Date Time  No intake or output data in the 24 hours ending 07/28/14 1219    Hrt RRR  Lung CTA  Scalp scalp wound healing well with Integra in place    Labs:   Recent CBC No results for input(s): RBC, HGB, HCT, MCV, MCH, MCHC, RDW, MPV, PLT in the last 24 hours.    Invalid input(s): WBCIR,  NRBCA,  REFLX,  ANRBA  Recent BMP No results for input(s): GLU, BUN, CREAT, CA, NA, K, CL, CO2 in the last 24 hours.    Invalid input(s): AGAP  Recent PT/PTT No results for input(s): INR,  PTT in the last 24 hours.    Invalid input(s): PTI, COUM, COUMP, ACOAG, ACOAP    Signed by: Higinio Roger, MD

## 2014-07-28 NOTE — Discharge Instructions (Addendum)
IN THE EVENT OF ANY FEVER, SEVERE PAIN NOT RELIEVED BY YOUR PAIN MEDICATION, SWELLING, COMPLICATIONS, OR OTHER CONCERNS OR QUESTIONS, PLEASE CONTACT THE OFFICE IMMEDIATELY ([phone]).  THE OFFICE IS OPEN FOR CALLS BETWEEN 9:00 AM AND 5:00 PM.  AFTER HOURS, THE SERVICE WILL CONTACT THE DOCTOR.      DIET   Start off with a light meal (no spicy or heavy foods).  Soup and crackers are suggested for the first meal, then advance to your regular diet as tolerated.  Avoid constipation by drinking plenty of water.  Drink plenty of fluids. Vitamin supplementation with Vitamin C and a Multivitamin tablet may promote healing after surgery.  Constipation frequently occurs as a result of a reduction in your activities.  Stool softeners such as Colace (available over-the counter at your pharmacy) and bulk former such as Metamucil may be started prior to surgery to normalize your bowel habits.  If you need it, Milk of Magnesia is a gentle laxative.     SLEEP/REST    When you first get home, it is important to rest a lot.  Sleep on back with your head elevated and 1 to 2 pillows under the legs for abdominal comfort.     GARMENTS AND DRESSINGS    All dressings are to  removed and changed 48hrs after you are discharge from the recovery room or hospital.  Once removed, perform routine hygiene with soap and water.  May showers after 48hrs (with drains if present). Replace dressings with gauze pads.  Leave the steri strips (tapes across the incisions) in place until they start to loosen, then remove them.  If the steri stripes cause irritation or a blister, remove immediately.       SMOKING    If you are a smoker you should stop smoking to encourage healing, at least for 6 weeks after surgery.  All patients should avoid smokers.    ACTIVITY    Do not stretch, bend or lift heavy items.  No strenuous activity, no lifting more than 5lbs for two weeks.  Further instructions will be given on followup.    TRAVEL    We ask that you remain  within reasonable traveling distance of the office for seven days.    Please keep the office informed of your postoperative telephone number and address.    Do not return to work or drive until follow up appointment or until otherwise stated by your doctor.    MEDICATIONS    You may resume all home (prior to surgery) medications.      Anti-emetics(if provided)     These medications help to control nausea and vomiting after surgery. Nausea is common after surgery and can be reduced by avoiding citrus drinks and fruits. Start with clear tepid liquids. Dry toast or cracker will help to absorb the acid secreted in the stomach. Avoid fatty and fried foods the first 48 hours no matter how good you feel. Narcotics on an empty stomach also cause nausea and vomiting.    Constipation    You may or may not get constipated from narcotics or inactivity.  Try to drink plenty of fluids and walk around as much as possible.  In the event that you do feel constipated, we recommend trying Milk of Magnesia (MOM), or Pericolace, or Dulcolax tablets. Follow the directions on the bottle.  If that does not provide you with relief, then use a Ducolax Suppository.     PAIN MEDICATION:: (1) VICODIN (Hydrocodone/Acetominophen) taken at 2:10  PM    Discharge Instructions: After Your Surgery  You've just had surgery. During surgery you were given medicine called anesthesia to keep you relaxed and free of pain. After surgery you may have some pain or nausea. This is common. Here are some tips for feeling better and getting well after surgery.    Going home  Your doctor or nurse will show you how to take care of yourself when you go home. He or she will also answer your questions. Have an adult family member or friend drive you home. For the first 24 hours after your surgery:   Do not drive or use heavy equipment.   Do not make important decisions or sign legal papers.   Do not drink alcohol.   Have someone stay with you, if needed. He or she  can watch for problems and help keep you safe.  Be sure to go to all follow-up visits with your doctor. And rest after your surgery for as long as your doctor tells you to.  Coping with pain  If you have pain after surgery, pain medicine will help you feel better. Take it as told, before pain becomes severe. Also, ask your doctor or pharmacist about other ways to control pain. This might be with heat, ice, or relaxation. And follow any other instructions your surgeon or nurse gives you.  Tips for taking pain medicine  To get the best relief possible, remember these points:   Pain medicines can upset your stomach. Taking them with a little food may help.   Most pain relievers taken by mouth need at least 20 to 30 minutes to start to work.   Taking medicine on a schedule can help you remember to take it. Try to time your medicine so that you can take it before starting an activity. This might be before you get dressed, go for a walk, or sit down for dinner.   Constipation is a common side effect of pain medicines. Call your doctor before taking any medicines such as laxatives or stool softeners to help ease constipation. Also ask if you should skip any foods. Drinkinglots of fluids andeating foodssuch as fruits and vegetables that are high in fiber can also help. Remember, do not take laxatives unless your surgeon has prescribed them.   Drinking alcohol and taking pain medicine can cause dizziness and slow your breathing. It can even be deadly. Do not drink alcohol while taking pain medicine.   Pain medicine can make you react more slowly to things. Do not drive or run machinery while taking pain medicine.  Your health care providermay tell you to take acetaminophen to help ease your pain. Ask him or her how much you are supposed to take each day. Acetaminophen or other pain relievers may interact with your prescription medicines or other over-the-counter (OTC) drugs. Some prescription medicines have  acetaminophen and other ingredients.Using both prescription and OTC acetaminophenfor paincan cause you to overdose. Readthe labels on your OTC medicineswith care. This will help youto clearly know the list of ingredients, how much to take, and anywarnings. It may also help you not take too muchacetaminophen.If you have questions or do not understand the information, ask your pharmacist or health care provider to explain it to you before you take the OTC medicine.  Managing nausea  Some people have an upset stomach after surgery. This is often because of anesthesia, pain, or pain medicine, or the stress of surgery. These tips will help you handle nausea  and eat healthy foods as you get better. If you were on a special food plan before surgery, ask your doctor if you should follow it while you get better. These tips may help:   Do not push yourself to eat. Your body will tell you when to eat and how much.   Start off with clear liquids and soup. They are easier to digest.   Next try semi-solid foods, such as mashed potatoes, applesauce, and gelatin, as you feel ready.   Slowly move to solid foods. Don't eat fatty, rich, or spicy foods at first.   Do not force yourself to have 3 large meals a day. Instead eat smaller amounts more often.   Take pain medicines with a small amount of solid food, such as crackers or toast, to avoid nausea.         If you have obstructive sleep apnea  You were given anesthesia medicine during surgery to keep you comfortable and free of pain. After surgery, you may have more apnea spells because of this medicine and other medicines you were given. The spells may last longer than usual.  At home:   Keep using the continuous positive airway pressure (CPAP) device when you sleep. Unless your health care provider tells you not to, use it when you sleep, day or night. CPAP is a common device used to treat obstructive sleep apnea.   Talk with your provider before taking any  pain medicine, muscle relaxants, or sedatives. Your provider will tell you about the possible dangers of taking these medicines.   [address] [employer] [address], [place], [place] [address]. All rights reserved. This information is not intended as a substitute for professional medical care. Always follow your healthcare professional's instructions.

## 2014-07-28 NOTE — Transfer of Care (Signed)
Anesthesia Transfer of Care Note    Patient: [name] Brown    Procedures performed: Procedure(s) with comments:  DEBRIDEMENT & IRRIGATION, WOUND CLOSURE - PREP & CLOSURE OF SCALP WOUND    Anesthesia type: General LMA    Patient location:Phase I PACU    Last vitals:   Filed Vitals:    07/28/14 1149   BP: 131/72   Pulse: 76   Temp: 36.8 C (98.2 F)   SpO2: 96%       Post pain: Patient not complaining of pain, continue current therapy      Mental Status:sedated    Respiratory Function: tolerating nasal cannula    Cardiovascular: stable    Nausea/Vomiting: patient not complaining of nausea or vomiting    Hydration Status: adequate    Post assessment: no apparent anesthetic complications

## 2014-07-29 ENCOUNTER — Encounter: Payer: Self-pay | Admitting: Plastic and Reconstructive Surgery

## 2014-10-16 ENCOUNTER — Other Ambulatory Visit: Payer: Self-pay | Admitting: Neurological Surgery

## 2014-10-16 DIAGNOSIS — G939 Disorder of brain, unspecified: Secondary | ICD-10-CM

## 2014-10-17 ENCOUNTER — Ambulatory Visit: Payer: BLUE CROSS/BLUE SHIELD | Attending: Neurological Surgery

## 2014-10-17 DIAGNOSIS — G060 Intracranial abscess and granuloma: Secondary | ICD-10-CM | POA: Insufficient documentation

## 2014-10-17 DIAGNOSIS — R930 Abnormal findings on diagnostic imaging of skull and head, not elsewhere classified: Secondary | ICD-10-CM | POA: Insufficient documentation

## 2014-10-17 DIAGNOSIS — G9389 Other specified disorders of brain: Secondary | ICD-10-CM | POA: Insufficient documentation

## 2014-10-17 DIAGNOSIS — G939 Disorder of brain, unspecified: Secondary | ICD-10-CM

## 2014-10-17 MED ORDER — GADOBUTROL 1 MMOL/ML IV SOLN
10.0000 mL | Freq: Once | INTRAVENOUS | Status: AC | PRN
Start: 2014-10-17 — End: 2014-10-17
  Administered 2014-10-17: 10 mmol via INTRAVENOUS
  Filled 2014-10-17: qty 10

## 2015-05-13 ENCOUNTER — Other Ambulatory Visit: Payer: Self-pay | Admitting: Neurological Surgery

## 2015-05-13 DIAGNOSIS — G060 Intracranial abscess and granuloma: Secondary | ICD-10-CM

## 2015-05-15 ENCOUNTER — Ambulatory Visit: Payer: BLUE CROSS/BLUE SHIELD | Attending: Neurological Surgery

## 2015-05-15 DIAGNOSIS — Z9889 Other specified postprocedural states: Secondary | ICD-10-CM | POA: Insufficient documentation

## 2015-05-15 DIAGNOSIS — G060 Intracranial abscess and granuloma: Secondary | ICD-10-CM | POA: Insufficient documentation

## 2015-05-15 MED ORDER — GADOBUTROL 1 MMOL/ML IV SOLN
10.0000 mL | Freq: Once | INTRAVENOUS | Status: AC | PRN
Start: 2015-05-15 — End: 2015-05-15
  Administered 2015-05-15: 10 mmol via INTRAVENOUS
  Filled 2015-05-15: qty 10

## 2016-04-02 ENCOUNTER — Other Ambulatory Visit: Payer: Self-pay | Admitting: Otolaryngology

## 2016-04-02 DIAGNOSIS — G060 Intracranial abscess and granuloma: Secondary | ICD-10-CM

## 2016-04-04 ENCOUNTER — Ambulatory Visit: Payer: BLUE CROSS/BLUE SHIELD | Attending: Otolaryngology

## 2016-04-04 DIAGNOSIS — Z8661 Personal history of infections of the central nervous system: Secondary | ICD-10-CM | POA: Insufficient documentation

## 2016-04-04 DIAGNOSIS — G060 Intracranial abscess and granuloma: Secondary | ICD-10-CM

## 2016-04-04 DIAGNOSIS — R51 Headache: Secondary | ICD-10-CM | POA: Insufficient documentation

## 2016-04-04 MED ORDER — GADOBUTROL 1 MMOL/ML IV SOLN
10.0000 mL | Freq: Once | INTRAVENOUS | Status: AC | PRN
Start: 2016-04-04 — End: 2016-04-04
  Administered 2016-04-04: 10 mmol via INTRAVENOUS
  Filled 2016-04-04: qty 10

## 2016-04-18 ENCOUNTER — Other Ambulatory Visit: Payer: Self-pay | Admitting: Infectious Disease

## 2016-05-23 ENCOUNTER — Other Ambulatory Visit: Payer: Self-pay | Admitting: Infectious Disease

## 2016-06-27 ENCOUNTER — Other Ambulatory Visit (INDEPENDENT_AMBULATORY_CARE_PROVIDER_SITE_OTHER): Payer: Self-pay | Admitting: Infectious Disease

## 2016-06-27 DIAGNOSIS — G060 Intracranial abscess and granuloma: Secondary | ICD-10-CM

## 2016-06-28 ENCOUNTER — Ambulatory Visit: Payer: BLUE CROSS/BLUE SHIELD | Attending: Infectious Disease

## 2016-06-28 DIAGNOSIS — G060 Intracranial abscess and granuloma: Secondary | ICD-10-CM

## 2016-06-28 MED ORDER — GADOBUTROL 1 MMOL/ML IV SOLN
10.0000 mL | Freq: Once | INTRAVENOUS | Status: AC | PRN
Start: 2016-06-28 — End: 2016-06-28
  Administered 2016-06-28: 10 mmol via INTRAVENOUS
  Filled 2016-06-28: qty 10

## 2016-08-21 ENCOUNTER — Other Ambulatory Visit: Payer: Self-pay

## 2016-08-21 DIAGNOSIS — I671 Cerebral aneurysm, nonruptured: Secondary | ICD-10-CM

## 2016-08-22 ENCOUNTER — Ambulatory Visit: Payer: BLUE CROSS/BLUE SHIELD | Attending: Infectious Disease

## 2016-08-22 DIAGNOSIS — G060 Intracranial abscess and granuloma: Secondary | ICD-10-CM | POA: Insufficient documentation

## 2016-08-22 DIAGNOSIS — I671 Cerebral aneurysm, nonruptured: Secondary | ICD-10-CM

## 2016-08-22 MED ORDER — GADOBUTROL 1 MMOL/ML IV SOLN
10.0000 mL | Freq: Once | INTRAVENOUS | Status: AC | PRN
Start: 2016-08-22 — End: 2016-08-22
  Administered 2016-08-22: 10 mmol via INTRAVENOUS
  Filled 2016-08-22: qty 10

## 2016-08-25 ENCOUNTER — Other Ambulatory Visit (INDEPENDENT_AMBULATORY_CARE_PROVIDER_SITE_OTHER): Payer: Self-pay | Admitting: Infectious Disease

## 2016-08-25 DIAGNOSIS — I671 Cerebral aneurysm, nonruptured: Secondary | ICD-10-CM

## 2018-06-14 ENCOUNTER — Encounter (INDEPENDENT_AMBULATORY_CARE_PROVIDER_SITE_OTHER): Payer: Self-pay | Admitting: "Endocrinology

## 2018-07-23 ENCOUNTER — Encounter (INDEPENDENT_AMBULATORY_CARE_PROVIDER_SITE_OTHER): Payer: Self-pay | Admitting: "Endocrinology

## 2018-07-23 ENCOUNTER — Ambulatory Visit (INDEPENDENT_AMBULATORY_CARE_PROVIDER_SITE_OTHER): Payer: BLUE CROSS/BLUE SHIELD | Admitting: "Endocrinology

## 2018-07-23 DIAGNOSIS — I10 Essential (primary) hypertension: Secondary | ICD-10-CM | POA: Insufficient documentation

## 2018-07-23 DIAGNOSIS — E1165 Type 2 diabetes mellitus with hyperglycemia: Secondary | ICD-10-CM

## 2018-07-23 DIAGNOSIS — Z794 Long term (current) use of insulin: Secondary | ICD-10-CM

## 2018-07-23 DIAGNOSIS — G060 Intracranial abscess and granuloma: Secondary | ICD-10-CM | POA: Insufficient documentation

## 2018-07-23 LAB — LIPID PANEL
Cholesterol / HDL Ratio: 3.6
Cholesterol: 149 mg/dL (ref 0–199)
HDL: 41 mg/dL (ref 40–9999)
LDL Calculated: 90 mg/dL (ref 0–99)
Triglycerides: 91 mg/dL (ref 34–149)
VLDL Calculated: 18 mg/dL (ref 10–40)

## 2018-07-23 LAB — HEMOGLOBIN A1C
Average Estimated Glucose: 128.4 mg/dL
Hemoglobin A1C: 6.1 % — ABNORMAL HIGH (ref 4.6–5.9)

## 2018-07-23 LAB — COMPREHENSIVE METABOLIC PANEL
ALT: 28 U/L (ref 0–55)
AST (SGOT): 26 U/L (ref 5–34)
Albumin/Globulin Ratio: 1.5 (ref 0.9–2.2)
Albumin: 4.4 g/dL (ref 3.5–5.0)
Alkaline Phosphatase: 67 U/L (ref 38–106)
BUN: 14 mg/dL (ref 9.0–28.0)
Bilirubin, Total: 0.4 mg/dL (ref 0.2–1.2)
CO2: 27 mEq/L (ref 21–29)
Calcium: 9.4 mg/dL (ref 8.5–10.5)
Chloride: 103 mEq/L (ref 100–111)
Creatinine: 1 mg/dL (ref 0.5–1.5)
Globulin: 2.9 g/dL (ref 2.0–3.7)
Glucose: 75 mg/dL (ref 70–100)
Potassium: 4.7 mEq/L (ref 3.5–5.1)
Protein, Total: 7.3 g/dL (ref 6.0–8.3)
Sodium: 140 mEq/L (ref 136–145)

## 2018-07-23 LAB — HEMOLYSIS INDEX: Hemolysis Index: 12 (ref 0–18)

## 2018-07-23 LAB — GFR: EGFR: >60

## 2018-07-23 LAB — MICROALBUMIN, RANDOM URINE
Urine Creatinine, Random: 101.6 mg/dL
Urine Microalbumin, Random: 6 (ref 0.0–30.0)
Urine Microalbumin/Creatinine Ratio: 6 ug/mg (ref 0–30)

## 2018-07-23 LAB — TSH: TSH: 3.46 u[IU]/mL (ref 0.35–4.94)

## 2018-07-23 LAB — SEDIMENTATION RATE: Sed Rate: 9 mm/Hr (ref 0–15)

## 2018-07-23 MED ORDER — LIRAGLUTIDE 18 MG/3ML SC SOPN
PEN_INJECTOR | SUBCUTANEOUS | 3 refills | Status: DC
Start: 2018-07-23 — End: 2018-09-16

## 2018-07-23 NOTE — Progress Notes (Signed)
ENDOCRINE  CONSULTATION NOTE    Date Time: 07/23/2018 10:32 AM  Patient Name: [name] Brown    Reason for Referral:     Chief Complaint   Patient presents with   . Diabetes     new pt        History of present Illness:   [name] Brown is a 52 year old male with hx of HTN, hyperlipidemia, DM-2, chronic sinusitis, sleep apnea who was referred for further treatment of diabetes.     Onset DM: 2011  Complications: none that he is aware of.   Control:  Well control overall.   Hospitalization: for hypoglycemia.   Regimen DM: Taking metformin 1000mg  bid, Lantus 55 units twice a day as needed, takes it if BG above 110-120 takes 55 units if below doesn't take- he is doing this now but before use to take it regardless. At night takes insulin at 10pm if above 150. Overall admits that since severe hypoglycemia he is taking lantus once per day most of the time.   has been on insulin since December 2018 after problems with abscess.   Prev meds: glipizide   BG monitoring: On review of BG sometimes even without taking night dose BG seems fine. 100s with occasional excursions to 200s or 60s .   Stability: fluctuations of BG.   Hypoglycemic events: severe episode once April 2019.   Compliance:  Good with monitoring and taking meds.   Recent a1c:  5.6% April 2019. His a1c has been below 6% In a while.   Last ophtalmo evaluation:  Around July 2019- no retinopathy.   Exercise: active walking his dog or though his commute.   Diet: regular diet. Biggest meal lunch and dinner, snack during the day, fruits in the morning.   Symptoms : Does have numbness and tingling left leg but relates to prev surgery, problems with low back pain. Does have polyuria but relates to increase in caffeine intake. He also reports having chronic foot pain.     In regards to his R side foot pain hasn't find any exacerbating factors -cant tell what makes it worse or better, this pain has been chronic.   He is on gabapentin for headache for his chronic hx of sinus problems,  abscess hx. He is on treatment now with Bactrim, had recent MRI.   Smoke and drinks 2-3 beer per night.     Weight fluctuates few lbs.         Review of Systems:   Reviewed of symptoms were done.   Review of Systems   Constitutional: Negative for weight loss.   HENT: Negative for hearing loss and nosebleeds.    Eyes: Negative for blurred vision.   Respiratory: Negative for cough and shortness of breath.    Cardiovascular: Negative for chest pain.   Gastrointestinal: Negative for abdominal pain, constipation, heartburn and vomiting.   Genitourinary: Positive for frequency.   Musculoskeletal: Positive for back pain and joint pain.   Skin: Negative for rash.   Neurological: Positive for sensory change.   Endo/Heme/Allergies: Negative for polydipsia.   Psychiatric/Behavioral: Negative for depression. The patient is not nervous/anxious.        Patient History:     Past Medical History:   Diagnosis Date   . Chronic headache     denied nausea/vomiting, denied vision change.   . 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 Diabetes mellitus    . History of migraine 1999   .  HLD (hyperlipidemia)    . PICC (peripherally inserted central catheter) in place     right, site clear, afebrile   . Seasonal allergic rhinitis    . Sleep apnea 2000    CPAP on hold due to open wound of head       Past Surgical History:   Procedure Laterality Date   . CRANIOTOMY, REMOVAL HEMATOMA, SUBDURAL  09/30/2013    Procedure: CRANIOTOMY, REMOVAL HEMATOMA, SUBDURAL;  Surgeon: Madaline Savage, MD;  Location: [facility];  Service: Neurosurgery;  Laterality: N/A;   BI-FRONTAL CRANIOTOMY, FOR  EVACUATION OF RIGHT FRONTAL ABSCESS   . CRANIOTOMY, REMOVAL TUMOR, NAVIGATION SYSTEM Bilateral 07/01/2014    Procedure: CRANIOTOMY, REMOVAL TUMOR;  Surgeon: Madaline Savage, MD;  Location: [facility];  Service: Neurosurgery;  Laterality: Bilateral;  RE-EXPLOR BI-FRONTAL CRANIOTOMY, ABSCESS RESECTION     NO BRAIN LAB   . DEBRIDEMENT &  IRRIGATION, WOUND CLOSURE N/A 07/28/2014    Procedure: DEBRIDEMENT & IRRIGATION, WOUND CLOSURE;  Surgeon: Higinio Roger, MD;  Location: [facility];  Service: Plastics;  Laterality: N/A;  PREP & CLOSURE OF SCALP WOUND   . ENDOSCOPIC SINUS SURGERY (FESS) Bilateral 07/01/2014    Procedure: ENDOSCOPIC SINUS sphenoidotomy with navigation;  Surgeon: Dorinda Hill, MD;  Location: [facility] [place] [facility];  Service: ENT;  Laterality: Bilateral;  Scope   . RECONSTRUCTION, FREE FLAP MAJOR Left 07/01/2014    Procedure: RECONSTRUCTION, FREE FLAP MAJOR;  Surgeon: Higinio Roger, MD;  Location: [facility] [facility];  Service: Plastics;  Laterality: Left;  FREE FLAP MUSCLE       Family History   Problem Relation Age of Onset   . Stroke Neg Hx        Social History     Socioeconomic History   . Marital status: Married     Spouse name: Not on file   . Number of children: Not on file   . Years of education: Not on file   . Highest education level: Not on file   Occupational History   . Not on file   Social Needs   . Financial resource strain: Not on file   . Food insecurity:     Worry: Not on file     Inability: Not on file   . Transportation needs:     Medical: Not on file     Non-medical: Not on file   Tobacco Use   . Smoking status: Light Tobacco Smoker     Packs/day: 0.25     Years: 20.00     Pack years: 5.00     Types: Cigars   . Smokeless tobacco: Never Used   . Tobacco comment: quit 2006; rare cigars since then   Substance and Sexual Activity   . Alcohol use: Yes     Comment: rarely   . Drug use: No   . Sexual activity: Not on file   Lifestyle   . Physical activity:     Days per week: Not on file     Minutes per session: Not on file   . Stress: Not on file   Relationships   . Social connections:     Talks on phone: Not on file     Gets together: Not on file     Attends religious service: Not on file     Active member of club or organization: Not on file     Attends meetings of clubs or organizations: Not on file  Relationship  status: Not on file   . Intimate partner violence:     Fear of current or ex partner: Not on file     Emotionally abused: Not on file     Physically abused: Not on file     Forced sexual activity: Not on file   Other Topics Concern   . Not on file   Social History Narrative   . Not on file       Allergies   Allergen Reactions   . Morphine      Pt stated the medication "burned" when given through an IV  Other reaction(s): other/intolerance   . Penicillins       "I have never taken it, but I am allergic to it":  Tolerating cefepime August/September 2015  Other reaction(s): unknown       Medications:     Current Outpatient Medications:   .  aspirin 325 MG tablet, Take 1 tablet (325 mg total) by mouth daily., Disp: 30 tablet, Rfl: 0  .  gabapentin (NEURONTIN) 300 MG capsule, Take 1 capsule (300 mg total) by mouth every 8 (eight) hours. (Patient taking differently: Take 300 mg by mouth 6 (six) times daily. 5-6 times daily ), Disp: 90 capsule, Rfl: 0  .  glucose blood test strip, USE TO TEST BLOOD SUGAR 3 TIMES DAILY AND AS NEEDED, Disp: , Rfl:   .  Insulin Pen Needle 31G X 6 MM Misc, Check twice a day., Disp: , Rfl:   .  Lancet Device Misc, Place 1 Device onto the skin, Disp: , Rfl:   .  metFORMIN (GLUCOPHAGE) 1000 MG tablet, Take 1 tablet (1,000 mg total) by mouth 2 (two) times daily with meals., Disp: 60 tablet, Rfl: 0  .  simvastatin (ZOCOR) 40 MG tablet, Take 1 tablet (40 mg total) by mouth daily with dinner., Disp: 30 tablet, Rfl: 0  .  celecoxib (CELEBREX) 100 MG capsule, TAKE 1 CAPSULE BY MOUTH TWICE DAILY WITH FOOD FOR 30 DAYS, Disp: , Rfl: 3  .  insulin glargine (LANTUS) 100 UNIT/ML injection, Inject 38 Units into the skin every morning., Disp: 10 mL, Rfl: 0  .  sulfamethoxazole-trimethoprim (BACTRIM DS,SEPTRA DS) 800-160 MG per tablet, Take 1 tablet by mouth, Disp: , Rfl:   .  traZODone (DESYREL) 50 MG tablet, Take 50 mg by mouth nightly, Disp: , Rfl: 3      Physical Exam:   BP 130/81   Pulse 78   Temp 98.1  F (36.7 C)   Ht 1.74 m (5' 8.5")   Wt 128.4 kg (283 lb)   BMI 42.40 kg/m     General appearance - alert, well appearing, and in no distress. Surgical scar head.   Eyes -  extraocular eye movements intact.  Mouth - mucous membranes moist.  Neck - supple, no significant adenopathy.   Thyroid: thyroid full, no nodules appreciated.   Heart - S1-S2, regular sounds, no heart murmur.   Lungs: CTA, no addition sounds.   Abdomen - soft, nontender, nondistended.  Neurological - alert, oriented, normal speech,.  Psych; normal affect and mood.   Extremities - no pedal edema. Sensation intact to monofilament, no callous nor ulcers, varicose veins. Skin changes chin of legs.         Labs:   07/09/18: creat 0.9, lfts nl, cbc nl    Rads:   none    Assessment:       1. Diabetes mellitus type 2 uncontrolled given his blood  sugar levels  2. Mixed hyperlipidemia  3.essential hypertension  4. Obesity  5.  Foot pain.  6. Alcohol+ smoking behavior.     Plan:     1. Discussed about DM and health implications   2. Rec to monitor BG at least 2-3 times per day. Should bring meter and logbook to  each visit. Freestyle libre sent   3. Change  lantus 50 units hs, stop lantus in the morning. If BG is 80 or below may hold lantus.   4. Keep metformin  5. Start Victoza 0.6mg  increase to 1.2mg  after 1 week if tolerate  6. Cont statin and BP meds.   7. Rev dietary intake for DM patients   8. Discussed about yearly ophtalmo screen as well as feet evaluation   9. Rev hypoglycemic symptoms and how to treat.   10. Decrease alcohol intake.   11. Smoking cessation mention but he is not interested.   12. Weight loss would be of great benefit, decrease caloric intake and add some exercise.   13. Regarding pain it is chronic, recommend to fu with podiatrist for further evaluation.   13. Labs today including CMP, a1c, lipids, UMA, tsh. Sed rate to send result to ID.   14. Will ask Nadyne Coombes to follow up with him in 1-2 weeks    Fu in 4 weeks    Signed  by: Almedia Balls    Addendum, labs reviewed, sent msg through my chart, great labs.   Component      Latest Ref Rng & Units 07/23/2018   Glucose      70 - 100 mg/dL 75   BUN      9.0 - 16.1 mg/dL 09.6   Creatinine      0.5 - 1.5 mg/dL 1.0   Sodium      045 - 145 mEq/L 140   Potassium      3.5 - 5.1 mEq/L 4.7   Chloride      100 - 111 mEq/L 103   Carbon Dioxide, Whole Blood      21 - 29 mEq/L 27   Calcium      8.5 - 10.5 mg/dL 9.4   Protein, Total      6.0 - 8.3 g/dL 7.3   Albumin      3.5 - 5.0 g/dL 4.4   AST Whole Blood      5 - 34 U/L 26   ALT, Whole Blood      0 - 55 U/L 28   Alkaline Phosphatase      38 - 106 U/L 67   Bilirubin, Total      0.2 - 1.2 mg/dL 0.4   Globulin      2.0 - 3.7 g/dL 2.9   Albumin/Globulin Ratio      0.9 - 2.2 1.5   Cholesterol      0 - 199 mg/dL 409   Triglycerides      34 - 149 mg/dL 91   HDL      40 - 8,119 mg/dL 41   Calculated LDL      0 - 99 mg/dL 90   VLDL Cholesterol Cal      10 - 40 mg/dL 18   CHOL/HDL Ratio      See Below 3.6   Urine Microalbumin, Random      0.0 - 30.0 6.0   Creatinine, UR      None Established mg/dL 147.8   Urine Microalbumin/Creatinine Ratio  0 - 30 ug/mg 6   Hemoglobin A1C      4.6 - 5.9 % 6.1 (H)   Average Estimated Glucose      mg/dL 295.6   TSH      2.13 - 4.94 uIU/mL 3.46   Sed Rate      0 - 15 mm/Hr 9

## 2018-07-26 ENCOUNTER — Encounter (INDEPENDENT_AMBULATORY_CARE_PROVIDER_SITE_OTHER): Payer: Self-pay | Admitting: "Endocrinology

## 2018-07-26 ENCOUNTER — Telehealth (INDEPENDENT_AMBULATORY_CARE_PROVIDER_SITE_OTHER): Payer: Self-pay | Admitting: "Endocrinology

## 2018-07-26 MED ORDER — FREESTYLE LIBRE SENSOR SYSTEM MISC
12 refills | Status: DC
Start: 2018-07-26 — End: 2018-08-30

## 2018-07-26 MED ORDER — FREESTYLE LIBRE READER DEVI
0 refills | Status: AC
Start: 2018-07-26 — End: 2019-07-26

## 2018-07-26 NOTE — Telephone Encounter (Signed)
Elena please follow up on this patients Blood sugar in 1 week . thanks

## 2018-08-03 ENCOUNTER — Other Ambulatory Visit (INDEPENDENT_AMBULATORY_CARE_PROVIDER_SITE_OTHER): Payer: Self-pay

## 2018-08-03 NOTE — Progress Notes (Addendum)
Nurse Navigator placed a call to patient to follow up on diabetes management and BG monitoring as per MD referral. Patient answered and reported that he is currently taking victoza 1.2mg  and 50 units of lantus at night. Patient reported that his morning BG levels have ranged between upper 90s - low 100s. His evening levels usually range between 150-200. However last night, his BG level was 109. He requested that NN contact him via My Chart in the future. Left contact information for assistance with questions, concerns, and scheduling.        Nadyne Coombes, RN, BSN, CDE   Patient Care Navigator  [facility]   [address]  [place], [place] [address]  T [phone]  M [phone] F [phone]

## 2018-08-09 ENCOUNTER — Encounter (INDEPENDENT_AMBULATORY_CARE_PROVIDER_SITE_OTHER): Payer: Self-pay | Admitting: "Endocrinology

## 2018-08-25 ENCOUNTER — Encounter (INDEPENDENT_AMBULATORY_CARE_PROVIDER_SITE_OTHER): Payer: Self-pay

## 2018-08-25 ENCOUNTER — Other Ambulatory Visit (INDEPENDENT_AMBULATORY_CARE_PROVIDER_SITE_OTHER): Payer: Self-pay

## 2018-08-25 NOTE — Progress Notes (Signed)
Nurse Navigator sent a my chart message to patient to follow up on diabetes management and BG monitoring as per patient request. Included contact information for assistance with questions, concerns, and scheduling.      Nadyne Coombes, RN, BSN, CDE   Patient Care Navigator  [facility]   [address], [address]  [place], [place] [address]  T [phone]  M [phone] F [phone]

## 2018-08-26 ENCOUNTER — Other Ambulatory Visit (INDEPENDENT_AMBULATORY_CARE_PROVIDER_SITE_OTHER): Payer: Self-pay

## 2018-08-26 NOTE — Progress Notes (Signed)
Patient sent a my chart message requesting 90 day prescription for [facility] and a question regarding his victoza and lantus. Spoke with Dr. Derrell Lolling regarding Rx and questions, and she stated that she will follow up with patient.     Nadyne Coombes, RN, BSN, CDE   Patient Care Navigator  [facility]   [address]  [place], [place] [address]  T [phone]  M [phone] F [phone]

## 2018-08-30 ENCOUNTER — Telehealth (INDEPENDENT_AMBULATORY_CARE_PROVIDER_SITE_OTHER): Payer: Self-pay | Admitting: "Endocrinology

## 2018-08-30 ENCOUNTER — Encounter (INDEPENDENT_AMBULATORY_CARE_PROVIDER_SITE_OTHER): Payer: Self-pay | Admitting: "Endocrinology

## 2018-08-30 MED ORDER — FREESTYLE LIBRE SENSOR SYSTEM MISC
2 refills | Status: DC
Start: 2018-08-30 — End: 2018-08-30

## 2018-08-30 NOTE — Telephone Encounter (Signed)
Reviewed [name] Brown's note-Sending freestyle libre, per request.   Regarding insulin or victoza, needs to let us know BG levels his a1c was 6.1.

## 2018-08-30 NOTE — Progress Notes (Signed)
Pt would like rx sent to [facility]

## 2018-09-01 MED ORDER — FREESTYLE LIBRE SENSOR SYSTEM MISC
2 refills | Status: DC
Start: 2018-09-01 — End: 2018-09-02

## 2018-09-02 ENCOUNTER — Other Ambulatory Visit (INDEPENDENT_AMBULATORY_CARE_PROVIDER_SITE_OTHER): Payer: Self-pay | Admitting: "Endocrinology

## 2018-09-02 MED ORDER — FREESTYLE LIBRE SENSOR SYSTEM MISC
2 refills | Status: DC
Start: 2018-09-02 — End: 2019-04-24

## 2018-09-10 ENCOUNTER — Encounter (INDEPENDENT_AMBULATORY_CARE_PROVIDER_SITE_OTHER): Payer: Self-pay | Admitting: "Endocrinology

## 2018-09-10 ENCOUNTER — Ambulatory Visit (INDEPENDENT_AMBULATORY_CARE_PROVIDER_SITE_OTHER): Payer: BLUE CROSS/BLUE SHIELD | Admitting: "Endocrinology

## 2018-09-10 VITALS — BP 137/84 | HR 80 | Temp 97.4°F | Ht 70.0 in | Wt 285.2 lb

## 2018-09-10 DIAGNOSIS — E1165 Type 2 diabetes mellitus with hyperglycemia: Secondary | ICD-10-CM

## 2018-09-10 DIAGNOSIS — E669 Obesity, unspecified: Secondary | ICD-10-CM

## 2018-09-10 DIAGNOSIS — Z794 Long term (current) use of insulin: Secondary | ICD-10-CM

## 2018-09-10 DIAGNOSIS — I10 Essential (primary) hypertension: Secondary | ICD-10-CM

## 2018-09-10 DIAGNOSIS — E782 Mixed hyperlipidemia: Secondary | ICD-10-CM

## 2018-09-10 NOTE — Progress Notes (Signed)
ENDOCRINE  Progress  NOTE    Date Time: 09/10/2018 2:59 PM  Patient Name: [facility]    Reason for Referral:     Chief Complaint   Patient presents with   . Diabetes     f/u        History of present Illness:   Mr Royston is a 52 year old male with hx of HTN, hyperlipidemia, DM-2, chronic sinusitis, sleep apnea who was referred for further treatment of diabetes.     Onset DM: 2011  Complications: none that he is aware of.   Control:  Well control overall.   Hospitalization: for hypoglycemia.   Regimen DM: Taking metformin 1000mg  bid, Victoza 1.2mg ,  Lantus 60 units hs (on insulin since DEC 2018)..     Prev meds: glipizide   BG monitoring: bg AVERAGE 124  94% within target, 1% below 70  Stability: fluctuations of BG but BG a1c has been within good range  Hypoglycemic events: severe episode once April 2019.   Compliance:  Good with monitoring and taking meds.   Recent a1c: 6.1  Last ophtalmo evaluation:  Around July 2019- no retinopathy.   Exercise: active walking his dog or though his commute.   Diet: regular diet. Biggest meal lunch and dinner, snack during the day, fruits in the morning.   Symptoms : Does have numbness and tingling left leg but relates to prev surgery, problems with low back pain. Does have polyuria but relates to increase in caffeine intake. He also reports having chronic foot pain.     He is on gabapentin for headache for his chronic hx of sinus problems, abscess hx.    Weight is stable.     The following portions of the patient's history were reviewed and updated as appropriate: allergies, current medications, past family history, family  history, past social history, past surgical history and problem list.      Review of Systems:   Reviewed of symptoms were done.   Review of Systems   Constitutional: Negative for weight loss.   HENT: Negative for hearing loss and nosebleeds.    Eyes: Negative for blurred vision.   Respiratory: Negative for cough and shortness of breath.    Cardiovascular:  Negative for chest pain.   Gastrointestinal: Negative for abdominal pain, constipation, heartburn and vomiting.   Genitourinary: Positive for frequency.   Musculoskeletal: Positive for back pain and joint pain.   Skin: Negative for rash.   Neurological: Positive for sensory change.   Endo/Heme/Allergies: Negative for polydipsia.   Psychiatric/Behavioral: Negative for depression. The patient is not nervous/anxious.        Patient History:     Past Medical History:   Diagnosis Date   . Acute renal failure    . Chronic headache     denied nausea/vomiting, denied vision change.   . 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 Diabetes mellitus    . History of migraine 1999   . HLD (hyperlipidemia)    . PICC (peripherally inserted central catheter) in place     right, site clear, afebrile   . Seasonal allergic rhinitis    . Sleep apnea 2000    CPAP on hold due to open wound of head   . Type 2 diabetes mellitus, controlled        Past Surgical History:   Procedure Laterality Date   . CRANIOTOMY, REMOVAL HEMATOMA, SUBDURAL  09/30/2013    Procedure: CRANIOTOMY, REMOVAL  HEMATOMA, SUBDURAL;  Surgeon: Madaline Savage, MD;  Location: [facility];  Service: Neurosurgery;  Laterality: N/A;   BI-FRONTAL CRANIOTOMY, FOR  EVACUATION OF RIGHT FRONTAL ABSCESS   . CRANIOTOMY, REMOVAL TUMOR, NAVIGATION SYSTEM Bilateral 07/01/2014    Procedure: CRANIOTOMY, REMOVAL TUMOR;  Surgeon: Madaline Savage, MD;  Location: [facility];  Service: Neurosurgery;  Laterality: Bilateral;  RE-EXPLOR BI-FRONTAL CRANIOTOMY, ABSCESS RESECTION     NO BRAIN LAB   . DEBRIDEMENT & IRRIGATION, WOUND CLOSURE N/A 07/28/2014    Procedure: DEBRIDEMENT & IRRIGATION, WOUND CLOSURE;  Surgeon: Higinio Roger, MD;  Location: [facility];  Service: Plastics;  Laterality: N/A;  PREP & CLOSURE OF SCALP WOUND   . ENDOSCOPIC SINUS SURGERY (FESS) Bilateral 07/01/2014    Procedure: ENDOSCOPIC SINUS sphenoidotomy with navigation;  Surgeon: Dorinda Hill, MD;  Location: [place] [facility];  Service: ENT;  Laterality: Bilateral;  Scope   . RECONSTRUCTION, FREE FLAP MAJOR Left 07/01/2014    Procedure: RECONSTRUCTION, FREE FLAP MAJOR;  Surgeon: Higinio Roger, MD;  Location: [place] [facility] [facility];  Service: Plastics;  Laterality: Left;  FREE FLAP MUSCLE       Family History   Problem Relation Age of Onset   . Stroke Neg Hx        Social History     Socioeconomic History   . Marital status: Married     Spouse name: Not on file   . Number of children: Not on file   . Years of education: Not on file   . Highest education level: Not on file   Occupational History   . Not on file   Social Needs   . Financial resource strain: Not on file   . Food insecurity:     Worry: Not on file     Inability: Not on file   . Transportation needs:     Medical: Not on file     Non-medical: Not on file   Tobacco Use   . Smoking status: Light Tobacco Smoker     Packs/day: 0.25     Years: 20.00     Pack years: 5.00     Types: Cigars   . Smokeless tobacco: Never Used   . Tobacco comment: quit 2006; rare cigars since then   Substance and Sexual Activity   . Alcohol use: Yes     Comment: rarely   . Drug use: No   . Sexual activity: Not on file   Lifestyle   . Physical activity:     Days per week: Not on file     Minutes per session: Not on file   . Stress: Not on file   Relationships   . Social connections:     Talks on phone: Not on file     Gets together: Not on file     Attends religious service: Not on file     Active member of club or organization: Not on file     Attends meetings of clubs or organizations: Not on file     Relationship status: Not on file   . Intimate partner violence:     Fear of current or ex partner: Not on file     Emotionally abused: Not on file     Physically abused: Not on file     Forced sexual activity: Not on file   Other Topics Concern   . Not on file   Social History Narrative   . Not on file  Allergies   Allergen Reactions   . Morphine      Pt stated the  medication "burned" when given through an IV  Other reaction(s): other/intolerance  Other reaction(s): other/intolerance   . Penicillins       "I have never taken it, but I am allergic to it":  Tolerating cefepime August/September 2015  Other reaction(s): unknown  Other reaction(s): unknown       Medications:     Current Outpatient Medications:   .  aspirin 325 MG tablet, Take 1 tablet (325 mg total) by mouth daily., Disp: 30 tablet, Rfl: 0  .  B Complex-Biotin-FA (B-50 COMPLEX PO), Take by mouth, Disp: , Rfl:   .  celecoxib (CELEBREX) 100 MG capsule, TAKE 1 CAPSULE BY MOUTH TWICE DAILY WITH FOOD FOR 30 DAYS, Disp: , Rfl: 3  .  CINNAMON PO, Take 1,000 mg by mouth, Disp: , Rfl:   .  Continuous Blood Gluc Receiver (FREESTYLE LIBRE READER) Device, To check BG continuously. Change q14 days., Disp: 1 Device, Rfl: 0  .  Continuous Blood Gluc Sensor (FREESTYLE LIBRE SENSOR SYSTEM) Misc, To check BG continuously. Change q14 days., Disp: 6 each, Rfl: 2  .  cyanocobalamin 2000 MCG tablet, Take 2,500 mcg by mouth daily, Disp: , Rfl:   .  cyclobenzaprine (FLEXERIL) 10 MG tablet, Take 10 mg by mouth, Disp: , Rfl:   .  gabapentin (NEURONTIN) 600 MG tablet, Take 600 mg by mouth 4 (four) times daily  , Disp: , Rfl:   .  glucose blood test strip, USE TO TEST BLOOD SUGAR 3 TIMES DAILY AND AS NEEDED, Disp: , Rfl:   .  hydrocortisone 2.5 % cream, Apply topically 2 (two) times daily, Disp: , Rfl:   .  ibuprofen (ADVIL,MOTRIN) 200 MG tablet, Take 200 mg by mouth every 6 (six) hours as needed for Pain, Disp: , Rfl:   .  Insulin Pen Needle 31G X 6 MM Misc, Check twice a day., Disp: , Rfl:   .  Lancet Device Misc, Place 1 Device onto the skin, Disp: , Rfl:   .  liraglutide (VICTOZA) 18 MG/3ML injection, 1.2mg  daily, Disp: 6 mL, Rfl: 3  .  magnesium oxide (MAG-OX) 400 MG tablet, Take 400 mg by mouth daily, Disp: , Rfl:   .  metFORMIN (GLUCOPHAGE) 1000 MG tablet, Take 1 tablet (1,000 mg total) by mouth 2 (two) times daily with meals., Disp:  60 tablet, Rfl: 0  .  simvastatin (ZOCOR) 40 MG tablet, Take 1 tablet (40 mg total) by mouth daily with dinner., Disp: 30 tablet, Rfl: 0  .  sulfamethoxazole-trimethoprim (BACTRIM DS,SEPTRA DS) 800-160 MG per tablet, Take 1 tablet by mouth, Disp: , Rfl:   .  traZODone (DESYREL) 50 MG tablet, Take 50 mg by mouth nightly, Disp: , Rfl: 3  .  insulin glargine (LANTUS) 100 UNIT/ML injection, Inject 38 Units into the skin every morning., Disp: 10 mL, Rfl: 0      Physical Exam:   BP 137/84   Pulse 80   Temp 97.4 F (36.3 C)   Ht 1.778 m (5\' 10" )   Wt 129.4 kg (285 lb 3.2 oz)   BMI 40.92 kg/m     General appearance - alert, well appearing, and in no distress. Surgical scar head.   Eyes -  extraocular eye movements intact.  Mouth - mucous membranes moist.  Neck - supple, no significant adenopathy.   Thyroid: thyroid full, no nodules appreciated.   Heart - S1-S2,  regular sounds, no heart murmur.   Lungs: CTA, no addition sounds.   Abdomen - soft, nontender, nondistended.  Neurological - alert, oriented, normal speech,.  Psych; normal affect and mood.   Extremities - no pedal edema. Sensation intact to monofilament, no callous nor ulcers, varicose veins. Skin changes chin of legs.         Labs:   07/09/18: creat 0.9, lfts nl, cbc nl  07/23/18: a1c 6.1, tsh 3.4, creat 1, lfts nl, tc 149, ldl 90, tgl 91, hdl 41, uma 6    Rads:   none    Assessment:       1. Diabetes mellitus type 2 uncontrolled given his blood sugar levels  2. Mixed hyperlipidemia    4. Obesity  5.  Foot pain.  6. Alcohol+ smoking behavior.     Plan:     1. Increase victoza 1.8mg   2.decraese insulin by 5 units  3. Cont metformin  4. Will do labs in office   5. Cont using freestyle libre.   6. Cont zocor.  7. Work on weight loss through diet and exercise.   8. Smoke cessation needed.       Fu in end of jan 2020      Signed by: Almedia Balls

## 2018-09-13 ENCOUNTER — Encounter (INDEPENDENT_AMBULATORY_CARE_PROVIDER_SITE_OTHER): Payer: Self-pay | Admitting: "Endocrinology

## 2018-09-13 DIAGNOSIS — E669 Obesity, unspecified: Secondary | ICD-10-CM | POA: Insufficient documentation

## 2018-09-13 DIAGNOSIS — E782 Mixed hyperlipidemia: Secondary | ICD-10-CM | POA: Insufficient documentation

## 2018-09-16 ENCOUNTER — Other Ambulatory Visit (INDEPENDENT_AMBULATORY_CARE_PROVIDER_SITE_OTHER): Payer: Self-pay | Admitting: "Endocrinology

## 2018-09-17 MED ORDER — LIRAGLUTIDE 18 MG/3ML SC SOPN
PEN_INJECTOR | SUBCUTANEOUS | 3 refills | Status: DC
Start: 2018-09-17 — End: 2019-09-02

## 2018-12-13 ENCOUNTER — Encounter (INDEPENDENT_AMBULATORY_CARE_PROVIDER_SITE_OTHER): Payer: Self-pay | Admitting: "Endocrinology

## 2018-12-13 ENCOUNTER — Ambulatory Visit (INDEPENDENT_AMBULATORY_CARE_PROVIDER_SITE_OTHER): Payer: BLUE CROSS/BLUE SHIELD | Admitting: "Endocrinology

## 2018-12-13 VITALS — BP 128/88 | HR 84 | Temp 98.1°F | Ht 70.0 in | Wt 274.0 lb

## 2018-12-13 DIAGNOSIS — E1165 Type 2 diabetes mellitus with hyperglycemia: Secondary | ICD-10-CM

## 2018-12-13 DIAGNOSIS — E669 Obesity, unspecified: Secondary | ICD-10-CM

## 2018-12-13 DIAGNOSIS — Z794 Long term (current) use of insulin: Secondary | ICD-10-CM

## 2018-12-13 DIAGNOSIS — I10 Essential (primary) hypertension: Secondary | ICD-10-CM

## 2018-12-13 DIAGNOSIS — E782 Mixed hyperlipidemia: Secondary | ICD-10-CM

## 2018-12-13 LAB — HEMOGLOBIN A1C
Average Estimated Glucose: 125.5 mg/dL
Hemoglobin A1C: 6 % — ABNORMAL HIGH (ref 4.6–5.9)

## 2018-12-13 LAB — HEMOLYSIS INDEX: Hemolysis Index: 16 (ref 0–18)

## 2018-12-13 LAB — COMPREHENSIVE METABOLIC PANEL
ALT: 39 U/L (ref 0–55)
AST (SGOT): 43 U/L — ABNORMAL HIGH (ref 5–34)
Albumin/Globulin Ratio: 1.5 (ref 0.9–2.2)
Albumin: 4 g/dL (ref 3.5–5.0)
Alkaline Phosphatase: 69 U/L (ref 38–106)
BUN: 14 mg/dL (ref 9.0–28.0)
Bilirubin, Total: 0.4 mg/dL (ref 0.2–1.2)
CO2: 28 mEq/L (ref 21–29)
Calcium: 9.1 mg/dL (ref 8.5–10.5)
Chloride: 103 mEq/L (ref 100–111)
Creatinine: 0.9 mg/dL (ref 0.5–1.5)
Globulin: 2.7 g/dL (ref 2.0–3.7)
Glucose: 92 mg/dL (ref 70–100)
Potassium: 4.7 mEq/L (ref 3.5–5.1)
Protein, Total: 6.7 g/dL (ref 6.0–8.3)
Sodium: 141 mEq/L (ref 136–145)

## 2018-12-13 LAB — GFR: EGFR: >60

## 2018-12-13 NOTE — Progress Notes (Signed)
ENDOCRINE  Progress  NOTE    Date Time: 12/13/2018 2:29 PM  Patient Name: [name] Brown    Reason for Referral:     Chief Complaint   Patient presents with   . Diabetes     Type 2         History of present Illness:   [name] Brown is a 53 year old male with hx of HTN, hyperlipidemia, DM-2, chronic sinusitis, sleep apnea who was referred for further treatment of diabetes.     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 Had ct scan and will have mri 2 days from now of brain and orbits.     Onset DM: 2011  Complications: none that he is aware of.   Control:  Well control overall.   Hospitalization: for hypoglycemia.   Regimen DM: Taking metformin 1000mg  bid, Victoza 1.8mg ,  Lantus 53 units hs -decreased dose 2 months ago or so (on insulin since DEC 2018).     Prev meds: glipizide   BG monitoring: bg AVERAGE 111 90 days  96% within target, 2% below 70  Stability: fluctuations of BG but BG a1c has been within good range  Hypoglycemic events: severe episode once April 2019.   Compliance:  Good with monitoring and taking meds.   Recent a1c: 6.1  Last ophtalmo evaluation:  Around July 2019- no retinopathy.   Exercise: active walking his dog or though his commute.   Diet: regular diet. Biggest meal lunch and dinner, snack during the day, fruits in the morning.   Symptoms : Does have numbness and tingling left leg but relates to prev surgery, problems with low back pain. Does have polyuria but relates to increase in caffeine intake. He also reports having chronic foot pain.     He is on gabapentin for headache for his chronic hx of sinus problems, abscess hx.    Weight decreased by 11 lbs    The following portions of the patient's history were reviewed and updated as appropriate: allergies, current medications, past family history, family  history, past social history, past surgical history and problem list.      Review of Systems:   Reviewed of symptoms were done.   Review of Systems   Constitutional: Negative  for weight loss.   HENT: Negative for hearing loss and nosebleeds.    Eyes: Negative for blurred vision.   Respiratory: Negative for cough and shortness of breath.    Cardiovascular: Negative for chest pain.   Gastrointestinal: Negative for abdominal pain, constipation, heartburn and vomiting.   Genitourinary: Positive for frequency.   Musculoskeletal: Positive for back pain and joint pain.   Skin: Negative for rash.   Neurological: Positive for sensory change.   Endo/Heme/Allergies: Negative for polydipsia.   Psychiatric/Behavioral: Negative for depression. The patient is not nervous/anxious.        Patient History:     Past Medical History:   Diagnosis Date   . Acute renal failure    . Chronic headache     denied nausea/vomiting, denied vision change.   . 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 Diabetes mellitus    . History of migraine 1999   . HLD (hyperlipidemia)    . PICC (peripherally inserted central catheter) in place     right, site clear, afebrile   . Seasonal allergic rhinitis    . Sleep apnea 2000    CPAP on hold due to  open wound of head   . Type 2 diabetes mellitus, controlled        Past Surgical History:   Procedure Laterality Date   . CRANIOTOMY, REMOVAL HEMATOMA, SUBDURAL  09/30/2013    Procedure: CRANIOTOMY, REMOVAL HEMATOMA, SUBDURAL;  Surgeon: Madaline Savage, MD;  Location: [facility];  Service: Neurosurgery;  Laterality: N/A;   BI-FRONTAL CRANIOTOMY, FOR  EVACUATION OF RIGHT FRONTAL ABSCESS   . CRANIOTOMY, REMOVAL TUMOR, NAVIGATION SYSTEM Bilateral 07/01/2014    Procedure: CRANIOTOMY, REMOVAL TUMOR;  Surgeon: Madaline Savage, MD;  Location: [facility];  Service: Neurosurgery;  Laterality: Bilateral;  RE-EXPLOR BI-FRONTAL CRANIOTOMY, ABSCESS RESECTION     NO BRAIN LAB   . DEBRIDEMENT & IRRIGATION, WOUND CLOSURE N/A 07/28/2014    Procedure: DEBRIDEMENT & IRRIGATION, WOUND CLOSURE;  Surgeon: Higinio Roger, MD;  Location: [facility];  Service:  Plastics;  Laterality: N/A;  PREP & CLOSURE OF SCALP WOUND   . ENDOSCOPIC SINUS SURGERY (FESS) Bilateral 07/01/2014    Procedure: ENDOSCOPIC SINUS sphenoidotomy with navigation;  Surgeon: Dorinda Hill, MD;  Location: [facility];  Service: ENT;  Laterality: Bilateral;  Scope   . RECONSTRUCTION, FREE FLAP MAJOR Left 07/01/2014    Procedure: RECONSTRUCTION, FREE FLAP MAJOR;  Surgeon: Higinio Roger, MD;  Location:  [facility];  Service: Plastics;  Laterality: Left;  FREE FLAP MUSCLE       Family History   Problem Relation Age of Onset   . Stroke Neg Hx        Social History     Socioeconomic History   . Marital status: Married     Spouse name: Not on file   . Number of children: Not on file   . Years of education: Not on file   . Highest education level: Not on file   Occupational History   . Not on file   Social Needs   . Financial resource strain: Not on file   . Food insecurity:     Worry: Not on file     Inability: Not on file   . Transportation needs:     Medical: Not on file     Non-medical: Not on file   Tobacco Use   . Smoking status: Light Tobacco Smoker     Packs/day: 0.25     Years: 20.00     Pack years: 5.00     Types: Cigars   . Smokeless tobacco: Never Used   . Tobacco comment: quit 2006; rare cigars since then   Substance and Sexual Activity   . Alcohol use: Yes     Comment: daily    . Drug use: No   . Sexual activity: Not on file   Lifestyle   . Physical activity:     Days per week: Not on file     Minutes per session: Not on file   . Stress: Not on file   Relationships   . Social connections:     Talks on phone: Not on file     Gets together: Not on file     Attends religious service: Not on file     Active member of club or organization: Not on file     Attends meetings of clubs or organizations: Not on file     Relationship status: Not on file   . Intimate partner violence:     Fear of current or ex partner: Not on file     Emotionally abused: Not on  file     Physically abused: Not on file      Forced sexual activity: Not on file   Other Topics Concern   . Not on file   Social History Narrative   . Not on file       Allergies   Allergen Reactions   . Morphine      Pt stated the medication "burned" when given through an IV  Other reaction(s): other/intolerance  Other reaction(s): other/intolerance   . Penicillins       "I have never taken it, but I am allergic to it":  Tolerating cefepime August/September 2015  Other reaction(s): unknown  Other reaction(s): unknown       Medications:     Current Outpatient Medications:   .  albuterol (PROVENTIL HFA;VENTOLIN HFA) 108 (90 Base) MCG/ACT inhaler, as needed, Disp: , Rfl:   .  B Complex-Biotin-FA (B-50 COMPLEX PO), Take by mouth, Disp: , Rfl:   .  celecoxib (CELEBREX) 100 MG capsule, TAKE 1 CAPSULE BY MOUTH TWICE DAILY WITH FOOD FOR 30 DAYS, Disp: , Rfl: 3  .  CINNAMON PO, Take 1,000 mg by mouth, Disp: , Rfl:   .  Continuous Blood Gluc Receiver (FREESTYLE LIBRE READER) Device, To check BG continuously. Change q14 days., Disp: 1 Device, Rfl: 0  .  Continuous Blood Gluc Sensor (FREESTYLE LIBRE SENSOR SYSTEM) Misc, To check BG continuously. Change q14 days., Disp: 6 each, Rfl: 2  .  cyclobenzaprine (FLEXERIL) 10 MG tablet, Take 10 mg by mouth, Disp: , Rfl:   .  gabapentin (NEURONTIN) 600 MG tablet, Take 600 mg by mouth 4 (four) times daily  , Disp: , Rfl:   .  glucose blood test strip, USE TO TEST BLOOD SUGAR 3 TIMES DAILY AND AS NEEDED, Disp: , Rfl:   .  hydrocortisone 2.5 % cream, Apply topically 2 (two) times daily, Disp: , Rfl:   .  ibuprofen (ADVIL,MOTRIN) 200 MG tablet, Take 200 mg by mouth every 6 (six) hours as needed for Pain, Disp: , Rfl:   .  insulin glargine (LANTUS) 100 UNIT/ML injection, Inject 38 Units into the skin every morning. (Patient taking differently: Inject into the skin every morning Patient takes 53 units daily ), Disp: 10 mL, Rfl: 0  .  Insulin Pen Needle 31G X 6 MM Misc, Check twice a day., Disp: , Rfl:   .  Lancet Device Misc, Place  1 Device onto the skin, Disp: , Rfl:   .  liraglutide (VICTOZA) 18 MG/3ML injection, 1.8mg  daily, Disp: 27 mL, Rfl: 3  .  magnesium oxide (MAG-OX) 400 MG tablet, Take 400 mg by mouth daily, Disp: , Rfl:   .  metFORMIN (GLUCOPHAGE) 1000 MG tablet, Take 1 tablet (1,000 mg total) by mouth 2 (two) times daily with meals., Disp: 60 tablet, Rfl: 0  .  simvastatin (ZOCOR) 40 MG tablet, Take 1 tablet (40 mg total) by mouth daily with dinner., Disp: 30 tablet, Rfl: 0  .  traZODone (DESYREL) 50 MG tablet, Take 50 mg by mouth nightly, Disp: , Rfl: 3  .  aspirin 325 MG tablet, Take 1 tablet (325 mg total) by mouth daily., Disp: 30 tablet, Rfl: 0  .  cyanocobalamin 2000 MCG tablet, Take 2,500 mcg by mouth daily, Disp: , Rfl:   .  sulfamethoxazole-trimethoprim (BACTRIM DS,SEPTRA DS) 800-160 MG per tablet, Take 1 tablet by mouth, Disp: , Rfl:       Physical Exam:   BP 128/88   Pulse  84   Temp 98.1 F (36.7 C)   Ht 1.778 m (5\' 10" )   Wt 124.3 kg (274 lb)   BMI 39.31 kg/m     General appearance - alert, well appearing, and in no distress. Surgical scar head.   Eyes -  extraocular eye movements intact.  Mouth - mucous membranes moist.  Neck - supple, no significant adenopathy.   Thyroid: thyroid full, no nodules appreciated.   Heart - S1-S2, regular sounds, no heart murmur.   Lungs: CTA, no addition sounds.   Abdomen - soft, nontender, nondistended.  Neurological - alert, oriented, normal speech,.  Psych; normal affect and mood.   Extremities - no pedal edema. Sensation intact to monofilament, no callous nor ulcers, varicose veins. Skin changes chin of legs.         Labs:   07/09/18: creat 0.9, lfts nl, cbc nl  07/23/18: a1c 6.1, tsh 3.4, creat 1, lfts nl, tc 149, ldl 90, tgl 91, hdl 41, uma 6    Rads:   none    Assessment:       1. Diabetes mellitus type 2 uncontrolled given his blood sugar levels  2. Mixed hyperlipidemia    4. Obesity  5.  Foot pain.  6. Alcohol+ smoking behavior.     Plan:     1. Continue  victoza  1.8mg   2.decraese insulin to 50 units daily.   3. Cont metformin  4. Will do labs in office   5. Cont using freestyle libre.   6. Cont zocor.  7. Work on weight loss through diet and exercise.       Fu in 3-4 months.       Signed by: Almedia Balls

## 2018-12-14 ENCOUNTER — Encounter (INDEPENDENT_AMBULATORY_CARE_PROVIDER_SITE_OTHER): Payer: Self-pay | Admitting: "Endocrinology

## 2018-12-16 ENCOUNTER — Encounter (INDEPENDENT_AMBULATORY_CARE_PROVIDER_SITE_OTHER): Payer: Self-pay | Admitting: "Endocrinology

## 2018-12-19 ENCOUNTER — Encounter (INDEPENDENT_AMBULATORY_CARE_PROVIDER_SITE_OTHER): Payer: Self-pay | Admitting: "Endocrinology

## 2018-12-21 ENCOUNTER — Encounter (INDEPENDENT_AMBULATORY_CARE_PROVIDER_SITE_OTHER): Payer: Self-pay | Admitting: "Endocrinology

## 2018-12-28 NOTE — Progress Notes (Signed)
RN faxed most recent imaging, labs, chart notes to Dr. Duanne Moron office per pt request.

## 2018-12-31 ENCOUNTER — Encounter (INDEPENDENT_AMBULATORY_CARE_PROVIDER_SITE_OTHER): Payer: Self-pay

## 2019-01-04 ENCOUNTER — Encounter (INDEPENDENT_AMBULATORY_CARE_PROVIDER_SITE_OTHER): Payer: Self-pay | Admitting: "Endocrinology

## 2019-01-10 ENCOUNTER — Encounter (INDEPENDENT_AMBULATORY_CARE_PROVIDER_SITE_OTHER): Payer: Self-pay | Admitting: "Endocrinology

## 2019-04-08 ENCOUNTER — Ambulatory Visit (INDEPENDENT_AMBULATORY_CARE_PROVIDER_SITE_OTHER): Payer: BLUE CROSS/BLUE SHIELD | Admitting: "Endocrinology

## 2019-04-11 ENCOUNTER — Telehealth (INDEPENDENT_AMBULATORY_CARE_PROVIDER_SITE_OTHER): Payer: Self-pay | Admitting: "Endocrinology

## 2019-04-11 NOTE — Telephone Encounter (Signed)
LM for pt to change 04/15/19 apt to video visit

## 2019-04-13 ENCOUNTER — Encounter (INDEPENDENT_AMBULATORY_CARE_PROVIDER_SITE_OTHER): Payer: Self-pay | Admitting: "Endocrinology

## 2019-04-15 ENCOUNTER — Ambulatory Visit (INDEPENDENT_AMBULATORY_CARE_PROVIDER_SITE_OTHER): Payer: BLUE CROSS/BLUE SHIELD | Admitting: "Endocrinology

## 2019-04-24 ENCOUNTER — Other Ambulatory Visit (INDEPENDENT_AMBULATORY_CARE_PROVIDER_SITE_OTHER): Payer: Self-pay | Admitting: "Endocrinology

## 2019-04-25 MED ORDER — FREESTYLE LIBRE SENSOR SYSTEM MISC
2 refills | Status: DC
Start: 2019-04-25 — End: 2020-02-13

## 2019-08-18 ENCOUNTER — Telehealth (INDEPENDENT_AMBULATORY_CARE_PROVIDER_SITE_OTHER): Payer: BLUE CROSS/BLUE SHIELD | Admitting: "Endocrinology

## 2019-08-18 DIAGNOSIS — I10 Essential (primary) hypertension: Secondary | ICD-10-CM

## 2019-08-18 DIAGNOSIS — Z794 Long term (current) use of insulin: Secondary | ICD-10-CM

## 2019-08-18 DIAGNOSIS — E782 Mixed hyperlipidemia: Secondary | ICD-10-CM

## 2019-08-18 DIAGNOSIS — E1165 Type 2 diabetes mellitus with hyperglycemia: Secondary | ICD-10-CM

## 2019-08-18 DIAGNOSIS — E669 Obesity, unspecified: Secondary | ICD-10-CM

## 2019-08-18 NOTE — Progress Notes (Signed)
TELEVIDEO VISIT              CLINICAL SUMMARY        Verbal Consent      Verbal consent has been obtained from the patient to conduct a video  visit encounter to minimize exposure to COVID-19:yes      Chief Complaint:      Chief Complaint   Patient presents with   . Hyperglycemia         Problem List, Medications, and Allergies reviewed: yes      HPI:    HPI  [name] is a 53 year old male with hx of HTN, hyperlipidemia, DM-2, chronic sinusitis, sleep apnea who was referred for further treatment of diabetes.   Started on Lisinopril.     S/P Eye surgery march 2020 - sinus problems, Streptococcus epidermis-- WAS ON Daptomycin for 6 weeks IV and now on Bactrim. Eyesight improved.     Onset DM: 2011  Complications: none that he is aware of.   Control:  Well control overall.   Hospitalization: for hypoglycemia.   Regimen DM: Taking metformin 1000mg  bid, Victoza 1.8mg ,  Lantus 46units hs ( decreased due to low blood sugars) .    Prev meds: glipizide   BG monitoring: bg AVERAGE  90 days 116, higher BG 12pm to 6pm average 124.   89% within target. Low blood sugars events 22 events in 90 days.   14 days 6 low blood sugar events.     Stability: fluctuations of BG but BG a1c has been within good range  Hypoglycemic events: severe episode once April 2019.   Compliance:  Good with monitoring and taking meds.   Recent a1c: 6.0  Last ophtalmo evaluation:  following  up closely.   Exercise: had been active walking his dog in the past.   Diet: regular diet. Biggest meal lunch and dinner, snack during the day, fruits in the morning.   Symptoms : Does have numbness and tingling left leg but relates to prev surgery, problems with low back pain. Does have polyuria but relates to increase in caffeine intake. He also reports having chronic foot pain.     He is on gabapentin for headache for his chronic hx of sinus problems, abscess hx    Review of Systems  Per hpi    Exam limited due to being a Video visit for Coronavirus.    119/65   Physical Exam  Constitutional:       Appearance: Normal appearance.   HENT:      Head: Normocephalic and atraumatic.      Nose: Nose normal.   Eyes:      Conjunctiva/sclera: Conjunctivae normal.   Pulmonary:      Effort: Pulmonary effort is normal.   Neurological:      Mental Status: He is alert.   Psychiatric:         Mood and Affect: Mood normal.       12/13/2018; a1c 6, creat 0.9, ast 43, alt 39    Assessment/Plan:          1. Type 2 diabetes mellitus with hyperglycemia, with long-term current use of insulin    2. Essential hypertension    3. Mixed hyperlipidemia    4. Obesity (BMI 30-39.9)    Recommendations,   1. Decrease insulin 42 units to avoid hypoglycemia.   2. Had labs order for tsh, lipids, cmp, cbc, a1c, ck, uma. Asked patient to fax [place] BG results once he gets  it.   3. Continue Victoza  4. Continue monitoring blood sugar frrequently.   5. Continue statin.     Return in about 4 months (around 12/19/2019).    Time spent in medical discussion: 11 to 20 minutes     Addendum-= labs reviewed  08/22/2019, labs were done 08/19/2019: a1c 6.4, tsh 5.7, ck 97, cbc nl, creat 1.2. kd 5/Patient actual weight not available., tc 143, ldl 86, tgl 120, albumin/creat 6 nl.    Will inform patient.   Repeat TFts with TPO and ft4 WITH NEXT BW. Will mail.

## 2019-08-22 ENCOUNTER — Encounter (INDEPENDENT_AMBULATORY_CARE_PROVIDER_SITE_OTHER): Payer: Self-pay | Admitting: "Endocrinology

## 2019-08-22 ENCOUNTER — Telehealth (INDEPENDENT_AMBULATORY_CARE_PROVIDER_SITE_OTHER): Payer: Self-pay | Admitting: "Endocrinology

## 2019-08-22 ENCOUNTER — Other Ambulatory Visit (INDEPENDENT_AMBULATORY_CARE_PROVIDER_SITE_OTHER): Payer: Self-pay | Admitting: "Endocrinology

## 2019-08-22 DIAGNOSIS — E038 Other specified hypothyroidism: Secondary | ICD-10-CM

## 2019-08-22 NOTE — Telephone Encounter (Signed)
Called patient, left message on voicemail regarding scheduling a follow up appointment.

## 2019-08-31 ENCOUNTER — Other Ambulatory Visit (INDEPENDENT_AMBULATORY_CARE_PROVIDER_SITE_OTHER): Payer: Self-pay | Admitting: "Endocrinology

## 2020-01-06 ENCOUNTER — Telehealth (INDEPENDENT_AMBULATORY_CARE_PROVIDER_SITE_OTHER): Payer: BLUE CROSS/BLUE SHIELD | Admitting: "Endocrinology

## 2020-01-12 ENCOUNTER — Telehealth (INDEPENDENT_AMBULATORY_CARE_PROVIDER_SITE_OTHER): Payer: BLUE CROSS/BLUE SHIELD

## 2020-01-12 DIAGNOSIS — E1165 Type 2 diabetes mellitus with hyperglycemia: Secondary | ICD-10-CM

## 2020-01-12 DIAGNOSIS — E782 Mixed hyperlipidemia: Secondary | ICD-10-CM

## 2020-01-12 DIAGNOSIS — E039 Hypothyroidism, unspecified: Secondary | ICD-10-CM

## 2020-01-12 DIAGNOSIS — E669 Obesity, unspecified: Secondary | ICD-10-CM

## 2020-01-12 DIAGNOSIS — E038 Other specified hypothyroidism: Secondary | ICD-10-CM | POA: Insufficient documentation

## 2020-01-12 DIAGNOSIS — I10 Essential (primary) hypertension: Secondary | ICD-10-CM

## 2020-01-12 DIAGNOSIS — Z794 Long term (current) use of insulin: Secondary | ICD-10-CM

## 2020-01-12 NOTE — Progress Notes (Signed)
TELEVIDEO VISIT              CLINICAL SUMMARY        Verbal Consent      Verbal consent has been obtained from the patient to conduct a video  visit encounter to minimize exposure to COVID-19:yes      Chief Complaint:      Chief Complaint   Patient presents with   . Diabetes         Problem List, Medications, and Allergies reviewed: yes      HPI:    HPI  [name] is a 54 year old male with hx of HTN, hyperlipidemia, DM-2, chronic sinusitis, sleep apnea being seen for diabetes.   Still having problems with his sinus. Seeing ID,neurologist and neurosurgeon. Had fever the other day and BG 72 overnight.     Onset DM: 2011  Complications: none that he is aware of.   Control:  Well control overall.   Hospitalization: for hypoglycemia.   Regimen DM: Taking metformin 1000mg  bid, Victoza 1.8mg ,  Lantus 40units hs.    Prev meds: glipizide   BG monitoring: bg AVERAGE  30 days 129, average in 7 days was  123. 90 days average 119. Higher in the afternoon but still in the 120s. Scanning about 6 times per day. Time in target  4% for  77-84 which is his target, 94% above target  89% within target. Low blood sugars events 22 events in 90 days.    23 events of low blood sugar since last ov.     Stability: fluctuations of BG but BG a1c has been within good range  Hypoglycemic events: lowest 72 at night.   Compliance:  Good with monitoring and taking meds.   Recent a1c: 6.0  Last ophtalmo evaluation:  following  up closely.   Exercise: had been active walking his dog in the past.   Diet: regular diet.     Symptoms : Does have numbness and tingling left leg but relates to prev surgery, problems with low back pain. Does have polyuria but relates to increase in caffeine intake.    He is on gabapentin for headache for his chronic hx of sinus problems, abscess hx    Re thyroid:   TSh was 01 August 2019.   Hasn't had any prior problems with thyroid .   Hasn't been on thryoid meds..  Used to work in [employer].   Thinks mom  has thyroid problems.   Symptoms: fatigue. Weight problems.   Pertinent neg symptoms: no hoarseness, no dysphagia.     Review of Systems  Per hpi    Exam limited due to being a Video visit for Coronavirus.   130/80  HR 73  Physical Exam  Constitutional:       Appearance: Normal appearance.   HENT:      Head: Normocephalic and atraumatic.      Nose: Nose normal.   Eyes:      Conjunctiva/sclera: Conjunctivae normal.   Pulmonary:      Effort: Pulmonary effort is normal.   Neurological:      Mental Status: He is alert.   Psychiatric:         Mood and Affect: Mood normal.       12/13/2018; a1c 6, creat 0.9, ast 43, alt 39  08/19/2019: a1c 6.4, tsh 5.7, ck 97, creat 1.23, cbc nl, k 5.5, calcium 9.4, lfts nl, tc 143, ldl 86, tgl 120, hdl 35,  rev outside MRI 12/15/2018 increase in  soft tissue bilateral ethmoidectomy intracranially    Assessment/Plan:          1. Type 2 diabetes mellitus with hyperglycemia, with long-term current use of insulin  - Hemoglobin A1C; Future  - Comprehensive metabolic panel; Future  - Lipid panel; Future    2. Mixed hyperlipidemia  - Hemoglobin A1C; Future  - Comprehensive metabolic panel; Future  - Lipid panel; Future    3. Essential hypertension  - Hemoglobin A1C; Future  - Comprehensive metabolic panel; Future  - Lipid panel; Future    4. Obesity (BMI 30-39.9)  - Hemoglobin A1C; Future  - Comprehensive metabolic panel; Future  - Lipid panel; Future    5. Subclinical hypothyroidism  - TSH; Future  - T4, free; Future  - Thyroid peroxidase antibody; Future    Recommendations,   1. He may  decrease insulin 37 units to avoid hypoglycemia given tight blood sugar in the morning  2. Change target on freestyle libre to 80-140.   3. Labs in the system- staff to scan.   4. Continue Victoza  5. Continue monitoring blood sugar frrequently.   6. Continue statin.     Re thyroid  7 . Reviewed subclinical hypothyroidism.   8. Repeat labs and treat as appropriately.       Return in about 3 months (around  04/13/2020).    Time spent in medical discussion: 11 to 20 minutes

## 2020-01-16 ENCOUNTER — Encounter (INDEPENDENT_AMBULATORY_CARE_PROVIDER_SITE_OTHER): Payer: Self-pay

## 2020-02-04 ENCOUNTER — Encounter (INDEPENDENT_AMBULATORY_CARE_PROVIDER_SITE_OTHER): Payer: Self-pay | Admitting: "Endocrinology

## 2020-02-11 ENCOUNTER — Other Ambulatory Visit (INDEPENDENT_AMBULATORY_CARE_PROVIDER_SITE_OTHER): Payer: Self-pay | Admitting: "Endocrinology

## 2020-02-11 DIAGNOSIS — E1165 Type 2 diabetes mellitus with hyperglycemia: Secondary | ICD-10-CM

## 2020-02-11 DIAGNOSIS — Z794 Long term (current) use of insulin: Secondary | ICD-10-CM

## 2020-02-13 NOTE — Telephone Encounter (Signed)
Last OV: 01/12/20  Next OV: 04/20/20    Please review pended rx, thanks!

## 2020-04-20 ENCOUNTER — Telehealth (INDEPENDENT_AMBULATORY_CARE_PROVIDER_SITE_OTHER): Payer: BLUE CROSS/BLUE SHIELD | Admitting: "Endocrinology

## 2020-07-20 ENCOUNTER — Telehealth (INDEPENDENT_AMBULATORY_CARE_PROVIDER_SITE_OTHER): Payer: BLUE CROSS/BLUE SHIELD | Admitting: "Endocrinology

## 2020-08-03 ENCOUNTER — Telehealth (INDEPENDENT_AMBULATORY_CARE_PROVIDER_SITE_OTHER): Payer: Self-pay | Admitting: "Endocrinology

## 2020-08-03 ENCOUNTER — Encounter (INDEPENDENT_AMBULATORY_CARE_PROVIDER_SITE_OTHER): Payer: Self-pay | Admitting: "Endocrinology

## 2020-08-03 ENCOUNTER — Telehealth (INDEPENDENT_AMBULATORY_CARE_PROVIDER_SITE_OTHER): Payer: BLUE CROSS/BLUE SHIELD | Admitting: "Endocrinology

## 2020-08-03 DIAGNOSIS — E1165 Type 2 diabetes mellitus with hyperglycemia: Secondary | ICD-10-CM

## 2020-08-03 DIAGNOSIS — Z794 Long term (current) use of insulin: Secondary | ICD-10-CM

## 2020-08-03 DIAGNOSIS — E782 Mixed hyperlipidemia: Secondary | ICD-10-CM

## 2020-08-03 DIAGNOSIS — E038 Other specified hypothyroidism: Secondary | ICD-10-CM

## 2020-08-03 DIAGNOSIS — I10 Essential (primary) hypertension: Secondary | ICD-10-CM

## 2020-08-03 NOTE — Progress Notes (Signed)
TELEVIDEO VISIT              CLINICAL SUMMARY        Verbal Consent      Verbal consent has been obtained from the patient to conduct a video  visit encounter to minimize exposure to COVID-19:yes      Chief Complaint:      Chief Complaint   Patient presents with   . Diabetes         Problem List, Medications, and Allergies reviewed: yes      HPI:    HPI  [name] is a 54 year old male with hx of HTN, hyperlipidemia, DM-2, chronic sinusitis, sleep apnea being seen for diabetes.   Recently moved.     Had Covid about  1 month ago, had covid shot.    Onset DM: 2011  Complications: none that he is aware of.   Control:  Well control overall.   Hospitalization: for hypoglycemia.   Regimen DM: Taking metformin 1000mg  bid, Victoza 1.8mg ,  Lantus 32units hs.  Decreased few days ago.   Prev meds: glipizide   BG monitoring: bg AVERAGE  30 days , 16 % low, 8% above.   Average 7 days 113, 14 days 108, 30 days 106. 90 days 110   10% below range this past few days   Last 7 days low glucose events 3 in the morning.     Stability: fluctuations of BG but BG a1c has been within good range  Hypoglycemic events: lowest 72 at night.   Compliance:  Good with monitoring and taking meds.   Recent a1c: 6.0  Last ophtalmo evaluation:  following  up closely.   Exercise: had been active walking his dog in the past.   Diet: regular diet.     Symptoms : Does have numbness and tingling left leg but relates to prev surgery, problems with low back pain. Does have polyuria but relates to increase in caffeine intake.  Has been more active lately.   He is on gabapentin for headache for his chronic hx of sinus problems, abscess hx    Re thyroid:   TSh was 01 August 2019.   Hasn't had any prior problems with thyroid .   Hasn't been on thryoid meds..  Used to work in [employer].   Thinks mom has thyroid problems.   Symptoms: fatigue. Weight problems.   Pertinent neg symptoms: no hoarseness, no dysphagia.     Review of Systems  Per  hpi    Exam limited due to being a Video visit for Coronavirus.   There were no vitals taken for this visit.  ? 259 lbs last time checked but doesn't think is that low.     Physical Exam  Constitutional:       Appearance: Normal appearance.   HENT:      Head: Normocephalic and atraumatic.      Nose: Nose normal.   Eyes:      Conjunctiva/sclera: Conjunctivae normal.   Pulmonary:      Effort: Pulmonary effort is normal.   Neurological:      Mental Status: He is alert.   Psychiatric:         Mood and Affect: Mood normal.       12/13/2018; a1c 6, creat 0.9, ast 43, alt 39  08/19/2019: a1c 6.4, tsh 5.7, ck 97, creat 1.23, cbc nl, k 5.5, calcium 9.4, lfts nl, tc 143, ldl 86, tgl 120, hdl 35,  12/2019: a1c 6.5    rev outside MRI 12/15/2018 increase in  soft tissue bilateral ethmoidectomy intracranially    Assessment/Plan:          1. Type 2 diabetes mellitus with hyperglycemia, with long-term current use of insulin  - Hemoglobin A1C; Future  - Comprehensive metabolic panel; Future  - Lipid panel; Future  - Microalbumin, Random Urine (w/creatinine); Future  - TSH; Future  - T4, free; Future  - Thyroid peroxidase antibody; Future  - Thyroglobulin Panel; Future    2. Subclinical hypothyroidism  - Hemoglobin A1C; Future  - Comprehensive metabolic panel; Future  - Lipid panel; Future  - Microalbumin, Random Urine (w/creatinine); Future  - TSH; Future  - T4, free; Future  - Thyroid peroxidase antibody; Future  - Thyroglobulin Panel; Future    3. Mixed hyperlipidemia  - Hemoglobin A1C; Future  - Comprehensive metabolic panel; Future  - Lipid panel; Future  - Microalbumin, Random Urine (w/creatinine); Future  - TSH; Future  - T4, free; Future  - Thyroid peroxidase antibody; Future  - Thyroglobulin Panel; Future    4. Hypertension, unspecified type  - Hemoglobin A1C; Future  - Comprehensive metabolic panel; Future  - Lipid panel; Future  - Microalbumin, Random Urine (w/creatinine); Future  - TSH; Future  - T4, free; Future  -  Thyroid peroxidase antibody; Future  - Thyroglobulin Panel; Future    Recommendations,   1. Change lantus 27 units-- if still having low BGs often further decrease by 4 units.   2. Keep metformin and Victoza.   3. Continue monitoring blood sugar frrequently.   4. Continue statin.     Re thyroid  5 . Reviewed subclinical hypothyroidism.   6. Repeat labs and treat as appropriately.           Return if symptoms worsen or fail to improve. [place]     Time spent in medical discussion: 11 to 20 minutes

## 2020-08-03 NOTE — Telephone Encounter (Signed)
Called patient and left message on voicemail regarding scheduling a follow up appointment.

## 2020-08-03 NOTE — Telephone Encounter (Signed)
Called patient to review her chart for her video visit scheduled for today 08/03/20 at 11 am. Patient did not answer, left voice message for a call back.

## 2020-08-06 ENCOUNTER — Encounter (INDEPENDENT_AMBULATORY_CARE_PROVIDER_SITE_OTHER): Payer: Self-pay

## 2022-01-03 IMAGING — CR FOOT RT 2 VWS
1 series · 2 of 2 positions shown · non-contrast
Comparison: None.

HISTORY/INDICATIONS:   Plantar foot pain for 6 months.
TECHNIQUE: Right foot 2 views, standing.

[Series 1: lat · 0.17mm/px · 2 of 2 slices shown]
[im 1/2]
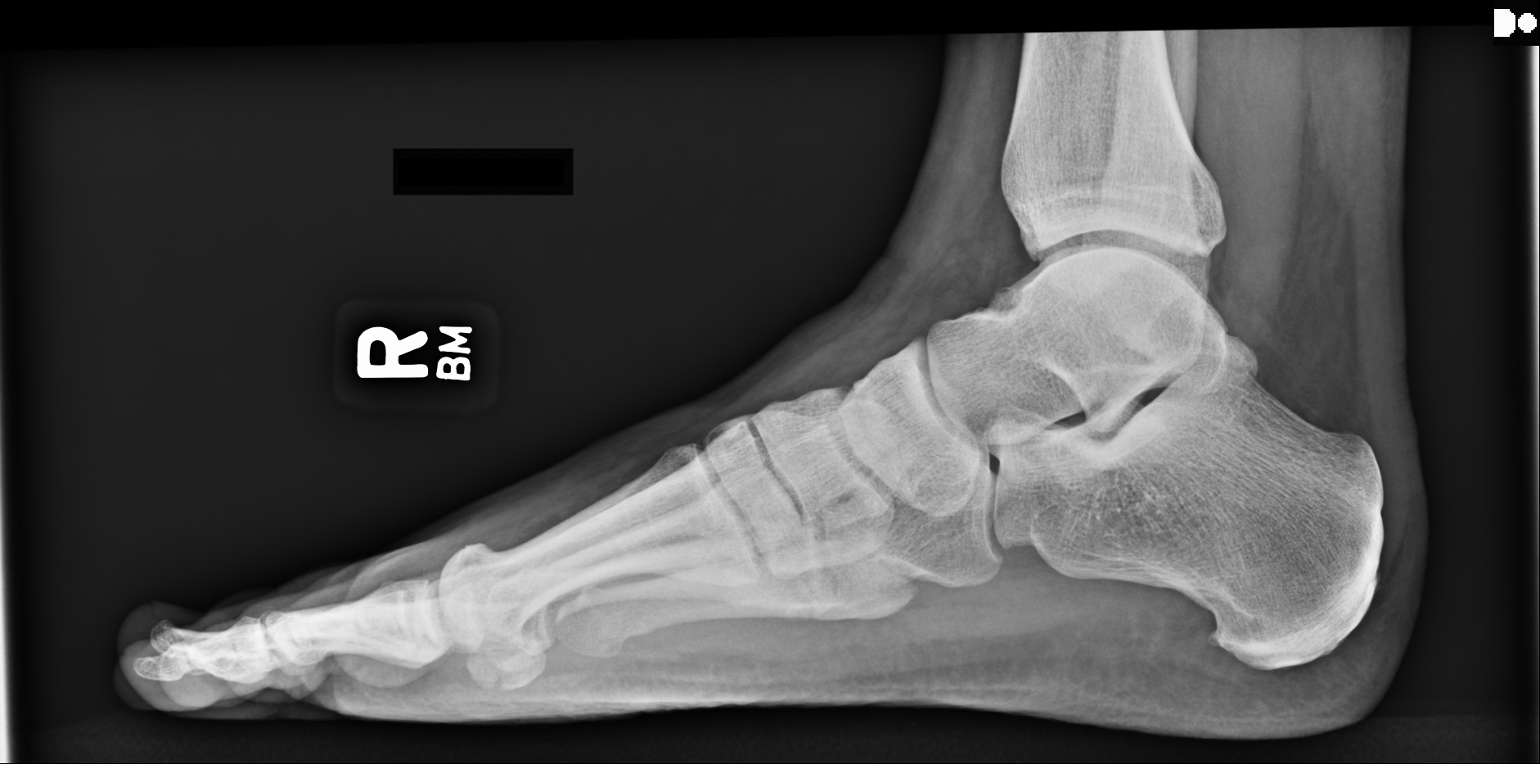
[im 2/2]
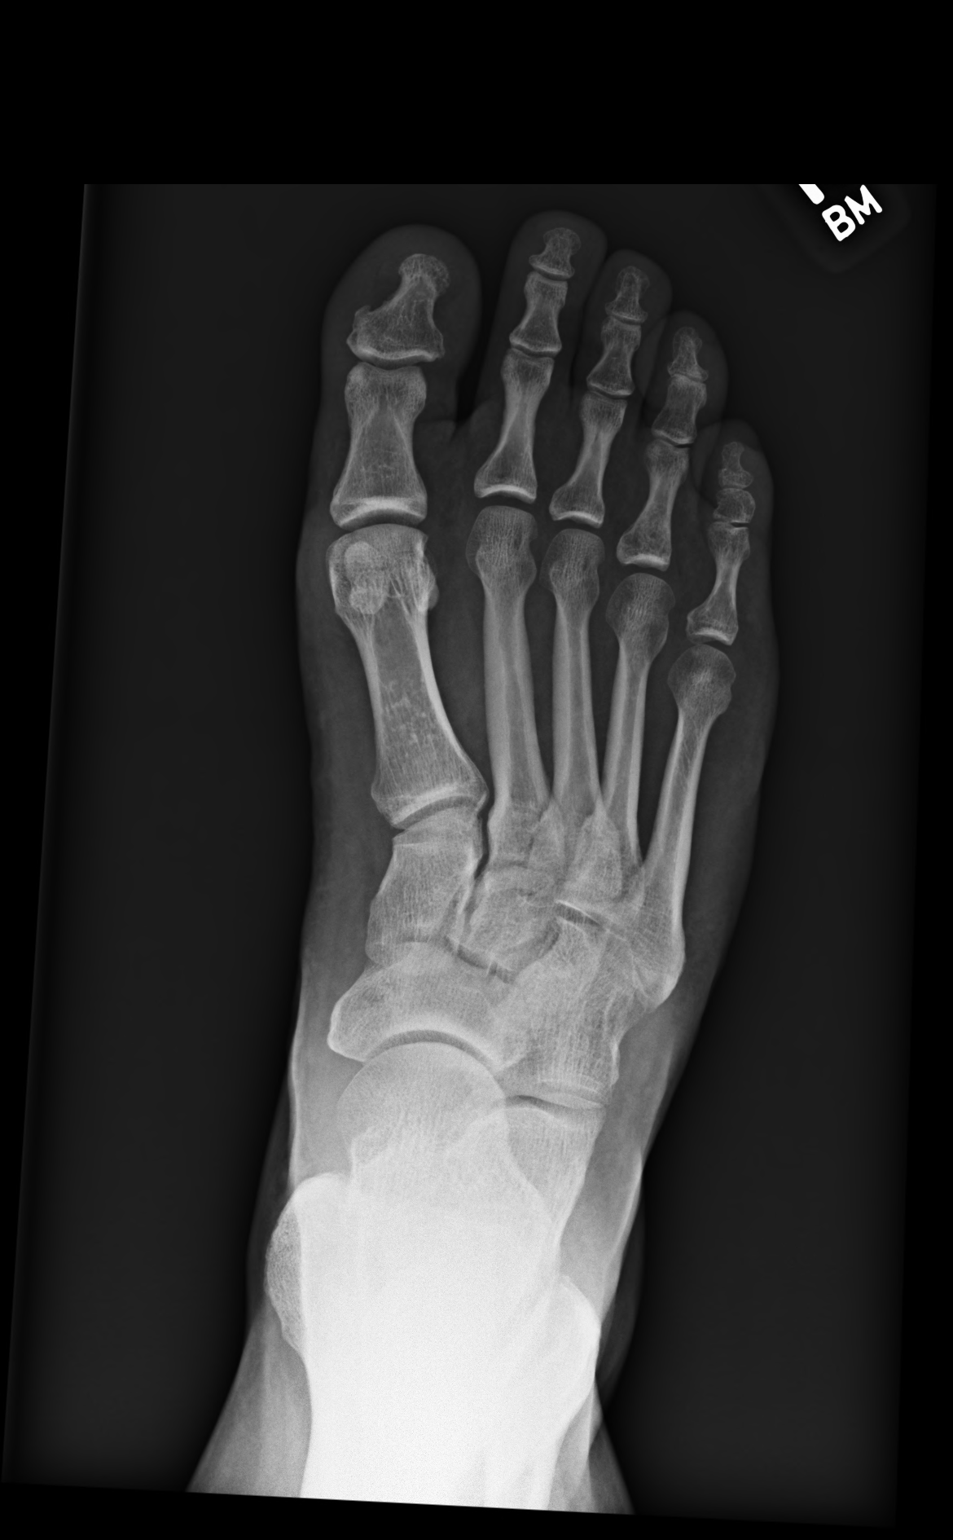

[2 of 2 positions shown; findings below may reference images not displayed]

FINDINGS: Tiny plantar heel spur is seen at the posterior calcaneus. Bipartite medial sesamoid bone of the first MTP joint is noted. There is normal appearance of Bohler's angle indicating no pes planus. In particular, there is no evidence of fracture, dislocation, or radiopaque foreign body. Metatarsal cortical thickness is normal indicating adequate bone density.
IMPRESSION: 1. Tiny plantar heel spur of the posterior calcaneus.

2. Bohler's angle is normal without convincing evidence for pes planus.

3. Incidental bipartite medial sesamoid bone of the first MTP joint. No other significant abnormalities are appreciated.

## 2022-08-01 IMAGING — CR [HOSPITAL] SKULL
1 series · 2 of 2 positions shown · non-contrast
Comparison: MRI of the brain, 02/10/2020.

INDICATION: MRI clearance
TECHNIQUE: 2 views of the skull are obtained.

[Series 1: PA · 0.17mm/px · 2 of 2 slices shown]
[im 1/2]
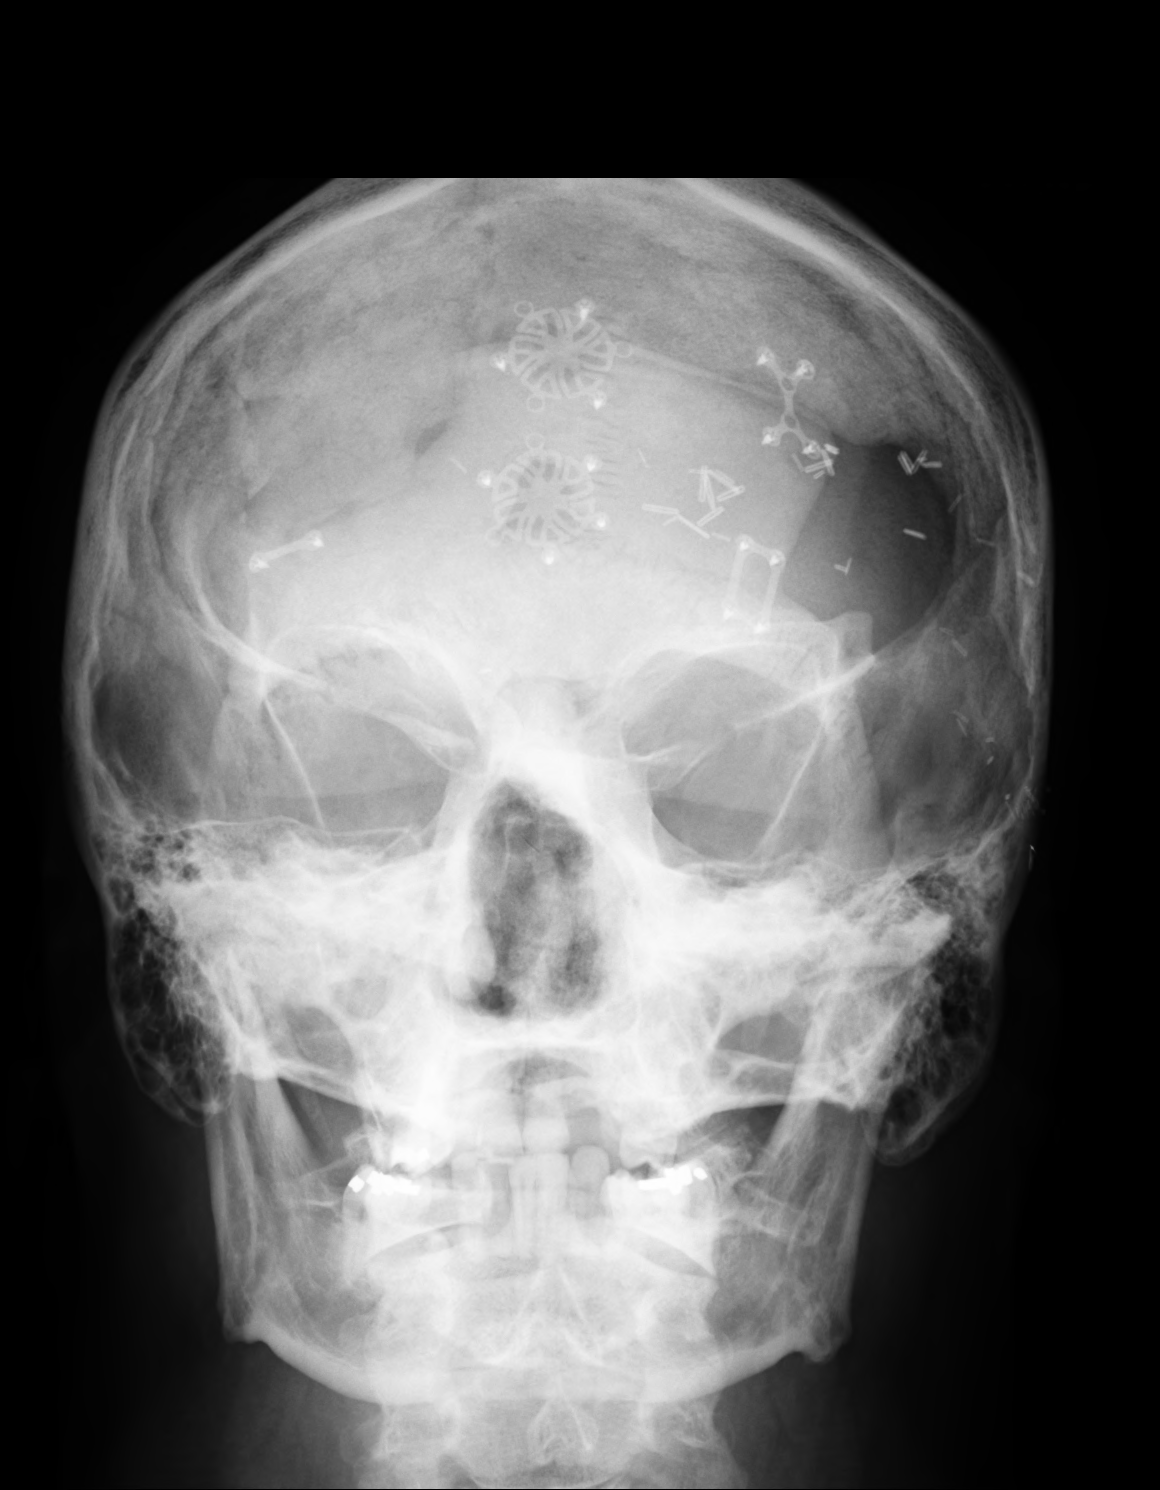
[im 2/2]
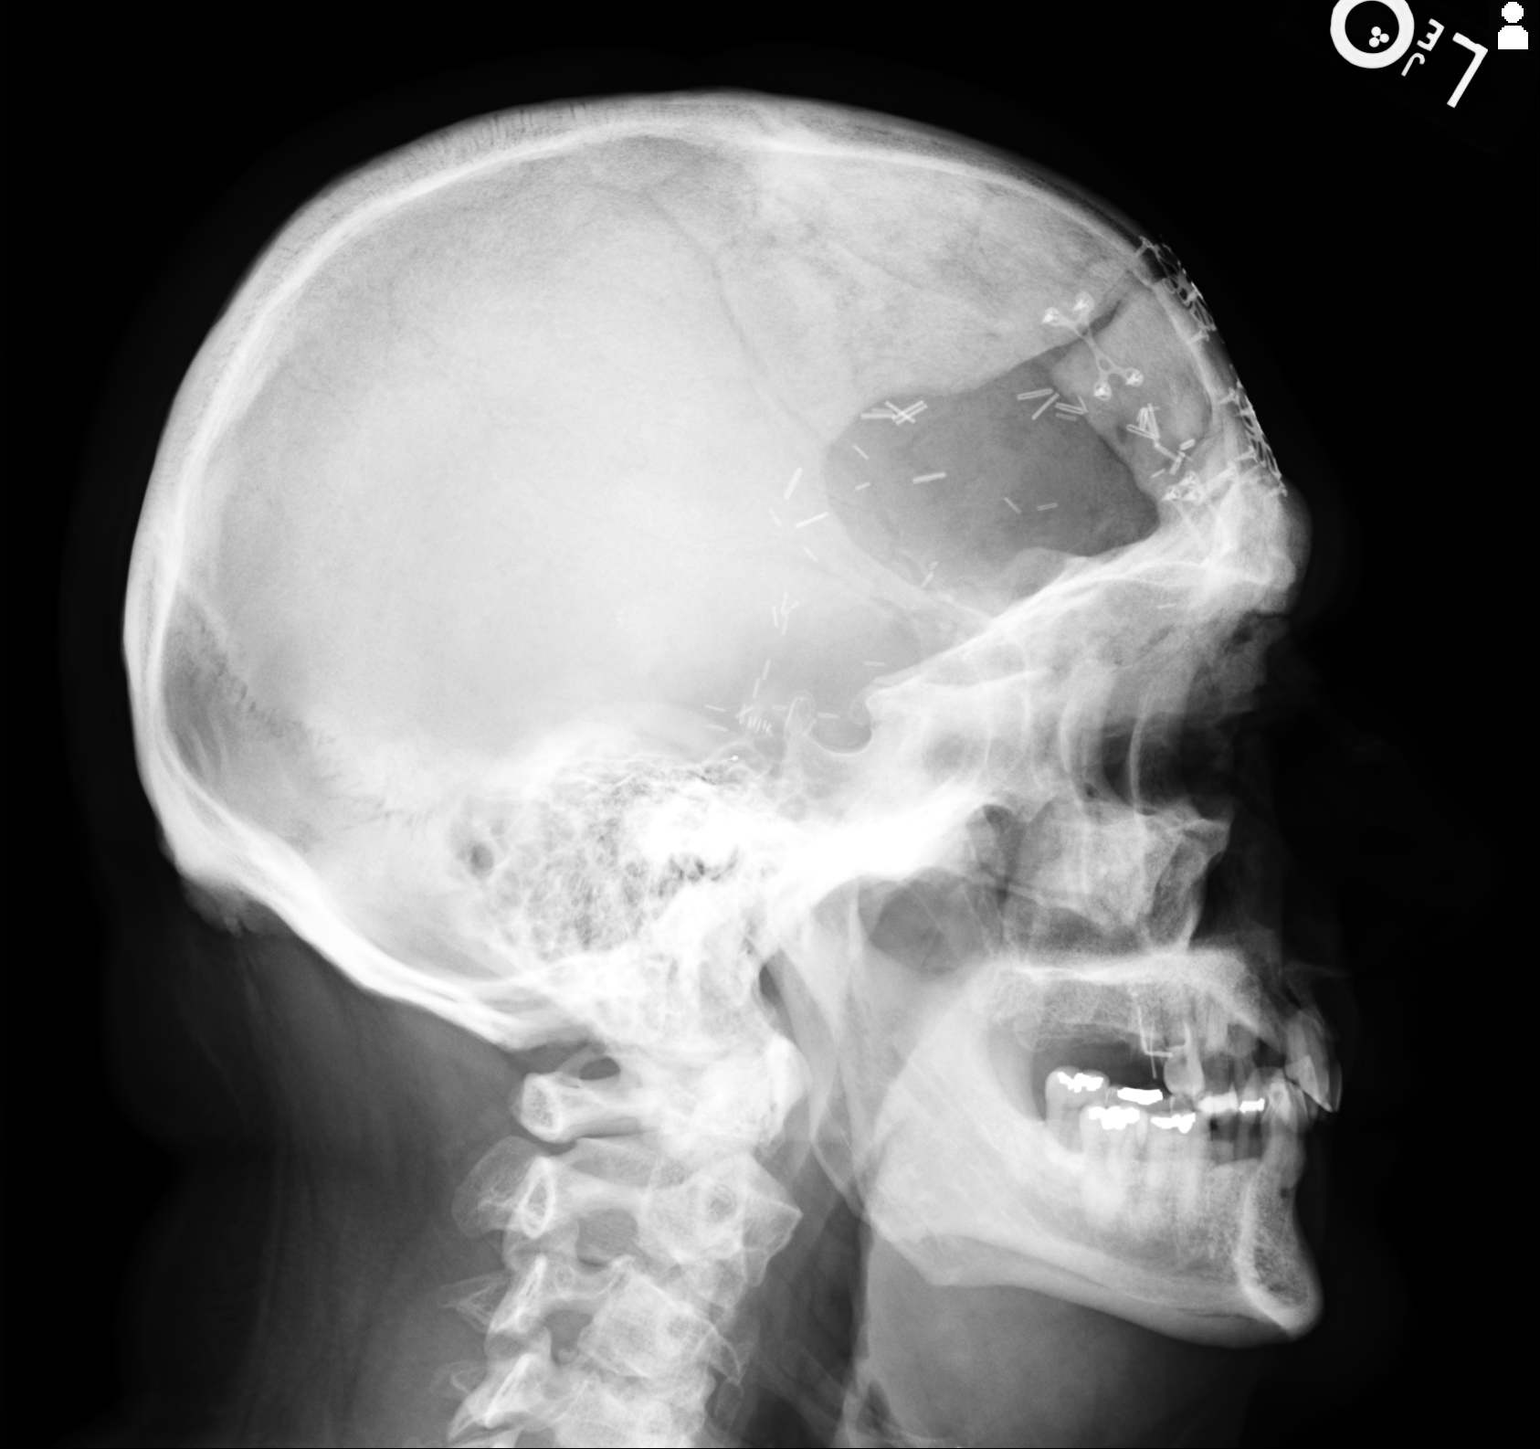

[2 of 2 positions shown; findings below may reference images not displayed]

FINDINGS: Evidence of left frontotemporal craniotomy with multiple surgical ligation clips along the left frontal and temporal bones. No intracranial aneurysm clip is identified. Plate and screw constructs are noted along the frontal bone.
IMPRESSION: Evidence of left frontal temporal craniotomy with multiple surgical ligation clips along the left frontal and temporal bones. No intracranial aneurysm clips.
